# Patient Record
Sex: Female | Born: 1975
Health system: Southern US, Community
[De-identification: ages and names within clinical notes are randomized; demographics above are authoritative.]

## PROBLEM LIST (undated history)

## (undated) DIAGNOSIS — G8929 Other chronic pain: Secondary | ICD-10-CM

## (undated) DIAGNOSIS — I890 Lymphedema, not elsewhere classified: Secondary | ICD-10-CM

## (undated) DIAGNOSIS — I82409 Acute embolism and thrombosis of unspecified deep veins of unspecified lower extremity: Secondary | ICD-10-CM

## (undated) DIAGNOSIS — F329 Major depressive disorder, single episode, unspecified: Secondary | ICD-10-CM

## (undated) DIAGNOSIS — R609 Edema, unspecified: Secondary | ICD-10-CM

## (undated) DIAGNOSIS — J45909 Unspecified asthma, uncomplicated: Secondary | ICD-10-CM

## (undated) DIAGNOSIS — G43909 Migraine, unspecified, not intractable, without status migrainosus: Secondary | ICD-10-CM

## (undated) DIAGNOSIS — D689 Coagulation defect, unspecified: Secondary | ICD-10-CM

## (undated) DIAGNOSIS — D649 Anemia, unspecified: Secondary | ICD-10-CM

## (undated) DIAGNOSIS — G4700 Insomnia, unspecified: Secondary | ICD-10-CM

## (undated) DIAGNOSIS — F32A Depression, unspecified: Secondary | ICD-10-CM

## (undated) DIAGNOSIS — D6851 Activated protein C resistance: Secondary | ICD-10-CM

## (undated) DIAGNOSIS — I959 Hypotension, unspecified: Secondary | ICD-10-CM

## (undated) DIAGNOSIS — F419 Anxiety disorder, unspecified: Secondary | ICD-10-CM

## (undated) DIAGNOSIS — T7840XA Allergy, unspecified, initial encounter: Secondary | ICD-10-CM

## (undated) DIAGNOSIS — M549 Dorsalgia, unspecified: Secondary | ICD-10-CM

## (undated) DIAGNOSIS — E039 Hypothyroidism, unspecified: Secondary | ICD-10-CM

## (undated) DIAGNOSIS — T8859XA Other complications of anesthesia, initial encounter: Secondary | ICD-10-CM

## (undated) DIAGNOSIS — E559 Vitamin D deficiency, unspecified: Secondary | ICD-10-CM

## (undated) DIAGNOSIS — Z889 Allergy status to unspecified drugs, medicaments and biological substances status: Secondary | ICD-10-CM

## (undated) DIAGNOSIS — M1990 Unspecified osteoarthritis, unspecified site: Secondary | ICD-10-CM

## (undated) DIAGNOSIS — K5909 Other constipation: Secondary | ICD-10-CM

## (undated) DIAGNOSIS — G2581 Restless legs syndrome: Secondary | ICD-10-CM

## (undated) DIAGNOSIS — T4145XA Adverse effect of unspecified anesthetic, initial encounter: Secondary | ICD-10-CM

## (undated) HISTORY — DX: Unspecified osteoarthritis, unspecified site: M19.90

## (undated) HISTORY — DX: Anemia, unspecified: D64.9

## (undated) HISTORY — DX: Anxiety disorder, unspecified: F41.9

## (undated) HISTORY — DX: Other constipation: K59.09

## (undated) HISTORY — DX: Insomnia, unspecified: G47.00

## (undated) HISTORY — PX: SPINE SURGERY: SHX786

## (undated) HISTORY — DX: Other chronic pain: G89.29

## (undated) HISTORY — PX: FRACTURE SURGERY: SHX138

## (undated) HISTORY — PX: COLON SURGERY: SHX602

## (undated) HISTORY — DX: Depression, unspecified: F32.A

## (undated) HISTORY — DX: Activated protein C resistance: D68.51

## (undated) HISTORY — DX: Dorsalgia, unspecified: M54.9

## (undated) HISTORY — DX: Unspecified asthma, uncomplicated: J45.909

## (undated) HISTORY — DX: Allergy, unspecified, initial encounter: T78.40XA

## (undated) HISTORY — DX: Coagulation defect, unspecified: D68.9

## (undated) HISTORY — DX: Acute embolism and thrombosis of unspecified deep veins of unspecified lower extremity: I82.409

## (undated) HISTORY — DX: Restless legs syndrome: G25.81

## (undated) HISTORY — DX: Migraine, unspecified, not intractable, without status migrainosus: G43.909

## (undated) HISTORY — DX: Edema, unspecified: R60.9

## (undated) HISTORY — DX: Vitamin D deficiency, unspecified: E55.9

## (undated) HISTORY — PX: COSMETIC SURGERY: SHX468

## (undated) HISTORY — DX: Hypothyroidism, unspecified: E03.9

## (undated) HISTORY — PX: SMALL INTESTINE SURGERY: SHX150

## (undated) HISTORY — DX: Major depressive disorder, single episode, unspecified: F32.9

## (undated) HISTORY — PX: OTHER SURGICAL HISTORY: SHX169

## (undated) MED FILL — Medication: Fill #2 | Status: CN

---
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
## 1998-07-29 DIAGNOSIS — I890 Lymphedema, not elsewhere classified: Secondary | ICD-10-CM | POA: Insufficient documentation

## 2000-07-29 HISTORY — PX: GASTRIC BYPASS: SHX52

## 2002-07-29 HISTORY — PX: ABDOMINOPLASTY: SUR9

## 2002-07-29 HISTORY — PX: OTHER SURGICAL HISTORY: SHX169

## 2005-09-30 ENCOUNTER — Ambulatory Visit: Payer: Self-pay | Admitting: Family Medicine

## 2006-08-27 DIAGNOSIS — G43909 Migraine, unspecified, not intractable, without status migrainosus: Secondary | ICD-10-CM | POA: Insufficient documentation

## 2006-09-23 DIAGNOSIS — G2581 Restless legs syndrome: Secondary | ICD-10-CM | POA: Insufficient documentation

## 2006-12-16 ENCOUNTER — Ambulatory Visit: Payer: Self-pay | Admitting: Internal Medicine

## 2006-12-28 ENCOUNTER — Ambulatory Visit: Payer: Self-pay | Admitting: Internal Medicine

## 2007-01-22 DIAGNOSIS — D509 Iron deficiency anemia, unspecified: Secondary | ICD-10-CM | POA: Insufficient documentation

## 2007-01-27 ENCOUNTER — Ambulatory Visit: Payer: Self-pay | Admitting: Internal Medicine

## 2007-02-27 ENCOUNTER — Ambulatory Visit: Payer: Self-pay | Admitting: Internal Medicine

## 2007-06-15 DIAGNOSIS — J309 Allergic rhinitis, unspecified: Secondary | ICD-10-CM | POA: Insufficient documentation

## 2007-09-03 DIAGNOSIS — F32A Depression, unspecified: Secondary | ICD-10-CM | POA: Insufficient documentation

## 2007-09-03 DIAGNOSIS — F329 Major depressive disorder, single episode, unspecified: Secondary | ICD-10-CM | POA: Insufficient documentation

## 2007-09-03 DIAGNOSIS — G4700 Insomnia, unspecified: Secondary | ICD-10-CM | POA: Insufficient documentation

## 2007-12-25 ENCOUNTER — Ambulatory Visit: Payer: Self-pay | Admitting: Family Medicine

## 2008-07-29 HISTORY — PX: BREAST ENHANCEMENT SURGERY: SHX7

## 2008-09-01 ENCOUNTER — Ambulatory Visit: Payer: Self-pay | Admitting: Family Medicine

## 2009-08-29 ENCOUNTER — Ambulatory Visit: Payer: Self-pay | Admitting: Oncology

## 2009-09-13 ENCOUNTER — Ambulatory Visit: Payer: Self-pay | Admitting: Oncology

## 2009-09-26 ENCOUNTER — Ambulatory Visit: Payer: Self-pay | Admitting: Oncology

## 2009-10-27 ENCOUNTER — Ambulatory Visit: Payer: Self-pay | Admitting: Oncology

## 2009-12-27 ENCOUNTER — Ambulatory Visit: Payer: Self-pay | Admitting: Oncology

## 2010-01-22 ENCOUNTER — Ambulatory Visit: Payer: Self-pay | Admitting: Oncology

## 2010-01-26 ENCOUNTER — Ambulatory Visit: Payer: Self-pay | Admitting: Oncology

## 2010-02-19 ENCOUNTER — Encounter: Payer: Self-pay | Admitting: Family Medicine

## 2010-02-26 ENCOUNTER — Encounter: Payer: Self-pay | Admitting: Family Medicine

## 2010-03-29 ENCOUNTER — Ambulatory Visit: Payer: Self-pay | Admitting: Oncology

## 2010-03-29 ENCOUNTER — Encounter: Payer: Self-pay | Admitting: Family Medicine

## 2010-04-12 ENCOUNTER — Ambulatory Visit: Payer: Self-pay | Admitting: Oncology

## 2010-04-28 ENCOUNTER — Ambulatory Visit: Payer: Self-pay | Admitting: Oncology

## 2010-08-01 ENCOUNTER — Ambulatory Visit: Payer: Self-pay | Admitting: General Practice

## 2010-08-09 ENCOUNTER — Ambulatory Visit: Payer: Self-pay | Admitting: Oncology

## 2010-08-29 ENCOUNTER — Ambulatory Visit: Payer: Self-pay | Admitting: Oncology

## 2011-09-05 HISTORY — PX: BREAST SURGERY: SHX581

## 2012-07-29 HISTORY — PX: ABLATION: SHX5711

## 2012-08-03 ENCOUNTER — Ambulatory Visit: Payer: Self-pay | Admitting: Family Medicine

## 2012-10-15 DIAGNOSIS — Z9884 Bariatric surgery status: Secondary | ICD-10-CM | POA: Insufficient documentation

## 2012-10-15 DIAGNOSIS — D649 Anemia, unspecified: Secondary | ICD-10-CM | POA: Insufficient documentation

## 2012-10-15 DIAGNOSIS — M549 Dorsalgia, unspecified: Secondary | ICD-10-CM | POA: Insufficient documentation

## 2012-10-27 HISTORY — PX: BACK SURGERY: SHX140

## 2012-11-04 ENCOUNTER — Ambulatory Visit: Payer: Self-pay | Admitting: Oncology

## 2012-11-26 ENCOUNTER — Ambulatory Visit: Payer: Self-pay | Admitting: Oncology

## 2012-12-03 ENCOUNTER — Ambulatory Visit: Payer: Self-pay | Admitting: Obstetrics and Gynecology

## 2012-12-03 LAB — BASIC METABOLIC PANEL
Anion Gap: 8 (ref 7–16)
BUN: 16 mg/dL (ref 7–18)
Calcium, Total: 9 mg/dL (ref 8.5–10.1)
Chloride: 105 mmol/L (ref 98–107)
Co2: 26 mmol/L (ref 21–32)
Creatinine: 0.46 mg/dL — ABNORMAL LOW (ref 0.60–1.30)
EGFR (African American): >60
EGFR (Non-African Amer.): >60
Glucose: 77 mg/dL (ref 65–99)
Osmolality: 278 (ref 275–301)
Potassium: 4 mmol/L (ref 3.5–5.1)
Sodium: 139 mmol/L (ref 136–145)

## 2012-12-03 LAB — CBC
HCT: 33.8 % — ABNORMAL LOW (ref 35.0–47.0)
HGB: 11.1 g/dL — ABNORMAL LOW (ref 12.0–16.0)
MCH: 29.4 pg (ref 26.0–34.0)
MCHC: 32.7 g/dL (ref 32.0–36.0)
MCV: 90 fL (ref 80–100)
Platelet: 246 10*3/uL (ref 150–440)
RBC: 3.77 10*6/uL — ABNORMAL LOW (ref 3.80–5.20)
RDW: 19.2 % — ABNORMAL HIGH (ref 11.5–14.5)
WBC: 8.7 10*3/uL (ref 3.6–11.0)

## 2012-12-07 ENCOUNTER — Ambulatory Visit: Payer: Self-pay | Admitting: Obstetrics and Gynecology

## 2012-12-09 LAB — PATHOLOGY REPORT

## 2013-01-06 ENCOUNTER — Encounter: Payer: Self-pay | Admitting: Family Medicine

## 2013-01-20 ENCOUNTER — Ambulatory Visit: Payer: Self-pay | Admitting: Pain Medicine

## 2013-01-22 ENCOUNTER — Other Ambulatory Visit: Payer: Self-pay | Admitting: Pain Medicine

## 2013-01-22 LAB — SEDIMENTATION RATE: Erythrocyte Sed Rate: 14 mm/hr (ref 0–20)

## 2013-01-22 LAB — FERRITIN: Ferritin (ARMC): 7 ng/mL — ABNORMAL LOW (ref 8–388)

## 2013-01-26 ENCOUNTER — Encounter: Payer: Self-pay | Admitting: Family Medicine

## 2013-02-15 ENCOUNTER — Ambulatory Visit: Payer: Self-pay | Admitting: Pain Medicine

## 2013-02-16 ENCOUNTER — Ambulatory Visit: Payer: Self-pay | Admitting: Pain Medicine

## 2013-02-26 ENCOUNTER — Ambulatory Visit: Payer: Self-pay | Admitting: Pain Medicine

## 2013-02-26 ENCOUNTER — Encounter: Payer: Self-pay | Admitting: Family Medicine

## 2013-03-29 ENCOUNTER — Encounter: Payer: Self-pay | Admitting: Family Medicine

## 2013-04-02 ENCOUNTER — Ambulatory Visit: Payer: Self-pay | Admitting: Oncology

## 2013-04-09 ENCOUNTER — Ambulatory Visit: Payer: Self-pay | Admitting: Pain Medicine

## 2013-04-28 ENCOUNTER — Ambulatory Visit: Payer: Self-pay | Admitting: Oncology

## 2013-06-15 ENCOUNTER — Ambulatory Visit: Payer: Self-pay | Admitting: Oncology

## 2013-06-15 LAB — CBC CANCER CENTER
Basophil #: 0.1 x10 3/mm (ref 0.0–0.1)
Basophil %: 0.5 %
Eosinophil #: 0.1 x10 3/mm (ref 0.0–0.7)
Eosinophil %: 1.2 %
HCT: 39.6 % (ref 35.0–47.0)
HGB: 13 g/dL (ref 12.0–16.0)
Lymphocyte #: 3.5 x10 3/mm (ref 1.0–3.6)
Lymphocyte %: 32.5 %
MCH: 33.1 pg (ref 26.0–34.0)
MCHC: 32.8 g/dL (ref 32.0–36.0)
MCV: 101 fL — ABNORMAL HIGH (ref 80–100)
Monocyte #: 0.9 x10 3/mm (ref 0.2–0.9)
Monocyte %: 8.4 %
Neutrophil #: 6.2 x10 3/mm (ref 1.4–6.5)
Neutrophil %: 57.4 %
Platelet: 251 x10 3/mm (ref 150–440)
RBC: 3.92 10*6/uL (ref 3.80–5.20)
RDW: 14.5 % (ref 11.5–14.5)
WBC: 10.8 x10 3/mm (ref 3.6–11.0)

## 2013-06-15 LAB — IRON AND TIBC
Iron Bind.Cap.(Total): 389 ug/dL (ref 250–450)
Iron Saturation: 34 %
Iron: 133 ug/dL (ref 50–170)
Unbound Iron-Bind.Cap.: 256 ug/dL

## 2013-06-15 LAB — PROTIME-INR
INR: 0.9
Prothrombin Time: 12.8 secs (ref 11.5–14.7)

## 2013-06-15 LAB — APTT: Activated PTT: 26.9 secs (ref 23.6–35.9)

## 2013-06-15 LAB — FERRITIN: Ferritin (ARMC): 61 ng/mL (ref 8–388)

## 2013-06-28 ENCOUNTER — Ambulatory Visit: Payer: Self-pay | Admitting: Oncology

## 2013-11-11 DIAGNOSIS — F341 Dysthymic disorder: Secondary | ICD-10-CM | POA: Insufficient documentation

## 2014-11-18 NOTE — Op Note (Signed)
PATIENT NAMLovette Jones:  Wyble, Lovell MR#:  [id number] DATE OF BIRTH:  01-26-1976  DATE OF PROCEDURE:  12/07/2012  PREOPERATIVE DIAGNOSIS:    1.  Menorrhagia.   POSTOPERATIVE DIAGNOSIS: 1.  Menorrhagia.   OPERATIVE PROCEDURE: 1.  Hysteroscopy with dilation and curettage of the endometrium.  2.  NovaSure endometrial ablation.   SURGEON: Prentice DockerMartin A. Jacen Carlini, M.D.   FIRST ASSISTANT: Georgina Quintyler Roper, PA-S.   INDICATIONS: The patient is a 39 year old married white female, para 2-0-0-2, with a long history of menorrhagia, unable to use hormonal contraceptives due to side effects, presents for definitive treatment for this condition.   FINDINGS AT SURGERY:  Reveal a gynecoid pelvis. The uterus was midplane and has a normal size and shape. Uterus is mobile. There were no adnexal masses. The uterus did descend to within 2 cm of the introitus with tenaculum traction. There was a moderate soft tissue dystocia, which made use of a normal weighted speculum difficult.   DESCRIPTION OF PROCEDURE: The patient was brought to the operating room where she was placed in the supine position. The patient was then positioned in the dorsal lithotomy position using the bumblebee stirrups. This was done before the patient was put to sleep because of her chronic back pain issues. Once adequately positioned, general anesthesia was induced using LMA technique without difficulty. A Betadine perineal intravaginal prep and drape was performed in the standard fashion. A red Robinson catheter was used to drain 250 mL of urine from the bladder. A weighted speculum did not adequately stay within the vagina, and therefore a Sims retractor was utilized. The cervix was grasped with a single-tooth tenaculum on the anterior lip of the cervix. The uterus sounded to 10 cm. The cervical length was 4 cm. Hegar dilators up to a #8 JamaicaFrench were used to dilate the endocervical canal. Hysteroscopy was performed with lactated Ringer's being used as irrigant  to identify the intrauterine findings. Once photo documented, the curettage was performed with both smooth and serrated curettes. Stone polyp forceps was used to assist in removing the tissue. Tissue was sent to pathology. Repeat hysteroscopy revealed a good sampling of the endometrium. Next, the NovaSure endometrial ablation instrument was then opened and inserted in using standard technique. The cavity length was 6 cm. The with was noted to be 4.3 cm. Once completing a successful cavity test, the NovaSure instrument was engaged with performance of the ablation procedure. A total of 49 seconds of burn was noted. Upon completion of the procedure, the instrument was  removed. Repeat hysteroscopy demonstrated a good ablation effect. All instrumentation was then removed from the vagina. The patient was then awakened, mobilized and taken to the recovery room in satisfactory condition.   ESTIMATED BLOOD LOSS: Minimal.   COMPLICATIONS:  None.   All instruments, needle and sponge counts were verified as correct.     ____________________________ Prentice DockerMartin A. Herron Fero, MD mad:dmm D: 12/07/2012 13:17:49 ET T: 12/07/2012 21:47:30 ET JOB#: [id number]  cc: Daphine DeutscherMartin A. Ady Heimann, MD, <Dictator> [facility] Prentice DockerMARTIN A Rett Stehlik MD ELECTRONICALLY SIGNED 01/05/2013 15:01

## 2014-11-22 LAB — HM PAP SMEAR: HM Pap smear: NORMAL

## 2015-03-01 DIAGNOSIS — K5909 Other constipation: Secondary | ICD-10-CM | POA: Insufficient documentation

## 2015-03-01 DIAGNOSIS — M4310 Spondylolisthesis, site unspecified: Secondary | ICD-10-CM | POA: Insufficient documentation

## 2015-03-01 DIAGNOSIS — G8929 Other chronic pain: Secondary | ICD-10-CM | POA: Insufficient documentation

## 2015-03-01 DIAGNOSIS — M549 Dorsalgia, unspecified: Secondary | ICD-10-CM

## 2015-03-01 DIAGNOSIS — E559 Vitamin D deficiency, unspecified: Secondary | ICD-10-CM | POA: Insufficient documentation

## 2015-03-01 DIAGNOSIS — E7800 Pure hypercholesterolemia, unspecified: Secondary | ICD-10-CM | POA: Insufficient documentation

## 2015-03-02 ENCOUNTER — Ambulatory Visit: Payer: Self-pay | Admitting: Family Medicine

## 2015-04-04 ENCOUNTER — Other Ambulatory Visit: Payer: Self-pay | Admitting: Family Medicine

## 2015-05-26 ENCOUNTER — Ambulatory Visit (INDEPENDENT_AMBULATORY_CARE_PROVIDER_SITE_OTHER): Payer: 59 | Admitting: Family Medicine

## 2015-05-26 ENCOUNTER — Encounter: Payer: Self-pay | Admitting: Family Medicine

## 2015-05-26 VITALS — BP 108/72 | HR 75 | Temp 99.1°F | Resp 16 | Ht 66.0 in | Wt 211.0 lb

## 2015-05-26 DIAGNOSIS — E559 Vitamin D deficiency, unspecified: Secondary | ICD-10-CM

## 2015-05-26 DIAGNOSIS — R5383 Other fatigue: Secondary | ICD-10-CM | POA: Diagnosis not present

## 2015-05-26 DIAGNOSIS — E039 Hypothyroidism, unspecified: Secondary | ICD-10-CM | POA: Diagnosis not present

## 2015-05-26 DIAGNOSIS — D509 Iron deficiency anemia, unspecified: Secondary | ICD-10-CM

## 2015-05-26 DIAGNOSIS — Z23 Encounter for immunization: Secondary | ICD-10-CM | POA: Diagnosis not present

## 2015-05-26 DIAGNOSIS — Z13220 Encounter for screening for lipoid disorders: Secondary | ICD-10-CM | POA: Diagnosis not present

## 2015-05-26 MED ORDER — PROMETHAZINE HCL 25 MG PO TABS: ORAL_TABLET | ORAL | Status: DC

## 2015-05-26 NOTE — Progress Notes (Signed)
.  [facility] 

## 2015-05-26 NOTE — Progress Notes (Signed)
Patient: Michaela Jones Female    DOB: 07-Mar-1976   39 y.o.   MRN: [id number] Visit Date: 05/26/2015  Today's Provider: Lelon Huh, MD   Chief Complaint  Patient presents with  . Hypothyroidism    follow up  . Abnormal Lab    Vitamin D Deficency and Anemia Follow up   Subjective:    HPI  Hypothyroidism Follow up: Last office visit was 1 year ago and no changes were made. Current treatment includes Levothyroxine 172mg daily. Patient reports good compliance with treatment, and good tolerance.    Vitamin D deficiency Follow up: Last office visit was 1 year ago. Management at that visit includes ordering labs which showed a low Vitamin D level. Patient was advised to start Vitamin D 5,000units daily.   Anemia Follow up: She states she has been much more fatigued lately, but is under some home stresses lately, and allergies have been flaring up. .        Allergies  Allergen Reactions  . Lyrica [Pregabalin] Other (See Comments)    Patient reports neurological problem. Speech disturbances and unable to control motor skills  . Nsaids Other (See Comments)    Avoids due to gastric bypass  . Penicillins Rash   Previous Medications   ALBUTEROL (PROVENTIL) (5 MG/ML) 0.5% NEBULIZER SOLUTION    Inhale into the lungs.   B-COMPLEX TABS    Take by mouth.   BIOTIN 1000 MCG TABLET    Take by mouth.   CELECOXIB (CELEBREX) 200 MG CAPSULE    Take by mouth.   CHOLECALCIFEROL (VITAMIN D3) 5000 UNITS TABS    Take 2 tablets by mouth daily.   CLONAZEPAM (KLONOPIN) 1 MG DISINTEGRATING TABLET    Take by mouth.   CYANOCOBALAMIN 1000 MCG/ML KIT    Inject as directed.   EPINEPHRINE, ANAPHYLAXIS THERAPY AGENTS,    EPIPEN, 0.3MG/0.3ML (Injection Device) - Historical Medication  as directed (0.3 MIE/3.3IR Active   FOLIC ACID (FOLVITE) 8518MCG TABLET    Take by mouth.   LEVOTHYROXINE (SYNTHROID) 112 MCG TABLET    Take by mouth.   MISC NATURAL PRODUCTS (GLUCOSAMINE CHOND COMPLEX/MSM)  TABS    Take by mouth.   MONTELUKAST (SINGULAIR) 10 MG TABLET    Take by mouth.   MULTIPLE VITAMIN PO       OXYMORPHONE (OPANA) 5 MG TABLET    Take by mouth.   PROMETHAZINE (PHENERGAN) 25 MG TABLET    TAKE 1/2 TO 1 TABLET BY MOUTH EVERY 6 HOURS AS NEEED FOR NAUSEA    Review of Systems  Constitutional: Positive for fatigue. Negative for fever, chills and appetite change.  HENT: Positive for ear pain, postnasal drip and sore throat.   Respiratory: Negative for chest tightness and shortness of breath.   Cardiovascular: Negative for chest pain and palpitations.  Gastrointestinal: Negative for nausea, vomiting and abdominal pain.  Allergic/Immunologic: Positive for environmental allergies.  Neurological: Positive for headaches. Negative for dizziness and weakness.    Social History  Substance Use Topics  . Smoking status: Never Smoker   . Smokeless tobacco: Not on file  . Alcohol Use: Not on file   Objective:   BP 108/72 mmHg  Pulse 75  Temp(Src) 99.1 F (37.3 C) (Oral)  Resp 16  Ht _0  (1.676 m)  Wt 211 lb (95.709 kg)  BMI 34.07 kg/m2  SpO2 96%  Physical Exam   General Appearance:    Alert, cooperative, no distress  Eyes:  PERRL, conjunctiva/corneas clear, EOM's intact       Lungs:     Clear to auscultation bilaterally, respirations unlabored  Heart:    Regular rate and rhythm, 3+ bilateral LE edema  Neurologic:   Awake, alert, oriented x 3. No apparent focal neurological           defect.   Neck:   No thyromegaly or neck tenderness       Assessment & Plan:     1. Hypothyroidism, unspecified hypothyroidism type  - T4 AND TSH  2. Vitamin D deficiency  - Vit D  25 hydroxy (rtn osteoporosis monitoring)  3. Iron deficiency anemia  - CBC - Ferritin  4. Screening cholesterol level  - Lipid panel  5. Other fatigue  - Comprehensive metabolic panel  6. Need for influenza vaccination  - Flu Vaccine QUAD 36+ mos IM       Lelon Huh, MD  [facility]

## 2015-05-27 LAB — CBC
Hematocrit: 36.7 % (ref 34.0–46.6)
Hemoglobin: 12.4 g/dL (ref 11.1–15.9)
MCH: 31.9 pg (ref 26.6–33.0)
MCHC: 33.8 g/dL (ref 31.5–35.7)
MCV: 94 fL (ref 79–97)
Platelets: 236 10*3/uL (ref 150–379)
RBC: 3.89 x10E6/uL (ref 3.77–5.28)
RDW: 12.9 % (ref 12.3–15.4)
WBC: 5.8 10*3/uL (ref 3.4–10.8)

## 2015-05-27 LAB — COMPREHENSIVE METABOLIC PANEL
ALT: 19 IU/L (ref 0–32)
AST: 26 IU/L (ref 0–40)
Albumin/Globulin Ratio: 1.7 (ref 1.1–2.5)
Albumin: 4.3 g/dL (ref 3.5–5.5)
Alkaline Phosphatase: 70 IU/L (ref 39–117)
BUN/Creatinine Ratio: 17 (ref 8–20)
BUN: 10 mg/dL (ref 6–20)
Bilirubin Total: 0.3 mg/dL (ref 0.0–1.2)
CO2: 25 mmol/L (ref 18–29)
Calcium: 9 mg/dL (ref 8.7–10.2)
Chloride: 106 mmol/L (ref 97–106)
Creatinine, Ser: 0.59 mg/dL (ref 0.57–1.00)
GFR calc Af Amer: 134 mL/min/{1.73_m2} (ref >59)
GFR calc non Af Amer: 116 mL/min/{1.73_m2} (ref >59)
Globulin, Total: 2.5 g/dL (ref 1.5–4.5)
Glucose: 87 mg/dL (ref 65–99)
Potassium: 4.6 mmol/L (ref 3.5–5.2)
Sodium: 145 mmol/L — ABNORMAL HIGH (ref 136–144)
Total Protein: 6.8 g/dL (ref 6.0–8.5)

## 2015-05-27 LAB — LIPID PANEL
Chol/HDL Ratio: 2.4 ratio units (ref 0.0–4.4)
Cholesterol, Total: 249 mg/dL — ABNORMAL HIGH (ref 100–199)
HDL: 102 mg/dL (ref >39)
LDL Calculated: 117 mg/dL — ABNORMAL HIGH (ref 0–99)
Triglycerides: 148 mg/dL (ref 0–149)
VLDL Cholesterol Cal: 30 mg/dL (ref 5–40)

## 2015-05-27 LAB — T4 AND TSH
T4, Total: 4.6 ug/dL (ref 4.5–12.0)
TSH: 3.55 u[IU]/mL (ref 0.450–4.500)

## 2015-05-27 LAB — VITAMIN D 25 HYDROXY (VIT D DEFICIENCY, FRACTURES): Vit D, 25-Hydroxy: 13.9 ng/mL — ABNORMAL LOW (ref 30.0–100.0)

## 2015-05-27 LAB — FERRITIN: Ferritin: 65 ng/mL (ref 15–150)

## 2015-06-12 ENCOUNTER — Other Ambulatory Visit: Payer: Self-pay | Admitting: Family Medicine

## 2015-06-16 ENCOUNTER — Other Ambulatory Visit: Payer: Self-pay | Admitting: Family Medicine

## 2015-06-20 ENCOUNTER — Ambulatory Visit (INDEPENDENT_AMBULATORY_CARE_PROVIDER_SITE_OTHER): Payer: 59 | Admitting: Family Medicine

## 2015-06-20 ENCOUNTER — Encounter: Payer: Self-pay | Admitting: Family Medicine

## 2015-06-20 VITALS — BP 90/64 | HR 87 | Temp 99.3°F | Resp 16 | Wt 219.0 lb

## 2015-06-20 DIAGNOSIS — J069 Acute upper respiratory infection, unspecified: Secondary | ICD-10-CM | POA: Diagnosis not present

## 2015-06-20 MED ORDER — HYDROCOD POLST-CPM POLST ER 10-8 MG/5ML PO SUER: 5.0000 mL | Freq: Two times a day (BID) | ORAL | Status: DC | PRN

## 2015-06-20 MED ORDER — DOXYCYCLINE HYCLATE 100 MG PO TABS: 100.0000 mg | ORAL_TABLET | Freq: Two times a day (BID) | ORAL | Status: DC

## 2015-06-20 NOTE — Patient Instructions (Signed)
Add Decongestants to Mucinex, consider Delsym for cough. If sinuses not improving over the next two days start the antibiotic.

## 2015-06-20 NOTE — Progress Notes (Signed)
Subjective:     Patient ID: Michaela Jones, female   DOB: 1975/09/13, 39 y.o.   MRN: [id number]  HPI  Chief Complaint  Patient presents with  . Sinus Problem    Patient comes in office today with concerns of sinus pain and pressure for the past week. Patient reports productive cough of yello/bloody phlegm anhd pain in her ribs and upper back when coughing. Patient states that this morning that her right nostril was stopped up and when she tried using Flonase it burned her nose, she alsop reports crusting of eyes in the AM  States her sx started on or about 11/17. Reports fever to 100.7. Reports intermittent use of pain medication.   Review of Systems     Objective:   Physical Exam  Constitutional: She appears well-developed and well-nourished. No distress.  Ears: T.M's intact without inflammation Sinuses: mild frontal and paranasal sinus tenderness Throat: no tonsillar enlargement or exudate Neck: no cervical adenopathy Lungs: clear     Assessment:    1. Upper respiratory infection - doxycycline (VIBRA-TABS) 100 MG tablet; Take 1 tablet (100 mg total) by mouth 2 (two) times daily.  Dispense: 20 tablet; Refill: 0 - chlorpheniramine-HYDROcodone (TUSSIONEX PENNKINETIC ER) 10-8 MG/5ML SUER; Take 5 mLs by mouth every 12 (twelve) hours as needed for cough.  Dispense: 120 mL; Refill: 0    Plan:    Treat symptoms with Mucinex D and cough syrup. If sinuses not clearing in the next two days to start antibiotic.

## 2015-08-11 ENCOUNTER — Other Ambulatory Visit: Payer: Self-pay | Admitting: Family Medicine

## 2015-08-18 DIAGNOSIS — F3340 Major depressive disorder, recurrent, in remission, unspecified: Secondary | ICD-10-CM | POA: Diagnosis not present

## 2015-08-25 DIAGNOSIS — F3340 Major depressive disorder, recurrent, in remission, unspecified: Secondary | ICD-10-CM | POA: Diagnosis not present

## 2015-08-30 DIAGNOSIS — F3340 Major depressive disorder, recurrent, in remission, unspecified: Secondary | ICD-10-CM | POA: Diagnosis not present

## 2015-09-25 DIAGNOSIS — M4310 Spondylolisthesis, site unspecified: Secondary | ICD-10-CM | POA: Diagnosis not present

## 2015-09-25 DIAGNOSIS — M961 Postlaminectomy syndrome, not elsewhere classified: Secondary | ICD-10-CM | POA: Diagnosis not present

## 2015-09-25 DIAGNOSIS — Z79891 Long term (current) use of opiate analgesic: Secondary | ICD-10-CM | POA: Diagnosis not present

## 2015-10-31 ENCOUNTER — Other Ambulatory Visit: Payer: Self-pay | Admitting: Family Medicine

## 2015-11-28 ENCOUNTER — Encounter: Payer: Self-pay | Admitting: Obstetrics and Gynecology

## 2015-12-14 ENCOUNTER — Other Ambulatory Visit: Payer: Self-pay | Admitting: Family Medicine

## 2015-12-14 DIAGNOSIS — F3340 Major depressive disorder, recurrent, in remission, unspecified: Secondary | ICD-10-CM | POA: Diagnosis not present

## 2015-12-27 DIAGNOSIS — Z79891 Long term (current) use of opiate analgesic: Secondary | ICD-10-CM | POA: Diagnosis not present

## 2015-12-27 DIAGNOSIS — M961 Postlaminectomy syndrome, not elsewhere classified: Secondary | ICD-10-CM | POA: Diagnosis not present

## 2015-12-27 DIAGNOSIS — M4310 Spondylolisthesis, site unspecified: Secondary | ICD-10-CM | POA: Diagnosis not present

## 2016-01-25 DIAGNOSIS — F3340 Major depressive disorder, recurrent, in remission, unspecified: Secondary | ICD-10-CM | POA: Diagnosis not present

## 2016-02-22 DIAGNOSIS — Z79891 Long term (current) use of opiate analgesic: Secondary | ICD-10-CM | POA: Diagnosis not present

## 2016-02-22 DIAGNOSIS — M961 Postlaminectomy syndrome, not elsewhere classified: Secondary | ICD-10-CM | POA: Diagnosis not present

## 2016-02-29 ENCOUNTER — Other Ambulatory Visit: Payer: Self-pay | Admitting: Family Medicine

## 2016-03-01 ENCOUNTER — Other Ambulatory Visit: Payer: Self-pay | Admitting: Family Medicine

## 2016-03-15 ENCOUNTER — Other Ambulatory Visit: Payer: Self-pay | Admitting: Anesthesiology

## 2016-03-19 ENCOUNTER — Telehealth: Payer: Self-pay

## 2016-03-20 ENCOUNTER — Ambulatory Visit (INDEPENDENT_AMBULATORY_CARE_PROVIDER_SITE_OTHER): Payer: 59 | Admitting: Family Medicine

## 2016-03-20 ENCOUNTER — Encounter: Payer: Self-pay | Admitting: Family Medicine

## 2016-03-20 VITALS — BP 92/68 | HR 100 | Temp 98.9°F | Resp 16 | Wt 244.4 lb

## 2016-03-20 DIAGNOSIS — M545 Low back pain, unspecified: Secondary | ICD-10-CM

## 2016-03-20 DIAGNOSIS — R1011 Right upper quadrant pain: Secondary | ICD-10-CM | POA: Diagnosis not present

## 2016-03-20 MED ORDER — CYCLOBENZAPRINE HCL 5 MG PO TABS: 5.0000 mg | ORAL_TABLET | Freq: Three times a day (TID) | ORAL | 0 refills | Status: DC | PRN

## 2016-03-20 NOTE — Patient Instructions (Signed)
Continue Celebrex and pain medication. Minimize fatty food intake pending ultrasound.

## 2016-03-20 NOTE — Progress Notes (Signed)
Subjective:     Patient ID: Michaela Jones, female   DOB: Feb 21, 1976, 40 y.o.   MRN: [id number]  HPI  Chief Complaint  Patient presents with  . Back Pain    Patient comes in office today with complaints of lower back pain that began a day ago after tubing for 2 days.   . Abdominal Pain    Patient comes in office today with complaints of RUQ pain intermittent for the past 2 weeks. Patient describes pain as throbbing and states that area feels "bruised. Associated with RUQ pain patient complains of diarrhea and constipation.  Reports hx of L4-L5 fusion several years ago. Remains on narcotic medication for chronic low back pain. Has tried a TENS, topical analgesics, and chiropractory with little relief. Current pain does not radiate. Associates her abdominal pain with meals. Hx of gastric bypass.   Review of Systems     Objective:   Physical Exam  Constitutional: She appears well-nourished. No distress.  Abdominal: Soft. There is tenderness (right upper quadtrant).  Musculoskeletal:  Muscle strength in lower extremities 5/5. SLR's to 90 degrees without radiation of back pain. No specific areas of tenderness appreciated.       Assessment:    1. Left-sided low back pain without sciatica - cyclobenzaprine (FLEXERIL) 5 MG tablet; Take 1 tablet (5 mg total) by mouth 3 (three) times daily as needed for muscle spasms.  Dispense: 21 tablet; Refill: 0  2. Right upper quadrant pain - US Abdomen Limited RUQ; Future    Plan:    Continue nsaid's and pain medication. Further f/u pending ultrasound results.

## 2016-03-21 ENCOUNTER — Ambulatory Visit
Admission: RE | Admit: 2016-03-21 | Discharge: 2016-03-21 | Disposition: A | Discharge status: Home / Self Care | Payer: 59 | Source: Ambulatory Visit | Attending: Family Medicine | Admitting: Family Medicine

## 2016-03-21 ENCOUNTER — Other Ambulatory Visit: Payer: Self-pay | Admitting: Family Medicine

## 2016-03-21 DIAGNOSIS — R1011 Right upper quadrant pain: Secondary | ICD-10-CM | POA: Insufficient documentation

## 2016-03-21 DIAGNOSIS — K828 Other specified diseases of gallbladder: Secondary | ICD-10-CM | POA: Diagnosis not present

## 2016-03-22 ENCOUNTER — Encounter: Payer: Self-pay | Admitting: General Surgery

## 2016-03-25 ENCOUNTER — Other Ambulatory Visit: Payer: Self-pay | Admitting: Anesthesiology

## 2016-03-25 DIAGNOSIS — Z01818 Encounter for other preprocedural examination: Secondary | ICD-10-CM

## 2016-03-25 DIAGNOSIS — M961 Postlaminectomy syndrome, not elsewhere classified: Secondary | ICD-10-CM

## 2016-03-25 DIAGNOSIS — F3340 Major depressive disorder, recurrent, in remission, unspecified: Secondary | ICD-10-CM | POA: Diagnosis not present

## 2016-03-25 NOTE — Telephone Encounter (Signed)
Opened in error

## 2016-03-27 ENCOUNTER — Encounter: Payer: Self-pay | Admitting: Family Medicine

## 2016-03-27 ENCOUNTER — Ambulatory Visit (INDEPENDENT_AMBULATORY_CARE_PROVIDER_SITE_OTHER): Payer: 59 | Admitting: Family Medicine

## 2016-03-27 VITALS — BP 100/64 | HR 104 | Temp 101.9°F | Resp 16 | Wt 247.0 lb

## 2016-03-27 DIAGNOSIS — B349 Viral infection, unspecified: Secondary | ICD-10-CM

## 2016-03-27 DIAGNOSIS — R1011 Right upper quadrant pain: Secondary | ICD-10-CM

## 2016-03-27 MED ORDER — DOXYCYCLINE HYCLATE 100 MG PO TABS: 100.0000 mg | ORAL_TABLET | Freq: Two times a day (BID) | ORAL | 0 refills | Status: DC

## 2016-03-27 MED ORDER — HYDROCODONE-HOMATROPINE 5-1.5 MG/5ML PO SYRP: ORAL_SOLUTION | ORAL | 0 refills | Status: DC

## 2016-03-27 NOTE — Patient Instructions (Signed)
We will call you with the lab results and if we have rescheduled your g.i. Appointment.

## 2016-03-27 NOTE — Progress Notes (Signed)
Subjective:     Patient ID: Michaela Jones, female   DOB: 06/21/1976, 40 y.o.   MRN: [id number]  HPI  Chief Complaint  Patient presents with  . Sinus Problem    Patient comes in office today with complaints of sinus pain and pressure for the past two days and non produtive cough  . Abdominal Pain    Patient has concerns of RUQ pain that has been persistent, paitent describes pain as sharp. Associated symptoms include diarrhea, nausea and fever high of 102.7  Reports headache, scratchy throat, and high fever. Has possibility of tick exposure but has not found one on her. Recurrent RUQ pain is pending surgical evaluation 04/04/16. G.B. Ultrasound with ? gallbladder sludge or possible mass.   Review of Systems     Objective:   Physical Exam  Constitutional: She appears well-developed and well-nourished. No distress.  Abdominal:  Moderate tenderness with guarding over RUQ  Ears: T.M's intact without inflammation Throat: no tonsillar enlargement or exudate Neck: no cervical adenopathy Lungs: clear     Assessment:    1. Right upper quadrant pain: Will try to push up time of surgery evaluation - CBC with Differential/Platelet - Comprehensive metabolic panel  2. Viral syndrome: cover for tick fever - CBC with Differential/Platelet - Comprehensive metabolic panel - doxycycline (VIBRA-TABS) 100 MG tablet; Take 1 tablet (100 mg total) by mouth 2 (two) times daily.  Dispense: 20 tablet; Refill: 0 - HYDROcodone-homatropine (HYCODAN) 5-1.5 MG/5ML syrup; 5 ml 4-6 hours as needed for cough  Dispense: 240 mL; Refill: 0    Plan:    Further f/u pending labs.

## 2016-03-28 LAB — COMPREHENSIVE METABOLIC PANEL
ALT: 18 IU/L (ref 0–32)
AST: 20 IU/L (ref 0–40)
Albumin/Globulin Ratio: 1.7 (ref 1.2–2.2)
Albumin: 4 g/dL (ref 3.5–5.5)
Alkaline Phosphatase: 74 IU/L (ref 39–117)
BUN/Creatinine Ratio: 12 (ref 9–23)
BUN: 8 mg/dL (ref 6–24)
Bilirubin Total: 0.3 mg/dL (ref 0.0–1.2)
CO2: 20 mmol/L (ref 18–29)
Calcium: 8.4 mg/dL — ABNORMAL LOW (ref 8.7–10.2)
Chloride: 101 mmol/L (ref 96–106)
Creatinine, Ser: 0.65 mg/dL (ref 0.57–1.00)
GFR calc Af Amer: 128 mL/min/{1.73_m2} (ref >59)
GFR calc non Af Amer: 111 mL/min/{1.73_m2} (ref >59)
Globulin, Total: 2.3 g/dL (ref 1.5–4.5)
Glucose: 102 mg/dL — ABNORMAL HIGH (ref 65–99)
Potassium: 3.7 mmol/L (ref 3.5–5.2)
Sodium: 137 mmol/L (ref 134–144)
Total Protein: 6.3 g/dL (ref 6.0–8.5)

## 2016-03-28 LAB — CBC WITH DIFFERENTIAL/PLATELET
Basophils Absolute: 0 10*3/uL (ref 0.0–0.2)
Basos: 0 %
EOS (ABSOLUTE): 0.1 10*3/uL (ref 0.0–0.4)
Eos: 1 %
Hematocrit: 33.2 % — ABNORMAL LOW (ref 34.0–46.6)
Hemoglobin: 10.9 g/dL — ABNORMAL LOW (ref 11.1–15.9)
Immature Grans (Abs): 0 10*3/uL (ref 0.0–0.1)
Immature Granulocytes: 0 %
Lymphocytes Absolute: 1.1 10*3/uL (ref 0.7–3.1)
Lymphs: 13 %
MCH: 30.2 pg (ref 26.6–33.0)
MCHC: 32.8 g/dL (ref 31.5–35.7)
MCV: 92 fL (ref 79–97)
Monocytes Absolute: 0.5 10*3/uL (ref 0.1–0.9)
Monocytes: 6 %
Neutrophils Absolute: 6.9 10*3/uL (ref 1.4–7.0)
Neutrophils: 80 %
Platelets: 275 10*3/uL (ref 150–379)
RBC: 3.61 x10E6/uL — ABNORMAL LOW (ref 3.77–5.28)
RDW: 14.3 % (ref 12.3–15.4)
WBC: 8.6 10*3/uL (ref 3.4–10.8)

## 2016-04-04 ENCOUNTER — Encounter: Payer: Self-pay | Admitting: General Surgery

## 2016-04-04 ENCOUNTER — Ambulatory Visit (INDEPENDENT_AMBULATORY_CARE_PROVIDER_SITE_OTHER): Payer: 59 | Admitting: General Surgery

## 2016-04-04 VITALS — BP 128/74 | HR 78 | Temp 97.3°F | Resp 12 | Ht 66.0 in | Wt 241.0 lb

## 2016-04-04 DIAGNOSIS — K810 Acute cholecystitis: Secondary | ICD-10-CM

## 2016-04-04 DIAGNOSIS — K811 Chronic cholecystitis: Secondary | ICD-10-CM | POA: Diagnosis not present

## 2016-04-04 NOTE — Progress Notes (Signed)
Patient ID: Michaela Jones, female   DOB: 09-10-1975, 40 y.o.   MRN: [id number]  Chief Complaint  Patient presents with  . Abdominal Pain    HPI Michaela Jones is a 40 y.o. female here today for a evaluation of right upper abdominal pain. She states the pain has been going on for two months now.She describes the pain as sharp and having some diarrhea and nausea. Greasy foods and salads trigger the pain, happens about 30 minutes after meals. Her fever has been up to 102.7 last week. She saw her PCP during this time, Venetia Night who did not feel this was related to her biliary issues. She had a ultrasound done on 03/21/16.   I personally reviewed the patient's history.  HPI  Past Medical History:  Diagnosis Date  . Anemia   . Chronic back pain   . Constipation, chronic   . Depression   . Edema   . Hypothyroid   . Insomnia   . Migraine   . Restless leg syndrome   . Vitamin D deficiency     Past Surgical History:  Procedure Laterality Date  . BACK SURGERY  10/2012  . BREAST ENHANCEMENT SURGERY  07/2008  . BREAST SURGERY  09/05/2011   removal of implants  . GASTRIC BYPASS  2002  . Lower body  01/2008, 03/2008   lift legs and thighs  . tummy tuck  2004    Family History  Problem Relation Age of Onset  . Pulmonary embolism Mother   . Depression Mother   . Asthma Mother   . Heart attack Father   . Cancer Father     Social History Social History  Substance Use Topics  . Smoking status: Never Smoker  . Smokeless tobacco: Not on file  . Alcohol use Not on file    Allergies  Allergen Reactions  . Lyrica [Pregabalin] Other (See Comments)    Patient reports neurological problem. Speech disturbances and unable to control motor skills  . Nsaids Other (See Comments)    Avoids due to gastric bypass  . Penicillins Rash    Current Outpatient Prescriptions  Medication Sig Dispense Refill  . albuterol (PROVENTIL) (5 MG/ML) 0.5% nebulizer solution Inhale into the  lungs.    . AMITIZA 24 MCG capsule TAKE 1 CAPSULE BY MOUTH TWICE A DAY 60 capsule 5  . B-Complex TABS Take by mouth.    . Biotin 1000 MCG tablet Take by mouth.    . busPIRone (BUSPAR) 15 MG tablet   1  . celecoxib (CELEBREX) 200 MG capsule Take by mouth.    . Cholecalciferol (VITAMIN D3) 5000 UNITS TABS Take 2 tablets by mouth daily.    . clonazePAM (KLONOPIN) 1 MG tablet   0  . Cyanocobalamin 1000 MCG/ML KIT Inject as directed.    . doxycycline (VIBRA-TABS) 100 MG tablet Take 1 tablet (100 mg total) by mouth 2 (two) times daily. 20 tablet 0  . EPINEPHRINE, ANAPHYLAXIS THERAPY AGENTS, EPIPEN, 0.3MG/0.3ML (Injection Device) - Historical Medication  as directed (0.3 MG/0.3ML) Active    . folic acid (FOLVITE) 599 MCG tablet Take by mouth.    Marland Kitchen HYDROcodone-homatropine (HYCODAN) 5-1.5 MG/5ML syrup 5 ml 4-6 hours as needed for cough 240 mL 0  . levothyroxine (SYNTHROID) 112 MCG tablet Take by mouth.    . Misc Natural Products (GLUCOSAMINE CHOND COMPLEX/MSM) TABS Take by mouth.    . MULTIPLE VITAMIN PO     . [START ON 04/24/2016] oxymorphone (OPANA) 10 MG  tablet Take by mouth.    . promethazine (PHENERGAN) 25 MG tablet TAKE 1/2 TO 1 TABLET BY MOUTH EVERY 6 HOURS AS NEEED FOR NAUSEA 10 tablet 6  . sertraline (ZOLOFT) 100 MG tablet   0  . VOLTAREN 1 % GEL APPLY 4 GRAMS TO AFFECTED KNEE UP TO 4 TIMES A DAY AS NEEDED 400 g 1   No current facility-administered medications for this visit.     Review of Systems Review of Systems  Constitutional: Negative.   Respiratory: Negative.   Cardiovascular: Negative.   Gastrointestinal: Positive for abdominal pain, diarrhea and nausea.    Blood pressure 128/74, pulse 78, temperature 97.3 F (36.3 C), temperature source Oral, resp. rate 12, height 5' 6"  (1.676 m), weight 241 lb (109.3 kg).  Physical Exam Physical Exam  Constitutional: She is oriented to person, place, and time. She appears well-developed and well-nourished.  Eyes: Conjunctivae are  normal. No scleral icterus.  Neck: Neck supple.  Cardiovascular: Normal rate, regular rhythm and normal heart sounds.   Pulmonary/Chest: Effort normal and breath sounds normal.  Abdominal: Soft. Normal appearance and bowel sounds are normal. There is no hepatomegaly. There is no tenderness.    Musculoskeletal:       Legs: Lymphadenopathy:    She has no cervical adenopathy.  Neurological: She is alert and oriented to person, place, and time.  Skin: Skin is warm and dry.    Data Reviewed Abdominal ultrasound of 03/21/2016 reviewed. Non mobile echodensity is noted in the gallbladder. This is most likely tumefactive sludge. Gallbladder mass cannot be excluded. No gallstones noted. Gallbladder wall is slightly thickened at 3.3 mm. Cholecystitis cannot be excluded.  Common bile duct:  Diameter: 4.2 mm IMPRESSION: Findings most consistent with tumefactive sludge. Gallbladder mass cannot be completely excluded. Gallbladder wall is slightly thickened. Cholecystitis cannot be excluded. CBC and comprehensive metabolic panel dated 90/24/0973 reviewed.  This study was independently reviewed.  2009 ultrasound was normal.  Normochromic anemia with a hemoglobin of 10.9. Normal platelet and white blood cell count. Normal liver function studies.  PCP note of 03/27/2016 reviewed.   Assessment    Clinical history consistent with cholecystitis. Sludge versus polyp versus cholelithiasis.  Idiopathic lower extremity lymphedema.    Plan       Laparoscopic Cholecystectomy with Intraoperative Cholangiogram. The procedure, including it's potential risks and complications (including but not limited to infection, bleeding, injury to intra-abdominal organs or bile ducts, bile leak, poor cosmetic result, sepsis and death) were discussed with the patient in detail. Non-operative options, including their inherent risks (acute calculous cholecystitis with possible/ choledocholithiasis or gallstone  pancreatitis, with the risk of ascending cholangitis, sepsis, and death) were discussed as well. The patient expressed and understanding of what we discussed and wishes to proceed with laparoscopic cholecystectomy. The patient further understands that if it is technically not possible, or it is unsafe to proceed laparoscopically, that I will convert to an open cholecystectomy.  The patient will bring her compressive garments to the hospital for use during the procedure as it is unlikely standard SCDs will fit.  The patient is scheduled for surgery at [facility] on 04/16/16. She will pre admit by phone. The patient is aware of date and instructions.   This information has been scribed by Gaspar Cola CMA.   Robert Bellow 04/06/2016, 9:28 AM

## 2016-04-04 NOTE — Patient Instructions (Addendum)
Laparoscopic Cholecystectomy Laparoscopic cholecystectomy is surgery to remove the gallbladder. The gallbladder is located in the upper right part of the abdomen, behind the liver. It is a storage sac for bile, which is produced in the liver. Bile aids in the digestion and absorption of fats. Cholecystectomy is often done for inflammation of the gallbladder (cholecystitis). This condition is usually caused by a buildup of gallstones (cholelithiasis) in the gallbladder. Gallstones can block the flow of bile, and that can result in inflammation and pain. In severe cases, emergency surgery may be required. If emergency surgery is not required, you will have time to prepare for the procedure. Laparoscopic surgery is an alternative to open surgery. Laparoscopic surgery has a shorter recovery time. Your common bile duct may also need to be examined during the procedure. If stones are found in the common bile duct, they may be removed. LET YOUR HEALTH CARE PROVIDER KNOW ABOUT:  Any allergies you have.  All medicines you are taking, including vitamins, herbs, eye drops, creams, and over-the-counter medicines.  Previous problems you or members of your family have had with the use of anesthetics.  Any blood disorders you have.  Previous surgeries you have had.  Any medical conditions you have. RISKS AND COMPLICATIONS Generally, this is a safe procedure. However, problems may occur, including:  Infection.  Bleeding.  Allergic reactions to medicines.  Damage to other structures or organs.  A stone remaining in the common bile duct.  A bile leak from the cyst duct that is clipped when your gallbladder is removed.  The need to convert to open surgery, which requires a larger incision in the abdomen. This may be necessary if your surgeon thinks that it is not safe to continue with a laparoscopic procedure. BEFORE THE PROCEDURE  Ask your health care provider about:  Changing or stopping your  regular medicines. This is especially important if you are taking diabetes medicines or blood thinners.  Taking medicines such as aspirin and ibuprofen. These medicines can thin your blood. Do not take these medicines before your procedure if your health care provider instructs you not to.  Follow instructions from your health care provider about eating or drinking restrictions.  Let your health care provider know if you develop a cold or an infection before surgery.  Plan to have someone take you home after the procedure.  Ask your health care provider how your surgical site will be marked or identified.  You may be given antibiotic medicine to help prevent infection. PROCEDURE  To reduce your risk of infection:  Your health care team will wash or sanitize their hands.  Your skin will be washed with soap.  An IV tube may be inserted into one of your veins.  You will be given a medicine to make you fall asleep (general anesthetic).  A breathing tube will be placed in your mouth.  The surgeon will make several small cuts (incisions) in your abdomen.  A thin, lighted tube (laparoscope) that has a tiny camera on the end will be inserted through one of the small incisions. The camera on the laparoscope will send a picture to a TV screen (monitor) in the operating room. This will give the surgeon a good view inside your abdomen.  A gas will be pumped into your abdomen. This will expand your abdomen to give the surgeon more room to perform the surgery.  Other tools that are needed for the procedure will be inserted through the other incisions. The gallbladder will   be removed through one of the incisions.  After your gallbladder has been removed, the incisions will be closed with stitches (sutures), staples, or skin glue.  Your incisions may be covered with a bandage (dressing). The procedure may vary among health care providers and hospitals. AFTER THE PROCEDURE  Your blood  pressure, heart rate, breathing rate, and blood oxygen level will be monitored often until the medicines you were given have worn off.  You will be given medicines as needed to control your pain.   This information is not intended to replace advice given to you by your health care provider. Make sure you discuss any questions you have with your health care provider.   Document Released: 07/15/2005 Document Revised: 04/05/2015 Document Reviewed: 02/24/2013 Elsevier Interactive Patient Education [employer]2016 [employer].  [facility]. She will pre admit by phone. The patient is aware of date and instructions.

## 2016-04-05 ENCOUNTER — Ambulatory Visit: Payer: 59

## 2016-04-06 DIAGNOSIS — K811 Chronic cholecystitis: Secondary | ICD-10-CM | POA: Insufficient documentation

## 2016-04-08 ENCOUNTER — Other Ambulatory Visit: Payer: Self-pay | Admitting: Family Medicine

## 2016-04-10 ENCOUNTER — Other Ambulatory Visit: Payer: Self-pay | Admitting: Family Medicine

## 2016-04-10 ENCOUNTER — Inpatient Hospital Stay
Admission: RE | Admit: 2016-04-10 | Discharge: 2016-04-10 | Disposition: A | Discharge status: Home / Self Care | Payer: 59 | Source: Ambulatory Visit

## 2016-04-10 DIAGNOSIS — M545 Low back pain, unspecified: Secondary | ICD-10-CM

## 2016-04-10 HISTORY — DX: Lymphedema, not elsewhere classified: I89.0

## 2016-04-10 NOTE — Pre-Procedure Instructions (Signed)
Patient to bring her own compression garments for lower extremities

## 2016-04-11 ENCOUNTER — Encounter: Payer: Self-pay | Admitting: *Deleted

## 2016-04-11 NOTE — Telephone Encounter (Signed)
Muscle relaxants are for skeletal muscle and will not help gallbladder spasm. Minimize fat intake with meals and that will help lessen gallbladder spasms. I would be receptive to any pharmaceutical advice your husband might have for you.

## 2016-04-12 ENCOUNTER — Encounter: Payer: Self-pay | Admitting: *Deleted

## 2016-04-12 NOTE — Patient Instructions (Signed)
  Your procedure is scheduled on: 04-16-16 Report to Same Day Surgery 2nd floor medical mall To find out your arrival time please call [phone] between 1PM - 3PM on 04-15-16  Remember: Instructions that are not followed completely may result in serious medical risk, up to and including death, or upon the discretion of your surgeon and anesthesiologist your surgery may need to be rescheduled.    _x___ 1. Do not eat food or drink liquids after midnight. No gum chewing or hard candies.     __x__ 2. No Alcohol for 24 hours before or after surgery.   __x__3. No Smoking for 24 prior to surgery.   ____  4. Bring all medications with you on the day of surgery if instructed.    __x__ 5. Notify your doctor if there is any change in your medical condition     (cold, fever, infections).     Do not wear jewelry, make-up, hairpins, clips or nail polish.  Do not wear lotions, powders, or perfumes. You may wear deodorant.  Do not shave 48 hours prior to surgery. Men may shave face and neck.  Do not bring valuables to the hospital.    [facility] not [facility] for any belongings or valuables.               Contacts, dentures or bridgework may not be worn into surgery.  Leave your suitcase in the car. After surgery it may be brought to your room.  For patients admitted to the hospital, discharge time is determined by your treatment team.   Patients discharged the day of surgery will not be allowed to drive home.    Please read over the following fact sheets that you were given:   [facility] Preparing for Surgery and or MRSA Information   _x___ Take these medicines the morning of surgery with A SIP OF WATER:    1. levothyroxine  2. Oxymorphone  3.  4.  5.  6.  ____ Fleet Enema (as directed)   ____ Use CHG Soap or sage wipes as directed on instruction sheet   ____ Use inhalers on the day of surgery and bring to hospital day of surgery  ____ Stop metformin 2 days prior to  surgery    ____ Take 1/2 of usual insulin dose the night before surgery and none on the morning of surgery.   ____ Stop aspirin or coumadin, or plavix  _x__ Stop Anti-inflammatories such as Advil, Aleve, Ibuprofen, Motrin, Naproxen,          Naprosyn, Goodies powders or aspirin products-CELEBREX OK TO CONTINUE IF NEEDED- Ok to take Tylenol OR OXYMORPHONE   ____ Stop supplements until after surgery.    ____ Bring C-Pap to the hospital.

## 2016-04-12 NOTE — Telephone Encounter (Signed)
Attempted to contact patient unable to reach her by voicemail, voicemail box is full. Will try contacting patient again at a later time. KW

## 2016-04-15 ENCOUNTER — Other Ambulatory Visit: Payer: Self-pay | Admitting: General Surgery

## 2016-04-15 DIAGNOSIS — I890 Lymphedema, not elsewhere classified: Secondary | ICD-10-CM

## 2016-04-15 NOTE — Telephone Encounter (Signed)
Spoke with husband on the phone he states that surgeon suggested last week that they request med from PCP based of where patient was having spasms. Husband states that patient got by through the weekend pretty well and is scheduled to have surgery 04/16/16 so Rx is no longer needed. KW

## 2016-04-15 NOTE — Pre-Procedure Instructions (Signed)
When doing pts phone interview for surgery pt stated that she does not have her compressive garments for her LE lymphedema anymore and she had told Dr Lemar LivingsByrnett she did. He wanted her to bring them in and have during her surgery.  Pt states she threw them away and had  forgotten she had done so.I called Caryl-Lyn at Dr Arvella NighByrnetts office and informed her of this so she could relay the info to Dr Lemar LivingsByrnett since that was going to be her VTE prophylaxis during her surgery. She will relay this to Dr Lemar LivingsByrnett

## 2016-04-16 ENCOUNTER — Ambulatory Visit: Payer: 59

## 2016-04-16 ENCOUNTER — Ambulatory Visit: Payer: 59 | Admitting: Certified Registered"

## 2016-04-16 ENCOUNTER — Encounter: Payer: Self-pay | Admitting: *Deleted

## 2016-04-16 ENCOUNTER — Ambulatory Visit
Admission: RE | Admit: 2016-04-16 | Discharge: 2016-04-16 | Disposition: A | Discharge status: Home / Self Care | Payer: 59 | Source: Ambulatory Visit | Attending: General Surgery | Admitting: General Surgery

## 2016-04-16 ENCOUNTER — Encounter
Admission: RE | Disposition: A | Discharge status: Home / Self Care | Payer: Self-pay | Source: Ambulatory Visit | Attending: General Surgery

## 2016-04-16 DIAGNOSIS — Z809 Family history of malignant neoplasm, unspecified: Secondary | ICD-10-CM | POA: Insufficient documentation

## 2016-04-16 DIAGNOSIS — Z9884 Bariatric surgery status: Secondary | ICD-10-CM | POA: Insufficient documentation

## 2016-04-16 DIAGNOSIS — K819 Cholecystitis, unspecified: Secondary | ICD-10-CM | POA: Diagnosis not present

## 2016-04-16 DIAGNOSIS — G4700 Insomnia, unspecified: Secondary | ICD-10-CM | POA: Insufficient documentation

## 2016-04-16 DIAGNOSIS — E039 Hypothyroidism, unspecified: Secondary | ICD-10-CM | POA: Diagnosis not present

## 2016-04-16 DIAGNOSIS — Z419 Encounter for procedure for purposes other than remedying health state, unspecified: Secondary | ICD-10-CM

## 2016-04-16 DIAGNOSIS — K5909 Other constipation: Secondary | ICD-10-CM | POA: Diagnosis not present

## 2016-04-16 DIAGNOSIS — G43909 Migraine, unspecified, not intractable, without status migrainosus: Secondary | ICD-10-CM | POA: Insufficient documentation

## 2016-04-16 DIAGNOSIS — G8929 Other chronic pain: Secondary | ICD-10-CM | POA: Insufficient documentation

## 2016-04-16 DIAGNOSIS — D649 Anemia, unspecified: Secondary | ICD-10-CM | POA: Diagnosis not present

## 2016-04-16 DIAGNOSIS — Z8249 Family history of ischemic heart disease and other diseases of the circulatory system: Secondary | ICD-10-CM | POA: Diagnosis not present

## 2016-04-16 DIAGNOSIS — G2581 Restless legs syndrome: Secondary | ICD-10-CM | POA: Insufficient documentation

## 2016-04-16 DIAGNOSIS — M549 Dorsalgia, unspecified: Secondary | ICD-10-CM | POA: Insufficient documentation

## 2016-04-16 DIAGNOSIS — F329 Major depressive disorder, single episode, unspecified: Secondary | ICD-10-CM | POA: Insufficient documentation

## 2016-04-16 DIAGNOSIS — K8010 Calculus of gallbladder with chronic cholecystitis without obstruction: Secondary | ICD-10-CM | POA: Diagnosis present

## 2016-04-16 DIAGNOSIS — Z880 Allergy status to penicillin: Secondary | ICD-10-CM | POA: Diagnosis not present

## 2016-04-16 DIAGNOSIS — Z825 Family history of asthma and other chronic lower respiratory diseases: Secondary | ICD-10-CM | POA: Diagnosis not present

## 2016-04-16 DIAGNOSIS — E559 Vitamin D deficiency, unspecified: Secondary | ICD-10-CM | POA: Insufficient documentation

## 2016-04-16 DIAGNOSIS — Z888 Allergy status to other drugs, medicaments and biological substances status: Secondary | ICD-10-CM | POA: Diagnosis not present

## 2016-04-16 DIAGNOSIS — Z818 Family history of other mental and behavioral disorders: Secondary | ICD-10-CM | POA: Insufficient documentation

## 2016-04-16 DIAGNOSIS — K811 Chronic cholecystitis: Secondary | ICD-10-CM | POA: Diagnosis not present

## 2016-04-16 DIAGNOSIS — Z79899 Other long term (current) drug therapy: Secondary | ICD-10-CM | POA: Insufficient documentation

## 2016-04-16 DIAGNOSIS — K8020 Calculus of gallbladder without cholecystitis without obstruction: Secondary | ICD-10-CM | POA: Diagnosis not present

## 2016-04-16 DIAGNOSIS — K828 Other specified diseases of gallbladder: Secondary | ICD-10-CM | POA: Diagnosis not present

## 2016-04-16 HISTORY — DX: Hypotension, unspecified: I95.9

## 2016-04-16 HISTORY — DX: Other complications of anesthesia, initial encounter: T88.59XA

## 2016-04-16 HISTORY — DX: Adverse effect of unspecified anesthetic, initial encounter: T41.45XA

## 2016-04-16 HISTORY — PX: CHOLECYSTECTOMY: SHX55

## 2016-04-16 HISTORY — DX: Allergy status to unspecified drugs, medicaments and biological substances: Z88.9

## 2016-04-16 LAB — POCT PREGNANCY, URINE: Preg Test, Ur: NEGATIVE

## 2016-04-16 SURGERY — LAPAROSCOPIC CHOLECYSTECTOMY WITH INTRAOPERATIVE CHOLANGIOGRAM
Anesthesia: General | Wound class: Clean Contaminated

## 2016-04-16 MED ORDER — SUGAMMADEX SODIUM 500 MG/5ML IV SOLN
INTRAVENOUS | Status: DC | PRN
  Administered 2016-04-16: 218.6 mg via INTRAVENOUS

## 2016-04-16 MED ORDER — FENTANYL CITRATE (PF) 100 MCG/2ML IJ SOLN
25.0000 ug | INTRAMUSCULAR | Status: DC | PRN
  Administered 2016-04-16 (×3): 50 ug via INTRAVENOUS

## 2016-04-16 MED ORDER — LIDOCAINE HCL (CARDIAC) 20 MG/ML IV SOLN
INTRAVENOUS | Status: DC | PRN
  Administered 2016-04-16: 100 mg via INTRAVENOUS

## 2016-04-16 MED ORDER — DEXAMETHASONE SODIUM PHOSPHATE 10 MG/ML IJ SOLN
INTRAMUSCULAR | Status: DC | PRN
  Administered 2016-04-16: 5 mg via INTRAVENOUS

## 2016-04-16 MED ORDER — HYDROCODONE-ACETAMINOPHEN 5-325 MG PO TABS
ORAL_TABLET | ORAL | Status: AC
  Filled 2016-04-16: qty 1

## 2016-04-16 MED ORDER — PROPOFOL 10 MG/ML IV BOLUS
INTRAVENOUS | Status: DC | PRN
  Administered 2016-04-16: 200 mg via INTRAVENOUS

## 2016-04-16 MED ORDER — ONDANSETRON HCL 4 MG/2ML IJ SOLN
INTRAMUSCULAR | Status: DC | PRN
  Administered 2016-04-16: 4 mg via INTRAVENOUS

## 2016-04-16 MED ORDER — FENTANYL CITRATE (PF) 100 MCG/2ML IJ SOLN
INTRAMUSCULAR | Status: DC | PRN
  Administered 2016-04-16: 100 ug via INTRAVENOUS
  Administered 2016-04-16 (×2): 50 ug via INTRAVENOUS

## 2016-04-16 MED ORDER — ROCURONIUM BROMIDE 100 MG/10ML IV SOLN
INTRAVENOUS | Status: DC | PRN
  Administered 2016-04-16: 5 mg via INTRAVENOUS
  Administered 2016-04-16: 40 mg via INTRAVENOUS
  Administered 2016-04-16 (×2): 10 mg via INTRAVENOUS

## 2016-04-16 MED ORDER — ACETAMINOPHEN 10 MG/ML IV SOLN: INTRAVENOUS | Status: DC | PRN

## 2016-04-16 MED ORDER — PROMETHAZINE HCL 25 MG/ML IJ SOLN: 6.2500 mg | INTRAMUSCULAR | Status: DC | PRN

## 2016-04-16 MED ORDER — SODIUM CHLORIDE 0.9 % IJ SOLN
INTRAMUSCULAR | Status: AC
  Filled 2016-04-16: qty 50

## 2016-04-16 MED ORDER — HYDROMORPHONE HCL 1 MG/ML IJ SOLN
INTRAMUSCULAR | Status: AC
  Administered 2016-04-16: 0.5 mg via INTRAVENOUS
  Filled 2016-04-16: qty 1

## 2016-04-16 MED ORDER — HYDROCODONE-ACETAMINOPHEN 5-325 MG PO TABS: 1.0000 | ORAL_TABLET | ORAL | 0 refills | Status: DC | PRN

## 2016-04-16 MED ORDER — FENTANYL CITRATE (PF) 100 MCG/2ML IJ SOLN
INTRAMUSCULAR | Status: AC
  Administered 2016-04-16: 50 ug via INTRAVENOUS
  Filled 2016-04-16: qty 2

## 2016-04-16 MED ORDER — DIPHENHYDRAMINE HCL 50 MG/ML IJ SOLN
INTRAMUSCULAR | Status: DC | PRN
  Administered 2016-04-16: 6.25 mg via INTRAVENOUS

## 2016-04-16 MED ORDER — FENTANYL CITRATE (PF) 100 MCG/2ML IJ SOLN
INTRAMUSCULAR | Status: AC
  Filled 2016-04-16: qty 2

## 2016-04-16 MED ORDER — HYDROCODONE-ACETAMINOPHEN 5-325 MG PO TABS
1.0000 | ORAL_TABLET | ORAL | Status: DC | PRN
  Administered 2016-04-16: 1 via ORAL

## 2016-04-16 MED ORDER — HYDROMORPHONE HCL 1 MG/ML IJ SOLN
0.2500 mg | INTRAMUSCULAR | Status: DC | PRN
  Administered 2016-04-16 (×4): 0.5 mg via INTRAVENOUS

## 2016-04-16 MED ORDER — LACTATED RINGERS IV SOLN
INTRAVENOUS | Status: DC
  Administered 2016-04-16 (×2): via INTRAVENOUS

## 2016-04-16 MED ORDER — ESMOLOL HCL 100 MG/10ML IV SOLN
INTRAVENOUS | Status: DC | PRN
  Administered 2016-04-16: 20 mg via INTRAVENOUS

## 2016-04-16 MED ORDER — ACETAMINOPHEN 10 MG/ML IV SOLN
INTRAVENOUS | Status: DC | PRN
  Administered 2016-04-16: 1000 mg via INTRAVENOUS

## 2016-04-16 MED ORDER — ACETAMINOPHEN 10 MG/ML IV SOLN
INTRAVENOUS | Status: AC
  Filled 2016-04-16: qty 100

## 2016-04-16 MED ORDER — MIDAZOLAM HCL 2 MG/2ML IJ SOLN
INTRAMUSCULAR | Status: DC | PRN
  Administered 2016-04-16: 2 mg via INTRAVENOUS

## 2016-04-16 SURGICAL SUPPLY — 39 items
APPLIER CLIP ROT 10 11.4 M/L (STAPLE) ×2
APR CLP MED LRG 11.4X10 (STAPLE) ×1
BLADE SURG 11 STRL SS SAFETY (MISCELLANEOUS) ×2 IMPLANT
CANISTER SUCT 1200ML W/VALVE (MISCELLANEOUS) ×2 IMPLANT
CANNULA DILATOR 10 W/SLV (CANNULA) ×2 IMPLANT
CANNULA DILATOR 5 W/SLV (CANNULA) ×4 IMPLANT
CATH CHOLANG 76X19 KUMAR (CATHETERS) ×2 IMPLANT
CHLORAPREP W/TINT 26ML (MISCELLANEOUS) ×2 IMPLANT
CLIP APPLIE ROT 10 11.4 M/L (STAPLE) ×1 IMPLANT
CONRAY 60ML FOR OR (MISCELLANEOUS) ×2 IMPLANT
DISSECTOR KITTNER STICK (MISCELLANEOUS) ×1 IMPLANT
DISSECTORS/KITTNER STICK (MISCELLANEOUS)
DRAPE SHEET LG 3/4 BI-LAMINATE (DRAPES) ×2 IMPLANT
DRESSING TELFA 4X3 1S ST N-ADH (GAUZE/BANDAGES/DRESSINGS) ×2 IMPLANT
DRSG TEGADERM 2-3/8X2-3/4 SM (GAUZE/BANDAGES/DRESSINGS) ×8 IMPLANT
ELECT REM PT RETURN 9FT ADLT (ELECTROSURGICAL) ×2
ELECTRODE REM PT RTRN 9FT ADLT (ELECTROSURGICAL) ×1 IMPLANT
ENDOPOUCH RETRIEVER 10 (MISCELLANEOUS) ×1 IMPLANT
GLOVE BIO SURGEON STRL SZ7.5 (GLOVE) ×2 IMPLANT
GLOVE INDICATOR 8.0 STRL GRN (GLOVE) ×2 IMPLANT
GOWN STRL REUS W/ TWL LRG LVL3 (GOWN DISPOSABLE) ×3 IMPLANT
GOWN STRL REUS W/TWL LRG LVL3 (GOWN DISPOSABLE) ×6
IRRIGATION STRYKERFLOW (MISCELLANEOUS) ×1 IMPLANT
IRRIGATOR STRYKERFLOW (MISCELLANEOUS) ×2
IV LACTATED RINGERS 1000ML (IV SOLUTION) ×2 IMPLANT
KIT RM TURNOVER STRD PROC AR (KITS) ×2 IMPLANT
LABEL OR SOLS (LABEL) ×2 IMPLANT
NDL INSUFF ACCESS 14 VERSASTEP (NEEDLE) ×2 IMPLANT
NS IRRIG 500ML POUR BTL (IV SOLUTION) ×2 IMPLANT
PACK LAP CHOLECYSTECTOMY (MISCELLANEOUS) ×2 IMPLANT
SCISSORS METZENBAUM CVD 33 (INSTRUMENTS) ×2 IMPLANT
SEAL FOR SCOPE WARMER C3101 (MISCELLANEOUS) ×1 IMPLANT
STRIP CLOSURE SKIN 1/2X4 (GAUZE/BANDAGES/DRESSINGS) ×2 IMPLANT
SUT VIC AB 0 CT2 27 (SUTURE) ×2 IMPLANT
SUT VIC AB 4-0 FS2 27 (SUTURE) ×2 IMPLANT
SWABSTK COMLB BENZOIN TINCTURE (MISCELLANEOUS) ×2 IMPLANT
TROCAR XCEL NON-BLD 11X100MML (ENDOMECHANICALS) ×2 IMPLANT
TUBING INSUFFLATOR HI FLOW (MISCELLANEOUS) ×2 IMPLANT
WATER STERILE IRR 1000ML POUR (IV SOLUTION) ×2 IMPLANT

## 2016-04-16 NOTE — Op Note (Signed)
Preoperative diagnosis: Chronic cholecystitis, possible polyp.  Postoperative diagnosis: Same.     operative procedure: Laparoscopic cholecystectomy with intraoperative cholangiogram.  Operating surgeon: Lane HackerJeffery Byrnett, M.D.  Anesthesia: Gen. endotracheal.  Estimated blood loss: 10 mL.  Clinical note: This 40 year old woman has had episodic upper abdominal pain. Ultrasound showed a suggestion of possible polyp in thickened sludge. No clear gallbladder stones. Clinical history is consistent with cholecystitis. She was felt to be candidate for cholecystectomy.  Operative note: With the patient under adequate general endotracheal anesthesia the abdomen was prepped with ChloraPrep and draped. The patient in Trendelenburg position, a Asite was chosen above her neo-umbilicus for abdominal entry. A vertical incision was made. The varies needle was passed without difficulty. After using the hanging drop test to confirm intra-abdominal location and the abdomen was insufflated with CO2 a 10 mmHg pressure. This was later increased to 12 mm. A 10 mm step port was expanded. Inspection showed no evidence of injury from initial port placement. No adhesions the anterior bowel wall except for one thin band from the omentum. An 11 mm XL port was placed in the epigastrium after the patient was placed in the reverse Trendelenburg position and rolled to the left. The adhesion noted above was taken down with cautery. 2-5 mm Depth ports were then placed laterally. The gallbladder was noted to be distended. No evidence of ischemia. Chronic inflammatory changes noted in the neck of the gallbladder. The gallbladder was decompressed. It was placed on cephalad traction. The adhesions around the near intrahepatic gallbladder were taken down. The cystic duct was isolated. A Kumar clamp was placed. Fluoroscopic cholangiograms were completed using 10 mL of one half strength Conray 60. This showed prompt filling of the right and  left hepatic ducts and free flow to the duodenum. The cystic duct was doubly clipped and divided. The cystic artery was clipped and a second branch identified and clipped as well. The gallbladder was removed from the liver bed making use of hook cautery dissection. It was placed into an Endo Catch bag. This was then delivered through the umbilical port site. After reestablishing pneumoperitoneum inspection showed good hemostasis. The right upper quadrant was irrigated with lactated Ringer's solution. The fascia at the umbilicus and epigastric port sites were closed with 0 Vicryl figure-of-eight sutures. Skin incisions were closed with 4-0 Vicryl subcuticular sutures. Benzoin, Steri-Strips, Telfa and Tegaderm dressings were applied.  The patient tolerated the procedure well was taken to the recovery room in stable condition.

## 2016-04-16 NOTE — H&P (Signed)
No change in clinical history or exam. Patient no longer has fitted stocking for DVT prevention. Will use orthopaedic foot pumps to minimize risk of DVT.

## 2016-04-16 NOTE — Transfer of Care (Signed)
Immediate Anesthesia Transfer of Care Note  Patient: Michaela Jones  Procedure(s) Performed: Procedure(s): LAPAROSCOPIC CHOLECYSTECTOMY WITH INTRAOPERATIVE CHOLANGIOGRAM (N/A)  Patient Location: PACU  Anesthesia Type:General  Level of Consciousness: awake  Airway & Oxygen Therapy: Patient Spontanous Breathing and Patient connected to face mask oxygen  Post-op Assessment: Report given to RN and Post -op Vital signs reviewed and stable  Post vital signs: Reviewed and stable  Last Vitals:  Vitals:   04/16/16 1328  BP: 128/86  Pulse: 85  Resp: 18  Temp: 36.4 C    Last Pain:  Vitals:   04/16/16 1328  TempSrc: Tympanic  PainSc: 2          Complications: No apparent anesthesia complications

## 2016-04-16 NOTE — Discharge Instructions (Signed)
AMBULATORY SURGERY  °DISCHARGE INSTRUCTIONS ° ° °1) The drugs that you were given will stay in your system until tomorrow so for the next 24 hours you should not: ° °A) Drive an automobile °B) Make any legal decisions °C) Drink any alcoholic beverage ° ° °2) You may resume regular meals tomorrow.  Today it is better to start with liquids and gradually work up to solid foods. ° °You may eat anything you prefer, but it is better to start with liquids, then soup and crackers, and gradually work up to solid foods. ° ° °3) Please notify your doctor immediately if you have any unusual bleeding, trouble breathing, redness and pain at the surgery site, drainage, fever, or pain not relieved by medication. ° ° ° °4) Additional Instructions: ° ° ° ° ° ° ° °Please contact your physician with any problems or Same Day Surgery at [phone], Monday through Friday 6 am to 4 pm, or [place] at [place] Main number at [phone]. °

## 2016-04-16 NOTE — Anesthesia Preprocedure Evaluation (Signed)
Anesthesia Evaluation  Patient identified by MRN, date of birth, ID band Patient awake    Reviewed: Allergy & Precautions, H&P , NPO status , Patient's Chart, lab work & pertinent test results, reviewed documented beta blocker date and time   History of Anesthesia Complications (+) history of anesthetic complications  Airway Mallampati: II  TM Distance: >3 FB Neck ROM: full    Dental no notable dental hx.    Pulmonary neg pulmonary ROS,    Pulmonary exam normal breath sounds clear to auscultation       Cardiovascular Exercise Tolerance: Good negative cardio ROS Normal cardiovascular exam Rhythm:regular Rate:Normal     Neuro/Psych PSYCHIATRIC DISORDERS (Depression) negative neurological ROS     GI/Hepatic negative GI ROS, Neg liver ROS,   Endo/Other  Hypothyroidism   Renal/GU negative Renal ROS  negative genitourinary   Musculoskeletal   Abdominal   Peds  Hematology  (+) Blood dyscrasia, anemia ,   Anesthesia Other Findings Past Medical History: No date: Anemia No date: Chronic back pain No date: Complication of anesthesia     Comment: HARD TO WAKE UP/OXYGEN LEVEL DROPPED AFTER               BACK SURGERY X1 No date: Constipation, chronic No date: Depression No date: Edema No date: History of seasonal allergies     Comment: pt has inhalers for this if needed No date: Hypotension No date: Hypothyroid No date: Insomnia No date: Lymphedema of both lower extremities No date: Migraine No date: Restless leg syndrome No date: Vitamin D deficiency   Reproductive/Obstetrics negative OB ROS                             Anesthesia Physical Anesthesia Plan  ASA: II  Anesthesia Plan: General   Post-op Pain Management:    Induction:   Airway Management Planned:   Additional Equipment:   Intra-op Plan:   Post-operative Plan:   Informed Consent: I have reviewed the patients  History and Physical, chart, labs and discussed the procedure including the risks, benefits and alternatives for the proposed anesthesia with the patient or authorized representative who has indicated his/her understanding and acceptance.   Dental Advisory Given  Plan Discussed with: Anesthesiologist, CRNA and Surgeon  Anesthesia Plan Comments:         Anesthesia Quick Evaluation

## 2016-04-16 NOTE — Anesthesia Procedure Notes (Signed)
Procedure Name: MAC Date/Time: 04/16/2016 2:45 PM Performed by: Deri FuellingPRIVETTE, Evola Hollis Pre-anesthesia Checklist: Patient identified, Emergency Drugs available, Suction available, Patient being monitored and Timeout performed Patient Re-evaluated:Patient Re-evaluated prior to inductionOxygen Delivery Method: Circle system utilized Preoxygenation: Pre-oxygenation with 100% oxygen Intubation Type: IV induction Ventilation: Mask ventilation without difficulty Grade View: Grade III Tube type: Oral Tube size: 7.0 mm Number of attempts: 2 Secured at: 21 cm Tube secured with: Tape Dental Injury: Teeth and Oropharynx as per pre-operative assessment

## 2016-04-17 ENCOUNTER — Encounter: Payer: Self-pay | Admitting: General Surgery

## 2016-04-17 NOTE — Anesthesia Postprocedure Evaluation (Signed)
Anesthesia Post Note  Patient: Michaela Jones  Procedure(s) Performed: Procedure(s) (LRB): LAPAROSCOPIC CHOLECYSTECTOMY WITH INTRAOPERATIVE CHOLANGIOGRAM (N/A)  Patient location during evaluation: PACU Anesthesia Type: General Level of consciousness: awake and alert and oriented Pain management: pain level controlled Vital Signs Assessment: post-procedure vital signs reviewed and stable Respiratory status: spontaneous breathing Cardiovascular status: blood pressure returned to baseline Anesthetic complications: no    Last Vitals:  Vitals:   04/16/16 1706 04/16/16 1731  BP: 128/75 140/77  Pulse: 73 84  Resp: 16 16  Temp: 36.8 C     Last Pain:  Vitals:   04/16/16 1706  TempSrc: Oral  PainSc: 4                  Deseree Zemaitis

## 2016-04-18 LAB — SURGICAL PATHOLOGY

## 2016-04-22 ENCOUNTER — Telehealth: Payer: Self-pay | Admitting: *Deleted

## 2016-04-22 NOTE — Telephone Encounter (Signed)
Patient called and stated that she is still in pain from surgery and wants to know if she can get a refill on Hydrocodone.

## 2016-04-23 ENCOUNTER — Ambulatory Visit (INDEPENDENT_AMBULATORY_CARE_PROVIDER_SITE_OTHER): Payer: 59 | Admitting: General Surgery

## 2016-04-23 ENCOUNTER — Encounter: Payer: Self-pay | Admitting: General Surgery

## 2016-04-23 VITALS — BP 110/70 | HR 82 | Resp 14 | Ht 66.0 in | Wt 235.0 lb

## 2016-04-23 DIAGNOSIS — K810 Acute cholecystitis: Secondary | ICD-10-CM

## 2016-04-23 DIAGNOSIS — K811 Chronic cholecystitis: Secondary | ICD-10-CM

## 2016-04-23 NOTE — Progress Notes (Addendum)
Patient ID: Michaela Jones, female   DOB: [birth year], 40 y.o.   MRN: [id number]  Chief Complaint  Patient presents with  . Routine Post Op    HPI Michaela Jones is a 40 y.o. female.  Here today for postoperative visit. She states she is doing well. She does have mild nausea but she has been eating a bland diet. She does state she had some discomfort last night near umbilicus area. She did have a reaction to the steri strips. She has been using some cream to the area.   Marland KitchenHPI  Past Medical History:  Diagnosis Date  . Anemia   . Chronic back pain   . Complication of anesthesia    HARD TO WAKE UP/OXYGEN LEVEL DROPPED AFTER BACK SURGERY X1  . Constipation, chronic   . Depression   . Edema   . History of seasonal allergies    pt has inhalers for this if needed  . Hypotension   . Hypothyroid   . Insomnia   . Lymphedema of both lower extremities   . Migraine   . Restless leg syndrome   . Vitamin D deficiency     Past Surgical History:  Procedure Laterality Date  . ABLATION  2014  . BACK SURGERY  10/2012  . BREAST ENHANCEMENT SURGERY  07/2008  . BREAST SURGERY  09/05/2011   removal of implants  . CHOLECYSTECTOMY N/A 04/16/2016   Procedure: LAPAROSCOPIC CHOLECYSTECTOMY WITH INTRAOPERATIVE CHOLANGIOGRAM;  Surgeon: Robert Bellow, MD;  Location: [facility] ORS;  Service: General;  Laterality: N/A;  . GASTRIC BYPASS  2002  . Lower body  01/2008, 03/2008   lift legs and thighs  . tummy tuck  2004    Family History  Problem Relation Age of Onset  . Pulmonary embolism Mother   . Depression Mother   . Asthma Mother   . Heart attack Father   . Cancer Father     Social History Social History  Substance Use Topics  . Smoking status: Never Smoker  . Smokeless tobacco: Never Used  . Alcohol use 0.0 oz/week     Comment: SOCIALLY    Allergies  Allergen Reactions  . Lyrica [Pregabalin] Other (See Comments)    Patient reports neurological problem. Speech disturbances and unable to  control motor skills  . Nsaids Other (See Comments)    Avoids due to gastric bypass  . Penicillins Rash    Current Outpatient Prescriptions  Medication Sig Dispense Refill  . albuterol (PROVENTIL HFA;VENTOLIN HFA) 108 (90 Base) MCG/ACT inhaler Inhale 2 puffs into the lungs every 6 (six) hours as needed for wheezing or shortness of breath.    Marland Kitchen albuterol (PROVENTIL) (5 MG/ML) 0.5% nebulizer solution Inhale into the lungs.    . celecoxib (CELEBREX) 200 MG capsule Take 200 mg by mouth as needed.     . clonazePAM (KLONOPIN) 1 MG tablet Take 1 mg by mouth at bedtime.   0  . Cyanocobalamin 1000 MCG/ML KIT Inject 1 Dose as directed every 30 (thirty) days.     Marland Kitchen levothyroxine (SYNTHROID) 112 MCG tablet Take 112 mcg by mouth daily before breakfast.     . oxymorphone (OPANA) 10 MG tablet Take 5-10 mg by mouth 4 (four) times daily.     . promethazine (PHENERGAN) 25 MG tablet TAKE 1/2 TO 1 TABLET BY MOUTH EVERY 6 HOURS AS NEEED FOR NAUSEA 10 tablet 6  . sertraline (ZOLOFT) 100 MG tablet Take 100 mg by mouth at bedtime.   0  .  VOLTAREN 1 % GEL APPLY 4 GRAMS TO AFFECTED KNEE UP TO 4 TIMES A DAY AS NEEDED 400 g 1  . modafinil (PROVIGIL) 100 MG tablet Take 100 mg by mouth daily.     No current facility-administered medications for this visit.     Review of Systems Review of Systems  Constitutional: Negative.   Respiratory: Negative.   Cardiovascular: Negative.   Gastrointestinal: Positive for nausea.  Neurological: Positive for headaches.    Blood pressure 110/70, pulse 82, resp. rate 14, height 5' 6"  (1.676 m), weight 235 lb (106.6 kg).  Physical Exam Physical Exam  Constitutional: She is oriented to person, place, and time. She appears well-developed and well-nourished.  HENT:  Mouth/Throat: Oropharynx is clear and moist.  Eyes: Conjunctivae are normal. No scleral icterus.  Neck: Neck supple.  Cardiovascular: Normal rate, regular rhythm and normal heart sounds.   Pulmonary/Chest: Effort  normal and breath sounds normal.  Abdominal: Soft. Normal appearance. There is no tenderness.  Port sites well healed with irritation from steri strips.  Lymphadenopathy:    She has no cervical adenopathy.  Neurological: She is alert and oriented to person, place, and time.  Skin: Skin is warm and dry.  Psychiatric: Her behavior is normal.    Data Reviewed Pathology from her 04/16/2016: Cholecystectomy:  GALLBLADDER; CHOLECYSTECTOMY:  - CHRONIC CHOLECYSTITIS.  - NEGATIVE FOR DYSPLASIA AND MALIGNANCY.    Abdominal ultrasound dated 03/21/2016 suggested tumor Factive sludge with a mass not excluded. Slight gallbladder wall thickening.  Assessment    Resolution of intense postprandial symptoms, still with some soreness secondary to reaction to benzoin and Steri-Strips at the surgical sites.    Plan    The patient will increase her activity as tolerated. She'll report if she does not have proper resolution of the topical dermatitis at the site of the previously placed Steri-Strips.    Recommend hydrocortisone or caladryl to the irritated skin as needed. Proper lifting techniques reviewed. Resume activities as tolerated. .  This information has been scribed by Karie Fetch RN, BSN,BC.  Robert Bellow 07/17/2016, 6:48 AM

## 2016-04-23 NOTE — Patient Instructions (Addendum)
The patient is aware to call back for any questions or concerns. Recommend hydrocortisone or caladryl to the irritated skin as needed. Proper lifting techniques reviewed. Resume activities as tolerated. .Marland Kitchen

## 2016-05-15 DIAGNOSIS — M5417 Radiculopathy, lumbosacral region: Secondary | ICD-10-CM | POA: Diagnosis not present

## 2016-05-15 DIAGNOSIS — M961 Postlaminectomy syndrome, not elsewhere classified: Secondary | ICD-10-CM | POA: Diagnosis not present

## 2016-05-15 DIAGNOSIS — Z79891 Long term (current) use of opiate analgesic: Secondary | ICD-10-CM | POA: Diagnosis not present

## 2016-05-31 DIAGNOSIS — F3340 Major depressive disorder, recurrent, in remission, unspecified: Secondary | ICD-10-CM | POA: Diagnosis not present

## 2016-06-11 ENCOUNTER — Encounter: Payer: Self-pay | Admitting: Family Medicine

## 2016-06-11 ENCOUNTER — Ambulatory Visit (INDEPENDENT_AMBULATORY_CARE_PROVIDER_SITE_OTHER): Payer: 59 | Admitting: Family Medicine

## 2016-06-11 VITALS — BP 100/76 | HR 68 | Temp 98.0°F | Resp 16 | Wt 243.0 lb

## 2016-06-11 DIAGNOSIS — B349 Viral infection, unspecified: Secondary | ICD-10-CM

## 2016-06-11 NOTE — Progress Notes (Signed)
Subjective:     Patient ID: Michaela Jones, female   DOB: 1975-11-08, 40 y.o.   MRN: [id number]  HPI  Chief Complaint  Patient presents with  . URI    x 3 days. Pt c/o sore throat, dry cough, fever up to 100.7 (afebrile in office), rhinorrhea, fatigue. Pt's children have had strep throat in the last two weeks. Pt has tried Geologist, engineeringZytec, Flonase, an old rx of Keflex, without relief.  Uses narcotic pain medication prn for chronic back pain.   Review of Systems     Objective:   Physical Exam  Constitutional: She appears well-developed and well-nourished. No distress.  Ears: T.M's intact without inflammation Throat: no tonsillar enlargement or exudate Neck: bilateral anterior and right posterior cervical nodes Lungs: clear     Assessment:    1. Viral syndrome    Plan:    Discussed natural history and use of otc medication.

## 2016-06-11 NOTE — Patient Instructions (Signed)
Discussed use of Mucinex D for congestion and Delsym for cough.Your pain medication may help suppress cough if keeping you up at night.

## 2016-06-27 ENCOUNTER — Ambulatory Visit: Payer: 59 | Admitting: Physician Assistant

## 2016-06-28 DIAGNOSIS — F3340 Major depressive disorder, recurrent, in remission, unspecified: Secondary | ICD-10-CM | POA: Diagnosis not present

## 2016-07-16 DIAGNOSIS — H5213 Myopia, bilateral: Secondary | ICD-10-CM | POA: Diagnosis not present

## 2016-07-18 ENCOUNTER — Other Ambulatory Visit: Payer: Self-pay | Admitting: Family Medicine

## 2016-07-18 NOTE — Telephone Encounter (Signed)
Last ov 06/11/16. Last filled 04/08/16. Please review. Thank you. sd

## 2016-08-08 ENCOUNTER — Ambulatory Visit: Payer: 59 | Admitting: Family Medicine

## 2016-08-09 ENCOUNTER — Ambulatory Visit (INDEPENDENT_AMBULATORY_CARE_PROVIDER_SITE_OTHER): Payer: 59 | Admitting: Family Medicine

## 2016-08-09 ENCOUNTER — Encounter: Payer: Self-pay | Admitting: Family Medicine

## 2016-08-09 VITALS — BP 90/60 | HR 95 | Temp 98.5°F | Resp 16

## 2016-08-09 DIAGNOSIS — J111 Influenza due to unidentified influenza virus with other respiratory manifestations: Secondary | ICD-10-CM | POA: Diagnosis not present

## 2016-08-09 MED ORDER — HYDROCOD POLST-CPM POLST ER 10-8 MG/5ML PO SUER: 5.0000 mL | Freq: Two times a day (BID) | ORAL | 0 refills | Status: DC | PRN

## 2016-08-09 NOTE — Patient Instructions (Signed)
Discussed use of Mucinex D. May also try Delsym when not using Tussionex for cough.

## 2016-08-09 NOTE — Progress Notes (Addendum)
Subjective:     Patient ID: Michaela Jones, female   DOB: Feb 02, 1976, 41 y.o.   MRN: [id number]  HPI  Chief Complaint  Patient presents with  . Sore Throat    Patient comes in office today with compaints of sore throat and body ache for the past 2-3 days. Patient describes pain in throat as severe and states that she has bilateral ear pain. Patient also has conerns of diarrhea, fever high of 102, cough, sinus pressure and headache and nausea. Patient states that she has been taking otc Aleve  Did not have the flu shot this season. Attributes her diarrhea to nerves as her [place] is in [place] for a class. States she will hold her pain mediation so she can use a narcotic cough syrup.   Review of Systems     Objective:   Physical Exam  Constitutional: She appears well-developed and well-nourished. She has a sickly appearance. No distress.  Ears: T.M's intact without inflammation Throat: no tonsillar enlargement or exudate Neck: bilateral tender anterior cervical nodes Lungs: clear    Assessment:    1. Flu - chlorpheniramine-HYDROcodone (TUSSIONEX PENNKINETIC ER) 10-8 MG/5ML SUER; Take 5 mLs by mouth every 12 (twelve) hours as needed for cough.  Dispense: 120 mL; Refill: 0    Plan:    Discussed use of Mucinex D.

## 2016-08-19 DIAGNOSIS — Z79891 Long term (current) use of opiate analgesic: Secondary | ICD-10-CM | POA: Diagnosis not present

## 2016-08-19 DIAGNOSIS — M5417 Radiculopathy, lumbosacral region: Secondary | ICD-10-CM | POA: Diagnosis not present

## 2016-08-19 DIAGNOSIS — Z5181 Encounter for therapeutic drug level monitoring: Secondary | ICD-10-CM | POA: Diagnosis not present

## 2016-08-19 DIAGNOSIS — M961 Postlaminectomy syndrome, not elsewhere classified: Secondary | ICD-10-CM | POA: Diagnosis not present

## 2016-08-30 ENCOUNTER — Ambulatory Visit (INDEPENDENT_AMBULATORY_CARE_PROVIDER_SITE_OTHER): Payer: 59 | Admitting: Physician Assistant

## 2016-08-30 ENCOUNTER — Encounter: Payer: Self-pay | Admitting: Physician Assistant

## 2016-08-30 VITALS — BP 122/84 | HR 80 | Temp 98.8°F | Resp 16 | Wt 260.0 lb

## 2016-08-30 DIAGNOSIS — Z9884 Bariatric surgery status: Secondary | ICD-10-CM

## 2016-08-30 DIAGNOSIS — M545 Low back pain, unspecified: Secondary | ICD-10-CM

## 2016-08-30 DIAGNOSIS — R05 Cough: Secondary | ICD-10-CM | POA: Diagnosis not present

## 2016-08-30 DIAGNOSIS — J0110 Acute frontal sinusitis, unspecified: Secondary | ICD-10-CM | POA: Diagnosis not present

## 2016-08-30 DIAGNOSIS — R059 Cough, unspecified: Secondary | ICD-10-CM

## 2016-08-30 MED ORDER — CYCLOBENZAPRINE HCL 10 MG PO TABS: 10.0000 mg | ORAL_TABLET | Freq: Three times a day (TID) | ORAL | 0 refills | Status: DC | PRN

## 2016-08-30 MED ORDER — HYDROCODONE-HOMATROPINE 5-1.5 MG/5ML PO SYRP: 5.0000 mL | ORAL_SOLUTION | Freq: Three times a day (TID) | ORAL | 0 refills | Status: DC | PRN

## 2016-08-30 MED ORDER — DOXYCYCLINE HYCLATE 100 MG PO TABS: 100.0000 mg | ORAL_TABLET | Freq: Two times a day (BID) | ORAL | 0 refills | Status: DC

## 2016-08-30 NOTE — Patient Instructions (Signed)

## 2016-08-30 NOTE — Progress Notes (Signed)
Patient: Michaela Jones Female    DOB: 11/01/1975   41 y.o.   MRN: [id number] Visit Date: 08/30/2016  Today's Provider: Trinna Post, PA-C   Chief Complaint  Patient presents with  . URI  . Back Pain    Recent flare secondary to a fall.   Subjective:    URI   This is a new problem. The current episode started 1 to 4 weeks ago. Associated symptoms include congestion, coughing, diarrhea, headaches, rhinorrhea, sinus pain, a sore throat and wheezing. Pertinent negatives include no abdominal pain, ear pain (Ears feel "Stopped up"), nausea, neck pain, sneezing or vomiting.  Back Pain  This is a chronic problem. The problem has been gradually worsening (Pt reports she fell a few days ago. ) since onset. The pain is present in the lumbar spine (Right worse than left). The pain does not radiate. Associated symptoms include a fever (The highest was 100-101 Wednesday) and headaches. Pertinent negatives include no abdominal pain, bladder incontinence, bowel incontinence, leg pain, numbness, paresis or paresthesias.   Patient is 41 y/o woman with history of Roux-en-Y bypass in 2004, history of lumbar laminectomy followed by pain clinic at [facility], and  who was seen on 08/09/2016 for flu. She felt like she was improving but then got worse. Reports lots of sinus congestion, PND, cough, sometimes fever. No more body aches or chills.  [place] the other day. Pain worsening in lumbar back. No weakness, problems walking. Would like some flexeril for muscle spasms.  Also wants to know about referral for bariatrics. Had bariatric surgery in 2004 in [place]. She is also s/p lap chole in 2017. Last follow up was in 2005 before she moved. She reports having some problems with her pouch. Hasn't been followed up since then.    Allergies  Allergen Reactions  . Lyrica [Pregabalin] Other (See Comments)    Patient reports neurological problem. Speech disturbances and unable to control motor skills  . Nsaids Other  (See Comments)    Avoids due to gastric bypass  . Penicillins Rash     Current Outpatient Prescriptions:  .  albuterol (PROVENTIL HFA;VENTOLIN HFA) 108 (90 Base) MCG/ACT inhaler, Inhale 2 puffs into the lungs every 6 (six) hours as needed for wheezing or shortness of breath., Disp: , Rfl:  .  albuterol (PROVENTIL) (5 MG/ML) 0.5% nebulizer solution, Inhale into the lungs., Disp: , Rfl:  .  celecoxib (CELEBREX) 200 MG capsule, Take 200 mg by mouth as needed. , Disp: , Rfl:  .  clonazePAM (KLONOPIN) 1 MG tablet, Take 1 mg by mouth at bedtime. , Disp: , Rfl: 0 .  Cyanocobalamin 1000 MCG/ML KIT, Inject 1 Dose as directed every 30 (thirty) days. , Disp: , Rfl:  .  levothyroxine (SYNTHROID) 112 MCG tablet, Take 112 mcg by mouth daily before breakfast. , Disp: , Rfl:  .  modafinil (PROVIGIL) 100 MG tablet, Take 100 mg by mouth daily., Disp: , Rfl:  .  oxymorphone (OPANA) 10 MG tablet, Take 5-10 mg by mouth 4 (four) times daily. , Disp: , Rfl:  .  promethazine (PHENERGAN) 25 MG tablet, TAKE 1/2 TO 1 TABLET BY MOUTH EVERY 6 HOURS AS NEEED FOR NAUSEA, Disp: 10 tablet, Rfl: 6 .  sertraline (ZOLOFT) 100 MG tablet, Take 100 mg by mouth at bedtime. , Disp: , Rfl: 0 .  VOLTAREN 1 % GEL, APPLY 4 GRAMS TO AFFECTED KNEE UP TO 4 TIMES A DAY AS NEEDED, Disp: 400 g,  Rfl: 1  Review of Systems  Constitutional: Positive for chills, fatigue and fever (The highest was 100-101 Wednesday). Negative for activity change, appetite change, diaphoresis and unexpected weight change.  HENT: Positive for congestion, nosebleeds, postnasal drip, rhinorrhea, sinus pain, sinus pressure, sore throat and voice change. Negative for ear discharge, ear pain (Ears feel "Stopped up"), sneezing, tinnitus and trouble swallowing.   Eyes: Negative.   Respiratory: Positive for cough, chest tightness, shortness of breath and wheezing. Negative for apnea, choking and stridor.   Gastrointestinal: Positive for diarrhea. Negative for abdominal  distention, abdominal pain, anal bleeding, blood in stool, bowel incontinence, constipation, nausea, rectal pain and vomiting.  Genitourinary: Negative for bladder incontinence.  Musculoskeletal: Positive for back pain. Negative for arthralgias, gait problem, joint swelling, myalgias, neck pain and neck stiffness.  Neurological: Positive for headaches. Negative for dizziness, light-headedness, numbness and paresthesias.    Social History  Substance Use Topics  . Smoking status: Never Smoker  . Smokeless tobacco: Never Used  . Alcohol use 0.0 oz/week     Comment: SOCIALLY   Objective:   BP 122/84 (BP Location: Left Wrist, Patient Position: Sitting, Cuff Size: Normal)   Pulse 80   Temp 98.8 F (37.1 C) (Oral)   Resp 16   Wt 260 lb (117.9 kg)   BMI 41.97 kg/m   Physical Exam  Constitutional: She is oriented to person, place, and time. She appears well-developed and well-nourished. No distress.  HENT:  Right Ear: External ear normal.  Left Ear: External ear normal.  Nose: Right sinus exhibits maxillary sinus tenderness and frontal sinus tenderness. Left sinus exhibits maxillary sinus tenderness and frontal sinus tenderness.  Mouth/Throat: Oropharynx is clear and moist. No oropharyngeal exudate, posterior oropharyngeal edema or posterior oropharyngeal erythema.  Tms opaque bilaterally   Eyes: Conjunctivae are normal. Right eye exhibits no discharge. Left eye exhibits no discharge.  Neck: Neck supple.  Cardiovascular: Normal rate and regular rhythm.   Pulmonary/Chest: Effort normal and breath sounds normal.  Abdominal: Soft. Bowel sounds are normal. She exhibits no distension and no mass. There is no tenderness. There is no rebound and no guarding.  Musculoskeletal: She exhibits no edema, tenderness or deformity.  Lumbar paraspinal muscle spasms.  Lymphadenopathy:    She has cervical adenopathy.  Neurological: She is alert and oriented to person, place, and time.  Skin: Skin is  warm and dry. She is not diaphoretic.  Psychiatric: She has a normal mood and affect. Her behavior is normal.        Assessment & Plan:     1. Acute non-recurrent frontal sinusitis  - doxycycline (VIBRA-TABS) 100 MG tablet; Take 1 tablet (100 mg total) by mouth 2 (two) times daily.  Dispense: 20 tablet; Refill: 0  2. Bilateral low back pain without sciatica, unspecified chronicity  - cyclobenzaprine (FLEXERIL) 10 MG tablet; Take 1 tablet (10 mg total) by mouth 3 (three) times daily as needed for muscle spasms.  Dispense: 30 tablet; Refill: 0  3. History of Roux-en-Y gastric bypass  - Amb Referral to Bariatric Surgery  4. Cough  - HYDROcodone-homatropine (HYCODAN) 5-1.5 MG/5ML syrup; Take 5 mLs by mouth every 8 (eight) hours as needed for cough.  Dispense: 120 mL; Refill: 0  I have spent 25 minutes with this patient, >50% of which was spent on counseling and coordination of care.  Return if symptoms worsen or fail to improve.   Patient Instructions  Sinusitis, Adult Sinusitis is soreness and inflammation of your sinuses. Sinuses are hollow  spaces in the bones around your face. They are located:  Around your eyes.  In the middle of your forehead.  Behind your nose.  In your cheekbones. Your sinuses and nasal passages are lined with a stringy fluid (mucus). Mucus normally drains out of your sinuses. When your nasal tissues get inflamed or swollen, the mucus can get trapped or blocked so air cannot flow through your sinuses. This lets bacteria, viruses, and funguses grow, and that leads to infection. Follow these instructions at home: Medicines  Take, use, or apply over-the-counter and prescription medicines only as told by your doctor. These may include nasal sprays.  If you were prescribed an antibiotic medicine, take it as told by your doctor. Do not stop taking the antibiotic even if you start to feel better. Hydrate and Humidify  Drink enough water to keep your pee  (urine) clear or pale yellow.  Use a cool mist humidifier to keep the humidity level in your home above 50%.  Breathe in steam for 10-15 minutes, 3-4 times a day or as told by your doctor. You can do this in the bathroom while a hot shower is running.  Try not to spend time in cool or dry air. Rest  Rest as much as possible.  Sleep with your head raised (elevated).  Make sure to get enough sleep each night. General instructions  Put a warm, moist washcloth on your face 3-4 times a day or as told by your doctor. This will help with discomfort.  Wash your hands often with soap and water. If there is no soap and water, use hand sanitizer.  Do not smoke. Avoid being around people who are smoking (secondhand smoke).  Keep all follow-up visits as told by your doctor. This is important. Contact a doctor if:  You have a fever.  Your symptoms get worse.  Your symptoms do not get better within 10 days. Get help right away if:  You have a very bad headache.  You cannot stop throwing up (vomiting).  You have pain or swelling around your face or eyes.  You have trouble seeing.  You feel confused.  Your neck is stiff.  You have trouble breathing. This information is not intended to replace advice given to you by your health care provider. Make sure you discuss any questions you have with your health care provider. Document Released: 01/01/2008 Document Revised: 03/10/2016 Document Reviewed: 05/10/2015 Elsevier Interactive Patient Education  2017 [employer].   The entirety of the information documented in the History of Present Illness, Review of Systems and Physical Exam were personally obtained by me. Portions of this information were initially documented by Ashley Royalty, CMA and reviewed by me for thoroughness and accuracy.        Trinna Post, PA-C  [facility]

## 2016-10-04 DIAGNOSIS — F3340 Major depressive disorder, recurrent, in remission, unspecified: Secondary | ICD-10-CM | POA: Diagnosis not present

## 2016-10-24 ENCOUNTER — Other Ambulatory Visit: Payer: Self-pay | Admitting: Family Medicine

## 2016-11-01 ENCOUNTER — Other Ambulatory Visit: Payer: Self-pay | Admitting: Family Medicine

## 2016-11-11 DIAGNOSIS — M5417 Radiculopathy, lumbosacral region: Secondary | ICD-10-CM | POA: Diagnosis not present

## 2016-11-11 DIAGNOSIS — Z79891 Long term (current) use of opiate analgesic: Secondary | ICD-10-CM | POA: Diagnosis not present

## 2016-11-11 DIAGNOSIS — Z5181 Encounter for therapeutic drug level monitoring: Secondary | ICD-10-CM | POA: Diagnosis not present

## 2016-11-11 DIAGNOSIS — M961 Postlaminectomy syndrome, not elsewhere classified: Secondary | ICD-10-CM | POA: Diagnosis not present

## 2017-01-02 DIAGNOSIS — F3340 Major depressive disorder, recurrent, in remission, unspecified: Secondary | ICD-10-CM | POA: Diagnosis not present

## 2017-01-06 ENCOUNTER — Other Ambulatory Visit: Payer: Self-pay | Admitting: Family Medicine

## 2017-01-07 ENCOUNTER — Telehealth: Payer: Self-pay | Admitting: Family Medicine

## 2017-01-07 DIAGNOSIS — K5903 Drug induced constipation: Secondary | ICD-10-CM

## 2017-01-07 DIAGNOSIS — T402X5A Adverse effect of other opioids, initial encounter: Principal | ICD-10-CM

## 2017-01-07 MED ORDER — NALOXEGOL OXALATE 25 MG PO TABS: 25.0000 mg | ORAL_TABLET | Freq: Every day | ORAL | 3 refills | Status: DC

## 2017-01-07 NOTE — Telephone Encounter (Signed)
Please advise 

## 2017-01-07 NOTE — Telephone Encounter (Signed)
Pt husband called stating the Rx AMITIZA 24 MCG capsule is requairing a step down therapy.  Husband is asking if pt can get a new Rx for Movantik 25mg .  Insurance should cover this.  [facility].  WU#[phone]/NFCB#[phone]/MW

## 2017-01-13 DIAGNOSIS — F3340 Major depressive disorder, recurrent, in remission, unspecified: Secondary | ICD-10-CM | POA: Diagnosis not present

## 2017-01-21 DIAGNOSIS — F3340 Major depressive disorder, recurrent, in remission, unspecified: Secondary | ICD-10-CM | POA: Diagnosis not present

## 2017-02-06 DIAGNOSIS — Z79891 Long term (current) use of opiate analgesic: Secondary | ICD-10-CM | POA: Diagnosis not present

## 2017-02-06 DIAGNOSIS — Z5181 Encounter for therapeutic drug level monitoring: Secondary | ICD-10-CM | POA: Diagnosis not present

## 2017-02-06 DIAGNOSIS — M5417 Radiculopathy, lumbosacral region: Secondary | ICD-10-CM | POA: Diagnosis not present

## 2017-02-06 DIAGNOSIS — M961 Postlaminectomy syndrome, not elsewhere classified: Secondary | ICD-10-CM | POA: Diagnosis not present

## 2017-02-12 ENCOUNTER — Other Ambulatory Visit: Payer: Self-pay | Admitting: Family Medicine

## 2017-02-14 ENCOUNTER — Other Ambulatory Visit: Payer: Self-pay | Admitting: Family Medicine

## 2017-02-14 MED ORDER — RIZATRIPTAN BENZOATE 5 MG PO TABS: 5.0000 mg | ORAL_TABLET | ORAL | 5 refills | Status: DC | PRN

## 2017-02-14 NOTE — Progress Notes (Signed)
[facility]ax [facility] from [facility] to change from zolmitriptan to rizatriptan due to step therapy requirments.

## 2017-02-18 DIAGNOSIS — F3340 Major depressive disorder, recurrent, in remission, unspecified: Secondary | ICD-10-CM | POA: Diagnosis not present

## 2017-02-26 ENCOUNTER — Ambulatory Visit: Payer: 59 | Admitting: Physician Assistant

## 2017-02-27 ENCOUNTER — Encounter: Payer: Self-pay | Admitting: Physician Assistant

## 2017-02-27 ENCOUNTER — Ambulatory Visit (INDEPENDENT_AMBULATORY_CARE_PROVIDER_SITE_OTHER): Payer: 59 | Admitting: Physician Assistant

## 2017-02-27 VITALS — BP 128/86 | HR 94 | Temp 98.7°F | Resp 16 | Wt 243.0 lb

## 2017-02-27 DIAGNOSIS — J069 Acute upper respiratory infection, unspecified: Secondary | ICD-10-CM | POA: Diagnosis not present

## 2017-02-27 DIAGNOSIS — W57XXXA Bitten or stung by nonvenomous insect and other nonvenomous arthropods, initial encounter: Secondary | ICD-10-CM | POA: Diagnosis not present

## 2017-02-27 DIAGNOSIS — S40862A Insect bite (nonvenomous) of left upper arm, initial encounter: Secondary | ICD-10-CM | POA: Diagnosis not present

## 2017-02-27 MED ORDER — DOXYCYCLINE HYCLATE 100 MG PO TABS: 100.0000 mg | ORAL_TABLET | Freq: Two times a day (BID) | ORAL | 0 refills | Status: AC

## 2017-02-27 MED ORDER — DOXYCYCLINE HYCLATE 100 MG PO TABS: 100.0000 mg | ORAL_TABLET | Freq: Two times a day (BID) | ORAL | 0 refills | Status: DC

## 2017-02-27 NOTE — Patient Instructions (Signed)
Lyme Disease Lyme disease is an infection that affects many parts of the body, including the skin, joints, and nervous system. It is a bacterial infection that starts from the bite of an infected tick. The infection can spread, and some of the symptoms are similar to the flu. If Lyme disease is not treated, it may cause joint pain, swelling, numbness, problems thinking, fatigue, muscle weakness, and other problems. What are the causes? This condition is caused by bacteria called Borrelia burgdorferi. You can get Lyme disease by being bitten by an infected tick. The tick must be attached to your skin to pass along the infection. Deer often carry infected ticks. What increases the risk? The following factors may make you more likely to develop this condition:  Living in or visiting these areas in the [place]  [place].  The [place].  The [place].  Spending time in wooded or grassy areas.  Being outdoors with exposed skin.  Camping, gardening, hiking, fishing, or hunting outdoors.  Failing to remove a tick from your skin within 3-4 days. What are the signs or symptoms? Symptoms of this condition include:  A round, red rash that surrounds the center of the tick bite. This is the first sign of infection. The center of the rash may be blood colored or have tiny blisters.  Fatigue.  Headache.  Chills and fever.  General achiness.  Joint pain, often in the knees.  Muscle pain.  Swollen lymph glands.  Stiff neck. How is this diagnosed? This condition is diagnosed based on:  Your symptoms and medical history.  A physical exam.  A blood test. How is this treated? The main treatment for this condition is antibiotic medicine, which is usually taken by mouth (orally). The length of treatment depends on how soon after a tick bite you begin taking the medicine. In some cases, treatment is necessary for several weeks. If the infection is severe, antibiotics may  need to be given through an IV tube that is inserted into one of your veins. Follow these instructions at home:  Take your antibiotic medicine as told by your health care provider. Do not stop taking the antibiotic even if you start to feel better.  Ask your health care provider about takinga probiotic in between doses of your antibiotic to help avoid stomach upset or diarrhea.  Check with your health care provider before supplementing your treatment. Many alternative therapies have not been proven and may be harmful to you.  Keep all follow-up visits as told by your health care provider. This is important. How is this prevented? You can become reinfected if you get another tick bite from an infected tick. Take these steps to help prevent an infection:  Cover your skin with light-colored clothing when you are outdoors in the spring and summer months.  Spray clothing and skin with bug spray. The spray should be 20-30% DEET.  Avoid wooded, grassy, and shaded areas.  Remove yard litter, brush, trash, and plants that attract deer and rodents.  Check yourself for ticks when you come indoors.  Wash clothing worn each day.  Check your pets for ticks before they come inside.  If you find a tick:  Remove it with tweezers.  Clean your hands and the bite area with rubbing alcohol or soap and water. Pregnant women should take special care to avoid tick bites because the infection can be passed along to the fetus. Contact a health care provider if:  You have symptoms after   treatment.  You have removed a tick and want to bring it to your health care provider for testing. Get help right away if:  You have an irregular heartbeat.  You have nerve pain.  Your face feels numb. This information is not intended to replace advice given to you by your health care provider. Make sure you discuss any questions you have with your health care provider. Document Released: 10/21/2000 Document  Revised: 03/05/2016 Document Reviewed: 03/05/2016 Elsevier Interactive Patient Education  2017 Elsevier Inc.  

## 2017-02-27 NOTE — Progress Notes (Signed)
Patient: Michaela Jones Female    DOB: [birth year]   41 y.o.   MRN: [id number] Visit Date: 02/27/2017  Today's Provider: Trinna Post, PA-C   Chief Complaint  Patient presents with  . Insect Bite    Tick bite about two weeks ago.   Subjective:    Pt reports pulling a tick off the back of her left shoulder. Unsure how long it was attached, unsure what kind of tick it was. She reports having a migraine two days after removing the tick, there is still a small rash from the bite. No fevers, chills, myalgias.   URI   This is a new problem. The current episode started 1 to 4 weeks ago. The problem has been gradually worsening. Associated symptoms include congestion, coughing, headaches, sinus pain and a sore throat. Pertinent negatives include no ear pain, rhinorrhea or wheezing.       Allergies  Allergen Reactions  . Lyrica [Pregabalin] Other (See Comments)    Patient reports neurological problem. Speech disturbances and unable to control motor skills  . Nsaids Other (See Comments)    Avoids due to gastric bypass  . Penicillins Rash     Current Outpatient Prescriptions:  .  albuterol (PROVENTIL HFA;VENTOLIN HFA) 108 (90 Base) MCG/ACT inhaler, Inhale 2 puffs into the lungs every 6 (six) hours as needed for wheezing or shortness of breath., Disp: , Rfl:  .  albuterol (PROVENTIL) (5 MG/ML) 0.5% nebulizer solution, Inhale into the lungs., Disp: , Rfl:  .  celecoxib (CELEBREX) 200 MG capsule, Take 200 mg by mouth as needed. , Disp: , Rfl:  .  clonazePAM (KLONOPIN) 1 MG tablet, Take 1 mg by mouth at bedtime. , Disp: , Rfl: 0 .  cyanocobalamin (,VITAMIN B-12,) 1000 MCG/ML injection, INJECT 1 ML ONCE MONTHLY, Disp: 6 mL, Rfl: 2 .  Cyanocobalamin 1000 MCG/ML KIT, Inject 1 Dose as directed every 30 (thirty) days. , Disp: , Rfl:  .  cyclobenzaprine (FLEXERIL) 10 MG tablet, Take 1 tablet (10 mg total) by mouth 3 (three) times daily as needed for muscle spasms., Disp: 30 tablet, Rfl:  0 .  doxycycline (VIBRA-TABS) 100 MG tablet, Take 1 tablet (100 mg total) by mouth 2 (two) times daily., Disp: 20 tablet, Rfl: 0 .  HYDROcodone-homatropine (HYCODAN) 5-1.5 MG/5ML syrup, Take 5 mLs by mouth every 8 (eight) hours as needed for cough., Disp: 120 mL, Rfl: 0 .  levothyroxine (SYNTHROID) 112 MCG tablet, Take 112 mcg by mouth daily before breakfast. , Disp: , Rfl:  .  modafinil (PROVIGIL) 100 MG tablet, Take 100 mg by mouth daily., Disp: , Rfl:  .  naloxegol oxalate (MOVANTIK) 25 MG TABS tablet, Take 1 tablet (25 mg total) by mouth daily., Disp: 30 tablet, Rfl: 3 .  oxymorphone (OPANA) 10 MG tablet, Take 5-10 mg by mouth 4 (four) times daily. , Disp: , Rfl:  .  promethazine (PHENERGAN) 25 MG tablet, TAKE 1/2 TO 1 TABLET BY MOUTH EVERY 6 HOURS AS NEEED FOR NAUSEA, Disp: 10 tablet, Rfl: 6 .  rizatriptan (MAXALT) 5 MG tablet, Take 1 tablet (5 mg total) by mouth as needed for migraine. May repeat in 2 hours if needed, Disp: 6 tablet, Rfl: 5 .  sertraline (ZOLOFT) 100 MG tablet, Take 100 mg by mouth at bedtime. , Disp: , Rfl: 0 .  VOLTAREN 1 % GEL, APPLY 4 GRAMS TO AFFECTED KNEE UP TO 4 TIMES A DAY AS NEEDED, Disp: 400 g, Rfl: 1  Review of Systems  Constitutional: Positive for fatigue. Negative for activity change, appetite change, chills, diaphoresis, fever and unexpected weight change.  HENT: Positive for congestion, nosebleeds, sinus pain, sinus pressure, sore throat and voice change. Negative for ear discharge, ear pain, postnasal drip, rhinorrhea, tinnitus and trouble swallowing.   Eyes: Positive for photophobia. Negative for pain, discharge, redness, itching and visual disturbance.  Respiratory: Positive for cough, chest tightness and shortness of breath. Negative for apnea, choking, wheezing and stridor.   Gastrointestinal: Negative.   Skin: Negative.   Neurological: Positive for headaches. Negative for dizziness and light-headedness.    Social History  Substance Use Topics  .  Smoking status: Never Smoker  . Smokeless tobacco: Never Used  . Alcohol use 0.0 oz/week     Comment: SOCIALLY   Objective:   BP 128/86 (BP Location: Left Arm, Patient Position: Sitting, Cuff Size: Large)   Pulse 94   Temp 98.7 F (37.1 C) (Oral)   Resp 16   Wt 243 lb (110.2 kg)   SpO2 95%   BMI 39.22 kg/m  Vitals:   02/27/17 1349  BP: 128/86  Pulse: 94  Resp: 16  Temp: 98.7 F (37.1 C)  TempSrc: Oral  SpO2: 95%  Weight: 243 lb (110.2 kg)     Physical Exam  Constitutional: She appears well-developed and well-nourished.  HENT:  Right Ear: External ear normal.  Left Ear: External ear normal.  Mouth/Throat: Oropharynx is clear and moist. No oropharyngeal exudate.  Eyes: Right eye exhibits discharge. Left eye exhibits discharge.  Neck: Neck supple.  Cardiovascular: Normal rate and regular rhythm.   Pulmonary/Chest: Effort normal and breath sounds normal. No respiratory distress. She has no rales.  Lymphadenopathy:    She has no cervical adenopathy.  Skin: Skin is warm and dry.  Small erythematous induration in left axillary region. No pus or drainage, no rash.  Psychiatric: She has a normal mood and affect. Her behavior is normal.        Assessment & Plan:     1. Upper respiratory tract infection, unspecified type  Think this is viral and unrelated.  2. Tick bite of left axillary region, initial encounter  Will cover for tickborne illness.  - doxycycline (VIBRA-TABS) 100 MG tablet; Take 1 tablet (100 mg total) by mouth 2 (two) times daily.  Dispense: 20 tablet; Refill: 0  Return if symptoms worsen or fail to improve.  The entirety of the information documented in the History of Present Illness, Review of Systems and Physical Exam were personally obtained by me. Portions of this information were initially documented by Ashley Royalty, CMA and reviewed by me for thoroughness and accuracy.        Trinna Post, PA-C  [facility]

## 2017-03-06 DIAGNOSIS — Z5181 Encounter for therapeutic drug level monitoring: Secondary | ICD-10-CM | POA: Diagnosis not present

## 2017-03-06 DIAGNOSIS — Z79891 Long term (current) use of opiate analgesic: Secondary | ICD-10-CM | POA: Diagnosis not present

## 2017-03-06 DIAGNOSIS — M961 Postlaminectomy syndrome, not elsewhere classified: Secondary | ICD-10-CM | POA: Diagnosis not present

## 2017-03-06 DIAGNOSIS — M5417 Radiculopathy, lumbosacral region: Secondary | ICD-10-CM | POA: Diagnosis not present

## 2017-03-18 DIAGNOSIS — F3340 Major depressive disorder, recurrent, in remission, unspecified: Secondary | ICD-10-CM | POA: Diagnosis not present

## 2017-03-24 ENCOUNTER — Other Ambulatory Visit: Payer: Self-pay | Admitting: Family Medicine

## 2017-04-03 DIAGNOSIS — M5417 Radiculopathy, lumbosacral region: Secondary | ICD-10-CM | POA: Diagnosis not present

## 2017-04-03 DIAGNOSIS — M961 Postlaminectomy syndrome, not elsewhere classified: Secondary | ICD-10-CM | POA: Diagnosis not present

## 2017-04-03 DIAGNOSIS — Z79891 Long term (current) use of opiate analgesic: Secondary | ICD-10-CM | POA: Diagnosis not present

## 2017-04-03 DIAGNOSIS — Z5181 Encounter for therapeutic drug level monitoring: Secondary | ICD-10-CM | POA: Diagnosis not present

## 2017-04-24 ENCOUNTER — Encounter: Payer: Self-pay | Admitting: *Deleted

## 2017-05-05 DIAGNOSIS — Z79891 Long term (current) use of opiate analgesic: Secondary | ICD-10-CM | POA: Diagnosis not present

## 2017-05-05 DIAGNOSIS — M961 Postlaminectomy syndrome, not elsewhere classified: Secondary | ICD-10-CM | POA: Diagnosis not present

## 2017-05-05 DIAGNOSIS — M5417 Radiculopathy, lumbosacral region: Secondary | ICD-10-CM | POA: Diagnosis not present

## 2017-05-05 DIAGNOSIS — Z5181 Encounter for therapeutic drug level monitoring: Secondary | ICD-10-CM | POA: Diagnosis not present

## 2017-05-06 ENCOUNTER — Other Ambulatory Visit
Admission: RE | Admit: 2017-05-06 | Discharge: 2017-05-06 | Disposition: A | Discharge status: Home / Self Care | Payer: 59 | Source: Ambulatory Visit | Attending: Pain Medicine | Admitting: Pain Medicine

## 2017-05-06 DIAGNOSIS — Z5181 Encounter for therapeutic drug level monitoring: Secondary | ICD-10-CM | POA: Diagnosis not present

## 2017-05-06 DIAGNOSIS — Z79891 Long term (current) use of opiate analgesic: Secondary | ICD-10-CM | POA: Diagnosis not present

## 2017-05-09 LAB — DRUG PROFILE, UR, 9 DRUGS (LABCORP)
Amphetamines, Urine: NEGATIVE ng/mL
Barbiturate, Ur: NEGATIVE ng/mL
Benzodiazepine Quant, Ur: NEGATIVE
Cannabinoid Quant, Ur: NEGATIVE ng/mL
Cocaine (Metab.): NEGATIVE ng/mL
Methadone Screen, Urine: NEGATIVE ng/mL
Opiate Quant, Ur: NEGATIVE ng/mL
Phencyclidine, Ur: NEGATIVE ng/mL
Propoxyphene, Urine: NEGATIVE ng/mL

## 2017-06-18 ENCOUNTER — Ambulatory Visit: Payer: 59 | Admitting: Physician Assistant

## 2017-06-23 ENCOUNTER — Ambulatory Visit (INDEPENDENT_AMBULATORY_CARE_PROVIDER_SITE_OTHER): Payer: 59 | Admitting: Physician Assistant

## 2017-06-23 ENCOUNTER — Encounter: Payer: Self-pay | Admitting: Physician Assistant

## 2017-06-23 VITALS — BP 114/68 | HR 68 | Temp 98.8°F | Resp 16 | Wt 244.0 lb

## 2017-06-23 DIAGNOSIS — J069 Acute upper respiratory infection, unspecified: Secondary | ICD-10-CM | POA: Diagnosis not present

## 2017-06-23 DIAGNOSIS — J309 Allergic rhinitis, unspecified: Secondary | ICD-10-CM

## 2017-06-23 MED ORDER — PROMETHAZINE-DM 6.25-15 MG/5ML PO SYRP: 5.0000 mL | ORAL_SOLUTION | Freq: Every evening | ORAL | 0 refills | Status: DC | PRN

## 2017-06-23 MED ORDER — FLUTICASONE PROPIONATE 50 MCG/ACT NA SUSP: 2.0000 | Freq: Every day | NASAL | 6 refills | Status: DC

## 2017-06-23 NOTE — Progress Notes (Signed)
[place]  Chief Complaint  Patient presents with  . Sinusitis  . URI    Subjective:    Patient ID: Michaela Jones, female    DOB: [birth year], 41 y.o.   MRN: [id number]  Upper Respiratory Infection: Michaela Jones is a 41 y.o. female complaining of symptoms of a URI, possible sinusitis. Symptoms include congestion, fever, plugged sensation in both ears and sore throat. Onset of symptoms was 4 days ago, gradually improving since that time. She also c/o bilateral ear pressure/pain, cough described as productive, low grade fever and nasal congestion for the past 4 days . She had some Tamiflu at home and completed a course of this. She is drinking plenty of fluids. Evaluation to date: none. Treatment to date: Tamiflu. The treatment has provided minimal relief.   Review of Systems  Constitutional: Positive for fatigue and fever (Temp of 102 on Friday Night.). Negative for activity change, appetite change, chills, diaphoresis and unexpected weight change.  HENT: Positive for congestion, ear pain, postnasal drip, sinus pressure, sinus pain and sore throat. Negative for ear discharge and rhinorrhea.   Respiratory: Positive for cough, chest tightness (This has improved some) and shortness of breath (Has improved). Negative for apnea, choking, wheezing and stridor.   Cardiovascular: Negative for chest pain, palpitations and leg swelling.  Gastrointestinal: Positive for diarrhea (Started around Monday.). Negative for abdominal distention, abdominal pain, anal bleeding, blood in stool, constipation, nausea, rectal pain and vomiting.  Allergic/Immunologic: Positive for environmental allergies.  Neurological: Positive for headaches. Negative for dizziness and light-headedness.       Objective:   BP 114/68 (BP Location: Right Arm, Patient Position: Sitting, Cuff Size: Large)   Pulse 68   Temp 98.8 F (37.1 C) (Oral)   Resp 16   Wt 244 lb (110.7 kg)   SpO2 97%    BMI 39.38 kg/m   Patient Active Problem List   Diagnosis Date Noted  . Vitamin D deficiency 03/01/2015  . Acquired spondylolisthesis 03/01/2015  . Elevated LDL cholesterol level 03/01/2015  . Chronic constipation 03/01/2015  . Back pain, chronic 03/01/2015  . Dysthymia 11/11/2013  . Absolute anemia 10/15/2012  . Bariatric surgery status 10/15/2012  . Insomnia 09/03/2007  . Allergic rhinitis 06/15/2007  . Restless leg 09/23/2006  . Headache, migraine 08/27/2006  . Acquired lymphedema 07/29/1998  . Adult hypothyroidism 07/29/1992    Outpatient Encounter Medications as of 06/23/2017  Medication Sig Note  . albuterol (PROVENTIL HFA;VENTOLIN HFA) 108 (90 Base) MCG/ACT inhaler Inhale 2 puffs into the lungs every 6 (six) hours as needed for wheezing or shortness of breath.   Marland Kitchen albuterol (PROVENTIL) (5 MG/ML) 0.5% nebulizer solution Inhale into the lungs. 03/01/2015: Medication taken as needed.  Received from: [facility]  . ALPRAZolam (XANAX) 1 MG tablet    . celecoxib (CELEBREX) 200 MG capsule Take 200 mg by mouth as needed.  05/26/2015: Received from: [facility]  . cyanocobalamin (,VITAMIN B-12,) 1000 MCG/ML injection INJECT 1 ML ONCE MONTHLY   . Cyanocobalamin 1000 MCG/ML KIT Inject 1 Dose as directed every 30 (thirty) days.    . cyclobenzaprine (FLEXERIL) 10 MG tablet Take 1 tablet (10 mg total) by mouth 3 (three) times daily as needed for muscle spasms.   Marland Kitchen levothyroxine (SYNTHROID) 112 MCG tablet Take 112 mcg by mouth daily before breakfast.  03/20/2016: Received from: [facility]: Take 112 mcg by mouth once daily.   . modafinil (PROVIGIL) 100  MG tablet Take 100 mg by mouth daily.   . naloxegol oxalate (MOVANTIK) 25 MG TABS tablet Take 1 tablet (25 mg total) by mouth daily.   Marland Kitchen oxymorphone (OPANA) 10 MG tablet Take 5-10 mg by mouth 3 (three) times daily.  03/20/2016: Received from: [facility] [facility]: Take 1 tablet (10 mg total) by mouth every 6 (six) hours as needed for Pain. Earliest Fill Date: 04/24/16  . promethazine (PHENERGAN) 25 MG tablet TAKE 1/2 TO 1 TABLET BY MOUTH EVERY 6 HOURS AS NEEED FOR NAUSEA   . rizatriptan (MAXALT) 5 MG tablet Take 1 tablet (5 mg total) by mouth as needed for migraine. May repeat in 2 hours if needed   . sertraline (ZOLOFT) 100 MG tablet Take 100 mg by mouth at bedtime.  06/20/2015: Received from: External Pharmacy  . VOLTAREN 1 % GEL APPLY 4 GRAMS TO AFFECTED KNEE UP TO 4 TIMES A DAY AS NEEDED   . clonazePAM (KLONOPIN) 1 MG tablet Take 1 mg by mouth at bedtime.  06/20/2015: Received from: External Pharmacy  . HYDROcodone-homatropine (HYCODAN) 5-1.5 MG/5ML syrup Take 5 mLs by mouth every 8 (eight) hours as needed for cough.    No facility-administered encounter medications on file as of 06/23/2017.     Allergies  Allergen Reactions  . Lyrica [Pregabalin] Other (See Comments)    Patient reports neurological problem. Speech disturbances and unable to control motor skills  . Nsaids Other (See Comments)    Avoids due to gastric bypass  . Penicillins Rash       Physical Exam  Constitutional: She is oriented to person, place, and time. She appears well-developed and well-nourished.  HENT:  Right Ear: Tympanic membrane and external ear normal.  Left Ear: Tympanic membrane and external ear normal.  Mouth/Throat: Oropharynx is clear and moist. No oropharyngeal exudate.  Eyes: Conjunctivae are normal.  Cardiovascular: Normal rate and regular rhythm.  Pulmonary/Chest: Effort normal and breath sounds normal. No respiratory distress. She has no wheezes. She has no rales.  Lymphadenopathy:    She has cervical adenopathy.  Neurological: She is alert and oriented to person, place, and time.  Skin: Skin is warm and dry.  Psychiatric: She has a normal mood and affect. Her behavior is normal.       Assessment & Plan:  1. URI with cough and  congestion  Viral syndrome, counseled on return precautions. May call back x 1 week if progress to sinus infection.  - promethazine-dextromethorphan (PROMETHAZINE-DM) 6.25-15 MG/5ML syrup; Take 5 mLs by mouth at bedtime as needed for cough.  Dispense: 118 mL; Refill: 0  2. Allergic rhinitis, unspecified seasonality, unspecified trigger  - fluticasone (FLONASE) 50 MCG/ACT nasal spray; Place 2 sprays into both nostrils daily.  Dispense: 16 g; Refill: 6  Return if symptoms worsen or fail to improve.   The entirety of the information documented in the History of Present Illness, Review of Systems and Physical Exam were personally obtained by me. Portions of this information were initially documented by Ashley Royalty, CMA and reviewed by me for thoroughness and accuracy.

## 2017-06-23 NOTE — Patient Instructions (Signed)
Upper Respiratory Infection, Adult Most upper respiratory infections (URIs) are caused by a virus. A URI affects the nose, throat, and upper air passages. The most common type of URI is often called "the common cold." Follow these instructions at home:  Take medicines only as told by your doctor.  Gargle warm saltwater or take cough drops to comfort your throat as told by your doctor.  Use a warm mist humidifier or inhale steam from a shower to increase air moisture. This may make it easier to breathe.  Drink enough fluid to keep your pee (urine) clear or pale yellow.  Eat soups and other clear broths.  Have a healthy diet.  Rest as needed.  Go back to work when your fever is gone or your doctor says it is okay. ? You may need to stay home longer to avoid giving your URI to others. ? You can also wear a face mask and wash your hands often to prevent spread of the virus.  Use your inhaler more if you have asthma.  Do not use any tobacco products, including cigarettes, chewing tobacco, or electronic cigarettes. If you need help quitting, ask your doctor. Contact a doctor if:  You are getting worse, not better.  Your symptoms are not helped by medicine.  You have chills.  You are getting more short of breath.  You have brown or red mucus.  You have yellow or brown discharge from your nose.  You have pain in your face, especially when you bend forward.  You have a fever.  You have puffy (swollen) neck glands.  You have pain while swallowing.  You have white areas in the back of your throat. Get help right away if:  You have very bad or constant: ? Headache. ? Ear pain. ? Pain in your forehead, behind your eyes, and over your cheekbones (sinus pain). ? Chest pain.  You have long-lasting (chronic) lung disease and any of the following: ? Wheezing. ? Long-lasting cough. ? Coughing up blood. ? A change in your usual mucus.  You have a stiff neck.  You have  changes in your: ? Vision. ? Hearing. ? Thinking. ? Mood. This information is not intended to replace advice given to you by your health care provider. Make sure you discuss any questions you have with your health care provider. Document Released: 01/01/2008 Document Revised: 03/17/2016 Document Reviewed: 10/20/2013 Elsevier Interactive Patient Education  2018 [employer]  

## 2017-06-24 DIAGNOSIS — F3340 Major depressive disorder, recurrent, in remission, unspecified: Secondary | ICD-10-CM | POA: Diagnosis not present

## 2017-06-25 ENCOUNTER — Telehealth: Payer: Self-pay | Admitting: Physician Assistant

## 2017-06-25 NOTE — Telephone Encounter (Signed)
Pt was seen on 06/23/17 and was advised to call back if not feeling better and an Rx for antibiotic would be sent to [facility]. Pt stated she isn't ant better and she took her son to the doctor today and he was Dx with a sinus infection. Pt would like to get the Rx today if possible. Please advise. Thanks TNP

## 2017-06-25 NOTE — Telephone Encounter (Signed)
Saw her in office on 06/23/2017. She is still in viral window of her URI, which lasts 7-10 days and an antibiotic is not helpful at this point, regardless of whether her son has a sinus infection. Instructed her to call back in a week, on 06/30/2017  if not improving because at this point the infection is likely bacterial. An antibiotic would be appropriate then and I will happily call it in.

## 2017-06-25 NOTE — Telephone Encounter (Signed)
Patient has been advised. KW 

## 2017-07-31 DIAGNOSIS — Z79891 Long term (current) use of opiate analgesic: Secondary | ICD-10-CM | POA: Diagnosis not present

## 2017-07-31 DIAGNOSIS — Z5181 Encounter for therapeutic drug level monitoring: Secondary | ICD-10-CM | POA: Diagnosis not present

## 2017-07-31 DIAGNOSIS — M961 Postlaminectomy syndrome, not elsewhere classified: Secondary | ICD-10-CM | POA: Diagnosis not present

## 2017-07-31 DIAGNOSIS — M5417 Radiculopathy, lumbosacral region: Secondary | ICD-10-CM | POA: Diagnosis not present

## 2017-09-08 ENCOUNTER — Other Ambulatory Visit: Payer: Self-pay | Admitting: Family Medicine

## 2017-09-16 DIAGNOSIS — F3340 Major depressive disorder, recurrent, in remission, unspecified: Secondary | ICD-10-CM | POA: Diagnosis not present

## 2017-10-09 ENCOUNTER — Other Ambulatory Visit
Admission: RE | Admit: 2017-10-09 | Discharge: 2017-10-09 | Disposition: A | Discharge status: Home / Self Care | Payer: 59 | Source: Ambulatory Visit | Attending: Pain Medicine | Admitting: Pain Medicine

## 2017-10-09 DIAGNOSIS — Z5181 Encounter for therapeutic drug level monitoring: Secondary | ICD-10-CM | POA: Diagnosis not present

## 2017-10-14 LAB — DRUG PROFILE, UR, 9 DRUGS (LABCORP)
Amphetamines, Urine: NEGATIVE ng/mL
Barbiturate, Ur: NEGATIVE ng/mL
Benzodiazepine Quant, Ur: NEGATIVE
Cannabinoid Quant, Ur: NEGATIVE ng/mL
Cocaine (Metab.): NEGATIVE ng/mL
Methadone Screen, Urine: NEGATIVE ng/mL
Opiate Quant, Ur: POSITIVE — AB
Phencyclidine, Ur: NEGATIVE ng/mL
Propoxyphene, Urine: NEGATIVE ng/mL

## 2017-10-23 DIAGNOSIS — M961 Postlaminectomy syndrome, not elsewhere classified: Secondary | ICD-10-CM | POA: Diagnosis not present

## 2017-10-23 DIAGNOSIS — Z79891 Long term (current) use of opiate analgesic: Secondary | ICD-10-CM | POA: Diagnosis not present

## 2017-10-23 DIAGNOSIS — Z5181 Encounter for therapeutic drug level monitoring: Secondary | ICD-10-CM | POA: Diagnosis not present

## 2017-10-23 DIAGNOSIS — M5417 Radiculopathy, lumbosacral region: Secondary | ICD-10-CM | POA: Diagnosis not present

## 2017-10-24 ENCOUNTER — Other Ambulatory Visit: Payer: Self-pay | Admitting: Family Medicine

## 2017-10-29 ENCOUNTER — Ambulatory Visit: Payer: 59 | Admitting: Physician Assistant

## 2017-10-29 ENCOUNTER — Encounter: Payer: Self-pay | Admitting: Physician Assistant

## 2017-10-29 VITALS — BP 112/78 | HR 95 | Temp 98.4°F | Wt 247.4 lb

## 2017-10-29 DIAGNOSIS — R112 Nausea with vomiting, unspecified: Secondary | ICD-10-CM

## 2017-10-29 DIAGNOSIS — A084 Viral intestinal infection, unspecified: Secondary | ICD-10-CM | POA: Diagnosis not present

## 2017-10-29 DIAGNOSIS — R51 Headache: Secondary | ICD-10-CM | POA: Diagnosis not present

## 2017-10-29 DIAGNOSIS — R519 Headache, unspecified: Secondary | ICD-10-CM

## 2017-10-29 MED ORDER — ZOLMITRIPTAN 2.5 MG PO TBDP: ORAL_TABLET | ORAL | 0 refills | Status: DC

## 2017-10-29 MED ORDER — PROMETHAZINE HCL 6.25 MG/5ML PO SYRP: 12.5000 mg | ORAL_SOLUTION | Freq: Four times a day (QID) | ORAL | 0 refills | Status: DC | PRN

## 2017-10-29 NOTE — Progress Notes (Signed)
Patient: Michaela Jones Female    DOB: March 19, 1976   42 y.o.   MRN: [id number] Visit Date: 10/29/2017  Today's Provider: Trinna Post, PA-C   Chief Complaint  Patient presents with  . URI   Subjective:    Michaela Jones is a 42 y/o woman with history of Roux en Y gastric bypass presenting today for nausea, vomiting, and headache. She reports she experienced vomiting, diarrhea and fever on Saturday and Sunday. She says friend, whom she was out to dinner with, had child with similar symptoms. Denies blood in her stool, no recent travel, hospitalizations or antibiotics. Vomiting is improving, but still remains nauseated.  Also has headache. She has had migraines for years but has not needed abortive medication in a while. Has previously had zomig disintegrating tab.  URI   This is a new problem. Episode onset: Sunday. The problem has been gradually worsening. The maximum temperature recorded prior to her arrival was 102 - 102.9 F. The fever has been present for 1 to 2 days. Associated symptoms include congestion, diarrhea, nausea, a sore throat and vomiting. Associated symptoms comments: Fever . She has tried acetaminophen and NSAIDs for the symptoms. The treatment provided mild relief.       Allergies  Allergen Reactions  . Lyrica [Pregabalin] Other (See Comments)    Patient reports neurological problem. Speech disturbances and unable to control motor skills  . Gabapentin Swelling  . Nsaids Other (See Comments)    Avoids due to gastric bypass  . Penicillins Rash     Current Outpatient Medications:  .  cyanocobalamin (,VITAMIN B-12,) 1000 MCG/ML injection, INJECT 1 ML ONCE MONTHLY, Disp: 6 mL, Rfl: 2 .  Cyanocobalamin 1000 MCG/ML KIT, Inject 1 Dose as directed every 30 (thirty) days. , Disp: , Rfl:  .  fluticasone (FLONASE) 50 MCG/ACT nasal spray, Place 2 sprays into both nostrils daily., Disp: 16 g, Rfl: 6 .  levothyroxine (SYNTHROID, LEVOTHROID) 112 MCG tablet, TAKE 1  TABLET BY MOUTH ONCE DAILY, Disp: 90 tablet, Rfl: 0 .  naloxone (NARCAN) nasal spray 4 mg/0.1 mL, Place into the nose., Disp: , Rfl:  .  oxymorphone (OPANA) 10 MG tablet, Take 5-10 mg by mouth 3 (three) times daily. , Disp: , Rfl:  .  albuterol (PROVENTIL HFA;VENTOLIN HFA) 108 (90 Base) MCG/ACT inhaler, Inhale 2 puffs into the lungs every 6 (six) hours as needed for wheezing or shortness of breath., Disp: , Rfl:  .  albuterol (PROVENTIL) (5 MG/ML) 0.5% nebulizer solution, Inhale into the lungs., Disp: , Rfl:  .  ALPRAZolam (XANAX) 1 MG tablet, , Disp: , Rfl: 0 .  celecoxib (CELEBREX) 200 MG capsule, Take 200 mg by mouth as needed. , Disp: , Rfl:  .  clonazePAM (KLONOPIN) 1 MG tablet, Take 1 mg by mouth at bedtime. , Disp: , Rfl: 0 .  cyclobenzaprine (FLEXERIL) 10 MG tablet, Take 1 tablet (10 mg total) by mouth 3 (three) times daily as needed for muscle spasms. (Patient not taking: Reported on 10/29/2017), Disp: 30 tablet, Rfl: 0 .  modafinil (PROVIGIL) 100 MG tablet, Take 100 mg by mouth daily., Disp: , Rfl:  .  naloxegol oxalate (MOVANTIK) 25 MG TABS tablet, Take 1 tablet (25 mg total) by mouth daily., Disp: 30 tablet, Rfl: 3 .  promethazine (PHENERGAN) 25 MG tablet, TAKE 1/2 TO 1 TABLET BY MOUTH EVERY 6 HOURS AS NEEED FOR NAUSEA, Disp: 10 tablet, Rfl: 6 .  rizatriptan (MAXALT) 5 MG  tablet, Take 1 tablet (5 mg total) by mouth as needed for migraine. May repeat in 2 hours if needed, Disp: 6 tablet, Rfl: 5 .  sertraline (ZOLOFT) 100 MG tablet, Take 100 mg by mouth at bedtime. , Disp: , Rfl: 0  Review of Systems  HENT: Positive for congestion and sore throat.   Gastrointestinal: Positive for diarrhea, nausea and vomiting.    Social History   Tobacco Use  . Smoking status: Never Smoker  . Smokeless tobacco: Never Used  Substance Use Topics  . Alcohol use: Yes    Alcohol/week: 0.0 oz    Comment: SOCIALLY   Objective:   BP 112/78 (BP Location: Right Arm, Patient Position: Sitting, Cuff Size:  Normal)   Pulse 95   Temp 98.4 F (36.9 C) (Oral)   Wt 247 lb 6.4 oz (112.2 kg)   SpO2 98%   BMI 39.93 kg/m  Vitals:   10/29/17 1111  BP: 112/78  Pulse: 95  Temp: 98.4 F (36.9 C)  TempSrc: Oral  SpO2: 98%  Weight: 247 lb 6.4 oz (112.2 kg)     Physical Exam  Constitutional: She appears well-developed and well-nourished.  HENT:  Right Ear: External ear normal.  Left Ear: External ear normal.  Mouth/Throat: Oropharynx is clear and moist. No oropharyngeal exudate.  Eyes: Conjunctivae are normal.  Neck: Neck supple.  Cardiovascular: Normal rate and regular rhythm.  Pulmonary/Chest: Effort normal and breath sounds normal.  Abdominal: Soft. She exhibits no distension. Bowel sounds are increased. There is no tenderness. There is no rebound and no guarding.  Lymphadenopathy:    She has no cervical adenopathy.        Assessment & Plan:     1. Nausea and vomiting, intractability of vomiting not specified, unspecified vomiting type  - promethazine (PHENERGAN) 6.25 MG/5ML syrup; Take 10 mLs (12.5 mg total) by mouth every 6 (six) hours as needed for nausea or vomiting.  Dispense: 120 mL; Refill: 0  2. Viral gastroenteritis   3. Nonintractable headache, unspecified chronicity pattern, unspecified headache type  - zolmitriptan (ZOMIG-ZMT) 2.5 MG disintegrating tablet; Take first dose at onset of headache. May take second dose two hours later.  Dispense: 10 tablet; Refill: 0  I have spent 25 minutes with this patient, >50% of which was spent on counseling and coordination of care.       Trinna Post, PA-C  [facility]

## 2017-10-29 NOTE — Patient Instructions (Signed)

## 2017-12-03 ENCOUNTER — Encounter: Payer: 59 | Admitting: Family Medicine

## 2017-12-04 ENCOUNTER — Encounter: Payer: 59 | Admitting: Physician Assistant

## 2017-12-10 ENCOUNTER — Ambulatory Visit (INDEPENDENT_AMBULATORY_CARE_PROVIDER_SITE_OTHER): Payer: 59 | Admitting: Physician Assistant

## 2017-12-10 ENCOUNTER — Encounter: Payer: Self-pay | Admitting: Physician Assistant

## 2017-12-10 VITALS — BP 114/76 | HR 84 | Temp 98.8°F | Resp 16 | Ht 66.0 in | Wt 243.0 lb

## 2017-12-10 DIAGNOSIS — Z114 Encounter for screening for human immunodeficiency virus [HIV]: Secondary | ICD-10-CM

## 2017-12-10 DIAGNOSIS — Z1329 Encounter for screening for other suspected endocrine disorder: Secondary | ICD-10-CM

## 2017-12-10 DIAGNOSIS — R61 Generalized hyperhidrosis: Secondary | ICD-10-CM | POA: Diagnosis not present

## 2017-12-10 DIAGNOSIS — Z23 Encounter for immunization: Secondary | ICD-10-CM | POA: Diagnosis not present

## 2017-12-10 DIAGNOSIS — Z0000 Encounter for general adult medical examination without abnormal findings: Secondary | ICD-10-CM

## 2017-12-10 DIAGNOSIS — Z9884 Bariatric surgery status: Secondary | ICD-10-CM

## 2017-12-10 DIAGNOSIS — N912 Amenorrhea, unspecified: Secondary | ICD-10-CM | POA: Diagnosis not present

## 2017-12-10 NOTE — Progress Notes (Signed)
Patient: Michaela Jones, Female    DOB: 04/15/1976, 42 y.o.   MRN: [id number] Visit Date: 12/12/2017  Today's Provider: Trinna Post, PA-C   Chief Complaint  Patient presents with  . Annual Exam   Subjective:    Annual physical exam Michaela Jones is a 42 y.o. female who presents today for health maintenance and complete physical. She feels well. She reports exercising never. She reports she is sleeping well.  History of hypothyroidism, currently taking levothyroxine 112 mcg daily.   She has a history of anxiety, sees [facility]. Gets zoloft, anxiety and provigil from them.  She has chronic back pain and is seen at pain management, by Jude Robina Ade, PA-C.  She has a history of uterine ablation and hasn't had a menstrual cycle in 5 years. She wants to know if she can get tested for menopause because she has experienced sweating lately. She said her mom experienced menopause at age 52.    -----------------------------------------------------------------   Review of Systems  Constitutional: Positive for diaphoresis and fatigue. Negative for activity change, appetite change, chills, fever and unexpected weight change.  HENT: Negative.   Eyes: Negative.   Respiratory: Negative.   Cardiovascular: Positive for leg swelling. Negative for chest pain and palpitations.  Gastrointestinal: Negative.   Endocrine: Negative.   Genitourinary: Negative.   Musculoskeletal: Negative.   Skin: Negative.   Allergic/Immunologic: Positive for environmental allergies.  Neurological: Positive for headaches.  Hematological: Negative.   Psychiatric/Behavioral: Positive for sleep disturbance. Negative for agitation, behavioral problems, confusion, decreased concentration, dysphoric mood, hallucinations, self-injury and suicidal ideas. The patient is not nervous/anxious and is not hyperactive.     Social History      She  reports that she has never smoked. She has never used smokeless  tobacco. She reports that she drinks alcohol. She reports that she does not use drugs.       Social History   Socioeconomic History  . Marital status: Married    Spouse name: Not on file  . Number of children: Not on file  . Years of education: Not on file  . Highest education level: Not on file  Occupational History  . Not on file  Social Needs  . Financial resource strain: Not on file  . Food insecurity:    Worry: Not on file    Inability: Not on file  . Transportation needs:    Medical: Not on file    Non-medical: Not on file  Tobacco Use  . Smoking status: Never Smoker  . Smokeless tobacco: Never Used  Substance and Sexual Activity  . Alcohol use: Yes    Alcohol/week: 0.0 oz    Comment: SOCIALLY  . Drug use: No  . Sexual activity: Not on file  Lifestyle  . Physical activity:    Days per week: Not on file    Minutes per session: Not on file  . Stress: Not on file  Relationships  . Social connections:    Talks on phone: Not on file    Gets together: Not on file    Attends religious service: Not on file    Active member of club or organization: Not on file    Attends meetings of clubs or organizations: Not on file    Relationship status: Not on file  Other Topics Concern  . Not on file  Social History Narrative  . Not on file    Past Medical History:  Diagnosis Date  .  Anemia   . Chronic back pain   . Complication of anesthesia    HARD TO WAKE UP/OXYGEN LEVEL DROPPED AFTER BACK SURGERY X1  . Constipation, chronic   . Depression   . Edema   . History of seasonal allergies    pt has inhalers for this if needed  . Hypotension   . Hypothyroid   . Insomnia   . Lymphedema of both lower extremities   . Migraine   . Restless leg syndrome   . Vitamin D deficiency      Patient Active Problem List   Diagnosis Date Noted  . Lumbar postlaminectomy syndrome 10/23/2017  . Lumbosacral radiculopathy 10/23/2017  . Vitamin D deficiency 03/01/2015  . Acquired  spondylolisthesis 03/01/2015  . Elevated LDL cholesterol level 03/01/2015  . Chronic constipation 03/01/2015  . Back pain, chronic 03/01/2015  . Dysthymia 11/11/2013  . Absolute anemia 10/15/2012  . Bariatric surgery status 10/15/2012  . Gastric bypass status for obesity 10/15/2012  . Insomnia 09/03/2007  . Allergic rhinitis 06/15/2007  . Restless leg 09/23/2006  . Headache, migraine 08/27/2006  . Acquired lymphedema 07/29/1998  . Adult hypothyroidism 07/29/1992    Past Surgical History:  Procedure Laterality Date  . ABLATION  2014  . BACK SURGERY  10/2012  . BREAST ENHANCEMENT SURGERY  07/2008  . BREAST SURGERY  09/05/2011   removal of implants  . CHOLECYSTECTOMY N/A 04/16/2016   Procedure: LAPAROSCOPIC CHOLECYSTECTOMY WITH INTRAOPERATIVE CHOLANGIOGRAM;  Surgeon: Robert Bellow, MD;  Location: [facility] ORS;  Service: General;  Laterality: N/A;  . GASTRIC BYPASS  2002  . Lower body  01/2008, 03/2008   lift legs and thighs  . tummy tuck  2004    Family History        Family Status  Relation Name Status  . Mother  Deceased at age 77       cause of death PE and asthma exasperation  . Father  Deceased at age 71       cause of death MI        Her family history includes Asthma in her mother; Cancer in her father; Depression in her mother; Heart attack in her father; Pulmonary embolism in her mother.      Allergies  Allergen Reactions  . Lyrica [Pregabalin] Other (See Comments)    Patient reports neurological problem. Speech disturbances and unable to control motor skills  . Gabapentin Swelling  . Nsaids Other (See Comments)    Avoids due to gastric bypass  . Penicillins Rash     Current Outpatient Medications:  .  albuterol (PROVENTIL HFA;VENTOLIN HFA) 108 (90 Base) MCG/ACT inhaler, Inhale 2 puffs into the lungs every 6 (six) hours as needed for wheezing or shortness of breath., Disp: , Rfl:  .  albuterol (PROVENTIL) (5 MG/ML) 0.5% nebulizer solution, Inhale into the  lungs., Disp: , Rfl:  .  ALPRAZolam (XANAX) 1 MG tablet, , Disp: , Rfl: 0 .  celecoxib (CELEBREX) 200 MG capsule, Take 200 mg by mouth as needed. , Disp: , Rfl:  .  cyanocobalamin (,VITAMIN B-12,) 1000 MCG/ML injection, INJECT 1 ML ONCE MONTHLY, Disp: 6 mL, Rfl: 2 .  Cyanocobalamin 1000 MCG/ML KIT, Inject 1 Dose as directed every 30 (thirty) days. , Disp: , Rfl:  .  cyclobenzaprine (FLEXERIL) 10 MG tablet, Take 1 tablet (10 mg total) by mouth 3 (three) times daily as needed for muscle spasms., Disp: 30 tablet, Rfl: 0 .  fluticasone (FLONASE) 50 MCG/ACT nasal spray, Place 2  sprays into both nostrils daily., Disp: 16 g, Rfl: 6 .  levothyroxine (SYNTHROID, LEVOTHROID) 112 MCG tablet, TAKE 1 TABLET BY MOUTH ONCE DAILY, Disp: 90 tablet, Rfl: 0 .  modafinil (PROVIGIL) 100 MG tablet, Take 100 mg by mouth daily., Disp: , Rfl:  .  naloxegol oxalate (MOVANTIK) 25 MG TABS tablet, Take 1 tablet (25 mg total) by mouth daily., Disp: 30 tablet, Rfl: 3 .  naloxone (NARCAN) nasal spray 4 mg/0.1 mL, Place into the nose., Disp: , Rfl:  .  oxymorphone (OPANA) 10 MG tablet, Take 5-10 mg by mouth 3 (three) times daily. , Disp: , Rfl:  .  promethazine (PHENERGAN) 6.25 MG/5ML syrup, Take 10 mLs (12.5 mg total) by mouth every 6 (six) hours as needed for nausea or vomiting., Disp: 120 mL, Rfl: 0 .  sertraline (ZOLOFT) 100 MG tablet, Take 100 mg by mouth at bedtime. , Disp: , Rfl: 0 .  zolmitriptan (ZOMIG-ZMT) 2.5 MG disintegrating tablet, Take first dose at onset of headache. May take second dose two hours later., Disp: 10 tablet, Rfl: 0   Patient Care Team: Paulene Floor as PCP - General (Physician Assistant) Carmon Ginsberg, PA as Referring Physician (Family Medicine) Bary Castilla Forest Gleason, MD (General Surgery)      Objective:   Vitals: BP 114/76 (BP Location: Right Arm, Patient Position: Sitting, Cuff Size: Large)   Pulse 84   Temp 98.8 F (37.1 C) (Oral)   Resp 16   Ht 5' 6"  (1.676 m)   Wt 243 lb  (110.2 kg)   BMI 39.22 kg/m    Vitals:   12/10/17 0926  BP: 114/76  Pulse: 84  Resp: 16  Temp: 98.8 F (37.1 C)  TempSrc: Oral  Weight: 243 lb (110.2 kg)  Height: 5' 6"  (1.676 m)     Physical Exam   Depression Screen No flowsheet data found.    Assessment & Plan:     Routine Health Maintenance and Physical Exam  Exercise Activities and Dietary recommendations Goals    None      Immunization History  Administered Date(s) Administered  . Influenza Split 06/13/2007, 05/07/2008, 06/26/2009  . Influenza,inj,Quad PF,6+ Mos 04/19/2013, 04/12/2014, 05/26/2015  . Td 12/10/2017  . Tdap 08/27/2006    Health Maintenance  Topic Date Due  . HIV Screening  11/14/1990  . INFLUENZA VACCINE  02/26/2018  . PAP SMEAR  11/22/2019  . TETANUS/TDAP  12/11/2027     Discussed health benefits of physical activity, and encouraged her to engage in regular exercise appropriate for her age and condition.    1. Annual physical exam   2. Need for tetanus booster  - Td : Tetanus/diphtheria >7yo Preservative  free  3. Gastric bypass status for obesity  - Comprehensive Metabolic Panel (CMET) - CBC with Differential - Lipid Profile - Vitamin B1 - B12 - Vitamin D (25 hydroxy) - Zinc - Fe+TIBC+Fer  4. Thyroid disorder screening  - TSH  5. Encounter for screening for HIV  - HIV antibody (with reflex)  6. Amenorrhea  - FSH/LH - Estradiol  7. Diaphoresis  Return in about 1 year (around 12/11/2018) for CPE.  The entirety of the information documented in the History of Present Illness, Review of Systems and Physical Exam were personally obtained by me. Portions of this information were initially documented by Ashley Royalty, CMA and reviewed by me for thoroughness and accuracy.    --------------------------------------------------------------------    Trinna Post, PA-C  [facility]

## 2017-12-17 ENCOUNTER — Encounter: Payer: Self-pay | Admitting: Physician Assistant

## 2017-12-17 ENCOUNTER — Ambulatory Visit: Payer: 59 | Admitting: Physician Assistant

## 2017-12-17 VITALS — BP 118/80 | HR 84 | Temp 98.9°F | Resp 16 | Wt 242.0 lb

## 2017-12-17 DIAGNOSIS — R0981 Nasal congestion: Secondary | ICD-10-CM

## 2017-12-17 DIAGNOSIS — R062 Wheezing: Secondary | ICD-10-CM

## 2017-12-17 DIAGNOSIS — J069 Acute upper respiratory infection, unspecified: Secondary | ICD-10-CM | POA: Diagnosis not present

## 2017-12-17 MED ORDER — PROMETHAZINE-DM 6.25-15 MG/5ML PO SYRP: 5.0000 mL | ORAL_SOLUTION | Freq: Every evening | ORAL | 0 refills | Status: DC | PRN

## 2017-12-17 MED ORDER — FLUTICASONE PROPIONATE 50 MCG/ACT NA SUSP: 2.0000 | Freq: Every day | NASAL | 6 refills | Status: DC

## 2017-12-17 NOTE — Progress Notes (Signed)
[place]  Chief Complaint  Patient presents with  . URI    Started about seven days ago.  Worsening since Sunday    Subjective:    Patient ID: Michaela Jones, female    DOB: 1976-03-26, 42 y.o.   MRN: [id number]  Upper Respiratory Infection: Michaela Jones is a 42 y.o. female with a past medical history complaining of symptoms of a URI, possible sinusitis. Symptoms include congestion, cough and plugged sensation in both ears. Onset of symptoms was 7 days ago, gradually worsening since that time. She also c/o congestion, cough described as productive, post nasal drip and shortness of breath for the past 3 days .  She is drinking plenty of fluids. Evaluation to date: none. Treatment to date: cough suppressants and decongestants. The treatment has provided minimal.   Review of Systems  Constitutional: Positive for fatigue. Negative for activity change, appetite change, chills, diaphoresis, fever and unexpected weight change.  HENT: Positive for sinus pressure and sinus pain.   Respiratory: Positive for cough, chest tightness, shortness of breath and wheezing. Negative for apnea, choking and stridor.   Gastrointestinal: Negative.   Neurological: Positive for headaches. Negative for dizziness and light-headedness.       Objective:   BP 118/80 (BP Location: Right Arm, Patient Position: Sitting, Cuff Size: Large)   Pulse 84   Temp 98.9 F (37.2 C) (Oral)   Resp 16   Wt 242 lb (109.8 kg)   SpO2 97%   BMI 39.06 kg/m   Patient Active Problem List   Diagnosis Date Noted  . Lumbar postlaminectomy syndrome 10/23/2017  . Lumbosacral radiculopathy 10/23/2017  . Vitamin D deficiency 03/01/2015  . Acquired spondylolisthesis 03/01/2015  . Elevated LDL cholesterol level 03/01/2015  . Chronic constipation 03/01/2015  . Back pain, chronic 03/01/2015  . Dysthymia 11/11/2013  . Absolute anemia 10/15/2012  . Bariatric surgery status 10/15/2012  . Gastric  bypass status for obesity 10/15/2012  . Insomnia 09/03/2007  . Allergic rhinitis 06/15/2007  . Restless leg 09/23/2006  . Headache, migraine 08/27/2006  . Acquired lymphedema 07/29/1998  . Adult hypothyroidism 07/29/1992    Outpatient Encounter Medications as of 12/17/2017  Medication Sig  . albuterol (PROVENTIL HFA;VENTOLIN HFA) 108 (90 Base) MCG/ACT inhaler Inhale 2 puffs into the lungs every 6 (six) hours as needed for wheezing or shortness of breath.  Marland Kitchen albuterol (PROVENTIL) (5 MG/ML) 0.5% nebulizer solution Inhale into the lungs.  . ALPRAZolam (XANAX) 1 MG tablet   . celecoxib (CELEBREX) 200 MG capsule Take 200 mg by mouth as needed.   . cyanocobalamin (,VITAMIN B-12,) 1000 MCG/ML injection INJECT 1 ML ONCE MONTHLY  . Cyanocobalamin 1000 MCG/ML KIT Inject 1 Dose as directed every 30 (thirty) days.   . cyclobenzaprine (FLEXERIL) 10 MG tablet Take 1 tablet (10 mg total) by mouth 3 (three) times daily as needed for muscle spasms.  . fluticasone (FLONASE) 50 MCG/ACT nasal spray Place 2 sprays into both nostrils daily.  Marland Kitchen levothyroxine (SYNTHROID, LEVOTHROID) 112 MCG tablet TAKE 1 TABLET BY MOUTH ONCE DAILY  . modafinil (PROVIGIL) 100 MG tablet Take 100 mg by mouth daily.  . naloxegol oxalate (MOVANTIK) 25 MG TABS tablet Take 1 tablet (25 mg total) by mouth daily.  . naloxone (NARCAN) nasal spray 4 mg/0.1 mL Place into the nose.  Marland Kitchen oxymorphone (OPANA) 10 MG tablet Take 5-10 mg by mouth 3 (three) times daily.   . promethazine (PHENERGAN) 6.25 MG/5ML syrup Take 10 mLs (12.5 mg total)  by mouth every 6 (six) hours as needed for nausea or vomiting.  . promethazine-dextromethorphan (PROMETHAZINE-DM) 6.25-15 MG/5ML syrup Take 5 mLs by mouth at bedtime as needed for cough.  . sertraline (ZOLOFT) 100 MG tablet Take 100 mg by mouth at bedtime.   . zolmitriptan (ZOMIG-ZMT) 2.5 MG disintegrating tablet Take first dose at onset of headache. May take second dose two hours later.  . [DISCONTINUED]  fluticasone (FLONASE) 50 MCG/ACT nasal spray Place 2 sprays into both nostrils daily.   No facility-administered encounter medications on file as of 12/17/2017.     Allergies  Allergen Reactions  . Lyrica [Pregabalin] Other (See Comments)    Patient reports neurological problem. Speech disturbances and unable to control motor skills  . Gabapentin Swelling  . Nsaids Other (See Comments)    Avoids due to gastric bypass  . Penicillins Rash       Physical Exam  Constitutional: She is oriented to person, place, and time. She appears well-developed and well-nourished.  HENT:  Right Ear: External ear normal.  Left Ear: External ear normal.  Mouth/Throat: Oropharynx is clear and moist. No oropharyngeal exudate.  Eyes: Right eye exhibits discharge. Left eye exhibits discharge.  Neck: Neck supple.  Cardiovascular: Normal rate and regular rhythm.  Pulmonary/Chest: Effort normal and breath sounds normal. No respiratory distress. She has no wheezes. She has no rales.  Lymphadenopathy:    She has no cervical adenopathy.  Neurological: She is alert and oriented to person, place, and time.  Skin: Skin is warm and dry.  Psychiatric: She has a normal mood and affect. Her behavior is normal.       Assessment & Plan:  1. Upper respiratory tract infection, unspecified type  Viral for now. Call back in one week for abx if not improving.   - promethazine-dextromethorphan (PROMETHAZINE-DM) 6.25-15 MG/5ML syrup; Take 5 mLs by mouth at bedtime as needed for cough.  Dispense: 118 mL; Refill: 0  2. Wheeze   3. Nasal congestion  - fluticasone (FLONASE) 50 MCG/ACT nasal spray; Place 2 sprays into both nostrils daily.  Dispense: 16 g; Refill: 6  Return if symptoms worsen or fail to improve.   The entirety of the information documented in the History of Present Illness, Review of Systems and Physical Exam were personally obtained by me. Portions of this information were initially documented by  Laura Walsh, CMA and reviewed by me for thoroughness and accuracy.    

## 2017-12-17 NOTE — Patient Instructions (Signed)
Upper Respiratory Infection, Adult Most upper respiratory infections (URIs) are caused by a virus. A URI affects the nose, throat, and upper air passages. The most common type of URI is often called "the common cold." Follow these instructions at home:  Take medicines only as told by your doctor.  Gargle warm saltwater or take cough drops to comfort your throat as told by your doctor.  Use a warm mist humidifier or inhale steam from a shower to increase air moisture. This may make it easier to breathe.  Drink enough fluid to keep your pee (urine) clear or pale yellow.  Eat soups and other clear broths.  Have a healthy diet.  Rest as needed.  Go back to work when your fever is gone or your doctor says it is okay. ? You may need to stay home longer to avoid giving your URI to others. ? You can also wear a face mask and wash your hands often to prevent spread of the virus.  Use your inhaler more if you have asthma.  Do not use any tobacco products, including cigarettes, chewing tobacco, or electronic cigarettes. If you need help quitting, ask your doctor. Contact a doctor if:  You are getting worse, not better.  Your symptoms are not helped by medicine.  You have chills.  You are getting more short of breath.  You have brown or red mucus.  You have yellow or brown discharge from your nose.  You have pain in your face, especially when you bend forward.  You have a fever.  You have puffy (swollen) neck glands.  You have pain while swallowing.  You have white areas in the back of your throat. Get help right away if:  You have very bad or constant: ? Headache. ? Ear pain. ? Pain in your forehead, behind your eyes, and over your cheekbones (sinus pain). ? Chest pain.  You have long-lasting (chronic) lung disease and any of the following: ? Wheezing. ? Long-lasting cough. ? Coughing up blood. ? A change in your usual mucus.  You have a stiff neck.  You have  changes in your: ? Vision. ? Hearing. ? Thinking. ? Mood. This information is not intended to replace advice given to you by your health care provider. Make sure you discuss any questions you have with your health care provider. Document Released: 01/01/2008 Document Revised: 03/17/2016 Document Reviewed: 10/20/2013 Elsevier Interactive Patient Education  2018 [employer]  

## 2017-12-26 ENCOUNTER — Telehealth: Payer: Self-pay

## 2017-12-26 ENCOUNTER — Telehealth: Payer: Self-pay | Admitting: Physician Assistant

## 2017-12-26 DIAGNOSIS — J0110 Acute frontal sinusitis, unspecified: Secondary | ICD-10-CM

## 2017-12-26 MED ORDER — DOXYCYCLINE HYCLATE 100 MG PO TABS: 100.0000 mg | ORAL_TABLET | Freq: Two times a day (BID) | ORAL | 0 refills | Status: AC

## 2017-12-26 NOTE — Telephone Encounter (Signed)
I've sent a work note, should be available through [employer]. I sent in doxycycline. Don't fax notes to non secure fax numbers. Other option is to leave work note up front for pick up.

## 2017-12-26 NOTE — Telephone Encounter (Signed)
Patient called office to give update, she states that her symptoms have not improved and is still using cough syrup, inhaler, nasal spray and zyrtec. Patient states symptoms are not worse but since it has been a week and she has been feeling lethargic she is requesting a antibiotic be sent to her pharmacy. Patient is also requesting a work/school excuse letter for 12/25/17-12/26/17. Patient request that note be faxed to her husband Harrold Donath [phone]. Patient CB# [phone], she request call back from CMA.

## 2017-12-26 NOTE — Telephone Encounter (Signed)
Tried calling patient at ALL the numbers listed and no answer.

## 2017-12-26 NOTE — Telephone Encounter (Signed)
Patient is calling office back again  requesting that we fax note to her husband Harrold Donathathan excusing her from work/school from the dates of 5/30-5/31. Fax # [phone]. She is requesting a call back from CMA when faxed. KW [phone]

## 2017-12-26 NOTE — Telephone Encounter (Signed)
Pt was in last Wednesday and was diagnosed with a viral cold and told to call back if it got worse.  It has gotten worse and she thinks she needs an antibiotic.  She also needs a note for work for the last two days  Please fax note to the [facility] pharmacy where her husband Harrold Donath works...  She uses [facility] pharmacy  Pt's call back is [phone]  Barth Kirks

## 2017-12-26 NOTE — Telephone Encounter (Signed)
Sent doxycycline 100 mg BID x 7 days to [facility] employee pharmacy.

## 2017-12-29 ENCOUNTER — Other Ambulatory Visit: Payer: Self-pay | Admitting: Physician Assistant

## 2017-12-29 NOTE — Progress Notes (Signed)
New work note

## 2018-01-16 DIAGNOSIS — F3340 Major depressive disorder, recurrent, in remission, unspecified: Secondary | ICD-10-CM | POA: Diagnosis not present

## 2018-01-21 DIAGNOSIS — Z5181 Encounter for therapeutic drug level monitoring: Secondary | ICD-10-CM | POA: Diagnosis not present

## 2018-01-21 DIAGNOSIS — Z79891 Long term (current) use of opiate analgesic: Secondary | ICD-10-CM | POA: Diagnosis not present

## 2018-01-21 DIAGNOSIS — M961 Postlaminectomy syndrome, not elsewhere classified: Secondary | ICD-10-CM | POA: Diagnosis not present

## 2018-01-21 DIAGNOSIS — M5417 Radiculopathy, lumbosacral region: Secondary | ICD-10-CM | POA: Diagnosis not present

## 2018-01-27 ENCOUNTER — Ambulatory Visit: Payer: Self-pay | Admitting: Physician Assistant

## 2018-02-03 ENCOUNTER — Ambulatory Visit: Payer: Self-pay | Admitting: Physician Assistant

## 2018-02-05 ENCOUNTER — Ambulatory Visit: Payer: Self-pay | Admitting: Physician Assistant

## 2018-02-23 ENCOUNTER — Emergency Department
Admission: EM | Admit: 2018-02-23 | Discharge: 2018-02-23 | Disposition: A | Discharge status: Home / Self Care | Payer: 59 | Attending: Emergency Medicine | Admitting: Emergency Medicine

## 2018-02-23 ENCOUNTER — Emergency Department: Payer: 59

## 2018-02-23 DIAGNOSIS — Z79899 Other long term (current) drug therapy: Secondary | ICD-10-CM | POA: Insufficient documentation

## 2018-02-23 DIAGNOSIS — E039 Hypothyroidism, unspecified: Secondary | ICD-10-CM | POA: Insufficient documentation

## 2018-02-23 DIAGNOSIS — R51 Headache: Secondary | ICD-10-CM | POA: Diagnosis not present

## 2018-02-23 DIAGNOSIS — R569 Unspecified convulsions: Secondary | ICD-10-CM | POA: Diagnosis not present

## 2018-02-23 LAB — CBC
HCT: 36.2 % (ref 35.0–47.0)
Hemoglobin: 12 g/dL (ref 12.0–16.0)
MCH: 29.9 pg (ref 26.0–34.0)
MCHC: 33.1 g/dL (ref 32.0–36.0)
MCV: 90.3 fL (ref 80.0–100.0)
Platelets: 351 10*3/uL (ref 150–440)
RBC: 4.01 MIL/uL (ref 3.80–5.20)
RDW: 17.7 % — ABNORMAL HIGH (ref 11.5–14.5)
WBC: 9.6 10*3/uL (ref 3.6–11.0)

## 2018-02-23 LAB — BASIC METABOLIC PANEL
Anion gap: 10 (ref 5–15)
BUN: 13 mg/dL (ref 6–20)
CO2: 24 mmol/L (ref 22–32)
Calcium: 8.5 mg/dL — ABNORMAL LOW (ref 8.9–10.3)
Chloride: 102 mmol/L (ref 98–111)
Creatinine, Ser: 0.72 mg/dL (ref 0.44–1.00)
GFR calc Af Amer: >60 mL/min (ref >60)
GFR calc non Af Amer: >60 mL/min (ref >60)
Glucose, Bld: 121 mg/dL — ABNORMAL HIGH (ref 70–99)
Potassium: 3.7 mmol/L (ref 3.5–5.1)
Sodium: 136 mmol/L (ref 135–145)

## 2018-02-23 LAB — HCG, QUANTITATIVE, PREGNANCY: hCG, Beta Chain, Quant, S: <1 m[IU]/mL (ref <5)

## 2018-02-23 NOTE — ED Provider Notes (Signed)
[facility] Emergency Department Provider Note ____________________________________________   First MD Initiated Contact with Patient 02/23/18 1604     (approximate)  I have reviewed the triage vital signs and the nursing notes.   HISTORY  Chief Complaint Seizures    HPI Michaela Jones is a 42 y.o. female PMH as noted below who presents with concern for possible seizure-like episode, acute onset this afternoon while she was at the bank, and now resolved.  Per her son who was with her, the patient was on the phone and began "convulsing" and dropped the phone, and then lost consciousness.   he states that the shaking lasted about 1 minute and then afterwards she was unresponsive and snoring for about another minute.  Patient had no bowel or bladder incontinence, and did not bite her tongue.  She reports one prior episode similar to this which was associate with anxiety after she did not sleep for several days.  The patient states that she takes a Xanax at night to help with sleep, and that she took it last night.  She also takes oxycodone once daily for back pain and states that she did not take it today.  She denies any drug or alcohol use.  Past Medical History:  Diagnosis Date  . Anemia   . Chronic back pain   . Complication of anesthesia    HARD TO WAKE UP/OXYGEN LEVEL DROPPED AFTER BACK SURGERY X1  . Constipation, chronic   . Depression   . Edema   . History of seasonal allergies    pt has inhalers for this if needed  . Hypotension   . Hypothyroid   . Insomnia   . Lymphedema of both lower extremities   . Migraine   . Restless leg syndrome   . Vitamin D deficiency     Patient Active Problem List   Diagnosis Date Noted  . Lumbar postlaminectomy syndrome 10/23/2017  . Lumbosacral radiculopathy 10/23/2017  . Vitamin D deficiency 03/01/2015  . Acquired spondylolisthesis 03/01/2015  . Elevated LDL cholesterol level 03/01/2015  . Chronic  constipation 03/01/2015  . Back pain, chronic 03/01/2015  . Dysthymia 11/11/2013  . Absolute anemia 10/15/2012  . Bariatric surgery status 10/15/2012  . Gastric bypass status for obesity 10/15/2012  . Insomnia 09/03/2007  . Allergic rhinitis 06/15/2007  . Restless leg 09/23/2006  . Headache, migraine 08/27/2006  . Acquired lymphedema 07/29/1998  . Adult hypothyroidism 07/29/1992    Past Surgical History:  Procedure Laterality Date  . ABLATION  2014  . BACK SURGERY  10/2012  . BREAST ENHANCEMENT SURGERY  07/2008  . BREAST SURGERY  09/05/2011   removal of implants  . CHOLECYSTECTOMY N/A 04/16/2016   Procedure: LAPAROSCOPIC CHOLECYSTECTOMY WITH INTRAOPERATIVE CHOLANGIOGRAM;  Surgeon: Robert Bellow, MD;  Location: [facility] ORS;  Service: General;  Laterality: N/A;  . GASTRIC BYPASS  2002  . Lower body  01/2008, 03/2008   lift legs and thighs  . tummy tuck  2004    Prior to Admission medications   Medication Sig Start Date End Date Taking? Authorizing Provider  albuterol (PROVENTIL HFA;VENTOLIN HFA) 108 (90 Base) MCG/ACT inhaler Inhale 2 puffs into the lungs every 6 (six) hours as needed for wheezing or shortness of breath.    [provider]  albuterol (PROVENTIL) (5 MG/ML) 0.5% nebulizer solution Inhale into the lungs.    [provider]  ALPRAZolam Duanne Moron) 1 MG tablet  05/23/17   [provider]  celecoxib (CELEBREX) 200 MG  capsule Take 200 mg by mouth as needed.  04/10/15   [provider]  cyanocobalamin (,VITAMIN B-12,) 1000 MCG/ML injection INJECT 1 ML ONCE MONTHLY 11/03/16   Birdie Sons, MD  Cyanocobalamin 1000 MCG/ML KIT Inject 1 Dose as directed every 30 (thirty) days.     [provider]  cyclobenzaprine (FLEXERIL) 10 MG tablet Take 1 tablet (10 mg total) by mouth 3 (three) times daily as needed for muscle spasms. 08/30/16   Trinna Post, PA-C  fluticasone (FLONASE) 50 MCG/ACT nasal spray Place 2 sprays into both nostrils  daily. 12/17/17   Trinna Post, PA-C  levothyroxine (SYNTHROID, LEVOTHROID) 112 MCG tablet TAKE 1 TABLET BY MOUTH ONCE DAILY 10/24/17   Birdie Sons, MD  modafinil (PROVIGIL) 100 MG tablet Take 100 mg by mouth daily.    [provider]  naloxegol oxalate (MOVANTIK) 25 MG TABS tablet Take 1 tablet (25 mg total) by mouth daily. 01/07/17   Birdie Sons, MD  naloxone [facility]) nasal spray 4 mg/0.1 mL Place into the nose. 10/23/17 10/23/19  [provider]  oxymorphone (OPANA) 10 MG tablet Take 5-10 mg by mouth 3 (three) times daily.  04/24/16   [provider]  promethazine (PHENERGAN) 6.25 MG/5ML syrup Take 10 mLs (12.5 mg total) by mouth every 6 (six) hours as needed for nausea or vomiting. 10/29/17   Trinna Post, PA-C  promethazine-dextromethorphan (PROMETHAZINE-DM) 6.25-15 MG/5ML syrup Take 5 mLs by mouth at bedtime as needed for cough. 12/17/17   Trinna Post, PA-C  sertraline (ZOLOFT) 100 MG tablet Take 100 mg by mouth at bedtime.  06/02/15   [provider]  zolmitriptan (ZOMIG-ZMT) 2.5 MG disintegrating tablet Take first dose at onset of headache. May take second dose two hours later. 10/29/17   Trinna Post, PA-C    Allergies Lyrica [pregabalin]; Gabapentin; Nsaids; and Penicillins  Family History  Problem Relation Age of Onset  . Pulmonary embolism Mother   . Depression Mother   . Asthma Mother   . Heart attack Father   . Cancer Father     Social History Social History   Tobacco Use  . Smoking status: Never Smoker  . Smokeless tobacco: Never Used  Substance Use Topics  . Alcohol use: Yes    Alcohol/week: 0.0 oz    Comment: SOCIALLY  . Drug use: No    Review of Systems  Constitutional: No fever. Eyes: No visual changes. ENT: No neck pain. Cardiovascular: Denies chest pain. Respiratory: Denies shortness of breath. Gastrointestinal: No vomiting. Genitourinary: Negative for dysuria.  Musculoskeletal: Negative for back  pain. Skin: Negative for rash. Neurological: Negative for headache.   ____________________________________________   PHYSICAL EXAM:  VITAL SIGNS: ED Triage Vitals [02/23/18 1558]  Enc Vitals Group     BP (!) 140/101     Pulse Rate (!) 105     Resp 20     Temp 98.3 F (36.8 C)     Temp Source Oral     SpO2 98 %     Weight 240 lb (108.9 kg)     Height 5' 6"  (1.676 m)     Head Circumference      Peak Flow      Pain Score 0     Pain Loc      Pain Edu?      Excl. in [place]?     Constitutional: Alert and oriented.  Relatively well appearing and in no acute distress. Eyes: Conjunctivae are normal.  EOMI.  PERRLA. Head: Atraumatic. Nose: No congestion/rhinnorhea. Mouth/Throat: Mucous membranes are moist.   Neck: Normal range of motion.  Cardiovascular: Normal rate, regular rhythm. Grossly normal heart sounds.  Good peripheral circulation. Respiratory: Normal respiratory effort.  No retractions. Lungs CTAB. Gastrointestinal: Soft and nontender. No distention.  Genitourinary: No flank tenderness. Musculoskeletal: Extremities warm and well perfused.  Neurologic:  Normal speech and language.  Motor and sensory intact in all extremities.  Normal coordination.  No gross focal neurologic deficits are appreciated.  Skin:  Skin is warm and dry. No rash noted. Psychiatric: Anxious appearing, and intermittently tearful.  Speech and behavior are normal.  ____________________________________________   LABS (all labs ordered are listed, but only abnormal results are displayed)  Labs Reviewed  CBC - Abnormal; Notable for the following components:      Result Value   RDW 17.7 (*)    All other components within normal limits  BASIC METABOLIC PANEL - Abnormal; Notable for the following components:   Glucose, Bld 121 (*)    Calcium 8.5 (*)    All other components within normal limits  HCG, QUANTITATIVE, PREGNANCY   ____________________________________________  EKG  ED ECG REPORT I,  Arta Silence, the attending physician, personally viewed and interpreted this ECG.  Date: 02/23/2018 EKG Time: 1555 Rate: 114 Rhythm: Sinus tachycardia QRS Axis: normal Intervals: normal ST/T Wave abnormalities: normal Narrative Interpretation: no evidence of acute ischemia  ____________________________________________  RADIOLOGY  CT head: No acute findings  ____________________________________________   PROCEDURES  Procedure(s) performed: No  Procedures  Critical Care performed: No ____________________________________________   INITIAL IMPRESSION / ASSESSMENT AND PLAN / ED COURSE  Pertinent labs & imaging results that were available during my care of the patient were reviewed by me and considered in my medical decision making (see chart for details).  42 year old female with PMH as noted above presents with an apparent seizure-like episode while at the bank today.  She endorses a great deal of stress this week because the family dog just passed away, and was dealing with some financial issues at the bank.  Per her son, who witnessed the event, she began to have convulsions and lost consciousness.  She did have an apparent postictal state but no bowel or bladder incontinence or tongue biting.  She is at her baseline currently.  She reports one similar prior episode that occurred after she did not sleep for 2 nights due to stress.  ----------------------------------------- 6:13 PM on 02/23/2018 -----------------------------------------  The patient's lab work-up and CT are negative.  She has remained asymptomatic in the ED.  Her vital signs are stable.  She is comfortable to go home at this time.  Overall I suspect most likely nonepileptic episode such as pseudoseizure or vasovagal syncope.  Given that the event was precipitated by acute stress/anxiety, I do not feel that there is any indication to restrict the patient's driving.  The patient agrees to follow-up with  her primary care doctor.,  And she expresses understanding.  I counseled the patient on return precautions   ____________________________________________   FINAL CLINICAL IMPRESSION(S) / ED DIAGNOSES  Final diagnoses:  Seizure-like activity ([place])      NEW MEDICATIONS STARTED DURING THIS VISIT:  New Prescriptions   No medications on file     Note:  This document was prepared using Dragon voice recognition software and may include unintentional dictation errors.    Arta Silence, MD 02/23/18 [phone]

## 2018-02-23 NOTE — ED Triage Notes (Signed)
While at the bank, pt son reports the pt was unresponsive and shaking while at the bank. Pt crying in triage and states "nothing is wrong with me, I promise I was at the store and now I am here." Pt denies recreational drug use, reports taking 1 pain pill today. Pt son at side. Pt A & Ox4 and denies any pain or complaints.

## 2018-02-23 NOTE — ED Notes (Signed)
Pt husband at bedside at this time. Pt has calmed down and denies remembering what happened. Pt denies any HA or N/V/D at this time.

## 2018-02-23 NOTE — Discharge Instructions (Addendum)
Return to the ER for new, worsening, recurrent seizure-like episodes, change in mental status, convulsions, severe headache, strokelike symptoms, or any other new or worsening symptoms that concern you.  Follow-up with your primary care doctor within 1 to 2 weeks.

## 2018-03-25 ENCOUNTER — Other Ambulatory Visit: Payer: Self-pay | Admitting: Family Medicine

## 2018-04-10 DIAGNOSIS — F3340 Major depressive disorder, recurrent, in remission, unspecified: Secondary | ICD-10-CM | POA: Diagnosis not present

## 2018-04-23 DIAGNOSIS — Z79891 Long term (current) use of opiate analgesic: Secondary | ICD-10-CM | POA: Diagnosis not present

## 2018-04-23 DIAGNOSIS — M961 Postlaminectomy syndrome, not elsewhere classified: Secondary | ICD-10-CM | POA: Diagnosis not present

## 2018-04-23 DIAGNOSIS — M5417 Radiculopathy, lumbosacral region: Secondary | ICD-10-CM | POA: Diagnosis not present

## 2018-04-23 DIAGNOSIS — G894 Chronic pain syndrome: Secondary | ICD-10-CM | POA: Diagnosis not present

## 2018-04-23 DIAGNOSIS — Z5181 Encounter for therapeutic drug level monitoring: Secondary | ICD-10-CM | POA: Diagnosis not present

## 2018-06-17 ENCOUNTER — Encounter: Payer: Self-pay | Admitting: Emergency Medicine

## 2018-06-17 DIAGNOSIS — F419 Anxiety disorder, unspecified: Secondary | ICD-10-CM | POA: Insufficient documentation

## 2018-07-03 ENCOUNTER — Ambulatory Visit: Payer: 59 | Admitting: Psychiatry

## 2018-07-03 ENCOUNTER — Encounter: Payer: Self-pay | Admitting: Psychiatry

## 2018-07-03 VITALS — BP 122/80 | HR 90

## 2018-07-03 DIAGNOSIS — F411 Generalized anxiety disorder: Secondary | ICD-10-CM | POA: Diagnosis not present

## 2018-07-03 DIAGNOSIS — F4323 Adjustment disorder with mixed anxiety and depressed mood: Secondary | ICD-10-CM

## 2018-07-03 DIAGNOSIS — F5101 Primary insomnia: Secondary | ICD-10-CM

## 2018-07-03 MED ORDER — DULOXETINE HCL 30 MG PO CPEP: ORAL_CAPSULE | ORAL | 0 refills | Status: DC

## 2018-07-03 MED ORDER — DULOXETINE HCL 60 MG PO CPEP: 60.0000 mg | ORAL_CAPSULE | Freq: Every day | ORAL | 0 refills | Status: DC

## 2018-07-03 NOTE — Progress Notes (Signed)
Michaela Jones [id number] 08-Mar-1976 42 y.o.  Subjective:   Patient ID:  Michaela Jones is a 42 y.o. (DOB 08-Mar-1976) female.  Chief Complaint:  Chief Complaint  Patient presents with  . Anxiety  . Depression  . Insomnia    HPI Michaela Jones presents to the office today for follow-up of anxiety, insomnia, and h/o decreased concentration. [place] and reports that this did not go well. "My master's degree is on indefinite hold" since neuropathy and pain in her left arm interferes with typing and writing. Reports that her mood has been more depressed and attributes this to frequent pain. Reports some possible anxiety and reports at time of exam she feels like there is a weight on her chest and her palms are sweaty. Reports some worry and rumination. Noticed some increased anxiety when neighbor/friend has asked for her help with his business and being afraid that she would let people down. Reports that Belsomra was not effective for insomnia. Reports that she is sleeping about 3-4 hours a night and then awakens and is unable to return to sleep. "When I was sleeping I felt a lot better." Energy and motivation have been decreased. Notices she does not feel like doing her usual tasks and has to force herself to do things. Reports losing interest in things. "Nothing makes me happy." Appetite has been decreased. Reports that Nuvigil is helpful for her concentration and focus. Denies SI.   Has been seeing a therapist through [facility].   Review of Systems:  Review of Systems  Musculoskeletal: Positive for arthralgias. Negative for gait problem.  Neurological: Positive for numbness. Negative for tremors.  Psychiatric/Behavioral:       Please refer to HPI    Medications: I have reviewed the patient's current medications.  Current Outpatient Medications  Medication Sig Dispense Refill  . albuterol (PROVENTIL HFA;VENTOLIN HFA) 108 (90 Base) MCG/ACT inhaler Inhale 2 puffs into  the lungs every 6 (six) hours as needed for wheezing or shortness of breath.    . ALPRAZolam (XANAX) 1 MG tablet 2 mg at bedtime.   0  . Armodafinil 150 MG tablet Take 150 mg by mouth every morning.    . celecoxib (CELEBREX) 200 MG capsule Take 200 mg by mouth as needed.     . cetirizine (ZYRTEC) 10 MG tablet Take 10 mg by mouth daily as needed for allergies.    . Cholecalciferol (VITAMIN D) 50 MCG (2000 UT) CAPS Take by mouth.    . cyanocobalamin (,VITAMIN B-12,) 1000 MCG/ML injection INJECT 1 MILLILITER (ML) ONCE MONTHLY 6 mL 4  . fluticasone (FLONASE) 50 MCG/ACT nasal spray Place 2 sprays into both nostrils daily. 16 g 6  . levothyroxine (SYNTHROID, LEVOTHROID) 112 MCG tablet TAKE 1 TABLET BY MOUTH ONCE DAILY 90 tablet 0  . Multiple Vitamin (MULTIVITAMIN) tablet Take 1 tablet by mouth daily.    Marland Kitchen. oxymorphone (OPANA) 10 MG tablet Take 5-10 mg by mouth 4 (four) times daily as needed.     Marland Kitchen. albuterol (PROVENTIL) (5 MG/ML) 0.5% nebulizer solution Inhale into the lungs.    . DULoxetine (CYMBALTA) 30 MG capsule Take 1 capsule q am x 1 week, then increase to 2 capsules po q am 30 capsule 0  . DULoxetine (CYMBALTA) 60 MG capsule Take 1 capsule (60 mg total) by mouth daily. 30 capsule 0  . naloxone (NARCAN) nasal spray 4 mg/0.1 mL Place into the nose.    . promethazine (PHENERGAN) 6.25 MG/5ML syrup  Take 10 mLs (12.5 mg total) by mouth every 6 (six) hours as needed for nausea or vomiting. (Patient not taking: Reported on 07/03/2018) 120 mL 0  . promethazine-dextromethorphan (PROMETHAZINE-DM) 6.25-15 MG/5ML syrup Take 5 mLs by mouth at bedtime as needed for cough. (Patient not taking: Reported on 07/03/2018) 118 mL 0   No current facility-administered medications for this visit.     Medication Side Effects: None  Allergies:  Allergies  Allergen Reactions  . Lyrica [Pregabalin] Other (See Comments)    Patient reports neurological problem. Speech disturbances and unable to control motor skills  .  Gabapentin Swelling  . Nsaids Other (See Comments)    Avoids due to gastric bypass  . Penicillins Rash    Past Medical History:  Diagnosis Date  . Anemia   . Chronic back pain   . Complication of anesthesia    HARD TO WAKE UP/OXYGEN LEVEL DROPPED AFTER BACK SURGERY X1  . Constipation, chronic   . Depression   . Edema   . History of seasonal allergies    pt has inhalers for this if needed  . Hypotension   . Hypothyroid   . Insomnia   . Lymphedema of both lower extremities   . Migraine   . Restless leg syndrome   . Vitamin D deficiency     Family History  Problem Relation Age of Onset  . Pulmonary embolism Mother   . Depression Mother   . Asthma Mother   . Heart attack Father   . Cancer Father     Social History   Socioeconomic History  . Marital status: Married    Spouse name: Not on file  . Number of children: Not on file  . Years of education: Not on file  . Highest education level: Not on file  Occupational History  . Not on file  Social Needs  . Financial resource strain: Not on file  . Food insecurity:    Worry: Not on file    Inability: Not on file  . Transportation needs:    Medical: Not on file    Non-medical: Not on file  Tobacco Use  . Smoking status: Never Smoker  . Smokeless tobacco: Never Used  Substance and Sexual Activity  . Alcohol use: Yes    Alcohol/week: 0.0 standard drinks    Comment: Rare  . Drug use: No  . Sexual activity: Not on file  Lifestyle  . Physical activity:    Days per week: Not on file    Minutes per session: Not on file  . Stress: Not on file  Relationships  . Social connections:    Talks on phone: Not on file    Gets together: Not on file    Attends religious service: Not on file    Active member of club or organization: Not on file    Attends meetings of clubs or organizations: Not on file    Relationship status: Not on file  . Intimate partner violence:    Fear of current or ex partner: Not on file     Emotionally abused: Not on file    Physically abused: Not on file    Forced sexual activity: Not on file  Other Topics Concern  . Not on file  Social History Narrative  . Not on file    Past Medical History, Surgical history, Social history, and Family history were reviewed and updated as appropriate.   Please see review of systems for further details on the patient's  review from today.   Objective:   Physical Exam:  BP 122/80   Pulse 90   Physical Exam  Constitutional: She is oriented to person, place, and time. She appears well-developed. No distress.  Musculoskeletal: She exhibits no deformity.  Neurological: She is alert and oriented to person, place, and time. Coordination normal.  Psychiatric: Her speech is normal and behavior is normal. Judgment and thought content normal. Her mood appears anxious. Her affect is not angry, not blunt, not labile and not inappropriate. Cognition and memory are normal. She exhibits a depressed mood. She expresses no homicidal and no suicidal ideation. She expresses no suicidal plans and no homicidal plans.  Insight intact. No auditory or visual hallucinations. No delusions.     Lab Review:     Component Value Date/Time   NA 136 02/23/2018 1645   NA 137 03/27/2016 1217   NA 139 12/03/2012 1136   K 3.7 02/23/2018 1645   K 4.0 12/03/2012 1136   CL 102 02/23/2018 1645   CL 105 12/03/2012 1136   CO2 24 02/23/2018 1645   CO2 26 12/03/2012 1136   GLUCOSE 121 (H) 02/23/2018 1645   GLUCOSE 77 12/03/2012 1136   BUN 13 02/23/2018 1645   BUN 8 03/27/2016 1217   BUN 16 12/03/2012 1136   CREATININE 0.72 02/23/2018 1645   CREATININE 0.46 (L) 12/03/2012 1136   CALCIUM 8.5 (L) 02/23/2018 1645   CALCIUM 9.0 12/03/2012 1136   PROT 6.3 03/27/2016 1217   ALBUMIN 4.0 03/27/2016 1217   AST 20 03/27/2016 1217   ALT 18 03/27/2016 1217   ALKPHOS 74 03/27/2016 1217   BILITOT 0.3 03/27/2016 1217   GFRNONAA >60 02/23/2018 1645   GFRNONAA >60 12/03/2012  1136   GFRAA >60 02/23/2018 1645   GFRAA >60 12/03/2012 1136       Component Value Date/Time   WBC 9.6 02/23/2018 1612   RBC 4.01 02/23/2018 1612   HGB 12.0 02/23/2018 1612   HGB 10.9 (L) 03/27/2016 1217   HCT 36.2 02/23/2018 1612   HCT 33.2 (L) 03/27/2016 1217   PLT 351 02/23/2018 1612   PLT 275 03/27/2016 1217   MCV 90.3 02/23/2018 1612   MCV 92 03/27/2016 1217   MCV 101 (H) 06/15/2013 1042   MCH 29.9 02/23/2018 1612   MCHC 33.1 02/23/2018 1612   RDW 17.7 (H) 02/23/2018 1612   RDW 14.3 03/27/2016 1217   RDW 14.5 06/15/2013 1042   LYMPHSABS 1.1 03/27/2016 1217   LYMPHSABS 3.5 06/15/2013 1042   MONOABS 0.9 06/15/2013 1042   EOSABS 0.1 03/27/2016 1217   EOSABS 0.1 06/15/2013 1042   BASOSABS 0.0 03/27/2016 1217   BASOSABS 0.1 06/15/2013 1042    No results found for: POCLITH, LITHIUM   No results found for: PHENYTOIN, PHENOBARB, VALPROATE, CBMZ   .res Assessment: Plan:   Patient seen for 45 minutes and greater than 50% of visit spent patient counseling patient and coordination of care to include reviewing past psychiatric treating providers notes and reviewing response to meds with patient during visit.  Discussed potential benefits, risks, and side effects of Cymbalta since this may be helpful for neuropathy which is currently having a negative impact on her sleep, mood, and anxiety.  Discussed that Cymbalta is also indicated for depression and anxiety and may be helpful for these signs and symptoms.  Patient reports brief past trial of Cymbalta and reports that she took Cymbalta after back surgery and during a time when there were multiple stressors, and therefore  past trial may not have been indicative of how she would respond to Cymbalta at this time. Will start Cymbalta 30 mg p.o. every morning x1 week, then increase to Cymbalta 60 mg in the morning for mood, and anxiety. Continue alprazolam for insomnia and anxiety. Continue Nuvigil 150 mg in the morning for  concentration. Generalized anxiety disorder - Plan: DULoxetine (CYMBALTA) 30 MG capsule, DULoxetine (CYMBALTA) 60 MG capsule  Primary insomnia  Please see After Visit Summary for patient specific instructions.  Future Appointments  Date Time Provider Department Center  08/14/2018  4:00 PM Corie Chiquito, PMHNP [facility] None    No orders of the defined types were placed in this encounter.     -------------------------------

## 2018-07-16 DIAGNOSIS — M5417 Radiculopathy, lumbosacral region: Secondary | ICD-10-CM | POA: Diagnosis not present

## 2018-07-16 DIAGNOSIS — Z79891 Long term (current) use of opiate analgesic: Secondary | ICD-10-CM | POA: Diagnosis not present

## 2018-07-16 DIAGNOSIS — M961 Postlaminectomy syndrome, not elsewhere classified: Secondary | ICD-10-CM | POA: Diagnosis not present

## 2018-07-16 DIAGNOSIS — Z5181 Encounter for therapeutic drug level monitoring: Secondary | ICD-10-CM | POA: Diagnosis not present

## 2018-07-16 DIAGNOSIS — R202 Paresthesia of skin: Secondary | ICD-10-CM | POA: Diagnosis not present

## 2018-07-16 DIAGNOSIS — R200 Anesthesia of skin: Secondary | ICD-10-CM | POA: Diagnosis not present

## 2018-07-28 IMAGING — XA DG CHOLANGIOGRAM OPERATIVE
3 series · 11 of 11 positions shown · non-contrast
Comparison: Ultrasound 03/21/2016

CLINICAL DATA: Surgery, elected.  Cholecystectomy.

EXAM:
INTRAOPERATIVE CHOLANGIOGRAM
TECHNIQUE: Cholangiographic images from the C-arm fluoroscopic device were
submitted for interpretation post-operatively. Please see the
procedural report for the amount of contrast and the fluoroscopy
time utilized.

[Series 1: cont. · 4 of 7 frames shown (1 of 3)]
[frame 2/7]
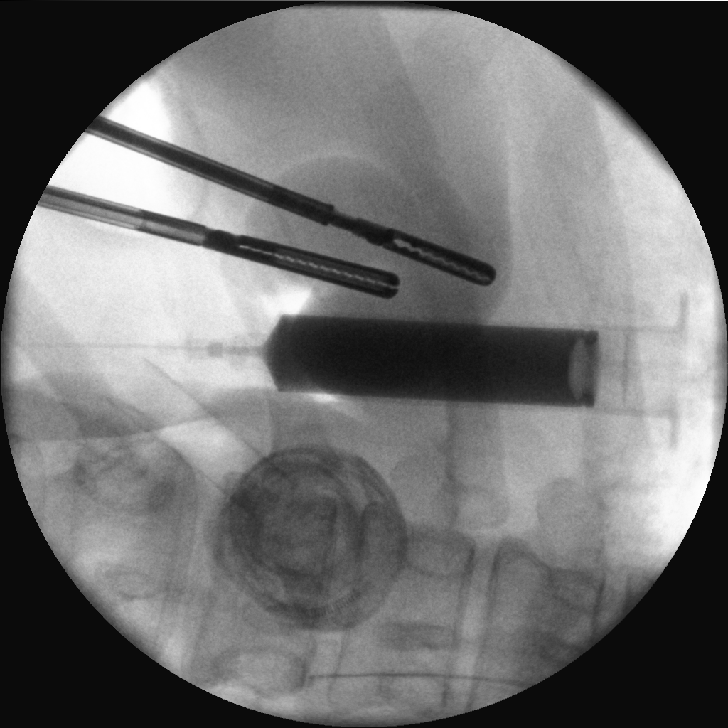
[frame 4/7]
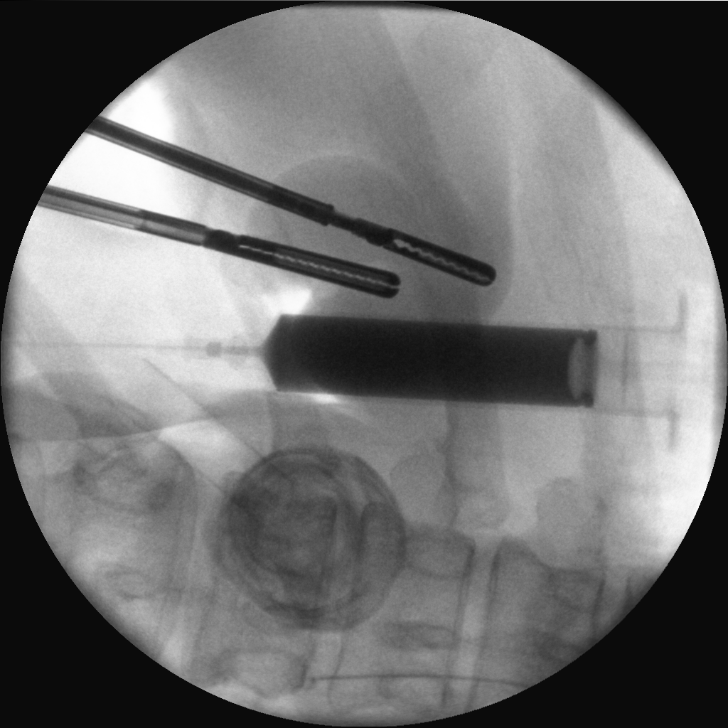
[frame 6/7]
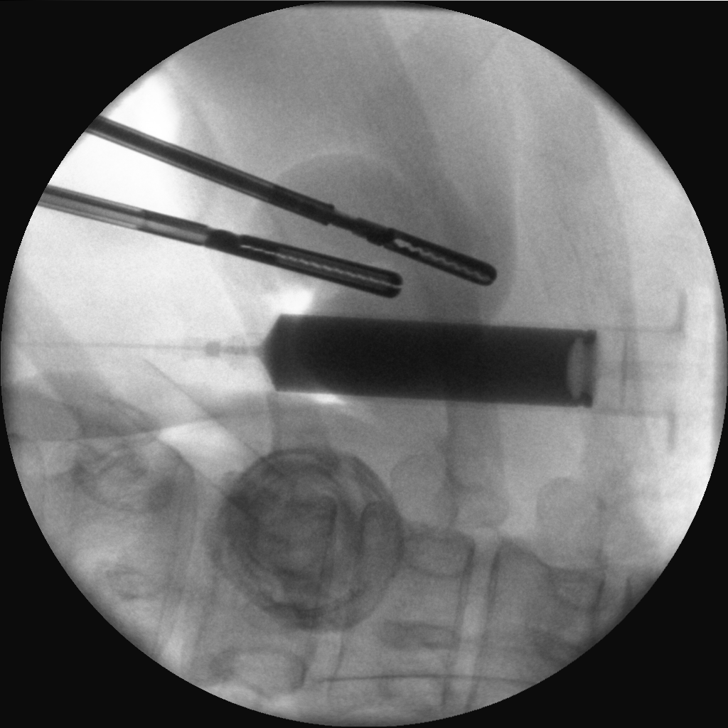
[frame 7/7]
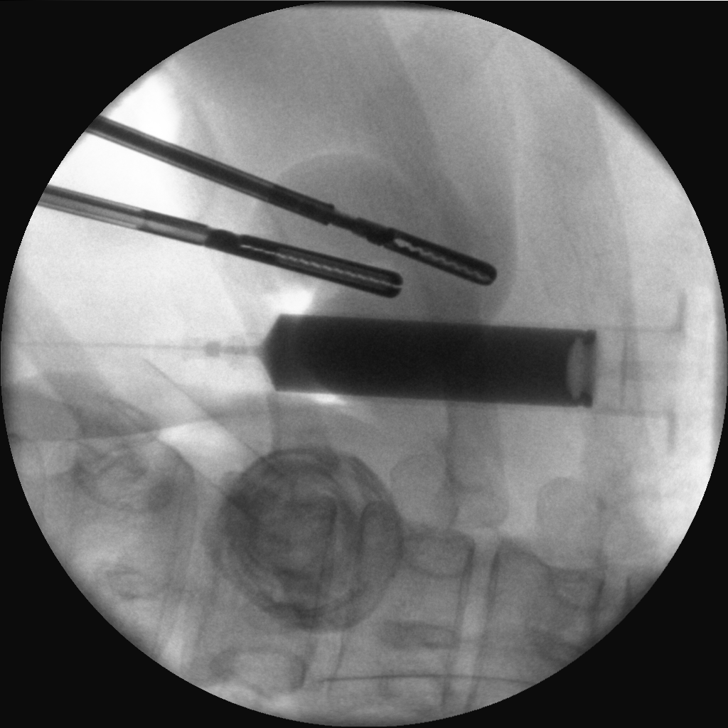

[Series 2: cont. · 3 of 3 frames shown (2 of 3)]
[frame 1/3]
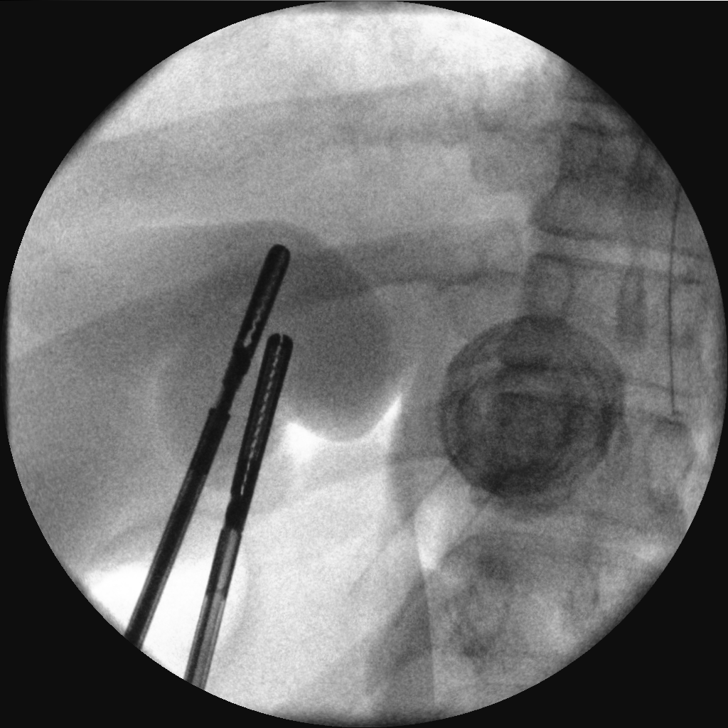
[frame 2/3]
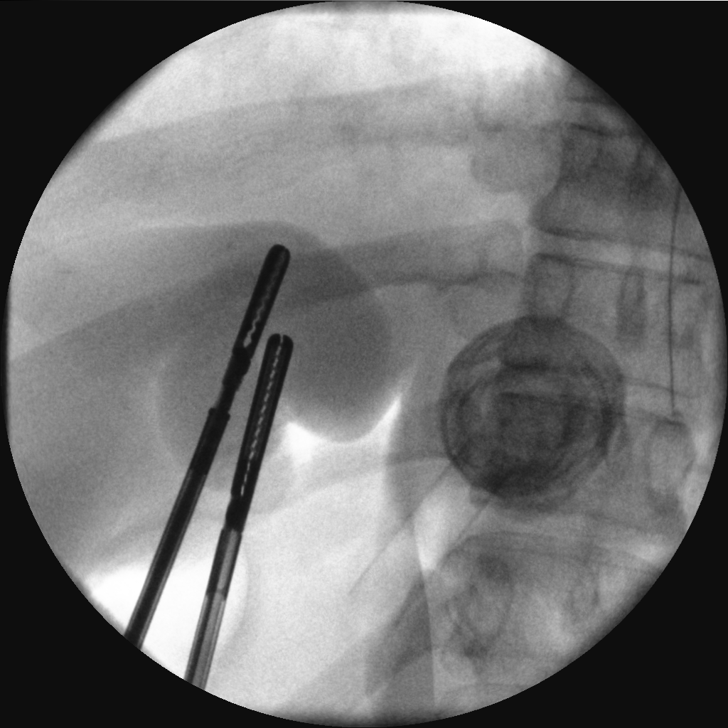
[frame 3/3]
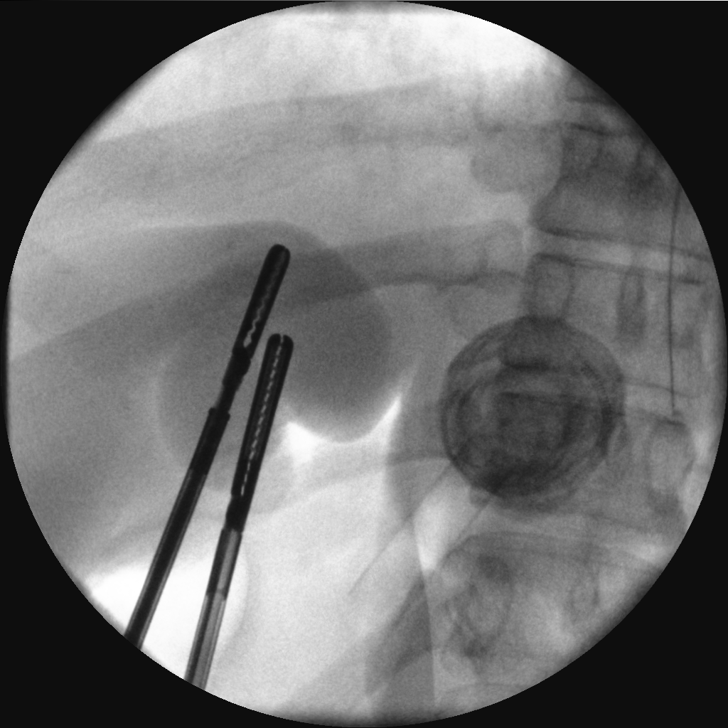

[Series 3: cont. · 4 of 34 frames shown (3 of 3)]
[frame 4/34]
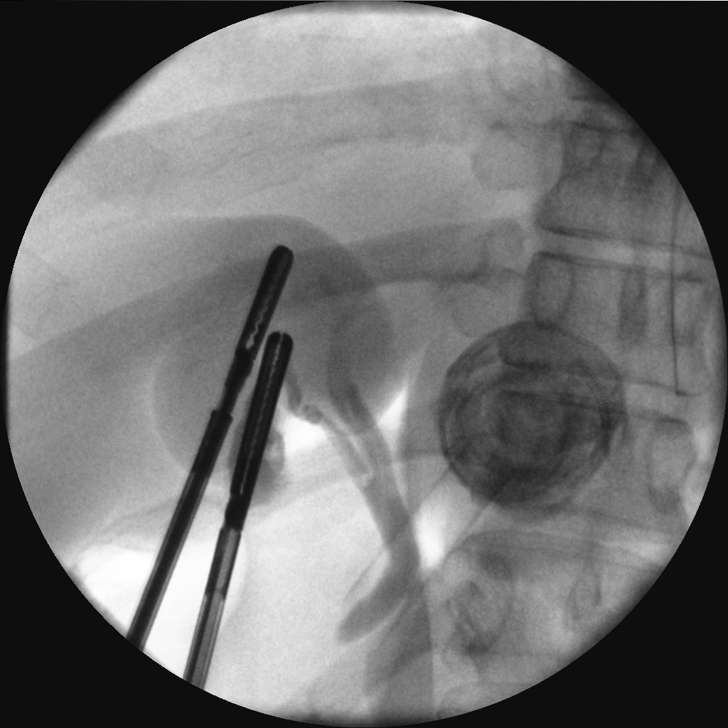
[frame 6/34]
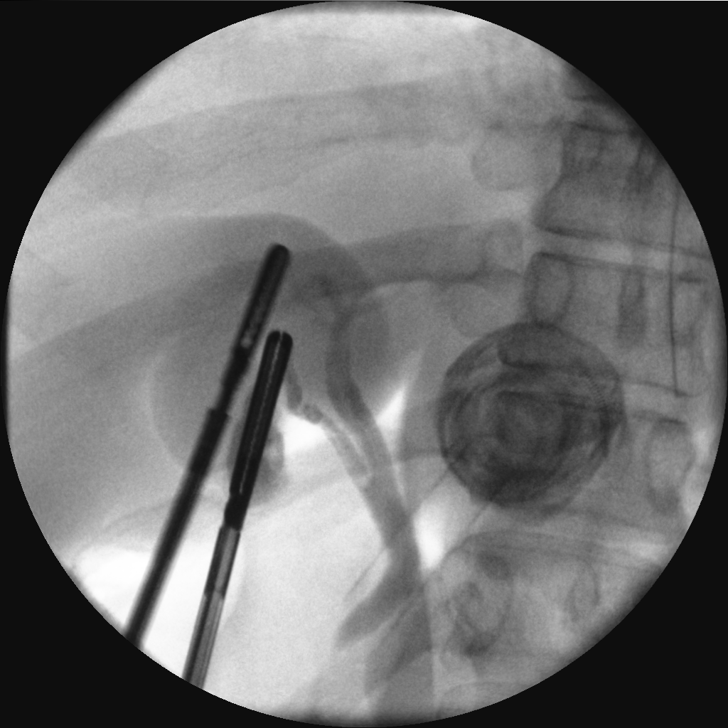
[frame 18/34]
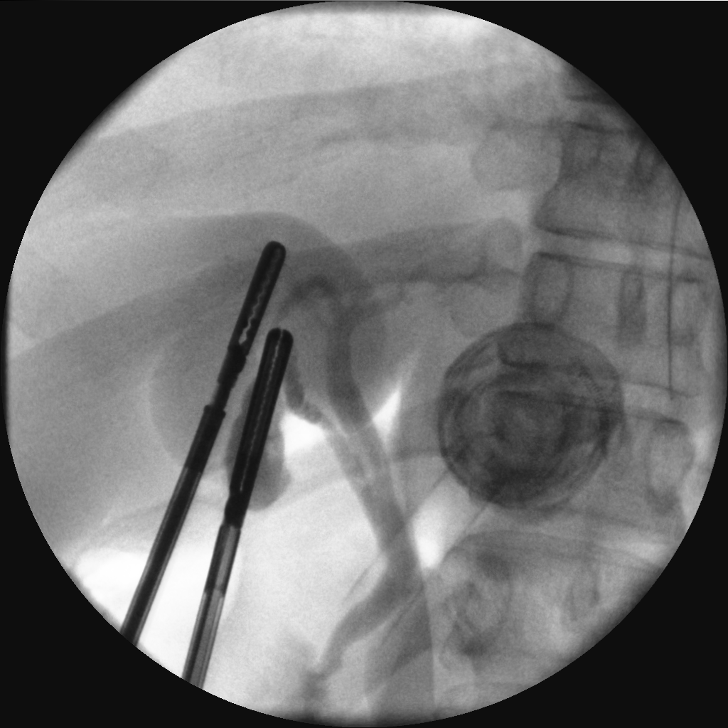
[frame 29/34]
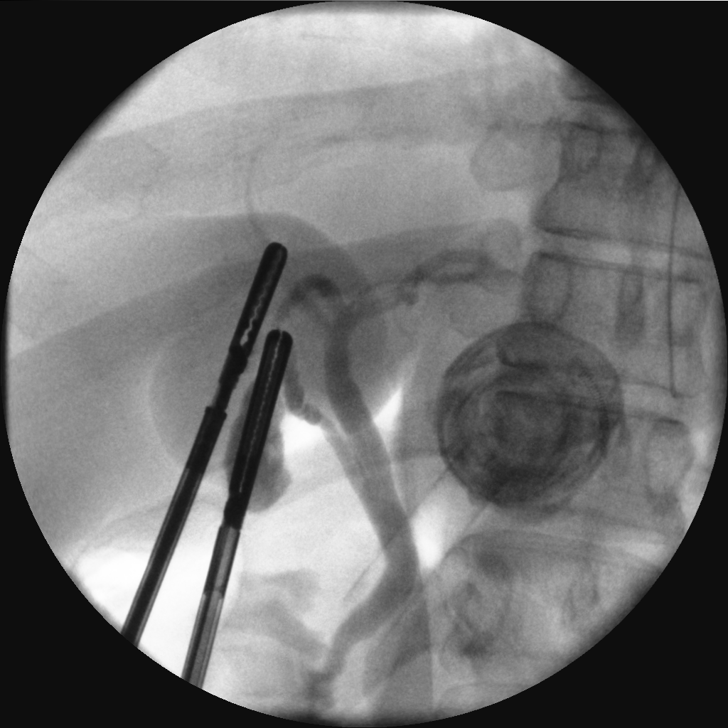

[11 of 11 positions shown; findings below may reference images not displayed]

FINDINGS: Contrast opacification of the gallbladder, cystic duct and biliary
system. Contrast drains into the gallbladder. No large obstructing
filling defects.
IMPRESSION: Patent biliary system.

## 2018-08-14 ENCOUNTER — Ambulatory Visit: Payer: 59 | Admitting: Psychiatry

## 2018-09-14 ENCOUNTER — Other Ambulatory Visit: Payer: Self-pay

## 2018-09-14 MED ORDER — ALPRAZOLAM 1 MG PO TABS: 2.0000 mg | ORAL_TABLET | Freq: Every day | ORAL | 0 refills | Status: DC

## 2018-09-14 NOTE — Progress Notes (Signed)
Refill request from [facility] for Alprazolam 1mg . Will submit a 30 day but pt needs to reschedule office visit due in January for follow up.

## 2018-09-16 ENCOUNTER — Other Ambulatory Visit: Payer: Self-pay | Admitting: Psychiatry

## 2018-09-16 DIAGNOSIS — F411 Generalized anxiety disorder: Secondary | ICD-10-CM

## 2018-09-21 ENCOUNTER — Other Ambulatory Visit: Payer: Self-pay | Admitting: *Deleted

## 2018-09-21 DIAGNOSIS — R200 Anesthesia of skin: Secondary | ICD-10-CM

## 2018-09-22 ENCOUNTER — Ambulatory Visit (INDEPENDENT_AMBULATORY_CARE_PROVIDER_SITE_OTHER): Payer: 59 | Admitting: Neurology

## 2018-09-22 DIAGNOSIS — R200 Anesthesia of skin: Secondary | ICD-10-CM | POA: Diagnosis not present

## 2018-09-22 NOTE — Procedures (Signed)
[facility]  [address] [place], [address]  [place], [place] [address] Tel: [phone] Fax:  [phone] Test Date:  09/22/2018  Patient: Michaela Jones DOB: 1976-06-09 Physician: Nita Sickle, DO  Sex: Female Height: 5\' 6"  Ref Phys: Concha Se, [place]  ID#: [id number] Temp: 36.0C Technician:    Patient Complaints: This is a 43 year old female referred for evaluation of left arm numbness and tingling.  NCV & EMG Findings: Extensive electrodiagnostic testing of the left upper extremity shows:  1. Left median, ulnar, and mixed palmar sensory responses are within normal limits.   2. Left median and ulnar motor responses are within normal limits.   3. There is no evidence of active or chronic motor axonal loss changes affecting any of the tested muscles.  Motor unit configuration and recruitment pattern is within normal limits.  Impression: This is a normal study of the left upper extremity.  In particular, there is no evidence of a cervical radiculopathy or carpal tunnel syndrome.   ___________________________ Nita Sickle, DO    Nerve Conduction Studies Anti Sensory Summary Table   Site NR Peak (ms) Norm Peak (ms) P-T Amp (V) Norm P-T Amp  Left Median Anti Sensory (2nd Digit)  36C  Wrist    3.0 <3.4 68.9 >20  Left Ulnar Anti Sensory (5th Digit)  36C  Wrist    2.6 <3.1 53.2 >12   Motor Summary Table   Site NR Onset (ms) Norm Onset (ms) O-P Amp (mV) Norm O-P Amp Site1 Site2 Delta-0 (ms) Dist (cm) Vel (m/s) Norm Vel (m/s)  Left Median Motor (Abd Poll Brev)  36C  Wrist    2.7 <3.9 9.4 >6 Elbow Wrist 4.3 27.0 63 >50  Elbow    7.0  9.3         Left Ulnar Motor (Abd Dig Minimi)  36C  Wrist    1.7 <3.1 9.5 >7 B Elbow Wrist 3.4 23.0 68 >50  B Elbow    5.1  9.2  A Elbow B Elbow 1.5 10.0 67 >50  A Elbow    6.6  9.1          Comparison Summary Table   Site NR Peak (ms) Norm Peak (ms) P-T Amp (V) Site1 Site2 Delta-P (ms) Norm Delta (ms)  Left Median/Ulnar Palm Comparison  (Wrist - 8cm)  36C  Median Palm    1.8 <2.2 66.2 Median Palm Ulnar Palm 0.1   Ulnar Palm    1.7 <2.2 14.7       EMG   Side Muscle Ins Act Fibs Psw Fasc Number Recrt Dur Dur. Amp Amp. Poly Poly. Comment  Left 1stDorInt Nml Nml Nml Nml Nml Nml Nml Nml Nml Nml Nml Nml N/A  Left PronatorTeres Nml Nml Nml Nml Nml Nml Nml Nml Nml Nml Nml Nml N/A  Left Biceps Nml Nml Nml Nml Nml Nml Nml Nml Nml Nml Nml Nml N/A  Left Triceps Nml Nml Nml Nml Nml Nml Nml Nml Nml Nml Nml Nml N/A  Left Deltoid Nml Nml Nml Nml Nml Nml Nml Nml Nml Nml Nml Nml N/A  Left Cervical Parasp Low Nml Nml Nml Nml NE - - - - - - - N/A      Waveforms:

## 2018-09-23 ENCOUNTER — Other Ambulatory Visit: Payer: Self-pay | Admitting: Family Medicine

## 2018-09-23 DIAGNOSIS — H5213 Myopia, bilateral: Secondary | ICD-10-CM | POA: Diagnosis not present

## 2018-10-06 DIAGNOSIS — Z79891 Long term (current) use of opiate analgesic: Secondary | ICD-10-CM | POA: Diagnosis not present

## 2018-10-06 DIAGNOSIS — M961 Postlaminectomy syndrome, not elsewhere classified: Secondary | ICD-10-CM | POA: Diagnosis not present

## 2018-10-06 DIAGNOSIS — Z5181 Encounter for therapeutic drug level monitoring: Secondary | ICD-10-CM | POA: Diagnosis not present

## 2018-10-06 DIAGNOSIS — M5417 Radiculopathy, lumbosacral region: Secondary | ICD-10-CM | POA: Diagnosis not present

## 2018-10-26 ENCOUNTER — Telehealth: Payer: Self-pay | Admitting: Psychiatry

## 2018-10-26 ENCOUNTER — Other Ambulatory Visit: Payer: Self-pay

## 2018-10-26 DIAGNOSIS — F411 Generalized anxiety disorder: Secondary | ICD-10-CM

## 2018-10-26 MED ORDER — DULOXETINE HCL 60 MG PO CPEP: 60.0000 mg | ORAL_CAPSULE | Freq: Every day | ORAL | 0 refills | Status: DC

## 2018-10-26 MED ORDER — ALPRAZOLAM 1 MG PO TABS: 2.0000 mg | ORAL_TABLET | Freq: Every day | ORAL | 0 refills | Status: DC

## 2018-10-26 NOTE — Telephone Encounter (Signed)
Refills submitted to pharmacy for 1 month

## 2018-10-26 NOTE — Telephone Encounter (Signed)
Pt needs refill on cymbalta ,xanax. [facility] [phone].

## 2018-10-26 NOTE — Telephone Encounter (Signed)
Pt over due for appt, was due in Jan 2020 with Shanda Bumps.  Needs to schedule office visit.

## 2018-10-27 ENCOUNTER — Ambulatory Visit: Payer: Self-pay | Admitting: Physician Assistant

## 2018-10-27 NOTE — Progress Notes (Deleted)
       Patient: Michaela Jones Female    DOB: 09-01-1975   43 y.o.   MRN: [id number] Visit Date: 10/27/2018  Today's Provider: Trey Sailors, PA-C   No chief complaint on file.  Subjective:     Arm Pain        Allergies  Allergen Reactions  . Lyrica [Pregabalin] Other (See Comments)    Patient reports neurological problem. Speech disturbances and unable to control motor skills  . Gabapentin Swelling  . Nsaids Other (See Comments)    Avoids due to gastric bypass  . Penicillins Rash     Current Outpatient Medications:  .  albuterol (PROVENTIL HFA;VENTOLIN HFA) 108 (90 Base) MCG/ACT inhaler, Inhale 2 puffs into the lungs every 6 (six) hours as needed for wheezing or shortness of breath., Disp: , Rfl:  .  albuterol (PROVENTIL) (5 MG/ML) 0.5% nebulizer solution, Inhale into the lungs., Disp: , Rfl:  .  ALPRAZolam (XANAX) 1 MG tablet, Take 2 tablets (2 mg total) by mouth at bedtime., Disp: 60 tablet, Rfl: 0 .  Armodafinil 150 MG tablet, Take 150 mg by mouth every morning., Disp: , Rfl:  .  celecoxib (CELEBREX) 200 MG capsule, Take 200 mg by mouth as needed. , Disp: , Rfl:  .  cetirizine (ZYRTEC) 10 MG tablet, Take 10 mg by mouth daily as needed for allergies., Disp: , Rfl:  .  Cholecalciferol (VITAMIN D) 50 MCG (2000 UT) CAPS, Take by mouth., Disp: , Rfl:  .  cyanocobalamin (,VITAMIN B-12,) 1000 MCG/ML injection, INJECT 1 MILLILITER (ML) ONCE MONTHLY, Disp: 6 mL, Rfl: 4 .  DULoxetine (CYMBALTA) 30 MG capsule, Take 1 capsule q am x 1 week, then increase to 2 capsules po q am, Disp: 30 capsule, Rfl: 0 .  DULoxetine (CYMBALTA) 60 MG capsule, Take 1 capsule (60 mg total) by mouth daily for 30 days., Disp: 30 capsule, Rfl: 0 .  fluticasone (FLONASE) 50 MCG/ACT nasal spray, Place 2 sprays into both nostrils daily., Disp: 16 g, Rfl: 6 .  levothyroxine (SYNTHROID, LEVOTHROID) 112 MCG tablet, TAKE 1 TABLET BY MOUTH ONCE DAILY, Disp: 90 tablet, Rfl: 4 .  Multiple Vitamin (MULTIVITAMIN)  tablet, Take 1 tablet by mouth daily., Disp: , Rfl:  .  naloxone (NARCAN) nasal spray 4 mg/0.1 mL, Place into the nose., Disp: , Rfl:  .  oxymorphone (OPANA) 10 MG tablet, Take 5-10 mg by mouth 4 (four) times daily as needed. , Disp: , Rfl:  .  promethazine (PHENERGAN) 6.25 MG/5ML syrup, Take 10 mLs (12.5 mg total) by mouth every 6 (six) hours as needed for nausea or vomiting. (Patient not taking: Reported on 07/03/2018), Disp: 120 mL, Rfl: 0 .  promethazine-dextromethorphan (PROMETHAZINE-DM) 6.25-15 MG/5ML syrup, Take 5 mLs by mouth at bedtime as needed for cough. (Patient not taking: Reported on 07/03/2018), Disp: 118 mL, Rfl: 0  Review of Systems  Social History   Tobacco Use  . Smoking status: Never Smoker  . Smokeless tobacco: Never Used  Substance Use Topics  . Alcohol use: Yes    Alcohol/week: 0.0 standard drinks    Comment: Rare      Objective:   There were no vitals taken for this visit. There were no vitals filed for this visit.   Physical Exam      Assessment & Plan        Trey Sailors, PA-C  [facility]

## 2018-10-28 ENCOUNTER — Ambulatory Visit (INDEPENDENT_AMBULATORY_CARE_PROVIDER_SITE_OTHER): Payer: 59 | Admitting: Physician Assistant

## 2018-10-28 ENCOUNTER — Telehealth: Payer: Self-pay

## 2018-10-28 DIAGNOSIS — H109 Unspecified conjunctivitis: Secondary | ICD-10-CM | POA: Diagnosis not present

## 2018-10-28 DIAGNOSIS — M79602 Pain in left arm: Secondary | ICD-10-CM | POA: Diagnosis not present

## 2018-10-28 MED ORDER — OFLOXACIN 0.3 % OP SOLN: 1.0000 [drp] | Freq: Four times a day (QID) | OPHTHALMIC | 0 refills | Status: AC

## 2018-10-28 NOTE — Telephone Encounter (Signed)
Patient called stating that she missed her appointment yesterday. She forgot about it due to trying to get her kids set up with online classes. Patient would like to set up an e visit today. Patient states she also woke up this morning with her eyes matted shut. She would like to be seen for this through e visit along with the arm pain. Please advise if appropriate. E Mail in demographics is correct. Please advise.

## 2018-10-28 NOTE — Progress Notes (Signed)
Subjective:    Patient ID: Michaela Jones, female    DOB: 31-Jul-1975, 43 y.o.   MRN: [id number]   Michaela Jones is a 43 y.o. female presenting on 10/28/2018 for Eye Drainage and Arm Pain  Virtual Visit via Video Note  I connected with Jasneet Mckiernan Lucien on 10/29/18 at  2:40 PM EDT by a video enabled telemedicine application and verified that I am speaking with the correct person using two identifiers.   I discussed the limitations of evaluation and management by telemedicine and the availability of in person appointments. The patient expressed understanding and agreed to proceed.  HPI   Reports she woke up today with right eye red, swollen and draining purulent discharge. Had to pry her eye open this morning. Her left eye has similar symptoms to a lesser extent. She reports wearing contacts but not since onset, has been wearing glasses. Mild sensitivity to light.   Also has questions about her left arm pain. Patient has a history of chronic pain that is managed by [facility] and she is currently on Opana 5-10 mg four times daily. She is followed by psychiatry and is currently on Xanax 2 mg nightly, armodafinil 150 mg daily.   Reports left arm pain for one year, numbness for one year. Had been seeing chiropracter 3 times per week since then without relief. Got EMG of left arm via pain clinic and it was negative in 09/2018. Keppra was added for neuropathic pain by her pain management doctor on 10/16/2018 and cymbalta was discontinued. She is asking for medication for break through pain.   Social History   Tobacco Use  . Smoking status: Never Smoker  . Smokeless tobacco: Never Used  Substance Use Topics  . Alcohol use: Yes    Alcohol/week: 0.0 standard drinks    Comment: Rare  . Drug use: No    Review of Systems Per HPI unless specifically indicated above     Objective:    There were no vitals taken for this visit.  Wt Readings from Last 3 Encounters:  02/23/18 240 lb (108.9 kg)   12/17/17 242 lb (109.8 kg)  12/10/17 243 lb (110.2 kg)    Physical Exam Constitutional:      Appearance: Normal appearance.  Eyes:     General:        Right eye: Discharge present.        Left eye: No discharge.     Conjunctiva/sclera:     Right eye: Right conjunctiva is injected. Exudate present.     Left eye: Left conjunctiva is not injected. No exudate. Pulmonary:     Effort: Pulmonary effort is normal. No respiratory distress.  Neurological:     Mental Status: She is alert.  Psychiatric:        Mood and Affect: Mood normal.        Behavior: Behavior normal.    Results for orders placed or performed during the hospital encounter of 02/23/18  CBC - if new onset seizures  Result Value Ref Range   WBC 9.6 3.6 - 11.0 K/uL   RBC 4.01 3.80 - 5.20 MIL/uL   Hemoglobin 12.0 12.0 - 16.0 g/dL   HCT 12.2 44.9 - 75.3 %   MCV 90.3 80.0 - 100.0 fL   MCH 29.9 26.0 - 34.0 pg   MCHC 33.1 32.0 - 36.0 g/dL   RDW 00.5 (H) 11.0 - 21.1 %   Platelets 351 150 - 440 K/uL  Basic metabolic  panel  Result Value Ref Range   Sodium 136 135 - 145 mmol/L   Potassium 3.7 3.5 - 5.1 mmol/L   Chloride 102 98 - 111 mmol/L   CO2 24 22 - 32 mmol/L   Glucose, Bld 121 (H) 70 - 99 mg/dL   BUN 13 6 - 20 mg/dL   Creatinine, Ser 9.76 0.44 - 1.00 mg/dL   Calcium 8.5 (L) 8.9 - 10.3 mg/dL   GFR calc non Af Amer >60 >60 mL/min   GFR calc Af Amer >60 >60 mL/min   Anion gap 10 5 - 15  hCG, quantitative, pregnancy  Result Value Ref Range   hCG, Beta Chain, Quant, S <1 <5 mIU/mL      Assessment & Plan:  1. Bacterial conjunctivitis  - ofloxacin (OCUFLOX) 0.3 % ophthalmic solution; Place 1 drop into the right eye 4 (four) times daily for 7 days.  Dispense: 1.4 mL; Refill: 0  2. Left arm pain  Patient managed by pain management and currently on 60-120 mg MEDD as well as Xanax 2 mg nightly and armodafinil. I do not feel comfortable adding medication for breakthrough pain with her current regimen. I have reviewed  pain management notes from 10/16/2018 and it seems her provider has three other medication alternatives including flector patch, amitriptyline and Trileptal. Patient not aware of this. Encouraged her to follow up with pain management and she is agreeable.    Follow up plan: Return if symptoms worsen or fail to improve.  Osvaldo Angst, PA-C [facility] 10/30/2018, 8:56 AM

## 2018-10-28 NOTE — Telephone Encounter (Signed)
It's fine. May be difficult to assess her arm pain through e-visit and she may need eventual office visit but eye issue should be OK. If it can be scheduled before 3 then it's fine, it not she can be on schedule tomorrow as there are no more appointments after 3.

## 2018-11-09 ENCOUNTER — Telehealth: Payer: Self-pay | Admitting: Psychiatry

## 2018-11-09 NOTE — Telephone Encounter (Signed)
Patient need refill on Cymbalta and Xanax to be sent to [facility] has appt. In May

## 2018-11-09 NOTE — Telephone Encounter (Signed)
Need to contact pt because [facility] is not currently open, will call tomorrow to verify send to [facility] instead.

## 2018-11-10 NOTE — Telephone Encounter (Signed)
Left voicemail to let pt know [facility] is closed and everything is being submitted to [facility], instructed to call back with confirmation to submit

## 2018-11-11 DIAGNOSIS — M5417 Radiculopathy, lumbosacral region: Secondary | ICD-10-CM | POA: Diagnosis not present

## 2018-11-11 DIAGNOSIS — M961 Postlaminectomy syndrome, not elsewhere classified: Secondary | ICD-10-CM | POA: Diagnosis not present

## 2018-11-11 DIAGNOSIS — G894 Chronic pain syndrome: Secondary | ICD-10-CM | POA: Diagnosis not present

## 2018-11-11 DIAGNOSIS — Z5181 Encounter for therapeutic drug level monitoring: Secondary | ICD-10-CM | POA: Diagnosis not present

## 2018-11-12 NOTE — Telephone Encounter (Signed)
I have left pt. A VM on Tues, Wed. And today. Still has not returned call yet.

## 2018-11-13 ENCOUNTER — Ambulatory Visit: Payer: 59 | Admitting: Psychiatry

## 2018-11-16 NOTE — Telephone Encounter (Signed)
Left another VM today.

## 2018-11-20 ENCOUNTER — Other Ambulatory Visit: Payer: Self-pay

## 2018-11-20 DIAGNOSIS — F411 Generalized anxiety disorder: Secondary | ICD-10-CM

## 2018-11-20 MED ORDER — DULOXETINE HCL 60 MG PO CPEP: 60.0000 mg | ORAL_CAPSULE | Freq: Every day | ORAL | 0 refills | Status: DC

## 2018-11-20 NOTE — Telephone Encounter (Signed)
cymbalta submitted, xanax not due

## 2018-12-02 ENCOUNTER — Ambulatory Visit: Payer: 59 | Admitting: Psychiatry

## 2018-12-02 ENCOUNTER — Other Ambulatory Visit: Payer: Self-pay

## 2018-12-02 NOTE — Progress Notes (Deleted)
Michaela Jones [id number] Dec 25, 1975 43 y.o.  Virtual Visit via Telephone Note  I connected with@ on 12/02/18 at  1:30 PM EDT by telephone and verified that I am speaking with the correct person using two identifiers.   I discussed the limitations, risks, security and privacy concerns of performing an evaluation and management service by telephone and the availability of in person appointments. I also discussed with the patient that there may be a patient responsible charge related to this service. The patient expressed understanding and agreed to proceed.   I discussed the assessment and treatment plan with the patient. The patient was provided an opportunity to ask questions and all were answered. The patient agreed with the plan and demonstrated an understanding of the instructions.   The patient was advised to call back or seek an in-person evaluation if the symptoms worsen or if the condition fails to improve as anticipated.  I provided *** minutes of non-face-to-face time during this encounter.  The patient was located at home.  The provider was located at home.   Corie Chiquito, PMHNP   Subjective:   Patient ID:  Michaela Jones is a 43 y.o. (DOB 12-May-1976) female.  Chief Complaint: No chief complaint on file.   HPI MARAI MAROS presents for follow-up of ***   Review of Systems:  Review of Systems  Medications: {medication reviewed/display:3041432}  Current Outpatient Medications  Medication Sig Dispense Refill  . albuterol (PROVENTIL HFA;VENTOLIN HFA) 108 (90 Base) MCG/ACT inhaler Inhale 2 puffs into the lungs every 6 (six) hours as needed for wheezing or shortness of breath.    Marland Kitchen albuterol (PROVENTIL) (5 MG/ML) 0.5% nebulizer solution Inhale into the lungs.    . ALPRAZolam (XANAX) 1 MG tablet Take 2 tablets (2 mg total) by mouth at bedtime. 60 tablet 0  . Armodafinil 150 MG tablet Take 150 mg by mouth every morning.    . celecoxib (CELEBREX) 200 MG capsule Take 200 mg by  mouth as needed.     . cetirizine (ZYRTEC) 10 MG tablet Take 10 mg by mouth daily as needed for allergies.    . Cholecalciferol (VITAMIN D) 50 MCG (2000 UT) CAPS Take by mouth.    . cyanocobalamin (,VITAMIN B-12,) 1000 MCG/ML injection INJECT 1 MILLILITER (ML) ONCE MONTHLY 6 mL 4  . DULoxetine (CYMBALTA) 30 MG capsule Take 1 capsule q am x 1 week, then increase to 2 capsules po q am 30 capsule 0  . DULoxetine (CYMBALTA) 60 MG capsule Take 1 capsule (60 mg total) by mouth daily for 30 days. 30 capsule 0  . fluticasone (FLONASE) 50 MCG/ACT nasal spray Place 2 sprays into both nostrils daily. 16 g 6  . levothyroxine (SYNTHROID, LEVOTHROID) 112 MCG tablet TAKE 1 TABLET BY MOUTH ONCE DAILY 90 tablet 4  . Multiple Vitamin (MULTIVITAMIN) tablet Take 1 tablet by mouth daily.    . naloxone (NARCAN) nasal spray 4 mg/0.1 mL Place into the nose.    Marland Kitchen oxymorphone (OPANA) 10 MG tablet Take 5-10 mg by mouth 4 (four) times daily as needed.     . promethazine (PHENERGAN) 6.25 MG/5ML syrup Take 10 mLs (12.5 mg total) by mouth every 6 (six) hours as needed for nausea or vomiting. (Patient not taking: Reported on 07/03/2018) 120 mL 0  . promethazine-dextromethorphan (PROMETHAZINE-DM) 6.25-15 MG/5ML syrup Take 5 mLs by mouth at bedtime as needed for cough. (Patient not taking: Reported on 07/03/2018) 118 mL 0   No current facility-administered medications for this visit.  Medication Side Effects: {Medication Side Effects (Optional):21014029}  Allergies:  Allergies  Allergen Reactions  . Lyrica [Pregabalin] Other (See Comments)    Patient reports neurological problem. Speech disturbances and unable to control motor skills  . Gabapentin Swelling  . Nsaids Other (See Comments)    Avoids due to gastric bypass  . Penicillins Rash    Past Medical History:  Diagnosis Date  . Anemia   . Chronic back pain   . Complication of anesthesia    HARD TO WAKE UP/OXYGEN LEVEL DROPPED AFTER BACK SURGERY X1  .  Constipation, chronic   . Depression   . Edema   . History of seasonal allergies    pt has inhalers for this if needed  . Hypotension   . Hypothyroid   . Insomnia   . Lymphedema of both lower extremities   . Migraine   . Restless leg syndrome   . Vitamin D deficiency     Family History  Problem Relation Age of Onset  . Pulmonary embolism Mother   . Depression Mother   . Asthma Mother   . Heart attack Father   . Cancer Father     Social History   Socioeconomic History  . Marital status: Married    Spouse name: Not on file  . Number of children: Not on file  . Years of education: Not on file  . Highest education level: Not on file  Occupational History  . Not on file  Social Needs  . Financial resource strain: Not on file  . Food insecurity:    Worry: Not on file    Inability: Not on file  . Transportation needs:    Medical: Not on file    Non-medical: Not on file  Tobacco Use  . Smoking status: Never Smoker  . Smokeless tobacco: Never Used  Substance and Sexual Activity  . Alcohol use: Yes    Alcohol/week: 0.0 standard drinks    Comment: Rare  . Drug use: No  . Sexual activity: Not on file  Lifestyle  . Physical activity:    Days per week: Not on file    Minutes per session: Not on file  . Stress: Not on file  Relationships  . Social connections:    Talks on phone: Not on file    Gets together: Not on file    Attends religious service: Not on file    Active member of club or organization: Not on file    Attends meetings of clubs or organizations: Not on file    Relationship status: Not on file  . Intimate partner violence:    Fear of current or ex partner: Not on file    Emotionally abused: Not on file    Physically abused: Not on file    Forced sexual activity: Not on file  Other Topics Concern  . Not on file  Social History Narrative  . Not on file    Past Medical History, Surgical history, Social history, and Family history were reviewed and  updated as appropriate.   Please see review of systems for further details on the patient's review from today.   Objective:   Physical Exam:  There were no vitals taken for this visit.  Physical Exam  Lab Review:     Component Value Date/Time   NA 136 02/23/2018 1645   NA 137 03/27/2016 1217   NA 139 12/03/2012 1136   K 3.7 02/23/2018 1645   K 4.0 12/03/2012 1136  CL 102 02/23/2018 1645   CL 105 12/03/2012 1136   CO2 24 02/23/2018 1645   CO2 26 12/03/2012 1136   GLUCOSE 121 (H) 02/23/2018 1645   GLUCOSE 77 12/03/2012 1136   BUN 13 02/23/2018 1645   BUN 8 03/27/2016 1217   BUN 16 12/03/2012 1136   CREATININE 0.72 02/23/2018 1645   CREATININE 0.46 (L) 12/03/2012 1136   CALCIUM 8.5 (L) 02/23/2018 1645   CALCIUM 9.0 12/03/2012 1136   PROT 6.3 03/27/2016 1217   ALBUMIN 4.0 03/27/2016 1217   AST 20 03/27/2016 1217   ALT 18 03/27/2016 1217   ALKPHOS 74 03/27/2016 1217   BILITOT 0.3 03/27/2016 1217   GFRNONAA >60 02/23/2018 1645   GFRNONAA >60 12/03/2012 1136   GFRAA >60 02/23/2018 1645   GFRAA >60 12/03/2012 1136       Component Value Date/Time   WBC 9.6 02/23/2018 1612   RBC 4.01 02/23/2018 1612   HGB 12.0 02/23/2018 1612   HGB 10.9 (L) 03/27/2016 1217   HCT 36.2 02/23/2018 1612   HCT 33.2 (L) 03/27/2016 1217   PLT 351 02/23/2018 1612   PLT 275 03/27/2016 1217   MCV 90.3 02/23/2018 1612   MCV 92 03/27/2016 1217   MCV 101 (H) 06/15/2013 1042   MCH 29.9 02/23/2018 1612   MCHC 33.1 02/23/2018 1612   RDW 17.7 (H) 02/23/2018 1612   RDW 14.3 03/27/2016 1217   RDW 14.5 06/15/2013 1042   LYMPHSABS 1.1 03/27/2016 1217   LYMPHSABS 3.5 06/15/2013 1042   MONOABS 0.9 06/15/2013 1042   EOSABS 0.1 03/27/2016 1217   EOSABS 0.1 06/15/2013 1042   BASOSABS 0.0 03/27/2016 1217   BASOSABS 0.1 06/15/2013 1042    No results found for: POCLITH, LITHIUM   No results found for: PHENYTOIN, PHENOBARB, VALPROATE, CBMZ   .res Assessment: Plan:    No diagnosis  found.  Please see After Visit Summary for patient specific instructions.  No future appointments.  No orders of the defined types were placed in this encounter.     -------------------------------

## 2018-12-08 DIAGNOSIS — Z5181 Encounter for therapeutic drug level monitoring: Secondary | ICD-10-CM | POA: Diagnosis not present

## 2018-12-12 NOTE — Progress Notes (Signed)
Attempted to reach pt for scheduled televisit x 3. No answer. Left message recommending that she contact office to re-schedule.

## 2018-12-16 ENCOUNTER — Other Ambulatory Visit: Payer: Self-pay

## 2018-12-16 MED ORDER — ALPRAZOLAM 1 MG PO TABS: 2.0000 mg | ORAL_TABLET | Freq: Every day | ORAL | 0 refills | Status: DC

## 2018-12-22 ENCOUNTER — Telehealth: Payer: Self-pay | Admitting: Psychiatry

## 2018-12-22 ENCOUNTER — Ambulatory Visit: Payer: 59 | Admitting: Psychiatry

## 2018-12-22 ENCOUNTER — Other Ambulatory Visit: Payer: Self-pay

## 2018-12-22 DIAGNOSIS — F5101 Primary insomnia: Secondary | ICD-10-CM

## 2018-12-22 DIAGNOSIS — F411 Generalized anxiety disorder: Secondary | ICD-10-CM

## 2018-12-22 MED ORDER — ARMODAFINIL 150 MG PO TABS: 150.0000 mg | ORAL_TABLET | ORAL | 2 refills | Status: DC

## 2018-12-22 MED ORDER — DULOXETINE HCL 60 MG PO CPEP: 60.0000 mg | ORAL_CAPSULE | Freq: Every day | ORAL | 0 refills | Status: DC

## 2018-12-22 MED ORDER — ALPRAZOLAM 1 MG PO TABS: 2.0000 mg | ORAL_TABLET | Freq: Every day | ORAL | 0 refills | Status: DC

## 2018-12-22 NOTE — Addendum Note (Signed)
Addended by: Derenda Mis on: 12/22/2018 02:21 PM   Modules accepted: Orders

## 2018-12-22 NOTE — Telephone Encounter (Signed)
Patient will need refills on all medications Cymbalta, Xanax and Neuvigil to be sent to [facility] she rs for 06/02

## 2018-12-22 NOTE — Progress Notes (Signed)
Attempted to reach pt x 3. Unable to reach pt. Left voicemail recommending that she contact office to re-schedule.

## 2018-12-29 ENCOUNTER — Other Ambulatory Visit: Payer: Self-pay

## 2018-12-29 ENCOUNTER — Ambulatory Visit (INDEPENDENT_AMBULATORY_CARE_PROVIDER_SITE_OTHER): Payer: 59 | Admitting: Psychiatry

## 2018-12-29 ENCOUNTER — Encounter: Payer: Self-pay | Admitting: Psychiatry

## 2018-12-29 DIAGNOSIS — F5101 Primary insomnia: Secondary | ICD-10-CM | POA: Diagnosis not present

## 2018-12-29 DIAGNOSIS — F411 Generalized anxiety disorder: Secondary | ICD-10-CM | POA: Diagnosis not present

## 2018-12-29 MED ORDER — ALPRAZOLAM 1 MG PO TABS: ORAL_TABLET | ORAL | 0 refills | Status: DC

## 2018-12-29 MED ORDER — DULOXETINE HCL 60 MG PO CPEP: 60.0000 mg | ORAL_CAPSULE | Freq: Every day | ORAL | 0 refills | Status: DC

## 2018-12-29 NOTE — Progress Notes (Signed)
Michaela Jones [id number] September 19, 1975 43 y.o.  Subjective:   Patient ID:  Michaela Jones is a 43 y.o. (DOB January 16, 1976) female.  Chief Complaint:  Chief Complaint  Patient presents with  . Anxiety  . Follow-up    h/o Depression    HPI Michaela Jones presents to the office today for follow-up of anxiety, depression, and insomnia. She reports anxiety in response to multiple acute psychosocial stressors. Nephew was born May 7th and is having open heart surgery on 01/05/19. Reports that she has been providing care for nephew and grown attached to him.  Reports that this has caused her anxiety. Reports that her court date was recently moved up and this has triggered her anxiety as well and is coming up. Reports that she has not been leaving her home and has some claustrophobia. She reports that she has recently had chest tightness with anxiety and has noticed that her hands are shaking at times with identifiable stressors. Has been wringing her hands. Reports frequent worry and rumination.   Reports that she feels that Cymbalta has been effective for pain and "I don't feel as depressed" despite pandemic. Reports that she had been sleeping well and is now awakening q 4 hours and is worried. Reports that appetite has been decreased with acute stress and has forgotten to eat. She reports that her motivation is increased for some things and lower for other tasks, such as household chores and attributes this to feeling overwhelmed. Concentration is adequate. Reports that she has been reading and this is helpful with anxiety. Denies SI.   Reports that her pain has improved with combination of Keppra and Cymbalta.  Review of Systems:  Review of Systems  Musculoskeletal: Positive for back pain. Negative for gait problem.  Neurological: Negative for tremors.       Reports that she has had some neuropathy down her left arm  Psychiatric/Behavioral:       Please refer to HPI    Medications: I have reviewed the  patient's current medications.  Current Outpatient Medications  Medication Sig Dispense Refill  . albuterol (PROVENTIL HFA;VENTOLIN HFA) 108 (90 Base) MCG/ACT inhaler Inhale 2 puffs into the lungs every 6 (six) hours as needed for wheezing or shortness of breath.    Marland Kitchen albuterol (PROVENTIL) (5 MG/ML) 0.5% nebulizer solution Inhale into the lungs.    . Armodafinil 150 MG tablet Take 1 tablet (150 mg total) by mouth every morning for 30 days. 30 tablet 2  . celecoxib (CELEBREX) 200 MG capsule Take 200 mg by mouth as needed.     . cetirizine (ZYRTEC) 10 MG tablet Take 10 mg by mouth daily as needed for allergies.    . Cholecalciferol (VITAMIN D) 50 MCG (2000 UT) CAPS Take by mouth.    . cyanocobalamin (,VITAMIN B-12,) 1000 MCG/ML injection INJECT 1 MILLILITER (ML) ONCE MONTHLY 6 mL 4  . DULoxetine (CYMBALTA) 60 MG capsule Take 1 capsule (60 mg total) by mouth daily. 90 capsule 0  . levETIRAcetam (KEPPRA) 1000 MG tablet Take by mouth 2 (two) times daily.     Marland Kitchen levothyroxine (SYNTHROID, LEVOTHROID) 112 MCG tablet TAKE 1 TABLET BY MOUTH ONCE DAILY 90 tablet 4  . Multiple Vitamin (MULTIVITAMIN) tablet Take 1 tablet by mouth daily.    Marland Kitchen oxymorphone (OPANA) 10 MG tablet Take 5-10 mg by mouth 4 (four) times daily as needed.     Melene Muller ON 01/06/2019] ALPRAZolam (XANAX) 1 MG tablet Take 1/2 -1 tab po TID prn  anxiety 90 tablet 0  . fluticasone (FLONASE) 50 MCG/ACT nasal spray Place 2 sprays into both nostrils daily. (Patient not taking: Reported on 12/29/2018) 16 g 6  . naloxone (NARCAN) nasal spray 4 mg/0.1 mL Place into the nose.    . promethazine (PHENERGAN) 6.25 MG/5ML syrup Take 10 mLs (12.5 mg total) by mouth every 6 (six) hours as needed for nausea or vomiting. (Patient not taking: Reported on 07/03/2018) 120 mL 0  . promethazine-dextromethorphan (PROMETHAZINE-DM) 6.25-15 MG/5ML syrup Take 5 mLs by mouth at bedtime as needed for cough. (Patient not taking: Reported on 07/03/2018) 118 mL 0   No current  facility-administered medications for this visit.     Medication Side Effects: None  Allergies:  Allergies  Allergen Reactions  . Lyrica [Pregabalin] Other (See Comments)    Patient reports neurological problem. Speech disturbances and unable to control motor skills  . Gabapentin Swelling  . Nsaids Other (See Comments)    Avoids due to gastric bypass  . Penicillins Rash    Past Medical History:  Diagnosis Date  . Anemia   . Chronic back pain   . Complication of anesthesia    HARD TO WAKE UP/OXYGEN LEVEL DROPPED AFTER BACK SURGERY X1  . Constipation, chronic   . Depression   . Edema   . History of seasonal allergies    pt has inhalers for this if needed  . Hypotension   . Hypothyroid   . Insomnia   . Lymphedema of both lower extremities   . Migraine   . Restless leg syndrome   . Vitamin D deficiency     Family History  Problem Relation Age of Onset  . Pulmonary embolism Mother   . Depression Mother   . Asthma Mother   . Heart attack Father   . Cancer Father     Social History   Socioeconomic History  . Marital status: Married    Spouse name: Not on file  . Number of children: Not on file  . Years of education: Not on file  . Highest education level: Not on file  Occupational History  . Not on file  Social Needs  . Financial resource strain: Not on file  . Food insecurity:    Worry: Not on file    Inability: Not on file  . Transportation needs:    Medical: Not on file    Non-medical: Not on file  Tobacco Use  . Smoking status: Never Smoker  . Smokeless tobacco: Never Used  Substance and Sexual Activity  . Alcohol use: Yes    Alcohol/week: 0.0 standard drinks    Comment: Rare  . Drug use: No  . Sexual activity: Not on file  Lifestyle  . Physical activity:    Days per week: Not on file    Minutes per session: Not on file  . Stress: Not on file  Relationships  . Social connections:    Talks on phone: Not on file    Gets together: Not on file     Attends religious service: Not on file    Active member of club or organization: Not on file    Attends meetings of clubs or organizations: Not on file    Relationship status: Not on file  . Intimate partner violence:    Fear of current or ex partner: Not on file    Emotionally abused: Not on file    Physically abused: Not on file    Forced sexual activity: Not on file  Other Topics Concern  . Not on file  Social History Narrative  . Not on file    Past Medical History, Surgical history, Social history, and Family history were reviewed and updated as appropriate.   Please see review of systems for further details on the patient's review from today.   Objective:   Physical Exam:  There were no vitals taken for this visit.  Physical Exam Constitutional:      General: She is not in acute distress.    Appearance: She is well-developed.  Musculoskeletal:        General: No deformity.  Neurological:     Mental Status: She is alert and oriented to person, place, and time.     Coordination: Coordination normal.  Psychiatric:        Mood and Affect: Mood is anxious. Mood is not depressed. Affect is not labile, blunt, angry or inappropriate.        Speech: Speech normal.        Behavior: Behavior is cooperative.        Thought Content: Thought content normal. Thought content does not include homicidal or suicidal ideation. Thought content does not include homicidal or suicidal plan.        Judgment: Judgment normal.     Comments: Insight intact. No auditory or visual hallucinations. No delusions. Presents as tense.  Sitting on edge of chair for most of the exam.     Lab Review:     Component Value Date/Time   NA 136 02/23/2018 1645   NA 137 03/27/2016 1217   NA 139 12/03/2012 1136   K 3.7 02/23/2018 1645   K 4.0 12/03/2012 1136   CL 102 02/23/2018 1645   CL 105 12/03/2012 1136   CO2 24 02/23/2018 1645   CO2 26 12/03/2012 1136   GLUCOSE 121 (H) 02/23/2018 1645    GLUCOSE 77 12/03/2012 1136   BUN 13 02/23/2018 1645   BUN 8 03/27/2016 1217   BUN 16 12/03/2012 1136   CREATININE 0.72 02/23/2018 1645   CREATININE 0.46 (L) 12/03/2012 1136   CALCIUM 8.5 (L) 02/23/2018 1645   CALCIUM 9.0 12/03/2012 1136   PROT 6.3 03/27/2016 1217   ALBUMIN 4.0 03/27/2016 1217   AST 20 03/27/2016 1217   ALT 18 03/27/2016 1217   ALKPHOS 74 03/27/2016 1217   BILITOT 0.3 03/27/2016 1217   GFRNONAA >60 02/23/2018 1645   GFRNONAA >60 12/03/2012 1136   GFRAA >60 02/23/2018 1645   GFRAA >60 12/03/2012 1136       Component Value Date/Time   WBC 9.6 02/23/2018 1612   RBC 4.01 02/23/2018 1612   HGB 12.0 02/23/2018 1612   HGB 10.9 (L) 03/27/2016 1217   HCT 36.2 02/23/2018 1612   HCT 33.2 (L) 03/27/2016 1217   PLT 351 02/23/2018 1612   PLT 275 03/27/2016 1217   MCV 90.3 02/23/2018 1612   MCV 92 03/27/2016 1217   MCV 101 (H) 06/15/2013 1042   MCH 29.9 02/23/2018 1612   MCHC 33.1 02/23/2018 1612   RDW 17.7 (H) 02/23/2018 1612   RDW 14.3 03/27/2016 1217   RDW 14.5 06/15/2013 1042   LYMPHSABS 1.1 03/27/2016 1217   LYMPHSABS 3.5 06/15/2013 1042   MONOABS 0.9 06/15/2013 1042   EOSABS 0.1 03/27/2016 1217   EOSABS 0.1 06/15/2013 1042   BASOSABS 0.0 03/27/2016 1217   BASOSABS 0.1 06/15/2013 1042    No results found for: POCLITH, LITHIUM   No results found for: PHENYTOIN, PHENOBARB, VALPROATE, CBMZ   .  res Assessment: Plan:   Discussed increasing alprazolam to 1/2-1 tab 3 times daily as needed for anxiety for 1 month only due to acute stress.  Patient reports she discussed increased anxiety with her pain medication provider and they agreed that she may need a temporary adjustment in medication for the next month due to acute stressors.  Advised patient to notify pain management of increase in alprazolam for the next month and she agrees to do so. Continue Cymbalta 60 mg daily for depression and anxiety. Continue Nuvigil 150 mg daily as needed for  concentration. Patient to follow-up in 4 weeks or sooner if clinically indicated. Patient advised to contact office with any questions, adverse effects, or acute worsening in signs and symptoms.  Generalized anxiety disorder - Plan: ALPRAZolam (XANAX) 1 MG tablet, DULoxetine (CYMBALTA) 60 MG capsule  Primary insomnia - Plan: ALPRAZolam (XANAX) 1 MG tablet  Please see After Visit Summary for patient specific instructions.  Future Appointments  Date Time Provider Department Center  01/26/2019  1:30 PM Corie Chiquito, PMHNP CP-[facility] None    No orders of the defined types were placed in this encounter.     -------------------------------

## 2019-01-12 ENCOUNTER — Telehealth: Payer: Self-pay | Admitting: Psychiatry

## 2019-01-12 DIAGNOSIS — Z5181 Encounter for therapeutic drug level monitoring: Secondary | ICD-10-CM | POA: Diagnosis not present

## 2019-01-12 NOTE — Telephone Encounter (Signed)
Her husband reports that pt had a negative court outcome today and has had anxiety and depressed mood after this. Husband reports that pt texted some close friends/family this afternoon that others would be better off without her. Husband reports that pt has been spending time with family and seems to be more calm. He denies any safety concerns at time of call. He reports that pt will continue to be with family around the clock.   Discussed safety plan and recommended that he take pt to [facility] ER if there are any acute safety issues.  Offered to see pt emergently tomorrow afternoon at 2 pm. Husband reports that he will discuss this with his wife and make decision in the morning based on work schedule.  Discussed that it would likely be helpful for pt to begin regular therapy sessions during current legal process and husband agrees that this may be a consideration. Provider will assist with possible referral to therapy.   Husband encouraged to call office with any changes or concerns.

## 2019-01-12 NOTE — Telephone Encounter (Signed)
Patient's husband called and said that the court case did not go well today even though they will appeal. Michaela Jones or Michaela Jones came home and had a meltdown and has called and texted everyone and said she would be better off not here so husband is worried and will have to do a sucide watch. He wanted you to be aware and do you have any suggestions of what he can do. Please call him at [phone]

## 2019-01-13 ENCOUNTER — Ambulatory Visit: Payer: 59 | Admitting: Psychiatry

## 2019-01-13 ENCOUNTER — Encounter: Payer: Self-pay | Admitting: Psychiatry

## 2019-01-13 ENCOUNTER — Other Ambulatory Visit: Payer: Self-pay

## 2019-01-13 DIAGNOSIS — F5101 Primary insomnia: Secondary | ICD-10-CM

## 2019-01-13 DIAGNOSIS — F4323 Adjustment disorder with mixed anxiety and depressed mood: Secondary | ICD-10-CM

## 2019-01-13 DIAGNOSIS — F411 Generalized anxiety disorder: Secondary | ICD-10-CM | POA: Diagnosis not present

## 2019-01-13 MED ORDER — QUETIAPINE FUMARATE 25 MG PO TABS: ORAL_TABLET | ORAL | 2 refills | Status: DC

## 2019-01-13 MED ORDER — DULOXETINE HCL 30 MG PO CPEP: 30.0000 mg | ORAL_CAPSULE | Freq: Every day | ORAL | 0 refills | Status: DC

## 2019-01-13 MED ORDER — ALPRAZOLAM 1 MG PO TABS: ORAL_TABLET | ORAL | 0 refills | Status: DC

## 2019-01-13 NOTE — Progress Notes (Signed)
Michaela Jones [id number] 02-28-1976 43 y.o.  Subjective:   Patient ID:  Michaela Jones is a 43 y.o. (DOB 02-28-1976) female.  Chief Complaint:  Chief Complaint  Patient presents with  . Anxiety  . Depression  . Insomnia    HPI Michaela Jones presents to the office today emergently after worsening mood and anxiety after court date yesterday. She is accompanied by her husband.  Reports that she is having anxiety in response to court outcome and how this is going to affect her and her family. "My whole world has just fallen apart." Husband reports that pt will seem ok and then "suddenly drop... something triggered the thoughts." Husband reports that pt is having periods of hopelessness. Reports that she could not sleep until 5-6 am and slept about 6 hours. She reports that her appetite has been poor and she is feeling nauseous. Reports that increase in Alprazolam at last visit was helpful and pain management specialist is aware.   Reports that she has feelings that others would be better off without her. Denies suicidal ideation, intent, or plan.   Has been staying with infant nephew in the hospital following his heart surgery.   Past Psychiatric Medication Trials: Cymbalta Sertraline Remeron Buspar Nuvigil Provigil- increased activation Ritalin Concerta Adderall- Not as effective Strattera Klonopin Xanax Vistaril- Ineffective Ambien- Effective. Caused parasomnias.  Lunesta Temazepam Trazodone-Ineffective Belsomra Doxepin Silenor Seroquel- wt gain Gabapentin- Stuttering Lyrica Trileptal- tingling in extremities   Review of Systems:  Review of Systems  Medications: I have reviewed the patient's current medications.  Current Outpatient Medications  Medication Sig Dispense Refill  . albuterol (PROVENTIL HFA;VENTOLIN HFA) 108 (90 Base) MCG/ACT inhaler Inhale 2 puffs into the lungs every 6 (six) hours as needed for wheezing or shortness of breath.    Marland Kitchen. albuterol (PROVENTIL)  (5 MG/ML) 0.5% nebulizer solution Inhale into the lungs.    Melene Muller. [START ON 02/03/2019] ALPRAZolam (XANAX) 1 MG tablet Take 1/2 -1 tab po TID prn anxiety 90 tablet 0  . Armodafinil 150 MG tablet Take 1 tablet (150 mg total) by mouth every morning for 30 days. 30 tablet 2  . celecoxib (CELEBREX) 200 MG capsule Take 200 mg by mouth as needed.     . cetirizine (ZYRTEC) 10 MG tablet Take 10 mg by mouth daily as needed for allergies.    . Cholecalciferol (VITAMIN D) 50 MCG (2000 UT) CAPS Take by mouth.    . cyanocobalamin (,VITAMIN B-12,) 1000 MCG/ML injection INJECT 1 MILLILITER (ML) ONCE MONTHLY 6 mL 4  . DULoxetine (CYMBALTA) 30 MG capsule Take 1 capsule (30 mg total) by mouth daily. Take with a 60 mg capsule to equal total dose of 90 mg. 90 capsule 0  . DULoxetine (CYMBALTA) 60 MG capsule Take 1 capsule (60 mg total) by mouth daily. 90 capsule 0  . fluticasone (FLONASE) 50 MCG/ACT nasal spray Place 2 sprays into both nostrils daily. (Patient not taking: Reported on 12/29/2018) 16 g 6  . levETIRAcetam (KEPPRA) 1000 MG tablet Take by mouth 2 (two) times daily.     Marland Kitchen. levothyroxine (SYNTHROID, LEVOTHROID) 112 MCG tablet TAKE 1 TABLET BY MOUTH ONCE DAILY 90 tablet 4  . Multiple Vitamin (MULTIVITAMIN) tablet Take 1 tablet by mouth daily.    . naloxone (NARCAN) nasal spray 4 mg/0.1 mL Place into the nose.    Marland Kitchen. oxymorphone (OPANA) 10 MG tablet Take 5-10 mg by mouth 4 (four) times daily as needed.     . promethazine (PHENERGAN) 6.25  MG/5ML syrup Take 10 mLs (12.5 mg total) by mouth every 6 (six) hours as needed for nausea or vomiting. (Patient not taking: Reported on 07/03/2018) 120 mL 0  . promethazine-dextromethorphan (PROMETHAZINE-DM) 6.25-15 MG/5ML syrup Take 5 mLs by mouth at bedtime as needed for cough. (Patient not taking: Reported on 07/03/2018) 118 mL 0  . QUEtiapine (SEROQUEL) 25 MG tablet Take 1-2 tabs po QHS 60 tablet 2   No current facility-administered medications for this visit.     Medication Side  Effects: None  Allergies:  Allergies  Allergen Reactions  . Lyrica [Pregabalin] Other (See Comments)    Patient reports neurological problem. Speech disturbances and unable to control motor skills  . Gabapentin Swelling  . Nsaids Other (See Comments)    Avoids due to gastric bypass  . Penicillins Rash    Past Medical History:  Diagnosis Date  . Anemia   . Chronic back pain   . Complication of anesthesia    HARD TO WAKE UP/OXYGEN LEVEL DROPPED AFTER BACK SURGERY X1  . Constipation, chronic   . Depression   . Edema   . History of seasonal allergies    pt has inhalers for this if needed  . Hypotension   . Hypothyroid   . Insomnia   . Lymphedema of both lower extremities   . Migraine   . Restless leg syndrome   . Vitamin D deficiency     Family History  Problem Relation Age of Onset  . Pulmonary embolism Mother   . Depression Mother   . Asthma Mother   . Heart attack Father   . Cancer Father     Social History   Socioeconomic History  . Marital status: Married    Spouse name: Not on file  . Number of children: Not on file  . Years of education: Not on file  . Highest education level: Not on file  Occupational History  . Not on file  Social Needs  . Financial resource strain: Not on file  . Food insecurity    Worry: Not on file    Inability: Not on file  . Transportation needs    Medical: Not on file    Non-medical: Not on file  Tobacco Use  . Smoking status: Never Smoker  . Smokeless tobacco: Never Used  Substance and Sexual Activity  . Alcohol use: Yes    Alcohol/week: 0.0 standard drinks    Comment: Rare  . Drug use: No  . Sexual activity: Not on file  Lifestyle  . Physical activity    Days per week: Not on file    Minutes per session: Not on file  . Stress: Not on file  Relationships  . Social Musicianconnections    Talks on phone: Not on file    Gets together: Not on file    Attends religious service: Not on file    Active member of club or  organization: Not on file    Attends meetings of clubs or organizations: Not on file    Relationship status: Not on file  . Intimate partner violence    Fear of current or ex partner: Not on file    Emotionally abused: Not on file    Physically abused: Not on file    Forced sexual activity: Not on file  Other Topics Concern  . Not on file  Social History Narrative  . Not on file    Past Medical History, Surgical history, Social history, and Family history were reviewed  and updated as appropriate.   Please see review of systems for further details on the patient's review from today.   Objective:   Physical Exam:  There were no vitals taken for this visit.  Physical Exam Constitutional:      General: She is not in acute distress.    Appearance: She is well-developed.  Musculoskeletal:        General: No deformity.  Neurological:     Mental Status: She is alert and oriented to person, place, and time.     Coordination: Coordination normal.  Psychiatric:        Attention and Perception: Attention and perception normal. She does not perceive auditory or visual hallucinations.        Mood and Affect: Mood is anxious and depressed. Affect is tearful. Affect is not labile, blunt, angry or inappropriate.        Speech: Speech normal.        Behavior: Behavior normal.        Thought Content: Thought content normal. Thought content does not include homicidal or suicidal ideation. Thought content does not include homicidal or suicidal plan.        Cognition and Memory: Cognition and memory normal.        Judgment: Judgment normal.     Comments: Insight intact. No delusions.      Lab Review:     Component Value Date/Time   NA 136 02/23/2018 1645   NA 137 03/27/2016 1217   NA 139 12/03/2012 1136   K 3.7 02/23/2018 1645   K 4.0 12/03/2012 1136   CL 102 02/23/2018 1645   CL 105 12/03/2012 1136   CO2 24 02/23/2018 1645   CO2 26 12/03/2012 1136   GLUCOSE 121 (H) 02/23/2018 1645    GLUCOSE 77 12/03/2012 1136   BUN 13 02/23/2018 1645   BUN 8 03/27/2016 1217   BUN 16 12/03/2012 1136   CREATININE 0.72 02/23/2018 1645   CREATININE 0.46 (L) 12/03/2012 1136   CALCIUM 8.5 (L) 02/23/2018 1645   CALCIUM 9.0 12/03/2012 1136   PROT 6.3 03/27/2016 1217   ALBUMIN 4.0 03/27/2016 1217   AST 20 03/27/2016 1217   ALT 18 03/27/2016 1217   ALKPHOS 74 03/27/2016 1217   BILITOT 0.3 03/27/2016 1217   GFRNONAA >60 02/23/2018 1645   GFRNONAA >60 12/03/2012 1136   GFRAA >60 02/23/2018 1645   GFRAA >60 12/03/2012 1136       Component Value Date/Time   WBC 9.6 02/23/2018 1612   RBC 4.01 02/23/2018 1612   HGB 12.0 02/23/2018 1612   HGB 10.9 (L) 03/27/2016 1217   HCT 36.2 02/23/2018 1612   HCT 33.2 (L) 03/27/2016 1217   PLT 351 02/23/2018 1612   PLT 275 03/27/2016 1217   MCV 90.3 02/23/2018 1612   MCV 92 03/27/2016 1217   MCV 101 (H) 06/15/2013 1042   MCH 29.9 02/23/2018 1612   MCHC 33.1 02/23/2018 1612   RDW 17.7 (H) 02/23/2018 1612   RDW 14.3 03/27/2016 1217   RDW 14.5 06/15/2013 1042   LYMPHSABS 1.1 03/27/2016 1217   LYMPHSABS 3.5 06/15/2013 1042   MONOABS 0.9 06/15/2013 1042   EOSABS 0.1 03/27/2016 1217   EOSABS 0.1 06/15/2013 1042   BASOSABS 0.0 03/27/2016 1217   BASOSABS 0.1 06/15/2013 1042    No results found for: POCLITH, LITHIUM   No results found for: PHENYTOIN, PHENOBARB, VALPROATE, CBMZ   .res Assessment: Plan:   Case staffed with Dr. Clovis Pu. Discussed possible treatment  options and potential benefits, risks, and side effects with patient and her husband.  Discussed increasing Cymbalta to 90 mg daily to improve anxiety and depression since patient has had some improvement in the signs and symptoms with lower dose.  Discussed that higher dose may also be beneficial for her pain.  Patient agrees to increase in Cymbalta. Discussed using low-dose Seroquel to improve insomnia and acute anxiety. Discussed potential metabolic side effects associated with  atypical antipsychotics, as well as potential risk for movement side effects. Advised pt to contact office if movement side effects occur.  Will start Seroquel 25 mg 1-2 tabs p.o. nightly for insomnia and anxiety. Will continue alprazolam 1 mg 1/2-1 tab p.o. 3 times daily as needed anxiety. Discussed potential benefits of therapy and patient is amenable to starting therapy.  Patient assisted with scheduling therapy appointments with Elio Forgethris Andrews, LPC. Discussed safety plan and [facility] if [facility] are any acute safety issues.  Patient denies SI and fully contracts for safety. Patient advised to contact office with any questions, adverse effects, or acute worsening in signs and symptoms.  Michaela Jones was seen today for anxiety, depression and insomnia.  Diagnoses and all orders for this visit:  Adjustment disorder with mixed anxiety and depressed mood -     DULoxetine (CYMBALTA) 30 MG capsule; Take 1 capsule (30 mg total) by mouth daily. Take with a 60 mg capsule to equal total dose of 90 mg.  Generalized anxiety disorder -     ALPRAZolam (XANAX) 1 MG tablet; Take 1/2 -1 tab po TID prn anxiety -     QUEtiapine (SEROQUEL) 25 MG tablet; Take 1-2 tabs po QHS  Primary insomnia -     ALPRAZolam (XANAX) 1 MG tablet; Take 1/2 -1 tab po TID prn anxiety -     QUEtiapine (SEROQUEL) 25 MG tablet; Take 1-2 tabs po QHS       Please see After Visit Summary for patient specific instructions.  Future Appointments  Date Time Provider Department Center  02/03/2019  4:00 PM Waldron Sessionndrews, Christopher, [facility] None  02/10/2019  1:00 PM Waldron SessionAndrews, Christopher, [facility] None  02/11/2019  1:00 PM Corie Chiquitoarter, Vicky Schleich, PMHNP CP-CP None  02/17/2019  1:00 PM Waldron SessionAndrews, Christopher, [facility] None  02/24/2019  1:00 PM Waldron SessionAndrews, Christopher, [facility] None    No orders of the defined types were placed in this encounter.   -------------------------------

## 2019-01-26 ENCOUNTER — Ambulatory Visit: Payer: 59 | Admitting: Psychiatry

## 2019-02-03 ENCOUNTER — Ambulatory Visit: Payer: 59 | Admitting: Mental Health

## 2019-02-10 ENCOUNTER — Ambulatory Visit: Payer: 59 | Admitting: Mental Health

## 2019-02-11 ENCOUNTER — Ambulatory Visit: Payer: 59 | Admitting: Psychiatry

## 2019-02-16 ENCOUNTER — Other Ambulatory Visit: Payer: Self-pay | Admitting: Psychiatry

## 2019-02-16 DIAGNOSIS — F4323 Adjustment disorder with mixed anxiety and depressed mood: Secondary | ICD-10-CM

## 2019-02-17 ENCOUNTER — Ambulatory Visit: Payer: 59 | Admitting: Mental Health

## 2019-02-23 DIAGNOSIS — Z5181 Encounter for therapeutic drug level monitoring: Secondary | ICD-10-CM | POA: Diagnosis not present

## 2019-02-24 ENCOUNTER — Ambulatory Visit: Payer: 59 | Admitting: Mental Health

## 2019-03-23 ENCOUNTER — Other Ambulatory Visit: Payer: Self-pay | Admitting: Family Medicine

## 2019-03-24 DIAGNOSIS — Z5181 Encounter for therapeutic drug level monitoring: Secondary | ICD-10-CM | POA: Diagnosis not present

## 2019-03-30 ENCOUNTER — Other Ambulatory Visit: Payer: Self-pay | Admitting: Psychiatry

## 2019-03-30 DIAGNOSIS — F411 Generalized anxiety disorder: Secondary | ICD-10-CM

## 2019-03-30 DIAGNOSIS — F5101 Primary insomnia: Secondary | ICD-10-CM

## 2019-03-30 NOTE — Telephone Encounter (Signed)
Last visit 01/13/2019, hasn't had 4 week follow up

## 2019-04-01 ENCOUNTER — Other Ambulatory Visit: Payer: Self-pay | Admitting: Physician Assistant

## 2019-04-01 DIAGNOSIS — M545 Low back pain, unspecified: Secondary | ICD-10-CM

## 2019-04-01 NOTE — Telephone Encounter (Signed)
Refilled. She should be careful taking this medication with her other chronic medications.

## 2019-04-01 NOTE — Telephone Encounter (Signed)
Please review

## 2019-04-16 ENCOUNTER — Other Ambulatory Visit: Payer: Self-pay | Admitting: Psychiatry

## 2019-04-16 DIAGNOSIS — F5101 Primary insomnia: Secondary | ICD-10-CM

## 2019-04-16 DIAGNOSIS — F411 Generalized anxiety disorder: Secondary | ICD-10-CM

## 2019-04-29 ENCOUNTER — Other Ambulatory Visit: Payer: Self-pay | Admitting: Psychiatry

## 2019-04-29 DIAGNOSIS — F411 Generalized anxiety disorder: Secondary | ICD-10-CM

## 2019-04-29 DIAGNOSIS — F5101 Primary insomnia: Secondary | ICD-10-CM

## 2019-04-29 NOTE — Telephone Encounter (Signed)
Last appt 06/17 Nothing scheduled

## 2019-05-03 DIAGNOSIS — Z5181 Encounter for therapeutic drug level monitoring: Secondary | ICD-10-CM | POA: Diagnosis not present

## 2019-05-18 ENCOUNTER — Other Ambulatory Visit
Admission: RE | Admit: 2019-05-18 | Discharge: 2019-05-18 | Disposition: A | Discharge status: Home / Self Care | Payer: 59 | Source: Ambulatory Visit | Attending: Pain Medicine | Admitting: Pain Medicine

## 2019-05-18 DIAGNOSIS — Z5181 Encounter for therapeutic drug level monitoring: Secondary | ICD-10-CM | POA: Insufficient documentation

## 2019-05-18 LAB — URINE DRUG SCREEN, QUALITATIVE (ARMC ONLY)
Amphetamines, Ur Screen: NOT DETECTED
Barbiturates, Ur Screen: NOT DETECTED
Benzodiazepine, Ur Scrn: POSITIVE — AB
Cannabinoid 50 Ng, Ur ~~LOC~~: NOT DETECTED
Cocaine Metabolite,Ur ~~LOC~~: NOT DETECTED
MDMA (Ecstasy)Ur Screen: NOT DETECTED
Methadone Scn, Ur: NOT DETECTED
Opiate, Ur Screen: NOT DETECTED
Phencyclidine (PCP) Ur S: NOT DETECTED
Tricyclic, Ur Screen: NOT DETECTED

## 2019-05-19 ENCOUNTER — Other Ambulatory Visit: Payer: Self-pay | Admitting: Psychiatry

## 2019-05-19 DIAGNOSIS — F411 Generalized anxiety disorder: Secondary | ICD-10-CM

## 2019-05-19 DIAGNOSIS — F5101 Primary insomnia: Secondary | ICD-10-CM

## 2019-05-31 ENCOUNTER — Ambulatory Visit (INDEPENDENT_AMBULATORY_CARE_PROVIDER_SITE_OTHER): Payer: 59 | Admitting: Psychiatry

## 2019-05-31 ENCOUNTER — Encounter: Payer: Self-pay | Admitting: Psychiatry

## 2019-05-31 ENCOUNTER — Other Ambulatory Visit: Payer: Self-pay

## 2019-05-31 DIAGNOSIS — Z5181 Encounter for therapeutic drug level monitoring: Secondary | ICD-10-CM | POA: Diagnosis not present

## 2019-05-31 DIAGNOSIS — F4323 Adjustment disorder with mixed anxiety and depressed mood: Secondary | ICD-10-CM | POA: Diagnosis not present

## 2019-05-31 DIAGNOSIS — F411 Generalized anxiety disorder: Secondary | ICD-10-CM

## 2019-05-31 DIAGNOSIS — F5101 Primary insomnia: Secondary | ICD-10-CM | POA: Diagnosis not present

## 2019-05-31 MED ORDER — BREXPIPRAZOLE 1 MG PO TABS: 1.0000 mg | ORAL_TABLET | Freq: Every day | ORAL | 1 refills | Status: DC

## 2019-05-31 MED ORDER — QUETIAPINE FUMARATE 25 MG PO TABS: 25.0000 mg | ORAL_TABLET | Freq: Every day | ORAL | 1 refills | Status: DC

## 2019-05-31 MED ORDER — REXULTI 0.5 MG PO TABS: 0.5000 mg | ORAL_TABLET | Freq: Every day | ORAL | 0 refills | Status: DC

## 2019-05-31 MED ORDER — DULOXETINE HCL 60 MG PO CPEP: 60.0000 mg | ORAL_CAPSULE | Freq: Every day | ORAL | 0 refills | Status: DC

## 2019-05-31 MED ORDER — DULOXETINE HCL 30 MG PO CPEP: 90.0000 mg | ORAL_CAPSULE | Freq: Every day | ORAL | 0 refills | Status: DC

## 2019-05-31 MED ORDER — ARMODAFINIL 150 MG PO TABS: 150.0000 mg | ORAL_TABLET | ORAL | 2 refills | Status: DC

## 2019-05-31 MED ORDER — ALPRAZOLAM 1 MG PO TABS: ORAL_TABLET | ORAL | 1 refills | Status: DC

## 2019-05-31 NOTE — Progress Notes (Signed)
Michaela Jones [id number] [birth year] 43 y.o.  Subjective:   Patient ID:  Michaela Jones is a 43 y.o. (DOB [birth year]) female.  Chief Complaint:  Chief Complaint  Patient presents with  . Anxiety  . Depression    HPI Michaela Jones presents to the office today for follow-up of anxiety and depression. She reports increased anxiety and depression in response to situational stressors. "I think things are bothering me a lot more than they should." She reports lower frustration tolerance than usual. She reports low motivation for taking care of herself and instead is focusing on meeting the needs of family. Energy is low. Appetite fluctuates and has gone extended periods of time without eating and then overeating at times. Reports that she thinks Nuvigil is helpful for concentration but has not been taking it consistently. Reports that without taking it she will skip through things. Some diminished interest in things. Denies SI.   She is accompanied by her husband. Has been caring for infant nephew with health issues (pulmonary hypertension) and he was staying with them for an extended period of time, almost exclusively since June. She was staying up with infant at night from 1-6 am and then trying to nap during the daytime. Their children are in school remotely. Husband has had significant job stress. Her court date has been delayed again. 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   Has not noticed a significant change in depression or anxiety since increase in Cymbalta. Reports that pain and numbness in arm improved significantly around the time Cymbalta was increased.    Past Psychiatric Medication Trials: Cymbalta Sertraline Remeron Wellbutrin Buspar Nuvigil Provigil- increased activation Ritalin Concerta Adderall- Not as effective Strattera Klonopin Xanax Vistaril- Ineffective Ambien- Effective. Caused  parasomnias.  Lunesta Temazepam Trazodone-Ineffective Belsomra Doxepin Silenor Seroquel- wt gain Gabapentin- Stuttering Lyrica Trileptal- tingling in extremities  Review of Systems:  Review of Systems  Musculoskeletal: Positive for back pain. Negative for gait problem.  Neurological: Positive for headaches. Negative for tremors.       Reports improved neuropathy in arm with combination of Cymbalta and Keppra  Psychiatric/Behavioral:       Please refer to HPI    Medications: I have reviewed the patient's current medications.  Current Outpatient Medications  Medication Sig Dispense Refill  . albuterol (PROVENTIL HFA;VENTOLIN HFA) 108 (90 Base) MCG/ACT inhaler Inhale 2 puffs into the lungs every 6 (six) hours as needed for wheezing or shortness of breath.    Marland Kitchen. albuterol (PROVENTIL) (5 MG/ML) 0.5% nebulizer solution Inhale into the lungs.    . ALPRAZolam (XANAX) 1 MG tablet TAKE 1/2 TO 1 TABLET BY MOUTH THREE TIMES DAILY AS NEEDED ANXIETY 90 tablet 1  . celecoxib (CELEBREX) 200 MG capsule Take 200 mg by mouth as needed.     . cetirizine (ZYRTEC) 10 MG tablet Take 10 mg by mouth daily as needed for allergies.    . Cholecalciferol (VITAMIN D) 50 MCG (2000 UT) CAPS Take by mouth.    . cyanocobalamin (,VITAMIN B-12,) 1000 MCG/ML injection INJECT 1 MILLILITER (ML) ONCE MONTHLY 6 mL 4  . DULoxetine (CYMBALTA) 30 MG capsule Take 3 capsules (90 mg total) by mouth daily. 270 capsule 0  . levETIRAcetam (KEPPRA) 1000 MG tablet Take by mouth 2 (two) times daily.     Marland Kitchen. levothyroxine (SYNTHROID, LEVOTHROID) 112 MCG tablet TAKE 1 TABLET BY MOUTH ONCE DAILY 90 tablet 4  .  Multiple Vitamin (MULTIVITAMIN) tablet Take 1 tablet by mouth daily.    Marland Kitchen oxymorphone (OPANA) 10 MG tablet Take 5-10 mg by mouth 4 (four) times daily as needed.     . promethazine (PHENERGAN) 25 MG tablet TAKE 1/2 TO 1 TABLET BY MOUTH EVERY 6 HOURS AS NEEED FOR NAUSEA 10 tablet 6  . QUEtiapine (SEROQUEL) 25 MG tablet Take 1-2  tablets (25-50 mg total) by mouth at bedtime. 60 tablet 1  . Armodafinil 150 MG tablet Take 1 tablet (150 mg total) by mouth every morning. 30 tablet 2  . Brexpiprazole (REXULTI) 0.5 MG TABS Take 1 tablet (0.5 mg total) by mouth daily for 7 days. 7 tablet 0  . brexpiprazole (REXULTI) 1 MG TABS tablet Take 1 tablet (1 mg total) by mouth daily. 30 tablet 1  . cyclobenzaprine (FLEXERIL) 10 MG tablet Take 1 tablet (10 mg total) by mouth at bedtime. (Patient not taking: Reported on 05/31/2019) 30 tablet 0  . DULoxetine (CYMBALTA) 60 MG capsule Take 1 capsule (60 mg total) by mouth daily. 90 capsule 0  . fluticasone (FLONASE) 50 MCG/ACT nasal spray Place 2 sprays into both nostrils daily. (Patient not taking: Reported on 12/29/2018) 16 g 6  . naloxone (NARCAN) nasal spray 4 mg/0.1 mL Place into the nose.    . promethazine (PHENERGAN) 6.25 MG/5ML syrup Take 10 mLs (12.5 mg total) by mouth every 6 (six) hours as needed for nausea or vomiting. (Patient not taking: Reported on 07/03/2018) 120 mL 0  . promethazine-dextromethorphan (PROMETHAZINE-DM) 6.25-15 MG/5ML syrup Take 5 mLs by mouth at bedtime as needed for cough. (Patient not taking: Reported on 07/03/2018) 118 mL 0   No current facility-administered medications for this visit.     Medication Side Effects: None  Allergies:  Allergies  Allergen Reactions  . Lyrica [Pregabalin] Other (See Comments)    Patient reports neurological problem. Speech disturbances and unable to control motor skills  . Gabapentin Swelling  . Nsaids Other (See Comments)    Avoids due to gastric bypass  . Penicillins Rash    Past Medical History:  Diagnosis Date  . Anemia   . Chronic back pain   . Complication of anesthesia    HARD TO WAKE UP/OXYGEN LEVEL DROPPED AFTER BACK SURGERY X1  . Constipation, chronic   . Depression   . Edema   . History of seasonal allergies    pt has inhalers for this if needed  . Hypotension   . Hypothyroid   . Insomnia   . Lymphedema  of both lower extremities   . Migraine   . Restless leg syndrome   . Vitamin D deficiency     Family History  Problem Relation Age of Onset  . Pulmonary embolism Mother   . Depression Mother   . Asthma Mother   . Heart attack Father   . Cancer Father     Social History   Socioeconomic History  . Marital status: Married    Spouse name: Not on file  . Number of children: Not on file  . Years of education: Not on file  . Highest education level: Not on file  Occupational History  . Not on file  Social Needs  . Financial resource strain: Not on file  . Food insecurity    Worry: Not on file    Inability: Not on file  . Transportation needs    Medical: Not on file    Non-medical: Not on file  Tobacco Use  . Smoking  status: Never Smoker  . Smokeless tobacco: Never Used  Substance and Sexual Activity  . Alcohol use: Yes    Alcohol/week: 0.0 standard drinks    Comment: Rare  . Drug use: No  . Sexual activity: Not on file  Lifestyle  . Physical activity    Days per week: Not on file    Minutes per session: Not on file  . Stress: Not on file  Relationships  . Social Musician on phone: Not on file    Gets together: Not on file    Attends religious service: Not on file    Active member of club or organization: Not on file    Attends meetings of clubs or organizations: Not on file    Relationship status: Not on file  . Intimate partner violence    Fear of current or ex partner: Not on file    Emotionally abused: Not on file    Physically abused: Not on file    Forced sexual activity: Not on file  Other Topics Concern  . Not on file  Social History Narrative  . Not on file    Past Medical History, Surgical history, Social history, and Family history were reviewed and updated as appropriate.   Please see review of systems for further details on the patient's review from today.   Objective:   Physical Exam:  BP 137/90   Pulse 91   Physical  Exam Constitutional:      General: She is not in acute distress.    Appearance: She is well-developed.  Musculoskeletal:        General: No deformity.  Neurological:     Mental Status: She is alert and oriented to person, place, and time.     Coordination: Coordination normal.  Psychiatric:        Attention and Perception: Attention and perception normal. She does not perceive auditory or visual hallucinations.        Mood and Affect: Mood is anxious and depressed. Affect is not labile, blunt, angry or inappropriate.        Speech: Speech normal.        Behavior: Behavior normal.        Thought Content: Thought content normal. Thought content is not paranoid or delusional. Thought content does not include homicidal or suicidal ideation. Thought content does not include homicidal or suicidal plan.        Cognition and Memory: Cognition and memory normal.        Judgment: Judgment normal.     Comments: Insight intact. No delusions.      Lab Review:     Component Value Date/Time   NA 136 02/23/2018 1645   NA 137 03/27/2016 1217   NA 139 12/03/2012 1136   K 3.7 02/23/2018 1645   K 4.0 12/03/2012 1136   CL 102 02/23/2018 1645   CL 105 12/03/2012 1136   CO2 24 02/23/2018 1645   CO2 26 12/03/2012 1136   GLUCOSE 121 (H) 02/23/2018 1645   GLUCOSE 77 12/03/2012 1136   BUN 13 02/23/2018 1645   BUN 8 03/27/2016 1217   BUN 16 12/03/2012 1136   CREATININE 0.72 02/23/2018 1645   CREATININE 0.46 (L) 12/03/2012 1136   CALCIUM 8.5 (L) 02/23/2018 1645   CALCIUM 9.0 12/03/2012 1136   PROT 6.3 03/27/2016 1217   ALBUMIN 4.0 03/27/2016 1217   AST 20 03/27/2016 1217   ALT 18 03/27/2016 1217   ALKPHOS 74 03/27/2016 1217  BILITOT 0.3 03/27/2016 1217   GFRNONAA >60 02/23/2018 1645   GFRNONAA >60 12/03/2012 1136   GFRAA >60 02/23/2018 1645   GFRAA >60 12/03/2012 1136       Component Value Date/Time   WBC 9.6 02/23/2018 1612   RBC 4.01 02/23/2018 1612   HGB 12.0 02/23/2018 1612   HGB  10.9 (L) 03/27/2016 1217   HCT 36.2 02/23/2018 1612   HCT 33.2 (L) 03/27/2016 1217   PLT 351 02/23/2018 1612   PLT 275 03/27/2016 1217   MCV 90.3 02/23/2018 1612   MCV 92 03/27/2016 1217   MCV 101 (H) 06/15/2013 1042   MCH 29.9 02/23/2018 1612   MCHC 33.1 02/23/2018 1612   RDW 17.7 (H) 02/23/2018 1612   RDW 14.3 03/27/2016 1217   RDW 14.5 06/15/2013 1042   LYMPHSABS 1.1 03/27/2016 1217   LYMPHSABS 3.5 06/15/2013 1042   MONOABS 0.9 06/15/2013 1042   EOSABS 0.1 03/27/2016 1217   EOSABS 0.1 06/15/2013 1042   BASOSABS 0.0 03/27/2016 1217   BASOSABS 0.1 06/15/2013 1042    No results found for: POCLITH, LITHIUM   No results found for: PHENYTOIN, PHENOBARB, VALPROATE, CBMZ   .res Assessment: Plan:   Discussed potential benefits, risks, and side effects of Rexulti for short-term, immediate improvement of acute depressive signs and symptoms. Discussed potential metabolic side effects associated with atypical antipsychotics, as well as potential risk for movement side effects. Advised pt to contact office if movement side effects occur.  Will start Rexulti 0.5 mg p.o. daily for 1 week and then increase to 1 mg daily for augmentation of depression.  Discussed that [place] may also be helpful for anxiety. Will continue Cymbalta 90 mg daily for anxiety and depression.  Will continue at the 90 mg dose since patient reports that this dose seems to have been more effective for her pain signs and symptoms and she is not experiencing any tolerability issues at this dose. Continue Xanax as needed for anxiety. Will continue Seroquel 25 mg 1-2 tabs p.o. nightly as needed for severe insomnia/anxiety. Patient to follow-up in 6 weeks or sooner if clinically indicated.  Genea was seen today for anxiety and depression.  Diagnoses and all orders for this visit:  Generalized anxiety disorder -     ALPRAZolam (XANAX) 1 MG tablet; TAKE 1/2 TO 1 TABLET BY MOUTH THREE TIMES DAILY AS NEEDED ANXIETY -      DULoxetine (CYMBALTA) 60 MG capsule; Take 1 capsule (60 mg total) by mouth daily. -     QUEtiapine (SEROQUEL) 25 MG tablet; Take 1-2 tablets (25-50 mg total) by mouth at bedtime.  Primary insomnia -     ALPRAZolam (XANAX) 1 MG tablet; TAKE 1/2 TO 1 TABLET BY MOUTH THREE TIMES DAILY AS NEEDED ANXIETY -     QUEtiapine (SEROQUEL) 25 MG tablet; Take 1-2 tablets (25-50 mg total) by mouth at bedtime.  Adjustment disorder with mixed anxiety and depressed mood -     Brexpiprazole (REXULTI) 0.5 MG TABS; Take 1 tablet (0.5 mg total) by mouth daily for 7 days. -     brexpiprazole (REXULTI) 1 MG TABS tablet; Take 1 tablet (1 mg total) by mouth daily. -     DULoxetine (CYMBALTA) 30 MG capsule; Take 3 capsules (90 mg total) by mouth daily. -     Armodafinil 150 MG tablet; Take 1 tablet (150 mg total) by mouth every morning.     Please see After Visit Summary for patient specific instructions.  Future Appointments  Date  Time Provider Department Center  07/15/2019  9:00 AM Corie Chiquito, PMHNP [facility] None    No orders of the defined types were placed in this encounter.   -------------------------------

## 2019-06-28 DIAGNOSIS — Z5181 Encounter for therapeutic drug level monitoring: Secondary | ICD-10-CM | POA: Diagnosis not present

## 2019-07-15 ENCOUNTER — Ambulatory Visit: Payer: 59 | Admitting: Psychiatry

## 2019-07-19 ENCOUNTER — Other Ambulatory Visit
Admission: RE | Admit: 2019-07-19 | Discharge: 2019-07-19 | Disposition: A | Discharge status: Home / Self Care | Payer: 59 | Source: Ambulatory Visit | Attending: Pain Medicine | Admitting: Pain Medicine

## 2019-07-19 DIAGNOSIS — M5417 Radiculopathy, lumbosacral region: Secondary | ICD-10-CM | POA: Diagnosis not present

## 2019-07-19 DIAGNOSIS — M961 Postlaminectomy syndrome, not elsewhere classified: Secondary | ICD-10-CM | POA: Diagnosis not present

## 2019-07-19 DIAGNOSIS — Z5181 Encounter for therapeutic drug level monitoring: Secondary | ICD-10-CM | POA: Insufficient documentation

## 2019-07-19 DIAGNOSIS — Z79891 Long term (current) use of opiate analgesic: Secondary | ICD-10-CM | POA: Diagnosis not present

## 2019-07-19 LAB — URINE DRUG SCREEN, QUALITATIVE (ARMC ONLY)
Amphetamines, Ur Screen: NOT DETECTED
Barbiturates, Ur Screen: NOT DETECTED
Benzodiazepine, Ur Scrn: POSITIVE — AB
Cannabinoid 50 Ng, Ur ~~LOC~~: NOT DETECTED
Cocaine Metabolite,Ur ~~LOC~~: NOT DETECTED
MDMA (Ecstasy)Ur Screen: NOT DETECTED
Methadone Scn, Ur: NOT DETECTED
Opiate, Ur Screen: POSITIVE — AB
Phencyclidine (PCP) Ur S: NOT DETECTED
Tricyclic, Ur Screen: NOT DETECTED

## 2019-07-22 ENCOUNTER — Other Ambulatory Visit: Payer: Self-pay | Admitting: Psychiatry

## 2019-07-22 DIAGNOSIS — F5101 Primary insomnia: Secondary | ICD-10-CM

## 2019-07-22 DIAGNOSIS — F411 Generalized anxiety disorder: Secondary | ICD-10-CM

## 2019-07-26 NOTE — Telephone Encounter (Signed)
Okay to call in. 

## 2019-09-08 ENCOUNTER — Other Ambulatory Visit: Payer: Self-pay | Admitting: Psychiatry

## 2019-09-08 DIAGNOSIS — F411 Generalized anxiety disorder: Secondary | ICD-10-CM

## 2019-09-08 DIAGNOSIS — F5101 Primary insomnia: Secondary | ICD-10-CM

## 2019-09-08 DIAGNOSIS — F4323 Adjustment disorder with mixed anxiety and depressed mood: Secondary | ICD-10-CM

## 2019-09-13 ENCOUNTER — Other Ambulatory Visit: Payer: Self-pay | Admitting: Psychiatry

## 2019-09-13 DIAGNOSIS — F411 Generalized anxiety disorder: Secondary | ICD-10-CM

## 2019-09-13 DIAGNOSIS — F5101 Primary insomnia: Secondary | ICD-10-CM

## 2019-09-13 NOTE — Telephone Encounter (Signed)
Next apt 10/06/2019 

## 2019-09-30 ENCOUNTER — Other Ambulatory Visit: Payer: Self-pay

## 2019-09-30 ENCOUNTER — Ambulatory Visit: Payer: 59 | Attending: Internal Medicine

## 2019-09-30 DIAGNOSIS — Z23 Encounter for immunization: Secondary | ICD-10-CM | POA: Insufficient documentation

## 2019-09-30 NOTE — Progress Notes (Signed)
   [facility] Vaccination [facility]  Name:  Michaela Jones    MRN: [id number] DOB: 1976/06/23  09/30/2019  Michaela Jones was observed post Covid-19 immunization for 15 minutes without incident. She was provided with Vaccine Information Sheet and instruction to access the V-Safe system.   Michaela Jones was instructed to call 911 with any severe reactions post vaccine: Marland Kitchen Difficulty breathing  . Swelling of face and throat  . A fast heartbeat  . A bad rash all over body  . Dizziness and weakness   Immunizations Administered    Name Date Dose VIS Date Route   Pfizer COVID-19 Vaccine 09/30/2019 11:41 AM 0.3 mL 07/09/2019 Intramuscular   Manufacturer: [employer], [employer]   Lot: [id number]   NDC: [address]-9

## 2019-10-06 ENCOUNTER — Other Ambulatory Visit: Payer: Self-pay

## 2019-10-06 ENCOUNTER — Ambulatory Visit (INDEPENDENT_AMBULATORY_CARE_PROVIDER_SITE_OTHER): Payer: 59 | Admitting: Psychiatry

## 2019-10-06 ENCOUNTER — Encounter: Payer: Self-pay | Admitting: Psychiatry

## 2019-10-06 DIAGNOSIS — F411 Generalized anxiety disorder: Secondary | ICD-10-CM | POA: Diagnosis not present

## 2019-10-06 DIAGNOSIS — F5101 Primary insomnia: Secondary | ICD-10-CM

## 2019-10-06 DIAGNOSIS — F4323 Adjustment disorder with mixed anxiety and depressed mood: Secondary | ICD-10-CM | POA: Diagnosis not present

## 2019-10-06 MED ORDER — ALPRAZOLAM 1 MG PO TABS: ORAL_TABLET | ORAL | 2 refills | Status: DC

## 2019-10-06 MED ORDER — DULOXETINE HCL 30 MG PO CPEP: 90.0000 mg | ORAL_CAPSULE | Freq: Every day | ORAL | 1 refills | Status: DC

## 2019-10-06 MED ORDER — ARMODAFINIL 150 MG PO TABS: 150.0000 mg | ORAL_TABLET | ORAL | 2 refills | Status: DC

## 2019-10-06 MED ORDER — QUETIAPINE FUMARATE ER 50 MG PO TB24: ORAL_TABLET | ORAL | 0 refills | Status: DC

## 2019-10-06 MED ORDER — DAYVIGO 5 MG PO TABS: 5.0000 mg | ORAL_TABLET | Freq: Every day | ORAL | 0 refills | Status: DC

## 2019-10-06 MED ORDER — BREXPIPRAZOLE 1 MG PO TABS: 1.0000 mg | ORAL_TABLET | Freq: Every day | ORAL | 2 refills | Status: DC

## 2019-10-06 NOTE — Progress Notes (Signed)
ENVY MENO [id number] Oct 03, 1975 44 y.o.  Subjective:   Patient ID:  Michaela Jones is a 44 y.o. (DOB 1976/01/19) female.  Chief Complaint:  Chief Complaint  Patient presents with  . Follow-up    Anxiety, ADD, Depression    HPI Michaela Jones presents to the office today for follow-up of anxiety and depression. She reporst that anxiety has been manageable. Mood has been "about the same." She reports that she has started doing more things and getting out more. Xanax is helpful for sleep initiation. Typically awakens a few hours later. Sleeps about 4 hours total. Denies nightmares and vivid dreams. Denies nightmares or vivid dreams. She reports that she is more focused and motivated when she takes Michaela Jones. Appetite has been ok. Denies  SI.   Cousin moved in for the next 13 weeks while working here temporarily. Long-term friend moved out of their house and moved to [place].   Considering starting classes in May.    Past Psychiatric Medication Trials: Cymbalta Sertraline Remeron Wellbutrin Buspar Nuvigil Provigil- increased activation Ritalin Concerta Adderall- Not as effective Strattera Klonopin Xanax Vistaril- Ineffective Ambien- Effective. Caused parasomnias.  Lunesta Temazepam Trazodone-Ineffective Belsomra Doxepin Silenor Seroquel- wt gain Gabapentin- Stuttering Lyrica Trileptal- tingling in extremities  Review of Systems:  Review of Systems  Musculoskeletal: Positive for back pain. Negative for gait problem.  Neurological: Positive for headaches. Negative for tremors.       Reports improved neuropathy in arm with combination of Cymbalta and Keppra  Psychiatric/Behavioral:       Please refer to HPI    Medications: I have reviewed the patient's current medications.  Current Outpatient Medications  Medication Sig Dispense Refill  . albuterol (PROVENTIL HFA;VENTOLIN HFA) 108 (90 Base) MCG/ACT inhaler Inhale 2 puffs into the lungs every 6 (six) hours as needed for  wheezing or shortness of breath.    Marland Kitchen albuterol (PROVENTIL) (5 MG/ML) 0.5% nebulizer solution Inhale into the lungs.    Melene Muller ON 11/09/2019] ALPRAZolam (XANAX) 1 MG tablet TAKE 1/2 TO 1 TABLET BY MOUTH THREE TIMES DAILY AS NEEDED ANXIETY 90 tablet 2  . brexpiprazole (REXULTI) 1 MG TABS tablet Take 1 tablet (1 mg total) by mouth daily. 30 tablet 2  . celecoxib (CELEBREX) 200 MG capsule Take 200 mg by mouth as needed.     . cetirizine (ZYRTEC) 10 MG tablet Take 10 mg by mouth daily as needed for allergies.    . cyanocobalamin (,VITAMIN B-12,) 1000 MCG/ML injection INJECT 1 MILLILITER (ML) ONCE MONTHLY 6 mL 4  . DULoxetine (CYMBALTA) 30 MG capsule Take 3 capsules (90 mg total) by mouth daily. 270 capsule 1  . levETIRAcetam (KEPPRA) 1000 MG tablet Take by mouth 2 (two) times daily.     Marland Kitchen levothyroxine (SYNTHROID, LEVOTHROID) 112 MCG tablet TAKE 1 TABLET BY MOUTH ONCE DAILY 90 tablet 4  . meloxicam (MOBIC) 15 MG tablet Take by mouth.    . Multiple Vitamin (MULTIVITAMIN) tablet Take 1 tablet by mouth daily.    Marland Kitchen oxymorphone (OPANA) 10 MG tablet Take 5-10 mg by mouth 4 (four) times daily as needed.     . Armodafinil 150 MG tablet Take 1 tablet (150 mg total) by mouth every morning. 30 tablet 2  . Cholecalciferol (VITAMIN D) 50 MCG (2000 UT) CAPS Take by mouth.    . cyclobenzaprine (FLEXERIL) 10 MG tablet Take 1 tablet (10 mg total) by mouth at bedtime. (Patient not taking: Reported on 05/31/2019) 30 tablet 0  . fluticasone (FLONASE)  50 MCG/ACT nasal spray Place 2 sprays into both nostrils daily. (Patient not taking: Reported on 12/29/2018) 16 g 6  . Lemborexant (DAYVIGO) 5 MG TABS Take 5 mg by mouth at bedtime. May increase to 10 mg po QHS 10 tablet 0  . naloxone (NARCAN) nasal spray 4 mg/0.1 mL Place into the nose.    . promethazine (PHENERGAN) 25 MG tablet TAKE 1/2 TO 1 TABLET BY MOUTH EVERY 6 HOURS AS NEEED FOR NAUSEA (Patient not taking: Reported on 10/06/2019) 10 tablet 6  . promethazine (PHENERGAN)  6.25 MG/5ML syrup Take 10 mLs (12.5 mg total) by mouth every 6 (six) hours as needed for nausea or vomiting. (Patient not taking: Reported on 07/03/2018) 120 mL 0  . promethazine-dextromethorphan (PROMETHAZINE-DM) 6.25-15 MG/5ML syrup Take 5 mLs by mouth at bedtime as needed for cough. (Patient not taking: Reported on 07/03/2018) 118 mL 0  . QUEtiapine (SEROQUEL XR) 50 MG TB24 24 hr tablet Take 1 tab every evening on an empty stomach 30 tablet 0   No current facility-administered medications for this visit.    Medication Side Effects: None  Allergies:  Allergies  Allergen Reactions  . Lyrica [Pregabalin] Other (See Comments)    Patient reports neurological problem. Speech disturbances and unable to control motor skills  . Gabapentin Swelling  . Nsaids Other (See Comments)    Avoids due to gastric bypass  . Penicillins Rash    Past Medical History:  Diagnosis Date  . Anemia   . Chronic back pain   . Complication of anesthesia    HARD TO WAKE UP/OXYGEN LEVEL DROPPED AFTER BACK SURGERY X1  . Constipation, chronic   . Depression   . Edema   . History of seasonal allergies    pt has inhalers for this if needed  . Hypotension   . Hypothyroid   . Insomnia   . Lymphedema of both lower extremities   . Migraine   . Restless leg syndrome   . Vitamin D deficiency     Family History  Problem Relation Age of Onset  . Pulmonary embolism Mother   . Depression Mother   . Asthma Mother   . Heart attack Father   . Cancer Father     Social History   Socioeconomic History  . Marital status: Married    Spouse name: Not on file  . Number of children: Not on file  . Years of education: Not on file  . Highest education level: Not on file  Occupational History  . Not on file  Tobacco Use  . Smoking status: Never Smoker  . Smokeless tobacco: Never Used  Substance and Sexual Activity  . Alcohol use: Yes    Alcohol/week: 0.0 standard drinks    Comment: Rare  . Drug use: No  .  Sexual activity: Not on file  Other Topics Concern  . Not on file  Social History Narrative  . Not on file   Social Determinants of Health   Financial Resource Strain:   . Difficulty of Paying Living Expenses: Not on file  Food Insecurity:   . Worried About Programme researcher, broadcasting/film/video in the Last Year: Not on file  . Ran Out of Food in the Last Year: Not on file  Transportation Needs:   . Lack of Transportation (Medical): Not on file  . Lack of Transportation (Non-Medical): Not on file  Physical Activity:   . Days of Exercise per Week: Not on file  . Minutes of Exercise per Session:  Not on file  Stress:   . Feeling of Stress : Not on file  Social Connections:   . Frequency of Communication with Friends and Family: Not on file  . Frequency of Social Gatherings with Friends and Family: Not on file  . Attends Religious Services: Not on file  . Active Member of Clubs or Organizations: Not on file  . Attends Archivist Meetings: Not on file  . Marital Status: Not on file  Intimate Partner Violence:   . Fear of Current or Ex-Partner: Not on file  . Emotionally Abused: Not on file  . Physically Abused: Not on file  . Sexually Abused: Not on file    Past Medical History, Surgical history, Social history, and Family history were reviewed and updated as appropriate.   Please see review of systems for further details on the patient's review from today.   Objective:   Physical Exam:  BP 129/84   Pulse 77   Physical Exam Constitutional:      General: She is not in acute distress. Musculoskeletal:        General: No deformity.  Neurological:     Mental Status: She is alert and oriented to person, place, and time.     Coordination: Coordination normal.  Psychiatric:        Attention and Perception: Attention and perception normal. She does not perceive auditory or visual hallucinations.        Mood and Affect: Mood normal. Mood is not anxious or depressed. Affect is not  labile, blunt, angry or inappropriate.        Speech: Speech normal.        Behavior: Behavior normal.        Thought Content: Thought content normal. Thought content is not paranoid or delusional. Thought content does not include homicidal or suicidal ideation. Thought content does not include homicidal or suicidal plan.        Cognition and Memory: Cognition and memory normal.        Judgment: Judgment normal.     Comments: Insight intact      Lab Review:     Component Value Date/Time   NA 136 02/23/2018 1645   NA 137 03/27/2016 1217   NA 139 12/03/2012 1136   K 3.7 02/23/2018 1645   K 4.0 12/03/2012 1136   CL 102 02/23/2018 1645   CL 105 12/03/2012 1136   CO2 24 02/23/2018 1645   CO2 26 12/03/2012 1136   GLUCOSE 121 (H) 02/23/2018 1645   GLUCOSE 77 12/03/2012 1136   BUN 13 02/23/2018 1645   BUN 8 03/27/2016 1217   BUN 16 12/03/2012 1136   CREATININE 0.72 02/23/2018 1645   CREATININE 0.46 (L) 12/03/2012 1136   CALCIUM 8.5 (L) 02/23/2018 1645   CALCIUM 9.0 12/03/2012 1136   PROT 6.3 03/27/2016 1217   ALBUMIN 4.0 03/27/2016 1217   AST 20 03/27/2016 1217   ALT 18 03/27/2016 1217   ALKPHOS 74 03/27/2016 1217   BILITOT 0.3 03/27/2016 1217   GFRNONAA >60 02/23/2018 1645   GFRNONAA >60 12/03/2012 1136   GFRAA >60 02/23/2018 1645   GFRAA >60 12/03/2012 1136       Component Value Date/Time   WBC 9.6 02/23/2018 1612   RBC 4.01 02/23/2018 1612   HGB 12.0 02/23/2018 1612   HGB 10.9 (L) 03/27/2016 1217   HCT 36.2 02/23/2018 1612   HCT 33.2 (L) 03/27/2016 1217   PLT 351 02/23/2018 1612   PLT 275 03/27/2016  1217   MCV 90.3 02/23/2018 1612   MCV 92 03/27/2016 1217   MCV 101 (H) 06/15/2013 1042   MCH 29.9 02/23/2018 1612   MCHC 33.1 02/23/2018 1612   RDW 17.7 (H) 02/23/2018 1612   RDW 14.3 03/27/2016 1217   RDW 14.5 06/15/2013 1042   LYMPHSABS 1.1 03/27/2016 1217   LYMPHSABS 3.5 06/15/2013 1042   MONOABS 0.9 06/15/2013 1042   EOSABS 0.1 03/27/2016 1217   EOSABS 0.1  06/15/2013 1042   BASOSABS 0.0 03/27/2016 1217   BASOSABS 0.1 06/15/2013 1042    No results found for: POCLITH, LITHIUM   No results found for: PHENYTOIN, PHENOBARB, VALPROATE, CBMZ   .res Assessment: Plan:   Discussed treatment options for insomnia to include potential benefits, risks, and side effects of Dayvigo.  Will start Dayvigo 5 mg po nightly for 3-5 nights, then may increase to 10 g.  At bedtime and insomnia.  We will also send prescription for Seroquel asked for 50 mg for patient to take 4 hours before bedtime to possibly improve sleep duration since Seroquel immediate release has been helpful for sleep initiation but not for sleep maintenance. Continue Rexulti 1 mg daily for augmentation of depression. Continue Cymbalta 90 mg daily for anxiety depression. Continue Nuvigil 150 mg daily for concentration and focus. Patient follow-up in 3 months or sooner if clinically indicated. Patient advised to contact office with any questions, adverse effects, or acute worsening in signs and symptoms.  Michaela Jones was seen today for follow-up.  Diagnoses and all orders for this visit:  Adjustment disorder with mixed anxiety and depressed mood -     Armodafinil 150 MG tablet; Take 1 tablet (150 mg total) by mouth every morning. -     DULoxetine (CYMBALTA) 30 MG capsule; Take 3 capsules (90 mg total) by mouth daily. -     brexpiprazole (REXULTI) 1 MG TABS tablet; Take 1 tablet (1 mg total) by mouth daily.  Generalized anxiety disorder -     ALPRAZolam (XANAX) 1 MG tablet; TAKE 1/2 TO 1 TABLET BY MOUTH THREE TIMES DAILY AS NEEDED ANXIETY  Primary insomnia -     Lemborexant (DAYVIGO) 5 MG TABS; Take 5 mg by mouth at bedtime. May increase to 10 mg po QHS -     QUEtiapine (SEROQUEL XR) 50 MG TB24 24 hr tablet; Take 1 tab every evening on an empty stomach -     ALPRAZolam (XANAX) 1 MG tablet; TAKE 1/2 TO 1 TABLET BY MOUTH THREE TIMES DAILY AS NEEDED ANXIETY     Please see After Visit Summary for  patient specific instructions.  Future Appointments  Date Time Provider Department Center  10/21/2019 11:30 AM [facility]  01/13/2020  9:00 AM Corie Chiquito, PMHNP CP-CP None    No orders of the defined types were placed in this encounter.   -------------------------------

## 2019-10-11 DIAGNOSIS — Z79891 Long term (current) use of opiate analgesic: Secondary | ICD-10-CM | POA: Diagnosis not present

## 2019-10-11 DIAGNOSIS — M5417 Radiculopathy, lumbosacral region: Secondary | ICD-10-CM | POA: Diagnosis not present

## 2019-10-11 DIAGNOSIS — M961 Postlaminectomy syndrome, not elsewhere classified: Secondary | ICD-10-CM | POA: Diagnosis not present

## 2019-10-11 DIAGNOSIS — Z5181 Encounter for therapeutic drug level monitoring: Secondary | ICD-10-CM | POA: Diagnosis not present

## 2019-10-13 ENCOUNTER — Telehealth: Payer: Self-pay | Admitting: Psychiatry

## 2019-10-13 NOTE — Telephone Encounter (Signed)
Pt would like a refill on Dayvigo 10mg  sent in at the outpt pharmacy [facility].

## 2019-10-14 ENCOUNTER — Other Ambulatory Visit: Payer: Self-pay

## 2019-10-14 DIAGNOSIS — F5101 Primary insomnia: Secondary | ICD-10-CM

## 2019-10-14 MED ORDER — DAYVIGO 10 MG PO TABS: 10.0000 mg | ORAL_TABLET | Freq: Every day | ORAL | 2 refills | Status: DC

## 2019-10-14 NOTE — Telephone Encounter (Signed)
Rx for Dayvigo 10 mg 1 q hs pended for Shanda Bumps to submit to [facility]

## 2019-10-20 ENCOUNTER — Other Ambulatory Visit: Payer: Self-pay | Admitting: Family Medicine

## 2019-10-20 NOTE — Telephone Encounter (Signed)
Requested medication (s) are due for refill today -yes  Requested medication (s) are on the active medication list -yes  Future visit scheduled -no  Last refill: 10/26/18 4 RF  Notes to clinic: Patient requesting refill of medication not on protocol.  Requested Prescriptions  Pending Prescriptions Disp Refills   cyanocobalamin (,VITAMIN B-12,) 1000 MCG/ML injection [Pharmacy Med Name: CYANOCOBALAMIN 1000 MCG/ML 1000 Solution] 4 mL 4    Sig: INJECT 1 MILLILITER (ML) ONCE MONTHLY      Off-Protocol Failed - 10/20/2019  1:59 PM      Failed - Medication not assigned to a protocol, review manually.      Passed - Valid encounter within last 12 months    Recent Outpatient Visits           11 months ago Bacterial conjunctivitis   [facility] Osvaldo Angst M, [place]   1 year ago Upper respiratory tract infection, unspecified type   [facility] [place], Lavella Hammock, [place]   1 year ago Annual physical exam   [facility] Osvaldo Angst M, PA-C   1 year ago Nausea and vomiting, intractability of vomiting not specified, unspecified vomiting type   [facility] [place], Ricki Rodriguez M, [place]   2 years ago URI with cough and congestion   [facility] Osvaldo Angst M, [place]             Off-Protocol Failed - 10/20/2019  1:59 PM      Failed - Medication not assigned to a protocol, review manually.      Passed - Valid encounter within last 12 months    Recent Outpatient Visits           11 months ago Bacterial conjunctivitis   [facility] Osvaldo Angst M, [place]   1 year ago Upper respiratory tract infection, unspecified type   [facility] [place], Lavella Hammock, [place]   1 year ago Annual physical exam   [facility] Osvaldo Angst M, PA-C   1 year ago Nausea and vomiting, intractability of vomiting not specified, unspecified vomiting type   [facility] [place], Ricki Rodriguez  M, [place]   2 years ago URI with cough and congestion   [facility] [place], [place] M, [place]                  Requested Prescriptions  Pending Prescriptions Disp Refills   cyanocobalamin (,VITAMIN B-12,) 1000 MCG/ML injection [Pharmacy Med Name: CYANOCOBALAMIN 1000 MCG/ML 1000 Solution] 4 mL 4    Sig: INJECT 1 MILLILITER (ML) ONCE MONTHLY      Off-Protocol Failed - 10/20/2019  1:59 PM      Failed - Medication not assigned to a protocol, review manually.      Passed - Valid encounter within last 12 months    Recent Outpatient Visits           11 months ago Bacterial conjunctivitis   [facility] Osvaldo Angst M, [place]   1 year ago Upper respiratory tract infection, unspecified type   [facility] [place], Lavella Hammock, [place]   1 year ago Annual physical exam   [facility] Osvaldo Angst M, [place]   1 year ago Nausea and vomiting, intractability of vomiting not specified, unspecified vomiting type   [facility] [place], Ricki Rodriguez M, [place]   2 years ago URI with cough and congestion   [facility], Ricki Rodriguez [place], [place]  Off-Protocol Failed - 10/20/2019  1:59 PM      Failed - Medication not assigned to a protocol, review manually.      Passed - Valid encounter within last 12 months    Recent Outpatient Visits           11 months ago Bacterial conjunctivitis   [facility] [place], [place], [place]   1 year ago Upper respiratory tract infection, unspecified type   [facility] [place], Wendee Beavers, [place]   1 year ago Annual physical exam   [facility] Carles Collet M, [place]   1 year ago Nausea and vomiting, intractability of vomiting not specified, unspecified vomiting type   [facility] [place], Fabio Bering M, [place]   2 years ago URI with cough and congestion   [facility] [place], Fabio Bering [place], [place]

## 2019-10-21 ENCOUNTER — Ambulatory Visit: Payer: 59

## 2019-10-23 ENCOUNTER — Ambulatory Visit: Payer: 59 | Attending: Internal Medicine

## 2019-10-23 DIAGNOSIS — Z23 Encounter for immunization: Secondary | ICD-10-CM

## 2019-10-23 NOTE — Progress Notes (Signed)
   Covid-19 Vaccination Clinic  Name:  Michaela Jones    MRN: [id number] DOB: 30-Dec-1975  10/23/2019  [name] was observed post Covid-19 immunization for 15 minutes without incident. She was provided with Vaccine Information Sheet and instruction to access the V-Safe system.   [name] was instructed to call 911 with any severe reactions post vaccine: Marland Kitchen Difficulty breathing  . Swelling of face and throat  . A fast heartbeat  . A bad rash all over body  . Dizziness and weakness   Immunizations Administered    Name Date Dose VIS Date Route   Pfizer COVID-19 Vaccine 10/23/2019  6:42 PM 0.3 mL 07/09/2019 Intramuscular   Manufacturer: [employer], [employer]   Lot: [id number]   NDC: [address]-4

## 2019-10-26 ENCOUNTER — Ambulatory Visit: Payer: 59

## 2020-01-04 DIAGNOSIS — M961 Postlaminectomy syndrome, not elsewhere classified: Secondary | ICD-10-CM | POA: Diagnosis not present

## 2020-01-04 DIAGNOSIS — Z5181 Encounter for therapeutic drug level monitoring: Secondary | ICD-10-CM | POA: Diagnosis not present

## 2020-01-04 DIAGNOSIS — M5417 Radiculopathy, lumbosacral region: Secondary | ICD-10-CM | POA: Diagnosis not present

## 2020-01-04 DIAGNOSIS — G894 Chronic pain syndrome: Secondary | ICD-10-CM | POA: Diagnosis not present

## 2020-01-13 ENCOUNTER — Ambulatory Visit: Payer: 59 | Admitting: Psychiatry

## 2020-02-03 ENCOUNTER — Other Ambulatory Visit: Payer: Self-pay | Admitting: Psychiatry

## 2020-02-17 ENCOUNTER — Ambulatory Visit (INDEPENDENT_AMBULATORY_CARE_PROVIDER_SITE_OTHER): Payer: 59 | Admitting: Psychiatry

## 2020-02-17 ENCOUNTER — Encounter: Payer: Self-pay | Admitting: Psychiatry

## 2020-02-17 ENCOUNTER — Other Ambulatory Visit: Payer: Self-pay

## 2020-02-17 DIAGNOSIS — F411 Generalized anxiety disorder: Secondary | ICD-10-CM

## 2020-02-17 DIAGNOSIS — F4323 Adjustment disorder with mixed anxiety and depressed mood: Secondary | ICD-10-CM

## 2020-02-17 DIAGNOSIS — F5101 Primary insomnia: Secondary | ICD-10-CM | POA: Diagnosis not present

## 2020-02-17 MED ORDER — BREXPIPRAZOLE 1 MG PO TABS: 1.0000 mg | ORAL_TABLET | Freq: Every day | ORAL | 1 refills | Status: DC

## 2020-02-17 MED ORDER — DAYVIGO 10 MG PO TABS: 1.0000 | ORAL_TABLET | Freq: Every day | ORAL | 2 refills | Status: DC

## 2020-02-17 MED ORDER — DULOXETINE HCL 30 MG PO CPEP: 90.0000 mg | ORAL_CAPSULE | Freq: Every day | ORAL | 1 refills | Status: DC

## 2020-02-17 MED ORDER — ALPRAZOLAM 1 MG PO TABS: ORAL_TABLET | ORAL | 2 refills | Status: DC

## 2020-02-17 NOTE — Progress Notes (Signed)
Michaela Jones [id number] Oct 18, 1975 44 y.o.  Subjective:   Patient ID:  Michaela Jones is a 44 y.o. (DOB 11-12-1975) female.  Chief Complaint:  Chief Complaint  Patient presents with  . Follow-up    h/o Anxiety, ADD, and Depression    HPI Michaela Jones presents to the office today for follow-up of depression and anxiety. She is accompanied by her husband. She reports that her case was dismissed 2 weeks ago. 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 She reports that her anxiety is decreased with resolution of acute stressors. She reports moments of anxiety. She reports improved mood. Sleep is occasionally disrupted with getting up to care for nephew. Energy and motivation have been lower with taking care of baby. She reports that she has been trying to improve motivation and has set goals. Reports concentration has been decreased recently. Reports that she has not taken Armodafinil recently and plans to take it more often. She reports that her appetite has been good and they have been eating healthier. Denies SI.     Past Psychiatric Medication Trials: Cymbalta Sertraline Remeron Wellbutrin Buspar Nuvigil Provigil- increased activation Ritalin Concerta Adderall- Not as effective Strattera Klonopin Xanax Vistaril- Ineffective Ambien- Effective. Caused parasomnias.  Lunesta Temazepam Trazodone-Ineffective Belsomra Doxepin Silenor Seroquel- wt gain Gabapentin- Stuttering Lyrica Trileptal- tingling in extremities  Review of Systems:  Review of Systems  Cardiovascular: Negative for palpitations.  Musculoskeletal: Positive for back pain. Negative for gait problem.  Neurological: Negative for tremors.  Psychiatric/Behavioral:       Please refer to HPI    Medications: I have reviewed the patient's current medications.  Current Outpatient Medications  Medication Sig Dispense Refill  . albuterol  (PROVENTIL HFA;VENTOLIN HFA) 108 (90 Base) MCG/ACT inhaler Inhale 2 puffs into the lungs every 6 (six) hours as needed for wheezing or shortness of breath.    Marland Kitchen albuterol (PROVENTIL) (5 MG/ML) 0.5% nebulizer solution Inhale into the lungs.    Melene Muller ON 03/16/2020] ALPRAZolam (XANAX) 1 MG tablet TAKE 1/2 TO 1 TABLET BY MOUTH THREE TIMES DAILY AS NEEDED ANXIETY 90 tablet 2  . Armodafinil 150 MG tablet Take 1 tablet (150 mg total) by mouth every morning. 30 tablet 2  . brexpiprazole (REXULTI) 1 MG TABS tablet Take 1 tablet (1 mg total) by mouth daily. 90 tablet 1  . celecoxib (CELEBREX) 200 MG capsule Take 200 mg by mouth as needed.     . cetirizine (ZYRTEC) 10 MG tablet Take 10 mg by mouth daily as needed for allergies.    . Cholecalciferol (VITAMIN D) 50 MCG (2000 UT) CAPS Take by mouth.    . cyanocobalamin (,VITAMIN B-12,) 1000 MCG/ML injection INJECT 1 MILLILITER (ML) ONCE MONTHLY 6 mL 4  . cyclobenzaprine (FLEXERIL) 10 MG tablet Take 1 tablet (10 mg total) by mouth at bedtime. (Patient not taking: Reported on 05/31/2019) 30 tablet 0  . DULoxetine (CYMBALTA) 30 MG capsule Take 3 capsules (90 mg total) by mouth daily. 270 capsule 1  . fluticasone (FLONASE) 50 MCG/ACT nasal spray Place 2 sprays into both nostrils daily. (Patient not taking: Reported on 12/29/2018) 16 g 6  . [START ON 03/06/2020] Lemborexant (DAYVIGO) 10 MG TABS Take 1 tablet by mouth at bedtime. 30 tablet 2  . Lemborexant (DAYVIGO) 5 MG TABS Take 5 mg by mouth at bedtime. May increase to 10 mg po QHS 10 tablet 0  . levETIRAcetam (KEPPRA) 1000 MG  tablet Take by mouth 2 (two) times daily.     Marland Kitchen levothyroxine (SYNTHROID, LEVOTHROID) 112 MCG tablet TAKE 1 TABLET BY MOUTH ONCE DAILY 90 tablet 4  . meloxicam (MOBIC) 15 MG tablet Take by mouth.    . Multiple Vitamin (MULTIVITAMIN) tablet Take 1 tablet by mouth daily.    Marland Kitchen oxymorphone (OPANA) 10 MG tablet Take 5-10 mg by mouth 4 (four) times daily as needed.     . promethazine (PHENERGAN) 25  MG tablet TAKE 1/2 TO 1 TABLET BY MOUTH EVERY 6 HOURS AS NEEED FOR NAUSEA (Patient not taking: Reported on 10/06/2019) 10 tablet 6  . promethazine (PHENERGAN) 6.25 MG/5ML syrup Take 10 mLs (12.5 mg total) by mouth every 6 (six) hours as needed for nausea or vomiting. (Patient not taking: Reported on 07/03/2018) 120 mL 0  . promethazine-dextromethorphan (PROMETHAZINE-DM) 6.25-15 MG/5ML syrup Take 5 mLs by mouth at bedtime as needed for cough. (Patient not taking: Reported on 07/03/2018) 118 mL 0   No current facility-administered medications for this visit.    Medication Side Effects: None  Allergies:  Allergies  Allergen Reactions  . Lyrica [Pregabalin] Other (See Comments)    Patient reports neurological problem. Speech disturbances and unable to control motor skills  . Gabapentin Swelling  . Nsaids Other (See Comments)    Avoids due to gastric bypass  . Penicillins Rash    Past Medical History:  Diagnosis Date  . Anemia   . Chronic back pain   . Complication of anesthesia    HARD TO WAKE UP/OXYGEN LEVEL DROPPED AFTER BACK SURGERY X1  . Constipation, chronic   . Depression   . Edema   . History of seasonal allergies    pt has inhalers for this if needed  . Hypotension   . Hypothyroid   . Insomnia   . Lymphedema of both lower extremities   . Migraine   . Restless leg syndrome   . Vitamin D deficiency     Family History  Problem Relation Age of Onset  . Pulmonary embolism Mother   . Depression Mother   . Asthma Mother   . Heart attack Father   . Cancer Father     Social History   Socioeconomic History  . Marital status: Married    Spouse name: Not on file  . Number of children: Not on file  . Years of education: Not on file  . Highest education level: Not on file  Occupational History  . Not on file  Tobacco Use  . Smoking status: Never Smoker  . Smokeless tobacco: Never Used  Substance and Sexual Activity  . Alcohol use: Yes    Alcohol/week: 0.0 standard  drinks    Comment: Rare  . Drug use: No  . Sexual activity: Not on file  Other Topics Concern  . Not on file  Social History Narrative  . Not on file   Social Determinants of Health   Financial Resource Strain:   . Difficulty of Paying Living Expenses:   Food Insecurity:   . Worried About Programme researcher, broadcasting/film/video in the Last Year:   . Barista in the Last Year:   Transportation Needs:   . Freight forwarder (Medical):   Marland Kitchen Lack of Transportation (Non-Medical):   Physical Activity:   . Days of Exercise per Week:   . Minutes of Exercise per Session:   Stress:   . Feeling of Stress :   Social Connections:   . Frequency  of Communication with Friends and Family:   . Frequency of Social Gatherings with Friends and Family:   . Attends Religious Services:   . Active Member of Clubs or Organizations:   . Attends Banker Meetings:   Marland Kitchen Marital Status:   Intimate Partner Violence:   . Fear of Current or Ex-Partner:   . Emotionally Abused:   Marland Kitchen Physically Abused:   . Sexually Abused:     Past Medical History, Surgical history, Social history, and Family history were reviewed and updated as appropriate.   Please see review of systems for further details on the patient's review from today.   Objective:   Physical Exam:  BP 101/66   Pulse 90   Physical Exam Constitutional:      General: She is not in acute distress. Musculoskeletal:        General: No deformity.  Neurological:     Mental Status: She is alert and oriented to person, place, and time.     Coordination: Coordination normal.  Psychiatric:        Attention and Perception: Attention and perception normal. She does not perceive auditory or visual hallucinations.        Mood and Affect: Mood normal. Mood is not anxious or depressed. Affect is not labile, blunt, angry or inappropriate.        Speech: Speech normal.        Behavior: Behavior normal.        Thought Content: Thought content normal.  Thought content is not paranoid or delusional. Thought content does not include homicidal or suicidal ideation. Thought content does not include homicidal or suicidal plan.        Cognition and Memory: Cognition and memory normal.        Judgment: Judgment normal.     Comments: Insight intact     Lab Review:     Component Value Date/Time   NA 136 02/23/2018 1645   NA 137 03/27/2016 1217   NA 139 12/03/2012 1136   K 3.7 02/23/2018 1645   K 4.0 12/03/2012 1136   CL 102 02/23/2018 1645   CL 105 12/03/2012 1136   CO2 24 02/23/2018 1645   CO2 26 12/03/2012 1136   GLUCOSE 121 (H) 02/23/2018 1645   GLUCOSE 77 12/03/2012 1136   BUN 13 02/23/2018 1645   BUN 8 03/27/2016 1217   BUN 16 12/03/2012 1136   CREATININE 0.72 02/23/2018 1645   CREATININE 0.46 (L) 12/03/2012 1136   CALCIUM 8.5 (L) 02/23/2018 1645   CALCIUM 9.0 12/03/2012 1136   PROT 6.3 03/27/2016 1217   ALBUMIN 4.0 03/27/2016 1217   AST 20 03/27/2016 1217   ALT 18 03/27/2016 1217   ALKPHOS 74 03/27/2016 1217   BILITOT 0.3 03/27/2016 1217   GFRNONAA >60 02/23/2018 1645   GFRNONAA >60 12/03/2012 1136   GFRAA >60 02/23/2018 1645   GFRAA >60 12/03/2012 1136       Component Value Date/Time   WBC 9.6 02/23/2018 1612   RBC 4.01 02/23/2018 1612   HGB 12.0 02/23/2018 1612   HGB 10.9 (L) 03/27/2016 1217   HCT 36.2 02/23/2018 1612   HCT 33.2 (L) 03/27/2016 1217   PLT 351 02/23/2018 1612   PLT 275 03/27/2016 1217   MCV 90.3 02/23/2018 1612   MCV 92 03/27/2016 1217   MCV 101 (H) 06/15/2013 1042   MCH 29.9 02/23/2018 1612   MCHC 33.1 02/23/2018 1612   RDW 17.7 (H) 02/23/2018 1612   RDW 14.3  03/27/2016 1217   RDW 14.5 06/15/2013 1042   LYMPHSABS 1.1 03/27/2016 1217   LYMPHSABS 3.5 06/15/2013 1042   MONOABS 0.9 06/15/2013 1042   EOSABS 0.1 03/27/2016 1217   EOSABS 0.1 06/15/2013 1042   BASOSABS 0.0 03/27/2016 1217   BASOSABS 0.1 06/15/2013 1042    No results found for: POCLITH, LITHIUM   No results found for:  PHENYTOIN, PHENOBARB, VALPROATE, CBMZ   .res Assessment: Plan:   Discussed restarting Nuvigil to improve concentration, energy, and motivation.  Discussed that patient continues to have prescription on file at her pharmacy. Continue Dayvigo 10 mg at bedtime for insomnia. Continue Cymbalta 90 mg daily for mood and anxiety. Continue Xanax 1 mg 1/2 to 1 tablet 3 times daily as needed for anxiety. Patient to follow-up in 3 months or sooner if clinically indicated. Patient advised to contact office with any questions, adverse effects, or acute worsening in signs and symptoms.  Delice Bisonara was seen today for follow-up.  Diagnoses and all orders for this visit:  Generalized anxiety disorder -     ALPRAZolam (XANAX) 1 MG tablet; TAKE 1/2 TO 1 TABLET BY MOUTH THREE TIMES DAILY AS NEEDED ANXIETY  Primary insomnia -     ALPRAZolam (XANAX) 1 MG tablet; TAKE 1/2 TO 1 TABLET BY MOUTH THREE TIMES DAILY AS NEEDED ANXIETY  Adjustment disorder with mixed anxiety and depressed mood -     brexpiprazole (REXULTI) 1 MG TABS tablet; Take 1 tablet (1 mg total) by mouth daily. -     DULoxetine (CYMBALTA) 30 MG capsule; Take 3 capsules (90 mg total) by mouth daily.  Other orders -     Lemborexant (DAYVIGO) 10 MG TABS; Take 1 tablet by mouth at bedtime.     Please see After Visit Summary for patient specific instructions.  Future Appointments  Date Time Provider Department Center  05/18/2020  9:00 AM Corie Chiquitoarter, Archana Eckman, PMHNP [facility] None    No orders of the defined types were placed in this encounter.   -------------------------------

## 2020-02-21 ENCOUNTER — Other Ambulatory Visit
Admission: RE | Admit: 2020-02-21 | Discharge: 2020-02-21 | Disposition: A | Discharge status: Home / Self Care | Payer: 59 | Attending: Pain Medicine | Admitting: Pain Medicine

## 2020-02-21 DIAGNOSIS — Z5181 Encounter for therapeutic drug level monitoring: Secondary | ICD-10-CM | POA: Diagnosis not present

## 2020-02-21 LAB — URINE DRUG SCREEN, QUALITATIVE (ARMC ONLY)
Amphetamines, Ur Screen: NOT DETECTED
Barbiturates, Ur Screen: NOT DETECTED
Benzodiazepine, Ur Scrn: POSITIVE — AB
Cannabinoid 50 Ng, Ur ~~LOC~~: NOT DETECTED
Cocaine Metabolite,Ur ~~LOC~~: NOT DETECTED
MDMA (Ecstasy)Ur Screen: NOT DETECTED
Methadone Scn, Ur: NOT DETECTED
Opiate, Ur Screen: POSITIVE — AB
Phencyclidine (PCP) Ur S: NOT DETECTED
Tricyclic, Ur Screen: POSITIVE — AB

## 2020-02-24 ENCOUNTER — Ambulatory Visit: Payer: 59 | Admitting: Physician Assistant

## 2020-02-25 NOTE — Progress Notes (Signed)
Marland Kitchen    MyChart Video Visit    Virtual Visit via Video Note   This visit type was conducted due to national recommendations for restrictions regarding the COVID-19 Pandemic (e.g. social distancing) in an effort to limit this patient's exposure and mitigate transmission in our community. This patient is at least at moderate risk for complications without adequate follow up. This format is felt to be most appropriate for this patient at this time. Physical exam was limited by quality of the video and audio technology used for the visit.   Patient location: home Provider location: office    I discussed the limitations of evaluation and management by telemedicine and the availability of in person appointments. The patient expressed understanding and agreed to proceed.  Patient: Michaela Jones   DOB: 1976/04/13   44 y.o. Female  MRN: [id number] Visit Date: 02/28/2020  Today's healthcare provider: Trey Sailors, PA-C   Chief Complaint  Patient presents with  . Back Pain   Subjective    HPI   Patient with chronic back pain, lumbar postlaminectomy and lumbosacral radiculopathy presents today for worsening of low back pain. She is followed by [facility] for pain management of these issues and is on chronic opioid therapy. She reports she is unable to see them until later this month. Reports worsening low back pain which she believes is brought on by watching her young nephew, picking him up, etc. She denies weakness in her legs, falling, new numbness or tingling. She has tried flexeril previously and felt this provided incomplete relief.     Medications: Outpatient Medications Prior to Visit  Medication Sig  . albuterol (PROVENTIL HFA;VENTOLIN HFA) 108 (90 Base) MCG/ACT inhaler Inhale 2 puffs into the lungs every 6 (six) hours as needed for wheezing or shortness of breath.  Marland Kitchen albuterol (PROVENTIL) (5 MG/ML) 0.5% nebulizer solution Inhale into the lungs.  Melene Muller ON 03/16/2020] ALPRAZolam  (XANAX) 1 MG tablet TAKE 1/2 TO 1 TABLET BY MOUTH THREE TIMES DAILY AS NEEDED ANXIETY  . Armodafinil 150 MG tablet Take 1 tablet (150 mg total) by mouth every morning.  . brexpiprazole (REXULTI) 1 MG TABS tablet Take 1 tablet (1 mg total) by mouth daily.  . celecoxib (CELEBREX) 200 MG capsule Take 200 mg by mouth as needed.   . cetirizine (ZYRTEC) 10 MG tablet Take 10 mg by mouth daily as needed for allergies.  . Cholecalciferol (VITAMIN D) 50 MCG (2000 UT) CAPS Take by mouth.  . cyanocobalamin (,VITAMIN B-12,) 1000 MCG/ML injection INJECT 1 MILLILITER (ML) ONCE MONTHLY  . cyclobenzaprine (FLEXERIL) 10 MG tablet Take 1 tablet (10 mg total) by mouth at bedtime. (Patient not taking: Reported on 05/31/2019)  . DULoxetine (CYMBALTA) 30 MG capsule Take 3 capsules (90 mg total) by mouth daily.  . fluticasone (FLONASE) 50 MCG/ACT nasal spray Place 2 sprays into both nostrils daily. (Patient not taking: Reported on 12/29/2018)  . [START ON 03/06/2020] Lemborexant (DAYVIGO) 10 MG TABS Take 1 tablet by mouth at bedtime.  . Lemborexant (DAYVIGO) 5 MG TABS Take 5 mg by mouth at bedtime. May increase to 10 mg po QHS  . levETIRAcetam (KEPPRA) 1000 MG tablet Take by mouth 2 (two) times daily.   Marland Kitchen levothyroxine (SYNTHROID, LEVOTHROID) 112 MCG tablet TAKE 1 TABLET BY MOUTH ONCE DAILY  . meloxicam (MOBIC) 15 MG tablet Take by mouth.  . Multiple Vitamin (MULTIVITAMIN) tablet Take 1 tablet by mouth daily.  Marland Kitchen oxymorphone (OPANA) 10 MG tablet Take 5-10 mg by  mouth 4 (four) times daily as needed.   . promethazine (PHENERGAN) 25 MG tablet TAKE 1/2 TO 1 TABLET BY MOUTH EVERY 6 HOURS AS NEEED FOR NAUSEA (Patient not taking: Reported on 10/06/2019)  . promethazine (PHENERGAN) 6.25 MG/5ML syrup Take 10 mLs (12.5 mg total) by mouth every 6 (six) hours as needed for nausea or vomiting. (Patient not taking: Reported on 07/03/2018)  . promethazine-dextromethorphan (PROMETHAZINE-DM) 6.25-15 MG/5ML syrup Take 5 mLs by mouth at bedtime as  needed for cough. (Patient not taking: Reported on 07/03/2018)   No facility-administered medications prior to visit.    Review of Systems    Objective    There were no vitals taken for this visit.   Physical Exam Constitutional:      Appearance: Normal appearance.  Neurological:     Mental Status: She is alert.  Psychiatric:        Mood and Affect: Mood normal.        Behavior: Behavior normal.        Assessment & Plan    1. Lumbosacral radiculopathy  Acute on chronic worsening of known low back pain. She continues pain management with [facility]. Will prescribe robaxin as below. Counseled that muscle relaxer is unlikely to provide complete relief and she will probably have some degree of remaining pain considering her other chronic MSK issues. Counseled this can cause drowsiness and to the best of her ability she should separate this from her Xanax and Opana.   We will schedule patient for a CPE w/ PAP and labwork as she is due.   - methocarbamol (ROBAXIN) 500 MG tablet; Take 1 tablet (500 mg total) by mouth 3 (three) times daily as needed for muscle spasms.  Dispense: 90 tablet; Refill: 0  2. Lumbar postlaminectomy syndrome  - methocarbamol (ROBAXIN) 500 MG tablet; Take 1 tablet (500 mg total) by mouth 3 (three) times daily as needed for muscle spasms.  Dispense: 90 tablet; Refill: 0    No follow-ups on file.     I discussed the assessment and treatment plan with the patient. The patient was provided an opportunity to ask questions and all were answered. The patient agreed with the plan and demonstrated an understanding of the instructions.   The patient was advised to call back or seek an in-person evaluation if the symptoms worsen or if the condition fails to improve as anticipated.   ITrey Sailors, PA-C, have reviewed all documentation for this visit. The documentation on 02/29/20 for the exam, diagnosis, procedures, and orders are all accurate and  complete.   Maryella Shivers [facility] [phone] (phone) [phone] (fax)  [facility]

## 2020-02-28 ENCOUNTER — Other Ambulatory Visit: Payer: Self-pay

## 2020-02-28 ENCOUNTER — Telehealth (INDEPENDENT_AMBULATORY_CARE_PROVIDER_SITE_OTHER): Payer: 59 | Admitting: Physician Assistant

## 2020-02-28 DIAGNOSIS — M961 Postlaminectomy syndrome, not elsewhere classified: Secondary | ICD-10-CM

## 2020-02-28 DIAGNOSIS — M5417 Radiculopathy, lumbosacral region: Secondary | ICD-10-CM | POA: Diagnosis not present

## 2020-02-28 MED ORDER — METHOCARBAMOL 500 MG PO TABS: 500.0000 mg | ORAL_TABLET | Freq: Three times a day (TID) | ORAL | 0 refills | Status: DC | PRN

## 2020-03-22 DIAGNOSIS — Z5181 Encounter for therapeutic drug level monitoring: Secondary | ICD-10-CM | POA: Diagnosis not present

## 2020-03-22 DIAGNOSIS — R200 Anesthesia of skin: Secondary | ICD-10-CM | POA: Diagnosis not present

## 2020-03-22 DIAGNOSIS — G894 Chronic pain syndrome: Secondary | ICD-10-CM | POA: Diagnosis not present

## 2020-03-22 DIAGNOSIS — M961 Postlaminectomy syndrome, not elsewhere classified: Secondary | ICD-10-CM | POA: Diagnosis not present

## 2020-03-22 DIAGNOSIS — M5417 Radiculopathy, lumbosacral region: Secondary | ICD-10-CM | POA: Diagnosis not present

## 2020-03-22 DIAGNOSIS — R202 Paresthesia of skin: Secondary | ICD-10-CM | POA: Diagnosis not present

## 2020-03-22 DIAGNOSIS — Z79891 Long term (current) use of opiate analgesic: Secondary | ICD-10-CM | POA: Diagnosis not present

## 2020-03-29 ENCOUNTER — Encounter: Payer: Self-pay | Admitting: Physician Assistant

## 2020-04-04 ENCOUNTER — Other Ambulatory Visit: Payer: Self-pay

## 2020-04-04 ENCOUNTER — Ambulatory Visit: Payer: 59 | Admitting: Physician Assistant

## 2020-04-04 DIAGNOSIS — R3989 Other symptoms and signs involving the genitourinary system: Secondary | ICD-10-CM | POA: Diagnosis not present

## 2020-04-04 DIAGNOSIS — N3090 Cystitis, unspecified without hematuria: Secondary | ICD-10-CM | POA: Diagnosis not present

## 2020-04-04 DIAGNOSIS — Z23 Encounter for immunization: Secondary | ICD-10-CM | POA: Diagnosis not present

## 2020-04-04 NOTE — Progress Notes (Signed)
Established patient visit   Patient: Michaela Jones   DOB: 07/18/1976   44 y.o. Female  MRN: [id number] Visit Date: 04/04/2020  Today's healthcare provider: Trey Sailors, PA-C   Chief Complaint  Patient presents with  . Urinary Frequency   Subjective    HPI   Urinary Tract Infection: Patient complains of frequency She has had symptoms for 3 days. Patient also complains of back pain. Patient denies fever and vaginal discharge. Patient does not have a history of recurrent UTI.  Patient does not have a history of pyelonephritis.  Denies chills, vomiting, vaginal bleeding.     Medications: Outpatient Medications Prior to Visit  Medication Sig  . albuterol (PROVENTIL HFA;VENTOLIN HFA) 108 (90 Base) MCG/ACT inhaler Inhale 2 puffs into the lungs every 6 (six) hours as needed for wheezing or shortness of breath.  Marland Kitchen albuterol (PROVENTIL) (5 MG/ML) 0.5% nebulizer solution Inhale into the lungs.  . ALPRAZolam (XANAX) 1 MG tablet TAKE 1/2 TO 1 TABLET BY MOUTH THREE TIMES DAILY AS NEEDED ANXIETY  . Armodafinil 150 MG tablet Take 1 tablet (150 mg total) by mouth every morning.  . brexpiprazole (REXULTI) 1 MG TABS tablet Take 1 tablet (1 mg total) by mouth daily.  . cetirizine (ZYRTEC) 10 MG tablet Take 10 mg by mouth daily as needed for allergies.  . Cholecalciferol (VITAMIN D) 50 MCG (2000 UT) CAPS Take by mouth.  . fluticasone (FLONASE) 50 MCG/ACT nasal spray Place 2 sprays into both nostrils daily.  . Lemborexant (DAYVIGO) 10 MG TABS Take 1 tablet by mouth at bedtime.  . levETIRAcetam (KEPPRA) 1000 MG tablet Take by mouth 2 (two) times daily.   Marland Kitchen levothyroxine (SYNTHROID, LEVOTHROID) 112 MCG tablet TAKE 1 TABLET BY MOUTH ONCE DAILY  . meloxicam (MOBIC) 15 MG tablet Take by mouth.  . Multiple Vitamin (MULTIVITAMIN) tablet Take 1 tablet by mouth daily.  Marland Kitchen oxymorphone (OPANA) 10 MG tablet Take 5-10 mg by mouth 4 (four) times daily as needed.   . [DISCONTINUED] celecoxib (CELEBREX) 200  MG capsule Take 200 mg by mouth as needed.  (Patient not taking: Reported on 04/07/2020)  . [DISCONTINUED] cyanocobalamin (,VITAMIN B-12,) 1000 MCG/ML injection INJECT 1 MILLILITER (ML) ONCE MONTHLY  . [DISCONTINUED] cyclobenzaprine (FLEXERIL) 10 MG tablet Take 1 tablet (10 mg total) by mouth at bedtime. (Patient not taking: Reported on 05/31/2019)  . [DISCONTINUED] DULoxetine (CYMBALTA) 30 MG capsule Take 3 capsules (90 mg total) by mouth daily. (Patient not taking: Reported on 04/07/2020)  . [DISCONTINUED] Lemborexant (DAYVIGO) 5 MG TABS Take 5 mg by mouth at bedtime. May increase to 10 mg po QHS (Patient not taking: Reported on 04/07/2020)  . [DISCONTINUED] methocarbamol (ROBAXIN) 500 MG tablet Take 1 tablet (500 mg total) by mouth 3 (three) times daily as needed for muscle spasms. (Patient not taking: Reported on 04/07/2020)  . [DISCONTINUED] promethazine (PHENERGAN) 25 MG tablet TAKE 1/2 TO 1 TABLET BY MOUTH EVERY 6 HOURS AS NEEED FOR NAUSEA (Patient not taking: Reported on 10/06/2019)  . [DISCONTINUED] promethazine (PHENERGAN) 6.25 MG/5ML syrup Take 10 mLs (12.5 mg total) by mouth every 6 (six) hours as needed for nausea or vomiting. (Patient not taking: Reported on 07/03/2018)  . [DISCONTINUED] promethazine-dextromethorphan (PROMETHAZINE-DM) 6.25-15 MG/5ML syrup Take 5 mLs by mouth at bedtime as needed for cough. (Patient not taking: Reported on 07/03/2018)   No facility-administered medications prior to visit.    Review of Systems    Objective    There were no vitals taken for  this visit.   Physical Exam Constitutional:      General: She is not in acute distress.    Appearance: She is well-developed. She is not diaphoretic.  Cardiovascular:     Rate and Rhythm: Normal rate and regular rhythm.  Pulmonary:     Effort: Pulmonary effort is normal.     Breath sounds: Normal breath sounds.  Abdominal:     General: Bowel sounds are normal. There is no distension.     Palpations: Abdomen is  soft.     Tenderness: There is abdominal tenderness in the suprapubic area. There is no guarding or rebound.  Skin:    General: Skin is warm and dry.  Neurological:     Mental Status: She is alert and oriented to person, place, and time.  Psychiatric:        Behavior: Behavior normal.       Results for orders placed or performed in visit on 04/04/20  Urine Culture   Specimen: Urine   UR  Result Value Ref Range   Urine Culture, Routine Final report (A)    Organism ID, Bacteria Comment (A)    Antimicrobial Susceptibility Comment     Assessment & Plan     1. Cystitis  - Urine Culture - sulfamethoxazole-trimethoprim (BACTRIM DS) 800-160 MG tablet; Take 1 tablet by mouth 2 (two) times daily for 3 days.  Dispense: 6 tablet; Refill: 0  2. Need for influenza vaccination  - Flu Vaccine QUAD 36+ mos IM   No follow-ups on file.      ITrey Sailors, PA-C, have reviewed all documentation for this visit. The documentation on 04/11/20 for the exam, diagnosis, procedures, and orders are all accurate and complete.  The entirety of the information documented in the History of Present Illness, Review of Systems and Physical Exam were personally obtained by me. Portions of this information were initially documented by [facility] and reviewed by me for thoroughness and accuracy.     Maryella Shivers  [facility] [phone] (phone) [phone] (fax)  [facility]

## 2020-04-06 ENCOUNTER — Telehealth: Payer: Self-pay

## 2020-04-06 MED ORDER — SULFAMETHOXAZOLE-TRIMETHOPRIM 800-160 MG PO TABS: 1.0000 | ORAL_TABLET | Freq: Two times a day (BID) | ORAL | 0 refills | Status: AC

## 2020-04-06 NOTE — Telephone Encounter (Signed)
Called patient and left a message to return call, if patient calls back okay for PEC to advise of message.

## 2020-04-06 NOTE — Telephone Encounter (Signed)
-----   Message from Trey Sailors, [place] sent at 04/06/2020 12:09 PM EDT ----- Your urine culture is growing a bacteria. It is at a number lower than what we typically see causes infection, however since you are having symptoms, I sent in bactrim as an antibiotic.

## 2020-04-06 NOTE — Progress Notes (Signed)
Complete physical exam   Patient: Michaela Jones   DOB: 1976/05/08   44 y.o. Female  MRN: [id number] Visit Date: 04/07/2020  Today's healthcare provider: Trey SailorsAdriana M Athony Coppa, PA-C   Chief Complaint  Patient presents with  . Annual Exam   Subjective    Michaela Jones is a 44 y.o. female who presents today for a complete physical exam.  She reports consuming a general diet. The patient does not participate in regular exercise at present. She generally feels fairly well. She reports sleeping poorly. She does have additional problems to discuss today.   Obesity  History of bariatric surgery remotely but has since regained weight. She is working on modifying dietary habits. She is interested in injectable weight loss medicine.   Wt Readings from Last 3 Encounters:  04/07/20 272 lb (123.4 kg)  02/23/18 240 lb (108.9 kg)  12/17/17 242 lb (109.8 kg)     Anemia  She suspects she is anemic. She is reporting cravings for ice. She denies dizziness, syncope.  CBC Latest Ref Rng & Units 04/07/2020 02/23/2018 03/27/2016  WBC 3.4 - 10.8 x10E3/uL 10.5 9.6 8.6  Hemoglobin 11.1 - 15.9 g/dL 7.8(G9.6(L) 95.612.0 10.9(L)  Hematocrit 34.0 - 46.6 % 32.4(L) 36.2 33.2(L)  Platelets 150 - 450 x10E3/uL 405 351 275     Low Back Pain  She is having additional low back pain. [facility] and receives chronic opioid therapy for this. She is having some muscle spasms which she reports were helped by her muscle relaxer, flexeril. Muscle spasms make walking difficult and impede physical activity.   Past Medical History:  Diagnosis Date  . Anemia   . Chronic back pain   . Complication of anesthesia    HARD TO WAKE UP/OXYGEN LEVEL DROPPED AFTER BACK SURGERY X1  . Constipation, chronic   . Depression   . Edema   . History of seasonal allergies    pt has inhalers for this if needed  . Hypotension   . Hypothyroid   . Insomnia   . Lymphedema of both lower extremities   . Migraine   .  Restless leg syndrome   . Vitamin D deficiency    Past Surgical History:  Procedure Laterality Date  . ABLATION  2014  . BACK SURGERY  10/2012  . BREAST ENHANCEMENT SURGERY  07/2008  . BREAST SURGERY  09/05/2011   removal of implants  . CHOLECYSTECTOMY N/A 04/16/2016   Procedure: LAPAROSCOPIC CHOLECYSTECTOMY WITH INTRAOPERATIVE CHOLANGIOGRAM;  Surgeon: Earline MayotteJeffrey W Byrnett, MD;  Location: [facility] ORS;  Service: General;  Laterality: N/A;  . GASTRIC BYPASS  2002  . Lower body  01/2008, 03/2008   lift legs and thighs  . tummy tuck  2004   Social History   Socioeconomic History  . Marital status: Married    Spouse name: Not on file  . Number of children: Not on file  . Years of education: Not on file  . Highest education level: Not on file  Occupational History  . Not on file  Tobacco Use  . Smoking status: Never Smoker  . Smokeless tobacco: Never Used  Substance and Sexual Activity  . Alcohol use: Yes    Alcohol/week: 0.0 standard drinks    Comment: Rare  . Drug use: No  . Sexual activity: Not on file  Other Topics Concern  . Not on file  Social History Narrative  . Not on file   Social Determinants of Health  Financial Resource Strain:   . Difficulty of Paying Living Expenses: Not on file  Food Insecurity:   . Worried About Programme researcher, broadcasting/film/video in the Last Year: Not on file  . Ran Out of Food in the Last Year: Not on file  Transportation Needs:   . Lack of Transportation (Medical): Not on file  . Lack of Transportation (Non-Medical): Not on file  Physical Activity:   . Days of Exercise per Week: Not on file  . Minutes of Exercise per Session: Not on file  Stress:   . Feeling of Stress : Not on file  Social Connections:   . Frequency of Communication with Friends and Family: Not on file  . Frequency of Social Gatherings with Friends and Family: Not on file  . Attends Religious Services: Not on file  . Active Member of Clubs or Organizations: Not on file  . Attends  Banker Meetings: Not on file  . Marital Status: Not on file  Intimate Partner Violence:   . Fear of Current or Ex-Partner: Not on file  . Emotionally Abused: Not on file  . Physically Abused: Not on file  . Sexually Abused: Not on file   Family Status  Relation Name Status  . Mother  Deceased at age 9       cause of death PE and asthma exasperation  . Father  Deceased at age 66       cause of death MI   Family History  Problem Relation Age of Onset  . Pulmonary embolism Mother   . Depression Mother   . Asthma Mother   . Heart attack Father   . Cancer Father    Allergies  Allergen Reactions  . Lyrica [Pregabalin] Other (See Comments)    Patient reports neurological problem. Speech disturbances and unable to control motor skills  . Gabapentin Swelling  . Nsaids Other (See Comments)    Avoids due to gastric bypass  . Penicillins Rash    Patient Care Team: Maryella Shivers as PCP - General (Physician Assistant) Anola Gurney, PA as Referring Physician (Family Medicine) Earline Mayotte, MD (General Surgery)   Medications: Outpatient Medications Prior to Visit  Medication Sig  . albuterol (PROVENTIL HFA;VENTOLIN HFA) 108 (90 Base) MCG/ACT inhaler Inhale 2 puffs into the lungs every 6 (six) hours as needed for wheezing or shortness of breath.  Marland Kitchen albuterol (PROVENTIL) (5 MG/ML) 0.5% nebulizer solution Inhale into the lungs.  . ALPRAZolam (XANAX) 1 MG tablet TAKE 1/2 TO 1 TABLET BY MOUTH THREE TIMES DAILY AS NEEDED ANXIETY  . brexpiprazole (REXULTI) 1 MG TABS tablet Take 1 tablet (1 mg total) by mouth daily.  . cetirizine (ZYRTEC) 10 MG tablet Take 10 mg by mouth daily as needed for allergies.  . Cholecalciferol (VITAMIN D) 50 MCG (2000 UT) CAPS Take by mouth.  . cyanocobalamin (,VITAMIN B-12,) 1000 MCG/ML injection INJECT 1 MILLILITER (ML) ONCE MONTHLY  . fluticasone (FLONASE) 50 MCG/ACT nasal spray Place 2 sprays into both nostrils daily.  .  Lemborexant (DAYVIGO) 10 MG TABS Take 1 tablet by mouth at bedtime.  Marland Kitchen levothyroxine (SYNTHROID, LEVOTHROID) 112 MCG tablet TAKE 1 TABLET BY MOUTH ONCE DAILY  . meloxicam (MOBIC) 15 MG tablet Take by mouth.  . Multiple Vitamin (MULTIVITAMIN) tablet Take 1 tablet by mouth daily.  Marland Kitchen oxymorphone (OPANA) 10 MG tablet Take 5-10 mg by mouth 4 (four) times daily as needed.   . sulfamethoxazole-trimethoprim (BACTRIM DS) 800-160 MG tablet Take 1  tablet by mouth 2 (two) times daily for 3 days.  . Armodafinil 150 MG tablet Take 1 tablet (150 mg total) by mouth every morning.  . celecoxib (CELEBREX) 200 MG capsule Take 200 mg by mouth as needed.  (Patient not taking: Reported on 04/07/2020)  . cyclobenzaprine (FLEXERIL) 10 MG tablet Take 1 tablet (10 mg total) by mouth at bedtime. (Patient not taking: Reported on 05/31/2019)  . DULoxetine (CYMBALTA) 30 MG capsule Take 3 capsules (90 mg total) by mouth daily. (Patient not taking: Reported on 04/07/2020)  . Lemborexant (DAYVIGO) 5 MG TABS Take 5 mg by mouth at bedtime. May increase to 10 mg po QHS (Patient not taking: Reported on 04/07/2020)  . levETIRAcetam (KEPPRA) 1000 MG tablet Take by mouth 2 (two) times daily.   . methocarbamol (ROBAXIN) 500 MG tablet Take 1 tablet (500 mg total) by mouth 3 (three) times daily as needed for muscle spasms. (Patient not taking: Reported on 04/07/2020)  . promethazine (PHENERGAN) 25 MG tablet TAKE 1/2 TO 1 TABLET BY MOUTH EVERY 6 HOURS AS NEEED FOR NAUSEA (Patient not taking: Reported on 10/06/2019)  . promethazine (PHENERGAN) 6.25 MG/5ML syrup Take 10 mLs (12.5 mg total) by mouth every 6 (six) hours as needed for nausea or vomiting. (Patient not taking: Reported on 07/03/2018)  . promethazine-dextromethorphan (PROMETHAZINE-DM) 6.25-15 MG/5ML syrup Take 5 mLs by mouth at bedtime as needed for cough. (Patient not taking: Reported on 07/03/2018)   No facility-administered medications prior to visit.    Review of Systems    Constitutional: Negative.   HENT: Negative.   Eyes: Negative.   Respiratory: Negative.   Cardiovascular: Negative.   Gastrointestinal: Negative.   Endocrine: Negative.   Genitourinary: Negative.   Musculoskeletal: Negative.   Skin: Negative.   Allergic/Immunologic: Negative.   Neurological: Negative.   Hematological: Negative.   Psychiatric/Behavioral: Negative.        Objective    BP 115/69   Pulse (!) 115   Temp 98.3 F (36.8 C)   Ht 5\' 5"  (1.651 m)   Wt 272 lb (123.4 kg)   BMI 45.26 kg/m    Physical Exam Exam conducted with a chaperone present.  Constitutional:      Appearance: Normal appearance. She is obese.  HENT:     Right Ear: Tympanic membrane normal.     Left Ear: Tympanic membrane normal.  Cardiovascular:     Rate and Rhythm: Normal rate and regular rhythm.     Pulses: Normal pulses.     Heart sounds: Normal heart sounds.  Pulmonary:     Effort: Pulmonary effort is normal.     Breath sounds: Normal breath sounds.  Abdominal:     General: Bowel sounds are normal.  Genitourinary:    General: Normal vulva.     Vagina: Normal.     Cervix: Normal.  Skin:    General: Skin is warm and dry.  Neurological:     Mental Status: She is alert and oriented to person, place, and time. Mental status is at baseline.  Psychiatric:        Mood and Affect: Mood normal.        Behavior: Behavior normal.        Last depression screening scores No flowsheet data found. Last fall risk screening No flowsheet data found. Last Audit-C alcohol use screening No flowsheet data found. A score of 3 or more in women, and 4 or more in men indicates increased risk for alcohol abuse, EXCEPT if all of  the points are from question 1   No results found for any visits on 04/07/20.  Assessment & Plan    Routine Health Maintenance and Physical Exam  Exercise Activities and Dietary recommendations Goals   None     Immunization History  Administered Date(s)  Administered  . Influenza Split 06/13/2007, 05/07/2008, 06/26/2009  . Influenza,inj,Quad PF,6+ Mos 04/19/2013, 04/12/2014, 05/26/2015, 05/18/2018, 04/04/2020  . Influenza-Unspecified 05/16/2018, 05/15/2019  . PFIZER SARS-COV-2 Vaccination 09/30/2019, 10/23/2019  . Td 12/10/2017  . Tdap 08/27/2006    Health Maintenance  Topic Date Due  . Hepatitis C Screening  Never done  . HIV Screening  Never done  . PAP SMEAR-Modifier  11/22/2019  . TETANUS/TDAP  12/11/2027  . INFLUENZA VACCINE  Completed  . COVID-19 Vaccine  Completed    Discussed health benefits of physical activity, and encouraged her to engage in regular exercise appropriate for her age and condition.  1. Annual physical exam  - HIV Antibody (routine testing w rflx) - TSH - Lipid panel - Comprehensive metabolic panel - CBC with Differential/Platelet - Cytology - PAP - Fe+TIBC+Fer - B12 and Folate Panel - Vitamin D (25 hydroxy)  2. Anxiety  Followed by psychiatry.   3. Gastric bypass status for obesity  - TSH - Fe+TIBC+Fer - B12 and Folate Panel - Vitamin D (25 hydroxy) - Insulin Pen Needle (NOVOFINE) 32G X 6 MM MISC; Use once weekly to inject wegovy  Dispense: 50 each; Refill: 0  4. Morbid obesity (HCC)  Start wegovy as below. Counseled on side effects and will likely need PA.  2 - Semaglutide-Weight Management (WEGOVY) 0.25 MG/0.5ML SOAJ; Inject 0.25 mg into the skin once a week.  Dispense: 2 mL; Refill: 3 - Insulin Pen Needle (NOVOFINE) 32G X 6 MM MISC; Use once weekly to inject wegovy  Dispense: 50 each; Refill: 0  5. B12 deficiency  - cyanocobalamin (,VITAMIN B-12,) 1000 MCG/ML injection; INJECT 1 MILLILITER (ML) ONCE MONTHLY  Dispense: 6 mL; Refill: 4  6. Lumbar postlaminectomy syndrome  Start flexeril as below.   - cyclobenzaprine (FLEXERIL) 10 MG tablet; Take 1 tablet (10 mg total) by mouth 3 (three) times daily as needed for muscle spasms.  Dispense: 30 tablet; Refill: 0  7. Iron deficiency  anemia, unspecified iron deficiency anemia type  Ferrous sulfate 325 mg TID. Recheck labs at follow up.    No follow-ups on file.     ITrey Sailors, PA-C, have reviewed all documentation for this visit. The documentation on 04/10/20 for the exam, diagnosis, procedures, and orders are all accurate and complete.  The entirety of the information documented in the History of Present Illness, Review of Systems and Physical Exam were personally obtained by me. Portions of this information were initially documented by [place]son [place], CMA and reviewed by me for thoroughness and accuracy.     Maryella Shivers  [facility] [phone] (phone) [phone] (fax)  [facility]

## 2020-04-06 NOTE — Telephone Encounter (Signed)
Pt given lab results per notes of Osvaldo Angst, PA-C on 04/06/20. Pt verbalized understanding.

## 2020-04-07 ENCOUNTER — Other Ambulatory Visit: Payer: Self-pay

## 2020-04-07 ENCOUNTER — Ambulatory Visit (INDEPENDENT_AMBULATORY_CARE_PROVIDER_SITE_OTHER): Payer: 59 | Admitting: Physician Assistant

## 2020-04-07 ENCOUNTER — Other Ambulatory Visit (HOSPITAL_COMMUNITY)
Admission: RE | Admit: 2020-04-07 | Discharge: 2020-04-07 | Disposition: A | Discharge status: Home / Self Care | Payer: 59 | Source: Ambulatory Visit | Attending: Physician Assistant | Admitting: Physician Assistant

## 2020-04-07 ENCOUNTER — Encounter: Payer: Self-pay | Admitting: Physician Assistant

## 2020-04-07 VITALS — BP 115/69 | HR 115 | Temp 98.3°F | Ht 65.0 in | Wt 272.0 lb

## 2020-04-07 DIAGNOSIS — E538 Deficiency of other specified B group vitamins: Secondary | ICD-10-CM

## 2020-04-07 DIAGNOSIS — Z0000 Encounter for general adult medical examination without abnormal findings: Secondary | ICD-10-CM | POA: Insufficient documentation

## 2020-04-07 DIAGNOSIS — D509 Iron deficiency anemia, unspecified: Secondary | ICD-10-CM

## 2020-04-07 DIAGNOSIS — R87610 Atypical squamous cells of undetermined significance on cytologic smear of cervix (ASC-US): Secondary | ICD-10-CM | POA: Diagnosis not present

## 2020-04-07 DIAGNOSIS — F419 Anxiety disorder, unspecified: Secondary | ICD-10-CM

## 2020-04-07 DIAGNOSIS — Z9884 Bariatric surgery status: Secondary | ICD-10-CM

## 2020-04-07 DIAGNOSIS — M961 Postlaminectomy syndrome, not elsewhere classified: Secondary | ICD-10-CM

## 2020-04-07 MED ORDER — CYANOCOBALAMIN 1000 MCG/ML IJ SOLN: INTRAMUSCULAR | 4 refills | Status: DC

## 2020-04-07 MED ORDER — NOVOFINE 32G X 6 MM MISC: 0 refills | Status: DC

## 2020-04-07 MED ORDER — WEGOVY 0.25 MG/0.5ML ~~LOC~~ SOAJ: 0.2500 mg | SUBCUTANEOUS | 3 refills | Status: DC

## 2020-04-07 MED ORDER — CYCLOBENZAPRINE HCL 10 MG PO TABS: 10.0000 mg | ORAL_TABLET | Freq: Three times a day (TID) | ORAL | 0 refills | Status: DC | PRN

## 2020-04-08 LAB — B12 AND FOLATE PANEL
Folate: 11.3 ng/mL (ref >3.0)
Vitamin B-12: 265 pg/mL (ref 232–1245)

## 2020-04-08 LAB — CBC WITH DIFFERENTIAL/PLATELET
Basophils Absolute: 0.1 10*3/uL (ref 0.0–0.2)
Basos: 1 %
EOS (ABSOLUTE): 0.2 10*3/uL (ref 0.0–0.4)
Eos: 2 %
Hematocrit: 32.4 % — ABNORMAL LOW (ref 34.0–46.6)
Hemoglobin: 9.6 g/dL — ABNORMAL LOW (ref 11.1–15.9)
Immature Grans (Abs): 0 10*3/uL (ref 0.0–0.1)
Immature Granulocytes: 0 %
Lymphocytes Absolute: 2.5 10*3/uL (ref 0.7–3.1)
Lymphs: 23 %
MCH: 25.3 pg — ABNORMAL LOW (ref 26.6–33.0)
MCHC: 29.6 g/dL — ABNORMAL LOW (ref 31.5–35.7)
MCV: 85 fL (ref 79–97)
Monocytes Absolute: 0.9 10*3/uL (ref 0.1–0.9)
Monocytes: 9 %
Neutrophils Absolute: 6.8 10*3/uL (ref 1.4–7.0)
Neutrophils: 65 %
Platelets: 405 10*3/uL (ref 150–450)
RBC: 3.8 x10E6/uL (ref 3.77–5.28)
RDW: 15.7 % — ABNORMAL HIGH (ref 11.7–15.4)
WBC: 10.5 10*3/uL (ref 3.4–10.8)

## 2020-04-08 LAB — LIPID PANEL
Chol/HDL Ratio: 5.1 ratio — ABNORMAL HIGH (ref 0.0–4.4)
Cholesterol, Total: 243 mg/dL — ABNORMAL HIGH (ref 100–199)
HDL: 48 mg/dL (ref >39)
LDL Chol Calc (NIH): 139 mg/dL — ABNORMAL HIGH (ref 0–99)
Triglycerides: 312 mg/dL — ABNORMAL HIGH (ref 0–149)
VLDL Cholesterol Cal: 56 mg/dL — ABNORMAL HIGH (ref 5–40)

## 2020-04-08 LAB — COMPREHENSIVE METABOLIC PANEL
ALT: 24 IU/L (ref 0–32)
AST: 39 IU/L (ref 0–40)
Albumin/Globulin Ratio: 1.3 (ref 1.2–2.2)
Albumin: 4.1 g/dL (ref 3.8–4.8)
Alkaline Phosphatase: 116 IU/L (ref 48–121)
BUN/Creatinine Ratio: 14 (ref 9–23)
BUN: 11 mg/dL (ref 6–24)
Bilirubin Total: 0.3 mg/dL (ref 0.0–1.2)
CO2: 22 mmol/L (ref 20–29)
Calcium: 8.4 mg/dL — ABNORMAL LOW (ref 8.7–10.2)
Chloride: 99 mmol/L (ref 96–106)
Creatinine, Ser: 0.81 mg/dL (ref 0.57–1.00)
GFR calc Af Amer: 102 mL/min/{1.73_m2} (ref >59)
GFR calc non Af Amer: 89 mL/min/{1.73_m2} (ref >59)
Globulin, Total: 3.2 g/dL (ref 1.5–4.5)
Glucose: 91 mg/dL (ref 65–99)
Potassium: 4.4 mmol/L (ref 3.5–5.2)
Sodium: 134 mmol/L (ref 134–144)
Total Protein: 7.3 g/dL (ref 6.0–8.5)

## 2020-04-08 LAB — IRON,TIBC AND FERRITIN PANEL
Ferritin: 8 ng/mL — ABNORMAL LOW (ref 15–150)
Iron Saturation: 6 % — CL (ref 15–55)
Iron: 29 ug/dL (ref 27–159)
Total Iron Binding Capacity: 496 ug/dL — ABNORMAL HIGH (ref 250–450)
UIBC: 467 ug/dL — ABNORMAL HIGH (ref 131–425)

## 2020-04-08 LAB — VITAMIN D 25 HYDROXY (VIT D DEFICIENCY, FRACTURES): Vit D, 25-Hydroxy: 9.1 ng/mL — ABNORMAL LOW (ref 30.0–100.0)

## 2020-04-08 LAB — HIV ANTIBODY (ROUTINE TESTING W REFLEX): HIV Screen 4th Generation wRfx: NONREACTIVE

## 2020-04-08 LAB — TSH: TSH: 20.5 u[IU]/mL — ABNORMAL HIGH (ref 0.450–4.500)

## 2020-04-09 LAB — URINE CULTURE

## 2020-04-11 LAB — CYTOLOGY - PAP
Adequacy: ABSENT
Comment: NEGATIVE
Diagnosis: UNDETERMINED — AB
High risk HPV: NEGATIVE

## 2020-04-19 ENCOUNTER — Telehealth: Payer: Self-pay

## 2020-04-19 DIAGNOSIS — Z5181 Encounter for therapeutic drug level monitoring: Secondary | ICD-10-CM | POA: Diagnosis not present

## 2020-04-19 DIAGNOSIS — D509 Iron deficiency anemia, unspecified: Secondary | ICD-10-CM

## 2020-04-19 DIAGNOSIS — F1110 Opioid abuse, uncomplicated: Secondary | ICD-10-CM | POA: Diagnosis not present

## 2020-04-19 DIAGNOSIS — F558 Abuse of other non-psychoactive substances: Secondary | ICD-10-CM | POA: Diagnosis not present

## 2020-04-19 DIAGNOSIS — E039 Hypothyroidism, unspecified: Secondary | ICD-10-CM

## 2020-04-19 MED ORDER — LEVOTHYROXINE SODIUM 125 MCG PO TABS: 125.0000 ug | ORAL_TABLET | Freq: Every day | ORAL | 0 refills | Status: DC

## 2020-04-19 NOTE — Telephone Encounter (Signed)
Per patient she is not able to take iron supplement so prefers the infusion

## 2020-04-19 NOTE — Telephone Encounter (Signed)
-----   Message from Trey Sailors, [place] sent at 04/19/2020  3:52 PM EDT ----- TSH under corrected, please send in synthroid 125 mcg QD # 90. Anemic with low iron. Is she taking iron supplement? If not, start ferrous sulfate 325 mg TID. If she is, may need to refer for infusion. PAP with ASCUS and HPV negative, need to repeat in 3 years. Schedule follow up in 1 month.

## 2020-04-21 NOTE — Telephone Encounter (Signed)
Patient was advised.  

## 2020-04-21 NOTE — Telephone Encounter (Signed)
Referral to hematology placed for iron infusion.

## 2020-04-26 NOTE — Progress Notes (Signed)
Established patient visit   Patient: Michaela Jones   DOB: 1976-05-13   44 y.o. Female  MRN: [id number] Visit Date: 04/27/2020  Today's healthcare provider: Trey Sailors, PA-C   Chief Complaint  Patient presents with  . Weight Loss   Subjective    HPI  Weight Loss Patient reports she could not started her weight loss medication due to pharmacy not able to get medication.  She has been managed by chronic pain management at [facility] by Concha Se, PA for 8 years. She reports she has been discharged due to inappropriate drug screen including tramadol. She reports she accidentally took her dog's medication. Patient was discharged from chronic pain management and given Opana taper and advised to find a new pain management doctor. She has previously been to [place] pain [facility] and does not wish to return.. She requests that she continue to receive chronic pain management here. She does have an upcoming appointment with pain management at [facility] on 05/03/2020. If unable to provide chronic pain management, she requests prescription of hydrocodone to get to new pain management clinic.   She also requests refill of Keppra 1000 mg BID which was provided by [facility] for arm pain.   She also was receiving meloxicam 15 mg QD from [facility].   She also reports Reginal Lutes is not in stock for weight loss and requests saxenda instead.     Medications: Outpatient Medications Prior to Visit  Medication Sig  . albuterol (PROVENTIL HFA;VENTOLIN HFA) 108 (90 Base) MCG/ACT inhaler Inhale 2 puffs into the lungs every 6 (six) hours as needed for wheezing or shortness of breath.  Marland Kitchen albuterol (PROVENTIL) (5 MG/ML) 0.5% nebulizer solution Inhale into the lungs.  . ALPRAZolam (XANAX) 1 MG tablet TAKE 1/2 TO 1 TABLET BY MOUTH THREE TIMES DAILY AS NEEDED ANXIETY  . brexpiprazole (REXULTI) 1 MG TABS tablet Take 1 tablet (1 mg total) by mouth daily.  . cetirizine (ZYRTEC) 10 MG tablet Take 10 mg by  mouth daily as needed for allergies.  . Cholecalciferol (VITAMIN D) 50 MCG (2000 UT) CAPS Take by mouth.  . cyanocobalamin (,VITAMIN B-12,) 1000 MCG/ML injection INJECT 1 MILLILITER (ML) ONCE MONTHLY  . fluticasone (FLONASE) 50 MCG/ACT nasal spray Place 2 sprays into both nostrils daily.  . Insulin Pen Needle (NOVOFINE) 32G X 6 MM MISC Use once weekly to inject wegovy  . Lemborexant (DAYVIGO) 10 MG TABS Take 1 tablet by mouth at bedtime.  Marland Kitchen levothyroxine (SYNTHROID) 125 MCG tablet Take 1 tablet (125 mcg total) by mouth daily.  . meloxicam (MOBIC) 15 MG tablet Take by mouth.  . Multiple Vitamin (MULTIVITAMIN) tablet Take 1 tablet by mouth daily.  . Armodafinil 150 MG tablet Take 1 tablet (150 mg total) by mouth every morning.  . cyclobenzaprine (FLEXERIL) 10 MG tablet Take 1 tablet (10 mg total) by mouth 3 (three) times daily as needed for muscle spasms. (Patient not taking: Reported on 04/27/2020)  . levETIRAcetam (KEPPRA) 1000 MG tablet Take by mouth 2 (two) times daily.   Marland Kitchen oxymorphone (OPANA) 10 MG tablet Take 5-10 mg by mouth 4 (four) times daily as needed.  (Patient not taking: Reported on 04/27/2020)  . Semaglutide-Weight Management (WEGOVY) 0.25 MG/0.5ML SOAJ Inject 0.25 mg into the skin once a week. (Patient not taking: Reported on 04/27/2020)   No facility-administered medications prior to visit.    Review of Systems    Objective    BP (!) 141/72 (BP Location: Left Arm,  Patient Position: Sitting, Cuff Size: Large)   Pulse (!) 104   Temp 98.7 F (37.1 C) (Oral)   Wt 271 lb 3.2 oz (123 kg)   SpO2 99%   BMI 45.13 kg/m    Physical Exam Constitutional:      Appearance: Normal appearance.  Cardiovascular:     Rate and Rhythm: Normal rate.  Pulmonary:     Effort: Pulmonary effort is normal.  Neurological:     Mental Status: She is alert and oriented to person, place, and time. Mental status is at baseline.  Psychiatric:        Mood and Affect: Mood normal.       No  results found for any visits on 04/27/20.  Assessment & Plan     1. Chronic low back pain, unspecified back pain laterality, unspecified whether sciatica present  Explained to patient that I do not manage chronic opioid therapy. Encouraged her to keep upcoming appointment. Referral placed for insurance.  - Ambulatory referral to Pain Clinic  2. Pain in both upper extremities  - levETIRAcetam (KEPPRA) 1000 MG tablet; Take 1 tablet (1,000 mg total) by mouth 2 (two) times daily.  Dispense: 180 tablet; Refill: 1 - meloxicam (MOBIC) 15 MG tablet; Take 1 tablet (15 mg total) by mouth daily.  Dispense: 30 tablet; Refill: 0  3. Morbid obesity (HCC)  - Liraglutide -Weight Management (SAXENDA) 18 MG/3ML SOPN; Inject 0.6 mg into the skin daily.  Dispense: 3 mL; Refill: 2 - Insulin Pen Needle (NOVOFINE PEN NEEDLE) 32G X 6 MM MISC; Use daily with saxenda.  Dispense: 100 each; Refill: 0   No follow-ups on file.      ITrey Sailors, PA-C, have reviewed all documentation for this visit. The documentation on 04/27/20 for the exam, diagnosis, procedures, and orders are all accurate and complete.  The entirety of the information documented in the History of Present Illness, Review of Systems and Physical Exam were personally obtained by me. Portions of this information were initially documented by [facility] and reviewed by me for thoroughness and accuracy.     Maryella Shivers  [facility] [phone] (phone) [phone] (fax)  [facility]

## 2020-04-27 ENCOUNTER — Encounter: Payer: Self-pay | Admitting: Physician Assistant

## 2020-04-27 ENCOUNTER — Ambulatory Visit (INDEPENDENT_AMBULATORY_CARE_PROVIDER_SITE_OTHER): Payer: 59 | Admitting: Physician Assistant

## 2020-04-27 ENCOUNTER — Other Ambulatory Visit: Payer: Self-pay

## 2020-04-27 ENCOUNTER — Other Ambulatory Visit: Payer: Self-pay | Admitting: Physician Assistant

## 2020-04-27 VITALS — BP 141/72 | HR 104 | Temp 98.7°F | Wt 271.2 lb

## 2020-04-27 DIAGNOSIS — M545 Low back pain, unspecified: Secondary | ICD-10-CM

## 2020-04-27 DIAGNOSIS — G8929 Other chronic pain: Secondary | ICD-10-CM

## 2020-04-27 DIAGNOSIS — M79601 Pain in right arm: Secondary | ICD-10-CM

## 2020-04-27 DIAGNOSIS — M79602 Pain in left arm: Secondary | ICD-10-CM | POA: Diagnosis not present

## 2020-04-27 MED ORDER — LEVETIRACETAM 1000 MG PO TABS: 1000.0000 mg | ORAL_TABLET | Freq: Two times a day (BID) | ORAL | 1 refills | Status: DC

## 2020-04-27 MED ORDER — NOVOFINE PEN NEEDLE 32G X 6 MM MISC: 0 refills | Status: DC

## 2020-04-27 MED ORDER — MELOXICAM 15 MG PO TABS: 15.0000 mg | ORAL_TABLET | Freq: Every day | ORAL | 0 refills | Status: DC

## 2020-04-27 MED ORDER — SAXENDA 18 MG/3ML ~~LOC~~ SOPN: 0.6000 mg | PEN_INJECTOR | Freq: Every day | SUBCUTANEOUS | 2 refills | Status: DC

## 2020-04-27 NOTE — Progress Notes (Signed)
  [facility]  Telephone:([phone]) [phone] Fax:([phone]) [phone]  ID: Michaela Jones OB: Aug 02, 1975  MR#: [id number]  UUE#:[id number]  Patient Care Team: Maryella Shivers as PCP - General (Physician Assistant) Anola Gurney, PA as Referring Physician (Family Medicine) Lemar Livings Merrily Pew, MD (General Surgery)     Jeralyn Ruths, MD   05/03/2020 7:12 AM     This encounter was created in error - please disregard.

## 2020-05-02 ENCOUNTER — Inpatient Hospital Stay: Payer: 59

## 2020-05-02 ENCOUNTER — Inpatient Hospital Stay: Payer: 59 | Admitting: Oncology

## 2020-05-05 NOTE — Progress Notes (Deleted)
[facility]  [phone] [phone]  ID: Michaela Jones OB: 02-14-1976  MR#: [id number]  AOZ#:[id number]  Patient Care Team: Maryella Shivers as PCP - General (Physician Assistant) Anola Gurney, PA as Referring Physician (Family Medicine) Lemar Livings Merrily Pew, MD (General Surgery)  CHIEF COMPLAINT: Iron deficiency anemia.  INTERVAL HISTORY: ***  REVIEW OF SYSTEMS:   ROS  As per HPI. Otherwise, a complete review of systems is negative.  PAST MEDICAL HISTORY: Past Medical History:  Diagnosis Date  . Anemia   . Chronic back pain   . Complication of anesthesia    HARD TO WAKE UP/OXYGEN LEVEL DROPPED AFTER BACK SURGERY X1  . Constipation, chronic   . Depression   . Edema   . History of seasonal allergies    pt has inhalers for this if needed  . Hypotension   . Hypothyroid   . Insomnia   . Lymphedema of both lower extremities   . Migraine   . Restless leg syndrome   . Vitamin D deficiency     PAST SURGICAL HISTORY: Past Surgical History:  Procedure Laterality Date  . ABLATION  2014  . BACK SURGERY  10/2012  . BREAST ENHANCEMENT SURGERY  07/2008  . BREAST SURGERY  09/05/2011   removal of implants  . CHOLECYSTECTOMY N/A 04/16/2016   Procedure: LAPAROSCOPIC CHOLECYSTECTOMY WITH INTRAOPERATIVE CHOLANGIOGRAM;  Surgeon: Earline Mayotte, MD;  Location: [facility] ORS;  Service: General;  Laterality: N/A;  . GASTRIC BYPASS  2002  . Lower body  01/2008, 03/2008   lift legs and thighs  . tummy tuck  2004    FAMILY HISTORY: Family History  Problem Relation Age of Onset  . Pulmonary embolism Mother   . Depression Mother   . Asthma Mother   . Heart attack Father   . Cancer Father     ADVANCED DIRECTIVES (Y/N):  N  HEALTH MAINTENANCE: Social History   Tobacco Use  . Smoking status: Never Smoker  . Smokeless tobacco: Never Used  Substance Use Topics  . Alcohol use: Yes    Alcohol/week: 0.0 standard drinks    Comment: Rare   . Drug use: No     Colonoscopy:  PAP:  Bone density:  Lipid panel:  Allergies  Allergen Reactions  . Lyrica [Pregabalin] Other (See Comments)    Patient reports neurological problem. Speech disturbances and unable to control motor skills  . Gabapentin Swelling  . Nsaids Other (See Comments)    Avoids due to gastric bypass  . Penicillins Rash    Current Outpatient Medications  Medication Sig Dispense Refill  . albuterol (PROVENTIL HFA;VENTOLIN HFA) 108 (90 Base) MCG/ACT inhaler Inhale 2 puffs into the lungs every 6 (six) hours as needed for wheezing or shortness of breath.    Marland Kitchen albuterol (PROVENTIL) (5 MG/ML) 0.5% nebulizer solution Inhale into the lungs.    . ALPRAZolam (XANAX) 1 MG tablet TAKE 1/2 TO 1 TABLET BY MOUTH THREE TIMES DAILY AS NEEDED ANXIETY 90 tablet 2  . Armodafinil 150 MG tablet Take 1 tablet (150 mg total) by mouth every morning. 30 tablet 2  . brexpiprazole (REXULTI) 1 MG TABS tablet Take 1 tablet (1 mg total) by mouth daily. 90 tablet 1  . cetirizine (ZYRTEC) 10 MG tablet Take 10 mg by mouth daily as needed for allergies.    . Cholecalciferol (VITAMIN D) 50 MCG (2000 UT) CAPS Take by mouth.    . cyanocobalamin (,VITAMIN B-12,) 1000 MCG/ML injection INJECT 1 MILLILITER (ML)  ONCE MONTHLY 6 mL 4  . cyclobenzaprine (FLEXERIL) 10 MG tablet Take 1 tablet (10 mg total) by mouth 3 (three) times daily as needed for muscle spasms. (Patient not taking: Reported on 04/27/2020) 30 tablet 0  . fluticasone (FLONASE) 50 MCG/ACT nasal spray Place 2 sprays into both nostrils daily. 16 g 6  . Insulin Pen Needle (NOVOFINE PEN NEEDLE) 32G X 6 MM MISC Use daily with saxenda. 100 each 0  . Lemborexant (DAYVIGO) 10 MG TABS Take 1 tablet by mouth at bedtime. 30 tablet 2  . levETIRAcetam (KEPPRA) 1000 MG tablet Take 1 tablet (1,000 mg total) by mouth 2 (two) times daily. 180 tablet 1  . levothyroxine (SYNTHROID) 125 MCG tablet Take 1 tablet (125 mcg total) by mouth daily. 90 tablet 0  .  Liraglutide -Weight Management (SAXENDA) 18 MG/3ML SOPN Inject 0.6 mg into the skin daily. 3 mL 2  . meloxicam (MOBIC) 15 MG tablet Take 1 tablet (15 mg total) by mouth daily. 30 tablet 0  . Multiple Vitamin (MULTIVITAMIN) tablet Take 1 tablet by mouth daily.    Marland Kitchen oxymorphone (OPANA) 10 MG tablet Take 5-10 mg by mouth 4 (four) times daily as needed.  (Patient not taking: Reported on 04/27/2020)     No current facility-administered medications for this visit.    OBJECTIVE: There were no vitals filed for this visit.   There is no height or weight on file to calculate BMI.    ECOG FS:{CHL ONC [id number]  General: Well-developed, well-nourished, no acute distress. Eyes: Pink conjunctiva, anicteric sclera. HEENT: Normocephalic, moist mucous membranes. Lungs: No audible wheezing or coughing. Heart: Regular rate and rhythm. Abdomen: Soft, nontender, no obvious distention. Musculoskeletal: No edema, cyanosis, or clubbing. Neuro: Alert, answering all questions appropriately. Cranial nerves grossly intact. Skin: No rashes or petechiae noted. Psych: Normal affect. Lymphatics: No cervical, calvicular, axillary or inguinal LAD.   LAB RESULTS:  Lab Results  Component Value Date   NA 134 04/07/2020   K 4.4 04/07/2020   CL 99 04/07/2020   CO2 22 04/07/2020   GLUCOSE 91 04/07/2020   BUN 11 04/07/2020   CREATININE 0.81 04/07/2020   CALCIUM 8.4 (L) 04/07/2020   PROT 7.3 04/07/2020   ALBUMIN 4.1 04/07/2020   AST 39 04/07/2020   ALT 24 04/07/2020   ALKPHOS 116 04/07/2020   BILITOT 0.3 04/07/2020   GFRNONAA 89 04/07/2020   GFRAA 102 04/07/2020    Lab Results  Component Value Date   WBC 10.5 04/07/2020   NEUTROABS 6.8 04/07/2020   HGB 9.6 (L) 04/07/2020   HCT 32.4 (L) 04/07/2020   MCV 85 04/07/2020   PLT 405 04/07/2020     STUDIES: No results found.  ASSESSMENT: Iron deficiency anemia.   PLAN:    1. Iron deficiency anemia:  Patient expressed understanding and was in  agreement with this plan. She also understands that She can call clinic at any time with any questions, concerns, or complaints.   Cancer Staging No matching staging information was found for the patient.  Jeralyn Ruths, MD   05/05/2020 12:30 PM

## 2020-05-09 ENCOUNTER — Other Ambulatory Visit: Payer: Self-pay | Admitting: Pain Medicine

## 2020-05-09 DIAGNOSIS — M5416 Radiculopathy, lumbar region: Secondary | ICD-10-CM | POA: Diagnosis not present

## 2020-05-09 DIAGNOSIS — Z79899 Other long term (current) drug therapy: Secondary | ICD-10-CM | POA: Diagnosis not present

## 2020-05-09 DIAGNOSIS — M792 Neuralgia and neuritis, unspecified: Secondary | ICD-10-CM | POA: Diagnosis not present

## 2020-05-12 ENCOUNTER — Other Ambulatory Visit: Payer: 59

## 2020-05-12 ENCOUNTER — Encounter: Payer: 59 | Admitting: Oncology

## 2020-05-12 DIAGNOSIS — D509 Iron deficiency anemia, unspecified: Secondary | ICD-10-CM

## 2020-05-15 NOTE — Progress Notes (Deleted)
Established patient visit   Patient: Michaela Jones   DOB: 12-Aug-1975   44 y.o. Female  MRN: [id number] Visit Date: 05/16/2020  Today's healthcare provider: [name], MD   Chief Complaint  Patient presents with   Back Pain   Subjective    Back Pain This is a chronic problem. Pertinent negatives include no abdominal pain, chest pain, fever or weakness.    Patient was seen by Orthopedic specialist Dr. Sherryll Burger on 05/09/2020 for lumbar radiculopathy and Neuropathic pain. Prescription for Hydrocodone-Acetaminophen 28 day supply given with goal to wean off opiates within a month. Patient was advised to Start taking only 2 pills of Keppra for one week, then 1 pill of Keppra, then off.     {Show patient history (optional):23778::" "}   Medications: Outpatient Medications Prior to Visit  Medication Sig   albuterol (PROVENTIL HFA;VENTOLIN HFA) 108 (90 Base) MCG/ACT inhaler Inhale 2 puffs into the lungs every 6 (six) hours as needed for wheezing or shortness of breath.   albuterol (PROVENTIL) (5 MG/ML) 0.5% nebulizer solution Inhale into the lungs.   ALPRAZolam (XANAX) 1 MG tablet TAKE 1/2 TO 1 TABLET BY MOUTH THREE TIMES DAILY AS NEEDED ANXIETY   cetirizine (ZYRTEC) 10 MG tablet Take 10 mg by mouth daily as needed for allergies.   Cholecalciferol (VITAMIN D) 50 MCG (2000 UT) CAPS Take by mouth.   cyanocobalamin (,VITAMIN B-12,) 1000 MCG/ML injection INJECT 1 MILLILITER (ML) ONCE MONTHLY   fluticasone (FLONASE) 50 MCG/ACT nasal spray Place 2 sprays into both nostrils daily.   Insulin Pen Needle (NOVOFINE PEN NEEDLE) 32G X 6 MM MISC Use daily with saxenda.   levothyroxine (SYNTHROID) 125 MCG tablet Take 1 tablet (125 mcg total) by mouth daily.   Liraglutide -Weight Management (SAXENDA) 18 MG/3ML SOPN Inject 0.6 mg into the skin daily.   meloxicam (MOBIC) 15 MG tablet Take 1 tablet (15 mg total) by mouth daily.   Multiple Vitamin (MULTIVITAMIN) tablet Take 1 tablet by mouth  daily.   Armodafinil 150 MG tablet Take 1 tablet (150 mg total) by mouth every morning.   brexpiprazole (REXULTI) 1 MG TABS tablet Take 1 tablet (1 mg total) by mouth daily. (Patient not taking: Reported on 05/16/2020)   cyclobenzaprine (FLEXERIL) 10 MG tablet Take 1 tablet (10 mg total) by mouth 3 (three) times daily as needed for muscle spasms. (Patient not taking: Reported on 04/27/2020)   Lemborexant (DAYVIGO) 10 MG TABS Take 1 tablet by mouth at bedtime. (Patient not taking: Reported on 05/16/2020)   levETIRAcetam (KEPPRA) 1000 MG tablet Take 1 tablet (1,000 mg total) by mouth 2 (two) times daily. (Patient not taking: Reported on 05/16/2020)   oxymorphone (OPANA) 10 MG tablet Take 5-10 mg by mouth 4 (four) times daily as needed.  (Patient not taking: Reported on 04/27/2020)   No facility-administered medications prior to visit.    Review of Systems  Constitutional: Negative.  Negative for appetite change, chills, fatigue and fever.  Respiratory: Negative.  Negative for chest tightness and shortness of breath.   Cardiovascular: Negative.  Negative for chest pain and palpitations.  Gastrointestinal: Negative for abdominal pain, nausea and vomiting.  Musculoskeletal: Positive for back pain.  Neurological: Negative for dizziness and weakness.    {Heme   Chem   Endocrine   Serology   Results Review (optional):23779::" "}  Objective    Temp 100 F (37.8 C) (Oral)    Wt 270 lb (122.5 kg)    BMI 44.93 kg/m  {  Show previous vital signs (optional):23777::" "}  Physical Exam  ***  No results found for any visits on 05/16/20.  Assessment & Plan     ***  No follow-ups on file.      {provider attestation***:1}   [name], MD  [facility] [phone] (phone) [phone] (fax)  [facility]

## 2020-05-16 ENCOUNTER — Telehealth (INDEPENDENT_AMBULATORY_CARE_PROVIDER_SITE_OTHER): Payer: 59 | Admitting: Family Medicine

## 2020-05-16 ENCOUNTER — Ambulatory Visit: Payer: 59 | Admitting: Family Medicine

## 2020-05-16 ENCOUNTER — Encounter: Payer: Self-pay | Admitting: Family Medicine

## 2020-05-16 ENCOUNTER — Encounter (INDEPENDENT_AMBULATORY_CARE_PROVIDER_SITE_OTHER): Payer: 59 | Admitting: Family Medicine

## 2020-05-16 ENCOUNTER — Other Ambulatory Visit: Payer: Self-pay

## 2020-05-16 VITALS — Temp 100.0°F | Wt 270.0 lb

## 2020-05-16 DIAGNOSIS — I890 Lymphedema, not elsewhere classified: Secondary | ICD-10-CM | POA: Diagnosis not present

## 2020-05-16 DIAGNOSIS — G894 Chronic pain syndrome: Secondary | ICD-10-CM | POA: Diagnosis not present

## 2020-05-16 DIAGNOSIS — F1190 Opioid use, unspecified, uncomplicated: Secondary | ICD-10-CM

## 2020-05-16 DIAGNOSIS — M545 Low back pain, unspecified: Secondary | ICD-10-CM

## 2020-05-16 DIAGNOSIS — G8929 Other chronic pain: Secondary | ICD-10-CM | POA: Diagnosis not present

## 2020-05-16 DIAGNOSIS — M961 Postlaminectomy syndrome, not elsewhere classified: Secondary | ICD-10-CM | POA: Diagnosis not present

## 2020-05-16 NOTE — Progress Notes (Signed)
MyChart Video Visit    Virtual Visit via Video Note   This visit type was conducted due to national recommendations for restrictions regarding the COVID-19 Pandemic (e.g. social distancing) in an effort to limit this patient's exposure and mitigate transmission in our community. This patient is at least at moderate risk for complications without adequate follow up. This format is felt to be most appropriate for this patient at this time. Physical exam was limited by quality of the video and audio technology used for the visit.   Patient location: Parked car Provider location: bfp  I discussed the limitations of evaluation and management by telemedicine and the availability of in person appointments. The patient expressed understanding and agreed to proceed.  Patient: Michaela Jones   DOB: 1976-06-06   44 y.o. Female  MRN: [id number] Visit Date: 05/16/2020  Today's healthcare provider: [name], MD   Chief Complaint  Patient presents with  . Back Pain   Subjective    HPI  Back Pain This is a chronic problem. She has long history low back pain s/p L5-S1 fusion by Dr. Mellody Dance in 2014 and chronic lymphedema causing persistent leg pain. She had been followed by several years at [facility] maintained on 40mg  hydromorphone a day but was discharged from their clinic in September due to urine drug screen being positive for tramadol. Patient reports their dog had recently died and was prescribed tramadol prior to its death, and she thinks the tramadol was inadvertently taken in place of hydromorphone. She states she has since weaned completely off of hydromorphone. She has one visit with Dr. October and [facility] in [place] and was given a short term prescription for hydrocodone/apap 7.5 325 and advised to wean off of it. She has also weaned off of Keppra at his recommendation.   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  She states pain becomes very severe as day progresses making it difficulty to continue to teach. She has found that the hydrocodone/apap helps manage the pain enough to get through the day. She feels she can managed with just the hydrocodone/apap twice a day. She does take 1mg  alprazolam TID prescribed by Sherryll Burger on same dose for several years without dosage excalation     Medications: Outpatient Medications Prior to Visit  Medication Sig  . albuterol (PROVENTIL HFA;VENTOLIN HFA) 108 (90 Base) MCG/ACT inhaler Inhale 2 puffs into the lungs every 6 (six) hours as needed for wheezing or shortness of breath.  albuterol (PROVENTIL) (5 MG/ML) 0.5% nebulizer solution Inhale into the lungs.  . ALPRAZolam (XANAX) 1 MG tablet TAKE 1/2 TO 1 TABLET BY MOUTH THREE TIMES DAILY AS NEEDED ANXIETY  . cetirizine (ZYRTEC) 10 MG tablet Take 10 mg by mouth daily as needed for allergies.  . Cholecalciferol (VITAMIN D) 50 MCG (2000 UT) CAPS Take by mouth.  . cyanocobalamin (,VITAMIN B-12,) 1000 MCG/ML injection INJECT 1 MILLILITER (ML) ONCE MONTHLY  . fluticasone (FLONASE) 50 MCG/ACT nasal spray Place 2 sprays into both nostrils daily.  . Insulin Pen Needle (NOVOFINE PEN NEEDLE) 32G X 6 MM MISC Use daily with saxenda.  . levothyroxine (SYNTHROID) 125 MCG tablet Take 1 tablet (125 mcg total) by mouth daily.  . Liraglutide -Weight Management (SAXENDA) 18 MG/3ML SOPN Inject 0.6 mg into the skin daily.  . meloxicam (MOBIC) 15 MG tablet Take 1 tablet (15 mg total) by mouth daily.  Corie Chiquito  Multiple Vitamin (MULTIVITAMIN) tablet Take 1 tablet by mouth daily.  . Armodafinil 150 MG tablet Take 1 tablet (150 mg total) by mouth every morning.  . brexpiprazole (REXULTI) 1 MG TABS tablet Take 1 tablet (1 mg total) by mouth daily. (Patient not taking: Reported on 05/16/2020)  . cyclobenzaprine (FLEXERIL) 10 MG tablet Take 1 tablet (10 mg total) by mouth 3 (three) times daily as needed for muscle spasms. (Patient not taking:  Reported on 04/27/2020)  . Lemborexant (DAYVIGO) 10 MG TABS Take 1 tablet by mouth at bedtime. (Patient not taking: Reported on 05/16/2020)  . levETIRAcetam (KEPPRA) 1000 MG tablet Take 1 tablet (1,000 mg total) by mouth 2 (two) times daily. (Patient not taking: Reported on 05/16/2020)  . oxymorphone (OPANA) 10 MG tablet Take 5-10 mg by mouth 4 (four) times daily as needed.  (Patient not taking: Reported on 04/27/2020)   No facility-administered medications prior to visit.    Review of Systems  Constitutional: Negative for appetite change, chills, fatigue and fever.  Respiratory: Negative for chest tightness and shortness of breath.   Cardiovascular: Negative for chest pain and palpitations.  Gastrointestinal: Negative for abdominal pain, nausea and vomiting.  Musculoskeletal: Positive for back pain.  Neurological: Negative for dizziness and weakness.      Objective    Temp 100 F (37.8 C)   Wt 270 lb (122.5 kg)   BMI 44.93 kg/m    Physical Exam   Awake, alert, oriented x 3. In no apparent distress    Assessment & Plan     1. Chronic low back pain, unspecified back pain laterality, unspecified whether sciatica present   2. Lumbar postlaminectomy syndrome   3. Acquired lymphedema   4. Chronic pain disorder   5. Chronic, continuous use of opioids Managed on high daily MMEs at [facility] pain clinic after 2014 surgery, recently abandoned due to presence of tramadol in UDS. She has dramatically reduced her daily MMEs since then, which is an accomplishment. 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 She was reminded of limitations and risks of managing chronic pain with long term opioids. I think this is relatively low risk if she able to continue to manage on under 20 MMEs per day. She still has about a weeks worth of hydrocodone/apap left that Dr. Sherryll Burger prescribed. She has tried multiple  medications from other classes of adjuvant pain medicines, but really doesn't want to experiment with others if she is able to take low dose of opioids. I agreed to fill hydrocodone/apap at no more than an average of 20 MMEs per day, which be about 2 or occasionally 3  of the 7.5/325mg  tablets per day   Will follow up with her in month. Consider adjuvant pain medications if she not being well maintained on this regiment.       I discussed the assessment and treatment plan with the patient. The patient was provided an opportunity to ask questions and all were answered. The patient agreed with the plan and demonstrated an understanding of the instructions.   The patient was advised to call back or seek an in-person evaluation if the symptoms worsen or if the condition fails to improve as anticipated.  I provided 12 minutes of non-face-to-face time during this encounter.  The entirety of the information documented in the History of Present Illness, Review of Systems and Physical Exam were personally obtained by me. Portions of  this information were initially documented by the CMA and reviewed by me for thoroughness and accuracy.     Lelon Huh, MD [facility] [phone] (phone) [phone] (fax)  [place]

## 2020-05-18 ENCOUNTER — Ambulatory Visit: Payer: 59 | Admitting: Psychiatry

## 2020-05-19 ENCOUNTER — Ambulatory Visit: Payer: Self-pay | Admitting: Physician Assistant

## 2020-05-19 ENCOUNTER — Other Ambulatory Visit: Payer: Self-pay | Admitting: Family Medicine

## 2020-05-19 MED ORDER — HYDROCODONE-ACETAMINOPHEN 7.5-325 MG PO TABS: ORAL_TABLET | ORAL | 0 refills | Status: DC

## 2020-05-22 ENCOUNTER — Ambulatory Visit: Payer: 59 | Admitting: Psychiatry

## 2020-05-24 ENCOUNTER — Other Ambulatory Visit: Payer: Self-pay | Admitting: Psychiatry

## 2020-05-24 DIAGNOSIS — F411 Generalized anxiety disorder: Secondary | ICD-10-CM

## 2020-05-24 DIAGNOSIS — F5101 Primary insomnia: Secondary | ICD-10-CM

## 2020-05-25 ENCOUNTER — Ambulatory Visit
Admission: EM | Admit: 2020-05-25 | Discharge: 2020-05-25 | Disposition: A | Discharge status: Home / Self Care | Payer: 59 | Attending: Emergency Medicine | Admitting: Emergency Medicine

## 2020-05-25 DIAGNOSIS — M79651 Pain in right thigh: Secondary | ICD-10-CM

## 2020-05-25 NOTE — ED Provider Notes (Signed)
Renaldo Fiddler    CSN: [id number] Arrival date & time: 05/25/20  1414      History   Chief Complaint Chief Complaint  Patient presents with   Leg Swelling    HPI Michaela Jones is a 44 y.o. female.   Patient presents with 1 week history of pain in her right inner thigh.  No falls or injury.  No fever, chills, redness, open wounds, rash, or other symptoms.  She reports intermittent numbness and tingling in her right foot.  The pain is worse with weightbearing and ambulation.  Patient has history of chronic low back pain, chronic pain, and chronic opioid use.  She had a video visit with her PCP on 05/16/2020; was prescribed hydrocodone/APAP. It is noted in her chart that she was previously followed by pain clinic but was recently discontinued due to the presence of tramadol and her urine drug screen. Her medical history also includes anemia, hypothyroid, depression, restless leg syndrome, migraines, insomnia, lymphedema.  The history is provided by the patient.    Past Medical History:  Diagnosis Date   Anemia    Chronic back pain    Complication of anesthesia    HARD TO WAKE UP/OXYGEN LEVEL DROPPED AFTER BACK SURGERY X1   Constipation, chronic    Depression    Edema    History of seasonal allergies    pt has inhalers for this if needed   Hypotension    Hypothyroid    Insomnia    Lymphedema of both lower extremities    Migraine    Restless leg syndrome    Vitamin D deficiency     Patient Active Problem List   Diagnosis Date Noted   Chronic pain disorder 05/16/2020   Chronic, continuous use of opioids 05/16/2020   Anxiety 06/17/2018   Lumbar postlaminectomy syndrome 10/23/2017   Lumbosacral radiculopathy 10/23/2017   Vitamin D deficiency 03/01/2015   Acquired spondylolisthesis 03/01/2015   Elevated LDL cholesterol level 03/01/2015   Chronic constipation 03/01/2015   Dysthymia 11/11/2013   Absolute anemia 10/15/2012   Gastric bypass  status for obesity 10/15/2012   Insomnia 09/03/2007   Allergic rhinitis 06/15/2007   Iron deficiency anemia 01/22/2007   Restless leg 09/23/2006   Headache, migraine 08/27/2006   Acquired lymphedema 07/29/1998   Adult hypothyroidism 07/29/1992    Past Surgical History:  Procedure Laterality Date   ABDOMINOPLASTY  2004   ABLATION  2014   BACK SURGERY  10/2012   BREAST ENHANCEMENT SURGERY  07/2008   BREAST SURGERY  09/05/2011   removal of implants   CHOLECYSTECTOMY N/A 04/16/2016   Procedure: LAPAROSCOPIC CHOLECYSTECTOMY WITH INTRAOPERATIVE CHOLANGIOGRAM;  Surgeon: Earline Mayotte, MD;  Location: [facility] ORS;  Service: General;  Laterality: N/A;   GASTRIC BYPASS  2002   Lower body  01/2008, 03/2008   lift legs and thighs    OB History   No obstetric history on file.      Home Medications    Prior to Admission medications   Medication Sig Start Date End Date Taking? Authorizing Provider  albuterol (PROVENTIL HFA;VENTOLIN HFA) 108 (90 Base) MCG/ACT inhaler Inhale 2 puffs into the lungs every 6 (six) hours as needed for wheezing or shortness of breath.    [provider]  albuterol (PROVENTIL) (5 MG/ML) 0.5% nebulizer solution Inhale into the lungs.    [provider]  ALPRAZolam Prudy Feeler) 1 MG tablet TAKE 1/2 TO 1 TABLET BY MOUTH THREE TIMES DAILY AS NEEDED ANXIETY 03/16/20  Corie Chiquito, PMHNP  Armodafinil 150 MG tablet Take 1 tablet (150 mg total) by mouth every morning. 10/06/19 11/05/19  Corie Chiquito, PMHNP  brexpiprazole (REXULTI) 1 MG TABS tablet Take 1 tablet (1 mg total) by mouth daily. Patient not taking: Reported on 05/16/2020 02/17/20 05/17/20  Corie Chiquito, PMHNP  cetirizine (ZYRTEC) 10 MG tablet Take 10 mg by mouth daily as needed for allergies.    [provider]  Cholecalciferol (VITAMIN D) 50 MCG (2000 UT) CAPS Take by mouth.    [provider]  cyanocobalamin (,VITAMIN B-12,) 1000 MCG/ML injection INJECT 1  MILLILITER (ML) ONCE MONTHLY 04/07/20   Trey Sailors, PA-C  cyclobenzaprine (FLEXERIL) 10 MG tablet Take 1 tablet (10 mg total) by mouth 3 (three) times daily as needed for muscle spasms. Patient not taking: Reported on 04/27/2020 04/07/20   Trey Sailors, PA-C  fluticasone [facility]) 50 MCG/ACT nasal spray Place 2 sprays into both nostrils daily. 12/17/17   Trey Sailors, PA-C  HYDROcodone-acetaminophen [facility]) 7.5-325 MG tablet One tablet two to three times daily as needed 05/19/20   Malva Limes, MD  Insulin Pen Needle (NOVOFINE PEN NEEDLE) 32G X 6 MM MISC Use daily with saxenda. 04/27/20   Trey Sailors, PA-C  Lemborexant (DAYVIGO) 10 MG TABS Take 1 tablet by mouth at bedtime. Patient not taking: Reported on 05/16/2020 03/06/20   Corie Chiquito, PMHNP  levETIRAcetam (KEPPRA) 1000 MG tablet Take 1 tablet (1,000 mg total) by mouth 2 (two) times daily. Patient not taking: Reported on 05/16/2020 04/27/20 10/24/20  Trey Sailors, PA-C  levothyroxine (SYNTHROID) 125 MCG tablet Take 1 tablet (125 mcg total) by mouth daily. 04/19/20   Trey Sailors, PA-C  Liraglutide -Weight Management (SAXENDA) 18 MG/3ML SOPN Inject 0.6 mg into the skin daily. 04/27/20   Trey Sailors, PA-C  meloxicam (MOBIC) 15 MG tablet Take 1 tablet (15 mg total) by mouth daily. 04/27/20   Trey Sailors, PA-C  Multiple Vitamin (MULTIVITAMIN) tablet Take 1 tablet by mouth daily.    [provider]    Family History Family History  Problem Relation Age of Onset   Pulmonary embolism Mother    Depression Mother    Asthma Mother    Heart attack Father    Cancer Father     Social History Social History   Tobacco Use   Smoking status: Never Smoker   Smokeless tobacco: Never Used  Substance Use Topics   Alcohol use: Yes    Alcohol/week: 0.0 standard drinks    Comment: Rare   Drug use: No     Allergies   Lyrica [pregabalin], Gabapentin, Nsaids, and Penicillins   Review of  Systems Review of Systems  Constitutional: Negative for chills and fever.  HENT: Negative for ear pain and sore throat.   Eyes: Negative for pain and visual disturbance.  Respiratory: Negative for cough and shortness of breath.   Cardiovascular: Negative for chest pain and palpitations.  Gastrointestinal: Negative for abdominal pain and vomiting.  Genitourinary: Negative for dysuria and hematuria.  Musculoskeletal: Positive for gait problem and myalgias. Negative for arthralgias and back pain.  Skin: Negative for color change and rash.  Neurological: Positive for numbness. Negative for seizures, syncope and weakness.  All other systems reviewed and are negative.    Physical Exam Triage Vital Signs ED Triage Vitals  Enc Vitals Group     BP      Pulse      Resp  Temp      Temp src      SpO2      Weight      Height      Head Circumference      Peak Flow      Pain Score      Pain Loc      Pain Edu?      Excl. in GC?    No data found.  Updated Vital Signs BP 132/87    Pulse (!) 103    Temp 99.9 F (37.7 C) (Oral)    Resp 16    Ht 5\' 6"  (1.676 m)    Wt 244 lb (110.7 kg)    SpO2 95%    BMI 39.38 kg/m   Visual Acuity Right Eye Distance:   Left Eye Distance:   Bilateral Distance:    Right Eye Near:   Left Eye Near:    Bilateral Near:     Physical Exam Vitals and nursing note reviewed.  Constitutional:      General: She is not in acute distress.    Appearance: She is well-developed. She is obese.  HENT:     Head: Normocephalic and atraumatic.     Mouth/Throat:     Mouth: Mucous membranes are moist.  Eyes:     Conjunctiva/sclera: Conjunctivae normal.  Cardiovascular:     Rate and Rhythm: Normal rate and regular rhythm.     Heart sounds: Normal heart sounds.  Pulmonary:     Effort: Pulmonary effort is normal. No respiratory distress.     Breath sounds: Normal breath sounds. No wheezing or rhonchi.  Abdominal:     Palpations: Abdomen is soft.      Tenderness: There is no abdominal tenderness.  Musculoskeletal:        General: Tenderness present. No deformity or signs of injury. Normal range of motion.     Cervical back: Neck supple.     Comments: Right inner thigh tender to palpation.  Skin:    General: Skin is warm and dry.     Findings: No bruising, erythema, lesion or rash.  Neurological:     General: No focal deficit present.     Mental Status: She is alert and oriented to person, place, and time.     Sensory: No sensory deficit.     Motor: No weakness.     Gait: Gait abnormal.     Comments: Limping gait favoring right leg.   Psychiatric:        Mood and Affect: Mood normal.        Behavior: Behavior normal.      UC Treatments / Results  Labs (all labs ordered are listed, but only abnormal results are displayed) Labs Reviewed - No data to display  EKG   Radiology No results found.  Procedures Procedures (including critical care time)  Medications Ordered in UC Medications - No data to display  Initial Impression / Assessment and Plan / UC Course  I have reviewed the triage vital signs and the nursing notes.  Pertinent labs & imaging results that were available during my care of the patient were reviewed by me and considered in my medical decision making (see chart for details).   Right thigh pain.  Sending patient for ultrasound to rule out DVT.  Patient states she is unable to go for this test today but can go tomorrow.  She is not short of breath and is in no respiratory distress.  I discussed with  her that I will call her after her ultrasound is complete with the results.  Discussed that she should go to the ED if she has acute shortness of breath.  Patient agrees to plan of care.   Final Clinical Impressions(s) / UC Diagnoses   Final diagnoses:  Right thigh pain     Discharge Instructions     [facility]        ED Prescriptions    None      I have reviewed the PDMP during this encounter.   Mickie Bailate, Nathanel Tallman H, NP 05/25/20 1537

## 2020-05-25 NOTE — Discharge Instructions (Addendum)
Go to [facility] radiology for the ultrasound of your right leg.

## 2020-05-25 NOTE — ED Triage Notes (Signed)
Pt reports having R thigh swelling and pain x1 week. Pain is from groin area to knee. Pt sts she may have pulled something in leg, but is unsure.  sts she have taken Norco without relief.

## 2020-05-26 ENCOUNTER — Ambulatory Visit
Admission: RE | Admit: 2020-05-26 | Discharge: 2020-05-26 | Disposition: A | Discharge status: Home / Self Care | Payer: 59 | Source: Ambulatory Visit | Attending: Emergency Medicine | Admitting: Emergency Medicine

## 2020-05-26 ENCOUNTER — Emergency Department: Payer: 59

## 2020-05-26 ENCOUNTER — Telehealth (HOSPITAL_COMMUNITY): Payer: Self-pay | Admitting: Family Medicine

## 2020-05-26 ENCOUNTER — Other Ambulatory Visit: Payer: Self-pay

## 2020-05-26 ENCOUNTER — Emergency Department
Admission: EM | Admit: 2020-05-26 | Discharge: 2020-05-26 | Disposition: A | Discharge status: Home / Self Care | Payer: 59 | Attending: Emergency Medicine | Admitting: Emergency Medicine

## 2020-05-26 ENCOUNTER — Telehealth (HOSPITAL_COMMUNITY): Payer: Self-pay | Admitting: Emergency Medicine

## 2020-05-26 ENCOUNTER — Other Ambulatory Visit: Payer: Self-pay | Admitting: Emergency Medicine

## 2020-05-26 ENCOUNTER — Encounter: Payer: Self-pay | Admitting: Emergency Medicine

## 2020-05-26 DIAGNOSIS — I82401 Acute embolism and thrombosis of unspecified deep veins of right lower extremity: Secondary | ICD-10-CM | POA: Insufficient documentation

## 2020-05-26 DIAGNOSIS — Z7901 Long term (current) use of anticoagulants: Secondary | ICD-10-CM | POA: Diagnosis not present

## 2020-05-26 DIAGNOSIS — I82411 Acute embolism and thrombosis of right femoral vein: Secondary | ICD-10-CM | POA: Insufficient documentation

## 2020-05-26 DIAGNOSIS — I82419 Acute embolism and thrombosis of unspecified femoral vein: Secondary | ICD-10-CM | POA: Insufficient documentation

## 2020-05-26 DIAGNOSIS — Z3A00 Weeks of gestation of pregnancy not specified: Secondary | ICD-10-CM | POA: Diagnosis not present

## 2020-05-26 DIAGNOSIS — E039 Hypothyroidism, unspecified: Secondary | ICD-10-CM | POA: Insufficient documentation

## 2020-05-26 DIAGNOSIS — Z794 Long term (current) use of insulin: Secondary | ICD-10-CM | POA: Diagnosis not present

## 2020-05-26 DIAGNOSIS — Z79899 Other long term (current) drug therapy: Secondary | ICD-10-CM | POA: Insufficient documentation

## 2020-05-26 DIAGNOSIS — O99419 Diseases of the circulatory system complicating pregnancy, unspecified trimester: Secondary | ICD-10-CM | POA: Diagnosis not present

## 2020-05-26 DIAGNOSIS — O2230 Deep phlebothrombosis in pregnancy, unspecified trimester: Secondary | ICD-10-CM | POA: Insufficient documentation

## 2020-05-26 DIAGNOSIS — R600 Localized edema: Secondary | ICD-10-CM | POA: Diagnosis not present

## 2020-05-26 DIAGNOSIS — I82811 Embolism and thrombosis of superficial veins of right lower extremities: Secondary | ICD-10-CM | POA: Diagnosis not present

## 2020-05-26 DIAGNOSIS — M79651 Pain in right thigh: Secondary | ICD-10-CM | POA: Diagnosis not present

## 2020-05-26 LAB — COMPREHENSIVE METABOLIC PANEL
ALT: 25 U/L (ref 0–44)
AST: 32 U/L (ref 15–41)
Albumin: 3.4 g/dL — ABNORMAL LOW (ref 3.5–5.0)
Alkaline Phosphatase: 94 U/L (ref 38–126)
Anion gap: 10 (ref 5–15)
BUN: 8 mg/dL (ref 6–20)
CO2: 25 mmol/L (ref 22–32)
Calcium: 8.6 mg/dL — ABNORMAL LOW (ref 8.9–10.3)
Chloride: 103 mmol/L (ref 98–111)
Creatinine, Ser: 0.68 mg/dL (ref 0.44–1.00)
GFR, Estimated: >60 mL/min (ref >60)
Glucose, Bld: 104 mg/dL — ABNORMAL HIGH (ref 70–99)
Potassium: 4.6 mmol/L (ref 3.5–5.1)
Sodium: 138 mmol/L (ref 135–145)
Total Bilirubin: 0.7 mg/dL (ref 0.3–1.2)
Total Protein: 7.6 g/dL (ref 6.5–8.1)

## 2020-05-26 LAB — PROTIME-INR
INR: 1 (ref 0.8–1.2)
Prothrombin Time: 12.8 seconds (ref 11.4–15.2)

## 2020-05-26 LAB — CBC
HCT: 29.8 % — ABNORMAL LOW (ref 36.0–46.0)
Hemoglobin: 9.1 g/dL — ABNORMAL LOW (ref 12.0–15.0)
MCH: 24.9 pg — ABNORMAL LOW (ref 26.0–34.0)
MCHC: 30.5 g/dL (ref 30.0–36.0)
MCV: 81.6 fL (ref 80.0–100.0)
Platelets: 411 10*3/uL — ABNORMAL HIGH (ref 150–400)
RBC: 3.65 MIL/uL — ABNORMAL LOW (ref 3.87–5.11)
RDW: 17.3 % — ABNORMAL HIGH (ref 11.5–15.5)
WBC: 9.2 10*3/uL (ref 4.0–10.5)
nRBC: 0 % (ref 0.0–0.2)

## 2020-05-26 LAB — APTT: aPTT: 24 seconds (ref 24–36)

## 2020-05-26 MED ORDER — HYDROCODONE-ACETAMINOPHEN 7.5-300 MG PO TABS
1.0000 | ORAL_TABLET | Freq: Four times a day (QID) | ORAL | 0 refills | Status: AC | PRN
Start: 2020-05-26 — End: 2020-05-31

## 2020-05-26 MED ORDER — IOHEXOL 350 MG/ML SOLN
100.0000 mL | Freq: Once | INTRAVENOUS | Status: AC | PRN
  Administered 2020-05-26: 100 mL via INTRAVENOUS
  Filled 2020-05-26: qty 100

## 2020-05-26 MED ORDER — ONDANSETRON HCL 4 MG/2ML IJ SOLN: 4.0000 mg | Freq: Once | INTRAMUSCULAR | Status: AC

## 2020-05-26 MED ORDER — MORPHINE SULFATE (PF) 2 MG/ML IV SOLN: 2.0000 mg | Freq: Once | INTRAVENOUS | Status: AC

## 2020-05-26 MED ORDER — MORPHINE SULFATE (PF) 2 MG/ML IV SOLN
INTRAVENOUS | Status: AC
  Administered 2020-05-26: 2 mg via INTRAVENOUS
  Filled 2020-05-26: qty 1

## 2020-05-26 MED ORDER — ONDANSETRON HCL 4 MG/2ML IJ SOLN
INTRAMUSCULAR | Status: AC
  Administered 2020-05-26: 4 mg via INTRAVENOUS
  Filled 2020-05-26: qty 2

## 2020-05-26 MED ORDER — APIXABAN 5 MG PO TABS: ORAL_TABLET | ORAL | 1 refills | Status: DC

## 2020-05-26 NOTE — Telephone Encounter (Signed)
Received call from [facility] asking to speak to Michaela Jones to review results for this patient.  Michaela Jones not working this morning, so I forwarded to Dr. Delton See.  After review, patient will need to follow up in ER for CT scan and further work-up.  Contacted patient by phone, who verified this was reviewed with her by the provider on site, and she is headed home so her husband can pick her up to take her.  No questions at this time, verbalized understanding.

## 2020-05-26 NOTE — ED Provider Notes (Signed)
[facility] Emergency Department Provider Note   ____________________________________________    I have reviewed the triage vital signs and the nursing notes.   HISTORY  Chief Complaint Leg Pain     HPI Michaela Jones is a 44 y.o. female with a history of lymphedema in the legs who presents with complaints of right thigh pain.  Patient went to urgent care yesterday because of nearly a week of right thigh pain which seem to be getting worse despite taking pain medications.  Denies pleurisy or chest pain.  Had an ultrasound done today which demonstrated a saphenous vein blood clot extending into the femoral vein, recommendation by radiology for CT venogram.  No history of blood clots but mother had a blood clot  Past Medical History:  Diagnosis Date  . Anemia   . Chronic back pain   . Complication of anesthesia    HARD TO WAKE UP/OXYGEN LEVEL DROPPED AFTER BACK SURGERY X1  . Constipation, chronic   . Depression   . Edema   . History of seasonal allergies    pt has inhalers for this if needed  . Hypotension   . Hypothyroid   . Insomnia   . Lymphedema of both lower extremities   . Migraine   . Restless leg syndrome   . Vitamin D deficiency     Patient Active Problem List   Diagnosis Date Noted  . Chronic pain disorder 05/16/2020  . Chronic, continuous use of opioids 05/16/2020  . Anxiety 06/17/2018  . Lumbar postlaminectomy syndrome 10/23/2017  . Lumbosacral radiculopathy 10/23/2017  . Vitamin D deficiency 03/01/2015  . Acquired spondylolisthesis 03/01/2015  . Elevated LDL cholesterol level 03/01/2015  . Chronic constipation 03/01/2015  . Dysthymia 11/11/2013  . Absolute anemia 10/15/2012  . Gastric bypass status for obesity 10/15/2012  . Insomnia 09/03/2007  . Allergic rhinitis 06/15/2007  . Iron deficiency anemia 01/22/2007  . Restless leg 09/23/2006  . Headache, migraine 08/27/2006  . Acquired lymphedema 07/29/1998  . Adult  hypothyroidism 07/29/1992    Past Surgical History:  Procedure Laterality Date  . ABDOMINOPLASTY  2004  . ABLATION  2014  . BACK SURGERY  10/2012  . BREAST ENHANCEMENT SURGERY  07/2008  . BREAST SURGERY  09/05/2011   removal of implants  . CHOLECYSTECTOMY N/A 04/16/2016   Procedure: LAPAROSCOPIC CHOLECYSTECTOMY WITH INTRAOPERATIVE CHOLANGIOGRAM;  Surgeon: Earline Mayotte, MD;  Location: [facility] ORS;  Service: General;  Laterality: N/A;  . GASTRIC BYPASS  2002  . Lower body  01/2008, 03/2008   lift legs and thighs    Prior to Admission medications   Medication Sig Start Date End Date Taking? Authorizing Provider  albuterol (PROVENTIL HFA;VENTOLIN HFA) 108 (90 Base) MCG/ACT inhaler Inhale 2 puffs into the lungs every 6 (six) hours as needed for wheezing or shortness of breath.    [provider]  albuterol (PROVENTIL) (5 MG/ML) 0.5% nebulizer solution Inhale into the lungs.    [provider]  ALPRAZolam Prudy Feeler) 1 MG tablet TAKE 1/2 TO 1 TABLET BY MOUTH THREE TIMES DAILY AS NEEDED ANXIETY 05/26/20   Corie Chiquito, PMHNP  apixaban (ELIQUIS) 5 MG TABS tablet Take 2 tablets (10mg ) twice daily for 7 days, then 1 tablet (5mg ) twice daily 05/26/20   , MD  Armodafinil 150 MG tablet Take 1 tablet (150 mg total) by mouth every morning. 10/06/19 11/05/19  12/06/19, PMHNP  brexpiprazole (REXULTI) 1 MG TABS tablet Take 1 tablet (1 mg total) by mouth  daily. Patient not taking: Reported on 05/16/2020 02/17/20 05/17/20  Corie Chiquito, PMHNP  cetirizine (ZYRTEC) 10 MG tablet Take 10 mg by mouth daily as needed for allergies.    [provider]  Cholecalciferol (VITAMIN D) 50 MCG (2000 UT) CAPS Take by mouth.    [provider]  cyanocobalamin (,VITAMIN B-12,) 1000 MCG/ML injection INJECT 1 MILLILITER (ML) ONCE MONTHLY 04/07/20   Trey Sailors, PA-C  cyclobenzaprine (FLEXERIL) 10 MG tablet Take 1 tablet (10 mg total) by mouth 3 (three) times daily  as needed for muscle spasms. Patient not taking: Reported on 04/27/2020 04/07/20   Trey Sailors, PA-C  fluticasone [facility]) 50 MCG/ACT nasal spray Place 2 sprays into both nostrils daily. 12/17/17   Trey Sailors, PA-C  HYDROcodone-Acetaminophen 7.5-300 MG TABS Take 1 tablet by mouth every 6 (six) hours as needed for up to 5 days (pain). 05/26/20 05/31/20  Jene Every, MD  Insulin Pen Needle (NOVOFINE PEN NEEDLE) 32G X 6 MM MISC Use daily with saxenda. 04/27/20   Trey Sailors, PA-C  Lemborexant (DAYVIGO) 10 MG TABS Take 1 tablet by mouth at bedtime. Patient not taking: Reported on 05/16/2020 03/06/20   Corie Chiquito, PMHNP  levETIRAcetam (KEPPRA) 1000 MG tablet Take 1 tablet (1,000 mg total) by mouth 2 (two) times daily. Patient not taking: Reported on 05/16/2020 04/27/20 10/24/20  Trey Sailors, PA-C  levothyroxine (SYNTHROID) 125 MCG tablet Take 1 tablet (125 mcg total) by mouth daily. 04/19/20   Trey Sailors, PA-C  Liraglutide -Weight Management (SAXENDA) 18 MG/3ML SOPN Inject 0.6 mg into the skin daily. 04/27/20   Trey Sailors, PA-C  meloxicam (MOBIC) 15 MG tablet Take 1 tablet (15 mg total) by mouth daily. 04/27/20   Trey Sailors, PA-C  Multiple Vitamin (MULTIVITAMIN) tablet Take 1 tablet by mouth daily.    [provider]     Allergies Lyrica [pregabalin], Gabapentin, Nsaids, and Penicillins  Family History  Problem Relation Age of Onset  . Pulmonary embolism Mother   . Depression Mother   . Asthma Mother   . Heart attack Father   . Cancer Father     Social History Social History   Tobacco Use  . Smoking status: Never Smoker  . Smokeless tobacco: Never Used  Substance Use Topics  . Alcohol use: Yes    Alcohol/week: 0.0 standard drinks    Comment: Rare  . Drug use: No    Review of Systems  Constitutional: No fever/chills Eyes: No visual changes.  ENT: No sore throat. Cardiovascular: No chest pain Respiratory: No pleurisy shortness  of breath Gastrointestinal: No abdominal pain.   Genitourinary: Negative for dysuria. Musculoskeletal: As above Skin: Negative for rash. Neurological: Negative for headaches or weakness   ____________________________________________   PHYSICAL EXAM:  VITAL SIGNS: ED Triage Vitals  Enc Vitals Group     BP 05/26/20 1117 126/81     Pulse Rate 05/26/20 1117 91     Resp 05/26/20 1117 16     Temp 05/26/20 1117 98.6 F (37 C)     Temp Source 05/26/20 1117 Oral     SpO2 05/26/20 1117 100 %     Weight 05/26/20 1118 110.7 kg (244 lb)     Height 05/26/20 1118 1.676 m (5\' 6" )     Head Circumference --      Peak Flow --      Pain Score 05/26/20 1118 8     Pain Loc --  Pain Edu? --      Excl. in GC? --     Constitutional: Alert and oriented.  Nose: No congestion/rhinnorhea. Mouth/Throat: Mucous membranes are moist.   Neck:  Painless ROM Cardiovascular: Normal rate, regular rhythm.   Good peripheral circulation. Respiratory: Normal respiratory effort.  No retractions Gastrointestinal: Soft and nontender. No distention.   musculoskeletal: Tenderness along the right medial thigh, extremity is warm and well perfused Neurologic:  Normal speech and language. No gross focal neurologic deficits are appreciated.  Skin:  Skin is warm, dry and intact. No rash noted. Psychiatric: Mood and affect are normal. Speech and behavior are normal.  ____________________________________________   LABS (all labs ordered are listed, but only abnormal results are displayed)  Labs Reviewed  CBC - Abnormal; Notable for the following components:      Result Value   RBC 3.65 (*)    Hemoglobin 9.1 (*)    HCT 29.8 (*)    MCH 24.9 (*)    RDW 17.3 (*)    Platelets 411 (*)    All other components within normal limits  COMPREHENSIVE METABOLIC PANEL - Abnormal; Notable for the following components:   Glucose, Bld 104 (*)    Calcium 8.6 (*)    Albumin 3.4 (*)    All other components within normal  limits  APTT  PROTIME-INR   ____________________________________________  EKG  None ____________________________________________  RADIOLOGY  CT venogram reviewed by me ____________________________________________   PROCEDURES  Procedure(s) performed: No  Procedures   Critical Care performed: No ____________________________________________   INITIAL IMPRESSION / ASSESSMENT AND PLAN / ED COURSE  Pertinent labs & imaging results that were available during my care of the patient were reviewed by me and considered in my medical decision making (see chart for details).  Patient presents with ultrasound proven SVT extending into DVT into the femoral vein, question whether extending into the iliacs, will obtain CT venogram, basic labs.  Lab work with mild anemia, otherwise unremarkable.  Ct venogram is reassuring. Will start patient on eliquis, outpatient f/u with Dr. Wyn Quaker.    ____________________________________________   FINAL CLINICAL IMPRESSION(S) / ED DIAGNOSES  Final diagnoses:  DVT (deep vein thrombosis) in pregnancy        Note:  This document was prepared using Dragon voice recognition software and may include unintentional dictation errors.   Jene Every, MD 05/26/20 1944

## 2020-05-26 NOTE — ED Triage Notes (Signed)
Seen at [facility] yesterday and had ultrasound today of lower extremity right.  They found blood clots and sent the patient here for ?CT.  Says she is not short of breath.

## 2020-05-26 NOTE — Telephone Encounter (Signed)
Patient was sent for an ultrasound of her leg to rule out DVT.  It is positive.  She has a clot of the superficial greater saphenous vein that is extensive and it continues up into the common femoral vein ( deep system).  She needs further workup with a CT venogram abdomen and pelvis.   This cannot be done through the urgent care so patient will be called to go to the ER for additional management.

## 2020-05-26 NOTE — Telephone Encounter (Signed)
Next apt 12/02 

## 2020-05-26 NOTE — ED Notes (Signed)
POCT URINE NEGATIVE. GLUCOMETER NO ACCESS.

## 2020-05-26 NOTE — Telephone Encounter (Signed)
Discussed ultrasound results with patient and instructed her to go to the ED. She agrees and will have her husband drive her to [facility] ED.

## 2020-05-27 NOTE — Patient Instructions (Signed)
.   Please review the attached list of medications and notify my office if there are any errors.   . Please bring all of your medications to every appointment so we can make sure that our medication list is the same as yours.   

## 2020-05-30 ENCOUNTER — Telehealth: Payer: Self-pay | Admitting: Physician Assistant

## 2020-05-30 ENCOUNTER — Encounter: Payer: Self-pay | Admitting: Family Medicine

## 2020-05-30 NOTE — Telephone Encounter (Signed)
Pt has mychart video visit with dr Sherrie Mustache on 06-16-2020. Pt states dr Sherrie Mustache is her pcp. Pt went to [facility] on 05-26-2020 and was dx with blood clots in her right thigh. Pt is on blood thinners and pain medications. Pt was given hydrocodone 7.5-325 mg from er doctor. Er doctor increase hydrocodone to 4 times a day.

## 2020-05-31 ENCOUNTER — Encounter: Payer: Self-pay | Admitting: Family Medicine

## 2020-06-07 ENCOUNTER — Ambulatory Visit: Payer: Self-pay | Admitting: Physician Assistant

## 2020-06-16 ENCOUNTER — Other Ambulatory Visit: Payer: Self-pay | Admitting: Family Medicine

## 2020-06-16 ENCOUNTER — Encounter: Payer: Self-pay | Admitting: Family Medicine

## 2020-06-16 ENCOUNTER — Telehealth: Payer: Self-pay | Admitting: Physician Assistant

## 2020-06-16 ENCOUNTER — Telehealth (INDEPENDENT_AMBULATORY_CARE_PROVIDER_SITE_OTHER): Payer: 59 | Admitting: Family Medicine

## 2020-06-16 DIAGNOSIS — M961 Postlaminectomy syndrome, not elsewhere classified: Secondary | ICD-10-CM | POA: Diagnosis not present

## 2020-06-16 DIAGNOSIS — F1190 Opioid use, unspecified, uncomplicated: Secondary | ICD-10-CM | POA: Diagnosis not present

## 2020-06-16 DIAGNOSIS — I82411 Acute embolism and thrombosis of right femoral vein: Secondary | ICD-10-CM

## 2020-06-16 MED ORDER — APIXABAN 5 MG PO TABS: 5.0000 mg | ORAL_TABLET | Freq: Two times a day (BID) | ORAL | 2 refills | Status: DC

## 2020-06-16 MED ORDER — HYDROCODONE-ACETAMINOPHEN 10-325 MG PO TABS: 1.0000 | ORAL_TABLET | Freq: Three times a day (TID) | ORAL | 0 refills | Status: DC | PRN

## 2020-06-16 NOTE — Telephone Encounter (Signed)
Patient is unable to get on for video video. Unable to call Office. Closed for lunch until 1:20p Please advise [phone]

## 2020-06-16 NOTE — Telephone Encounter (Signed)
Provider did telephone visit.

## 2020-06-16 NOTE — Progress Notes (Signed)
MyChart Video Visit    Virtual Visit via Video Note   This visit type was conducted due to national recommendations for restrictions regarding the COVID-19 Pandemic (e.g. social distancing) in an effort to limit this patient's exposure and mitigate transmission in our community. This patient is at least at moderate risk for complications without adequate follow up. This format is felt to be most appropriate for this patient at this time. Physical exam was limited by quality of the video and audio technology used for the visit.   Patient location: work Provider location: [facility]  I discussed the limitations of evaluation and management by telemedicine and the availability of in person appointments. The patient expressed understanding and agreed to proceed.  Patient: Michaela Jones   DOB: August 03, 1975   44 y.o. Female  MRN: [id number] Visit Date: 06/16/2020  Today's healthcare provider: [name], MD   Chief Complaint  Patient presents with  . Back Pain  . Follow-up   Subjective    HPI  Follow up for Chronic low back pain:  The patient was last seen for this 05/16/2020.   During that visit patient was advised that the hydrocodone/apap would be filled at no more than an average of 20 MMEs per day, which be about 2 or occasionally 3 of the 7.5/325mg  tablets per day. Patient was advised to follow up in 1 month. Consider adjuvant pain medications if she not being well maintained on this regiment. Since last visit, patient was seen in the ER on 05/25/2020 and 05/26/2020 and was found to have a DVT. Patient was started on eliquis, and advised outpatient f/u with Dr. Wyn Quaker. Hydrocodone was temporarily increased to every 6 hours as needed.   She reports good compliance with treatment. She feels that condition is Unchanged. She is not having side effects.    -----------------------------------------------------------------------------------------  Follow up ER visit  Patient was seen in ER  for right thigh pain on 05/25/2020 and 05/26/2020. She was treated for femoral DVT. Treatment for this included starting on eliquis. Patient was advised outpatient f/u with Dr. Wyn Quaker. She reports good compliance with treatment. Patient doesn't have an appointment with Dr. Wyn Quaker yet. She states the ER told her to have her PCP make the referral.   She is followed by Dr. Orlie Dakin for chronic anemia and happens to have previously scheduled appt with him on Monday 06-19-2020.   -----------------------------------------------------------------------------------------      Medications: Outpatient Medications Prior to Visit  Medication Sig  . albuterol (PROVENTIL HFA;VENTOLIN HFA) 108 (90 Base) MCG/ACT inhaler Inhale 2 puffs into the lungs every 6 (six) hours as needed for wheezing or shortness of breath.  Marland Kitchen albuterol (PROVENTIL) (5 MG/ML) 0.5% nebulizer solution Inhale into the lungs.  . ALPRAZolam (XANAX) 1 MG tablet TAKE 1/2 TO 1 TABLET BY MOUTH THREE TIMES DAILY AS NEEDED ANXIETY  . apixaban (ELIQUIS) 5 MG TABS tablet Take 2 tablets (10mg ) twice daily for 7 days, then 1 tablet (5mg ) twice daily  . cetirizine (ZYRTEC) 10 MG tablet Take 10 mg by mouth daily as needed for allergies.  . Cholecalciferol (VITAMIN D) 50 MCG (2000 UT) CAPS Take by mouth.  . cyanocobalamin (,VITAMIN B-12,) 1000 MCG/ML injection INJECT 1 MILLILITER (ML) ONCE MONTHLY  . fluticasone (FLONASE) 50 MCG/ACT nasal spray Place 2 sprays into both nostrils daily.  . Insulin Pen Needle (NOVOFINE PEN NEEDLE) 32G X 6 MM MISC Use daily with saxenda.  . levothyroxine (SYNTHROID) 125 MCG tablet Take 1 tablet (125 mcg total)  by mouth daily.  . Liraglutide -Weight Management (SAXENDA) 18 MG/3ML SOPN Inject 0.6 mg into the skin daily.  . meloxicam (MOBIC) 15 MG tablet Take 1 tablet (15 mg total) by mouth daily.  . Multiple Vitamin (MULTIVITAMIN) tablet Take 1 tablet by mouth daily.  . Armodafinil 150 MG tablet Take 1 tablet (150 mg total)  by mouth every morning.  . brexpiprazole (REXULTI) 1 MG TABS tablet Take 1 tablet (1 mg total) by mouth daily. (Patient not taking: Reported on 05/16/2020)  . cyclobenzaprine (FLEXERIL) 10 MG tablet Take 1 tablet (10 mg total) by mouth 3 (three) times daily as needed for muscle spasms. (Patient not taking: Reported on 04/27/2020)  . Lemborexant (DAYVIGO) 10 MG TABS Take 1 tablet by mouth at bedtime. (Patient not taking: Reported on 05/16/2020)  . levETIRAcetam (KEPPRA) 1000 MG tablet Take 1 tablet (1,000 mg total) by mouth 2 (two) times daily. (Patient not taking: Reported on 05/16/2020)   No facility-administered medications prior to visit.    Review of Systems  Constitutional: Negative for appetite change, chills, fatigue and fever.  Respiratory: Negative for chest tightness and shortness of breath.   Cardiovascular: Negative for chest pain and palpitations.  Gastrointestinal: Negative for abdominal pain, nausea and vomiting.  Musculoskeletal: Positive for back pain.  Neurological: Negative for dizziness and weakness.      Objective    There were no vitals taken for this visit.   Physical Exam   Awake, alert, oriented x 3. In no apparent distress   Assessment & Plan     1. Deep venous thrombosis of right profunda femoris vein (HCC) No clear precipitating event, but she does have chronic lymphedema. Now on maintenance dose of eliquis which was initially prescribed 05/26/2020; refill- apixaban (ELIQUIS) 5 MG TABS tablet; Take 1 tablet (5 mg total) by mouth 2 (two) times daily.  Dispense: 60 tablet; Refill: 2 - HYDROcodone-acetaminophen (NORCO) 10-325 MG tablet; Take 1 tablet by mouth every 8 (eight) hours as needed.  Dispense: 90 tablet; Refill: 0  She is to advise Dr. Orlie Dakin about DVT at her previously scheduled appt on 06/19/2020. Will hold off on vascular referral until she sees Dr. Orlie Dakin  2. Chronic, continuous use of opioids Off high dose opioids and had been doing well  on 7.5/325 hydrocodone/apap until she developed femoral DVT. Will increase to- HYDROcodone-acetaminophen (NORCO) 10-325 MG tablet; Take 1 tablet by mouth every 8 (eight) hours as needed.  Dispense: 90 tablet; Refill: 0 Anticipate return to 7.5/325 BID with next refill  3. Lumbar postlaminectomy syndrome  - HYDROcodone-acetaminophen (NORCO) 10-325 MG tablet; Take 1 tablet by mouth every 8 (eight) hours as needed.  Dispense: 90 tablet; Refill: 0       I discussed the assessment and treatment plan with the patient. The patient was provided an opportunity to ask questions and all were answered. The patient agreed with the plan and demonstrated an understanding of the instructions.   The patient was advised to call back or seek an in-person evaluation if the symptoms worsen or if the condition fails to improve as anticipated.  I provided 12 minutes of non-face-to-face time during this encounter.  The entirety of the information documented in the History of Present Illness, Review of Systems and Physical Exam were personally obtained by me. Portions of this information were initially documented by the CMA and reviewed by me for thoroughness and accuracy.     [name], MD [facility] [phone] (phone) [phone] (fax)  [facility]

## 2020-06-17 NOTE — Progress Notes (Signed)
[facility]  Telephone:([phone]) [phone] Fax:(336) [phone]  ID: Michaela Jones OB: [birth year]  MR#: [id number]  JOI#:[id number]  Patient Care Team: Maryella Shivers as PCP - General (Physician Assistant) Anola Gurney, PA as Referring Physician (Family Medicine) Lemar Livings Merrily Pew, MD (General Surgery)  CHIEF COMPLAINT: Iron deficiency anemia, right leg DVT.  INTERVAL HISTORY: Patient last evaluated in clinic in 2014.  She is referred back for declining hemoglobin and iron stores as well as an unprovoked right leg DVT.  She has noticed increased weakness and fatigue.  She also continues to have chronic back pain.  She otherwise feels well.  She has no neurologic complaints.  She denies any recent fevers or illnesses.  She has a good appetite and denies weight loss.  She has no chest pain, shortness of breath, cough, or hemoptysis.  She denies any nausea, vomiting, constipation, or diarrhea.  She has no melena or hematochezia.  She has no urinary complaints.  Patient otherwise feels well and offers no further specific complaints today.  REVIEW OF SYSTEMS:   Review of Systems  Constitutional: Positive for malaise/fatigue. Negative for fever and weight loss.  Respiratory: Negative.  Negative for cough, hemoptysis and shortness of breath.   Cardiovascular: Negative.  Negative for chest pain and leg swelling.  Gastrointestinal: Negative.  Negative for abdominal pain, blood in stool and melena.  Genitourinary: Negative.  Negative for hematuria.  Musculoskeletal: Positive for back pain.  Skin: Negative for rash.  Neurological: Positive for weakness. Negative for dizziness, focal weakness and headaches.  Psychiatric/Behavioral: Negative.  The patient is not nervous/anxious.     As per HPI. Otherwise, a complete review of systems is negative.  PAST MEDICAL HISTORY: Past Medical History:  Diagnosis Date  . Anemia   . Chronic back pain   . Complication of anesthesia     HARD TO WAKE UP/OXYGEN LEVEL DROPPED AFTER BACK SURGERY X1  . Constipation, chronic   . Depression   . Edema   . History of seasonal allergies    pt has inhalers for this if needed  . Hypotension   . Hypothyroid   . Insomnia   . Lymphedema of both lower extremities   . Migraine   . Restless leg syndrome   . Vitamin D deficiency     PAST SURGICAL HISTORY: Past Surgical History:  Procedure Laterality Date  . ABDOMINOPLASTY  2004  . ABLATION  2014  . BACK SURGERY  10/2012  . BREAST ENHANCEMENT SURGERY  07/2008  . BREAST SURGERY  09/05/2011   removal of implants  . CHOLECYSTECTOMY N/A 04/16/2016   Procedure: LAPAROSCOPIC CHOLECYSTECTOMY WITH INTRAOPERATIVE CHOLANGIOGRAM;  Surgeon: Earline Mayotte, MD;  Location: [facility] ORS;  Service: General;  Laterality: N/A;  . GASTRIC BYPASS  2002  . Lower body  01/2008, 03/2008   lift legs and thighs    FAMILY HISTORY: Family History  Problem Relation Age of Onset  . Pulmonary embolism Mother   . Depression Mother   . Asthma Mother   . Heart attack Father   . Cancer Father     ADVANCED DIRECTIVES (Y/N):  N  HEALTH MAINTENANCE: Social History   Tobacco Use  . Smoking status: Never Smoker  . Smokeless tobacco: Never Used  Substance Use Topics  . Alcohol use: Yes    Alcohol/week: 0.0 standard drinks    Comment: Rare  . Drug use: No     Colonoscopy:  PAP:  Bone density:  Lipid panel:  Allergies  Allergen Reactions  . Lyrica [Pregabalin] Other (See Comments)    Patient reports neurological problem. Speech disturbances and unable to control motor skills  . Gabapentin Swelling  . Nsaids Other (See Comments)    Avoids due to gastric bypass  . Penicillins Rash    Current Outpatient Medications  Medication Sig Dispense Refill  . ALPRAZolam (XANAX) 1 MG tablet TAKE 1/2 TO 1 TABLET BY MOUTH THREE TIMES DAILY AS NEEDED ANXIETY 90 tablet 2  . apixaban (ELIQUIS) 5 MG TABS tablet Take 1 tablet (5 mg total) by mouth 2 (two)  times daily. 60 tablet 2  . Armodafinil 150 MG tablet Take 1 tablet (150 mg total) by mouth every morning. 30 tablet 2  . cetirizine (ZYRTEC) 10 MG tablet Take 10 mg by mouth daily as needed for allergies.    . Cholecalciferol (VITAMIN D) 50 MCG (2000 UT) CAPS Take by mouth.    . cyanocobalamin (,VITAMIN B-12,) 1000 MCG/ML injection INJECT 1 MILLILITER (ML) ONCE MONTHLY 6 mL 4  . HYDROcodone-acetaminophen (NORCO) 10-325 MG tablet Take 1 tablet by mouth every 8 (eight) hours as needed. 90 tablet 0  . Insulin Pen Needle (NOVOFINE PEN NEEDLE) 32G X 6 MM MISC Use daily with saxenda. 100 each 0  . Lemborexant (DAYVIGO) 10 MG TABS Take 1 tablet by mouth at bedtime. 30 tablet 2  . levothyroxine (SYNTHROID) 125 MCG tablet Take 1 tablet (125 mcg total) by mouth daily. 90 tablet 0  . Liraglutide -Weight Management (SAXENDA) 18 MG/3ML SOPN Inject 0.6 mg into the skin daily. 3 mL 2  . Multiple Vitamin (MULTIVITAMIN) tablet Take 1 tablet by mouth daily.    . promethazine (PHENERGAN) 25 MG tablet Take by mouth.    Marland Kitchen albuterol (PROVENTIL HFA;VENTOLIN HFA) 108 (90 Base) MCG/ACT inhaler Inhale 2 puffs into the lungs every 6 (six) hours as needed for wheezing or shortness of breath. (Patient not taking: Reported on 06/20/2020)    . albuterol (PROVENTIL) (5 MG/ML) 0.5% nebulizer solution Inhale into the lungs. (Patient not taking: Reported on 06/20/2020)    . brexpiprazole (REXULTI) 1 MG TABS tablet Take 1 tablet (1 mg total) by mouth daily. (Patient not taking: Reported on 05/16/2020) 90 tablet 1  . cyclobenzaprine (FLEXERIL) 10 MG tablet Take 1 tablet (10 mg total) by mouth 3 (three) times daily as needed for muscle spasms. (Patient not taking: Reported on 04/27/2020) 30 tablet 0  . fluticasone (FLONASE) 50 MCG/ACT nasal spray Place 2 sprays into both nostrils daily. (Patient not taking: Reported on 06/20/2020) 16 g 6  . levETIRAcetam (KEPPRA) 1000 MG tablet Take 1 tablet (1,000 mg total) by mouth 2 (two) times  daily. (Patient not taking: Reported on 05/16/2020) 180 tablet 1  . meloxicam (MOBIC) 15 MG tablet Take 1 tablet (15 mg total) by mouth daily. (Patient not taking: Reported on 06/20/2020) 30 tablet 0   No current facility-administered medications for this visit.    OBJECTIVE: Vitals:   06/20/20 1356  BP: 131/74  Pulse: (!) 103  Temp: 98.3 F (36.8 C)  SpO2: 99%     Body mass index is 42.3 kg/m.    ECOG FS:0 - Asymptomatic  General: Well-developed, well-nourished, no acute distress. Eyes: Pink conjunctiva, anicteric sclera. HEENT: Normocephalic, moist mucous membranes. Lungs: No audible wheezing or coughing. Heart: Regular rate and rhythm. Abdomen: Soft, nontender, no obvious distention. Musculoskeletal: Bilateral lower extremity lymphedema. Neuro: Alert, answering all questions appropriately. Cranial nerves grossly intact. Skin: No rashes or petechiae noted. Psych: Normal affect.  Lymphatics: No cervical, calvicular, axillary or inguinal LAD.   LAB RESULTS:  Lab Results  Component Value Date   NA 138 05/26/2020   K 4.6 05/26/2020   CL 103 05/26/2020   CO2 25 05/26/2020   GLUCOSE 104 (H) 05/26/2020   BUN 8 05/26/2020   CREATININE 0.68 05/26/2020   CALCIUM 8.6 (L) 05/26/2020   PROT 7.6 05/26/2020   ALBUMIN 3.4 (L) 05/26/2020   AST 32 05/26/2020   ALT 25 05/26/2020   ALKPHOS 94 05/26/2020   BILITOT 0.7 05/26/2020   GFRNONAA >60 05/26/2020   GFRAA 102 04/07/2020    Lab Results  Component Value Date   WBC 8.4 06/20/2020   NEUTROABS 4.8 06/20/2020   HGB 9.8 (L) 06/20/2020   HCT 33.7 (L) 06/20/2020   MCV 81.6 06/20/2020   PLT 341 06/20/2020   Lab Results  Component Value Date   IRON 29 04/07/2020   TIBC 496 (H) 04/07/2020   IRONPCTSAT 6 (LL) 04/07/2020   Lab Results  Component Value Date   FERRITIN 8 (L) 04/07/2020     STUDIES: CT ANGIO AO+BIFEM W & OR WO CONTRAST  Result Date: 05/26/2020 CLINICAL DATA:  44 year old with right thigh swelling for 1  week. Venous duplex demonstrates right lower extremity DVT and SVT. CT venogram ordered to exclude clot extension into the right iliac veins. EXAM: CT ABDOMEN, PELVIS AND LOWER EXTREMITIES WITH CONTRAST (CT VENOGRAM) TECHNIQUE: Multidetector CT imaging of the abdomen and pelvis and lower extremities was performed using the CT venogram protocol following bolus administration of intravenous contrast. CONTRAST:  OMNIPAQUE IOHEXOL 350 MG/ML SOLN COMPARISON:  Lower extremity venous duplex 05/26/2020 FINDINGS: Lower chest: Lung bases are clear.  Negative for pleural effusions. Hepatobiliary: No focal liver lesion. Difficult to exclude fatty infiltration. The gallbladder has been removed. The main portal venous system is patent. No biliary dilatation. Pancreas: Unremarkable. No pancreatic ductal dilatation or surrounding inflammatory changes. Spleen: Normal in size without focal abnormality. Adrenals/Urinary Tract: Normal appearance of the adrenal glands. Normal appearance of both kidneys without a suspicious lesion or hydronephrosis. Possible small cyst in the right kidney interpolar region. Normal appearance of the urinary bladder. Stomach/Bowel: Postsurgical changes compatible with a gastric bypass procedure. Cecum is in the anterior right abdomen and there appears to be a normal appendix. No evidence for acute bowel inflammation or bowel obstruction. Vascular/Lymphatic: Normal caliber of the abdominal aorta without significant atherosclerotic disease. Edema and inflammatory changes surrounding the right great saphenous vein compatible with known superficial thrombus in this area. There appears to be nonocclusive filling defect in the right common femoral vein corresponding with the recent ultrasound findings. No evidence for thrombus in the right femoral vein or right popliteal vein and this corresponds with the recent ultrasound findings. There is no evidence of thrombus extending into the right iliac veins or  IVC. Renal veins are patent. Left iliac veins are patent. No evidence of thrombus in the left common femoral vein or left femoral veins. No definite thrombus in the left popliteal vein. Reproductive: Uterus and bilateral adnexa are unremarkable. Other: Negative for ascites. Probable small ventral hernia containing fat along the anterior abdominal wall. Musculoskeletal: Subcutaneous edema involving the medial right thigh adjacent to the right great saphenous vein and corresponding with the known superficial thrombus in this area. Subcutaneous edema in the upper calves bilaterally. Scattered areas of superficial edema in the pelvis and gluteal regions. Some of these subcutaneous findings could be related to previous surgery. Surgical interbody fusion at L5-S1.  IMPRESSION: 1. No evidence for thrombus extending into the right iliac veins or IVC. 2. Again noted is nonocclusive thrombus involving the right common femoral vein and thrombus involving the right great saphenous vein. These findings correspond with the recent ultrasound examinations. 3. Subcutaneous edema in the pelvis and lower extremities. Focal subcutaneous edema in the right medial thigh related to the superficial thrombophlebitis. 4. Postoperative changes in the abdomen. Electronically Signed   By: Richarda OverlieAdam  Henn M.D.   On: 05/26/2020 17:17   US Venous Img Lower Unilateral Right (DVT)  Result Date: 05/26/2020 CLINICAL DATA:  44 year old female with pain in the right interpolar and lower thigh. EXAM: RIGHT LOWER EXTREMITY VENOUS DOPPLER ULTRASOUND TECHNIQUE: Gray-scale sonography with graded compression, as well as color Doppler and duplex ultrasound were performed to evaluate the right lower extremity deep venous systems from the level of the common femoral vein and including the common femoral, femoral, profunda femoral, popliteal and calf veins including the posterior tibial, peroneal and gastrocnemius veins when visible. Spectral Doppler was utilized  to evaluate flow at rest and with distal augmentation maneuvers in the common femoral, femoral and popliteal veins. The contralateral common femoral vein was also evaluated for comparison. COMPARISON:  None. FINDINGS: RIGHT LOWER EXTREMITY Common Femoral Vein: Noncompressible with hypoechoic thrombus, nonocclusive. Central Greater Saphenous Vein: Noncompressible with occlusive hypoechoic thrombus. Central Profunda Femoral Vein: No evidence of thrombus. Normal compressibility and flow on color Doppler imaging. Femoral Vein: No evidence of thrombus. Normal compressibility, respiratory phasicity and response to augmentation. Popliteal Vein: No evidence of thrombus. Normal compressibility, respiratory phasicity and response to augmentation. Calf Veins: Poorly visualized. Other Findings:  Subcutaneous edema noted in the right calf. LEFT LOWER EXTREMITY Common Femoral Vein: No evidence of thrombus. Normal compressibility, respiratory phasicity and response to augmentation. IMPRESSION: Occlusive thrombus throughout the visualized right greater saphenous vein (a superficial vein, this is not a deep vein thrombosis) extending into the common femoral vein were is nonocclusive (this is a deep vein thrombosis). The central extent of thrombus is not visualized on this study. Recommend CT venogram of the abdomen and pelvis to evaluate for iliofemoral extension of thrombus. These results were called by telephone at the time of interpretation on 05/26/2020 at 9:09 am to provider Lily PeerSue Nelson, MD , who verbally acknowledged these results. Marliss Cootsylan Suttle, MD [facility] Electronically Signed   By: Marliss Cootsylan  Suttle MD   On: 05/26/2020 09:23    ASSESSMENT: Iron deficiency anemia, right leg DVT.   PLAN:  1. Iron deficiency anemia: Patient last received IV iron nearly 7 years ago.  Her hemoglobin and iron stores are significantly reduced and she is mildly symptomatic.  Patient  wishes to return to clinic for treatment during her break over the Christmas holiday, therefore will return to clinic on December 21 on December 28 for Feraheme only.  She would then return to clinic in 4 months with repeat laboratory work, further evaluation, and additional Feraheme if needed. 2.  Right leg DVT: Diagnosed in October 2021.  Appears to be unprovoked.  Continue Eliquis 5 mg daily for minimum of 6 months.  Full hypercoagulable work-up was initiated today and is pending at time of dictation.  Patient expressed understanding and was in agreement with this plan. She also understands that She can call clinic at any time with any questions, concerns, or complaints.    Jeralyn Ruthsimothy J Randye Treichler, MD   06/20/2020 3:42 PM

## 2020-06-20 ENCOUNTER — Inpatient Hospital Stay: Payer: 59 | Attending: Oncology | Admitting: Oncology

## 2020-06-20 ENCOUNTER — Inpatient Hospital Stay: Payer: 59

## 2020-06-20 ENCOUNTER — Encounter: Payer: Self-pay | Admitting: Oncology

## 2020-06-20 ENCOUNTER — Other Ambulatory Visit: Payer: Self-pay

## 2020-06-20 VITALS — BP 131/74 | HR 103 | Temp 98.3°F | Wt 262.1 lb

## 2020-06-20 DIAGNOSIS — D509 Iron deficiency anemia, unspecified: Secondary | ICD-10-CM | POA: Insufficient documentation

## 2020-06-20 DIAGNOSIS — Z7901 Long term (current) use of anticoagulants: Secondary | ICD-10-CM | POA: Insufficient documentation

## 2020-06-20 DIAGNOSIS — I82411 Acute embolism and thrombosis of right femoral vein: Secondary | ICD-10-CM

## 2020-06-20 DIAGNOSIS — I82401 Acute embolism and thrombosis of unspecified deep veins of right lower extremity: Secondary | ICD-10-CM | POA: Diagnosis not present

## 2020-06-20 LAB — IRON AND TIBC
Iron: 25 ug/dL — ABNORMAL LOW (ref 28–170)
Saturation Ratios: 4 % — ABNORMAL LOW (ref 10.4–31.8)
TIBC: 637 ug/dL — ABNORMAL HIGH (ref 250–450)
UIBC: 612 ug/dL

## 2020-06-20 LAB — CBC WITH DIFFERENTIAL/PLATELET
Abs Immature Granulocytes: 0.03 10*3/uL (ref 0.00–0.07)
Basophils Absolute: 0 10*3/uL (ref 0.0–0.1)
Basophils Relative: 1 %
Eosinophils Absolute: 0.4 10*3/uL (ref 0.0–0.5)
Eosinophils Relative: 5 %
HCT: 33.7 % — ABNORMAL LOW (ref 36.0–46.0)
Hemoglobin: 9.8 g/dL — ABNORMAL LOW (ref 12.0–15.0)
Immature Granulocytes: 0 %
Lymphocytes Relative: 29 %
Lymphs Abs: 2.4 10*3/uL (ref 0.7–4.0)
MCH: 23.7 pg — ABNORMAL LOW (ref 26.0–34.0)
MCHC: 29.1 g/dL — ABNORMAL LOW (ref 30.0–36.0)
MCV: 81.6 fL (ref 80.0–100.0)
Monocytes Absolute: 0.7 10*3/uL (ref 0.1–1.0)
Monocytes Relative: 9 %
Neutro Abs: 4.8 10*3/uL (ref 1.7–7.7)
Neutrophils Relative %: 56 %
Platelets: 341 10*3/uL (ref 150–400)
RBC: 4.13 MIL/uL (ref 3.87–5.11)
RDW: 18 % — ABNORMAL HIGH (ref 11.5–15.5)
WBC: 8.4 10*3/uL (ref 4.0–10.5)
nRBC: 0 % (ref 0.0–0.2)

## 2020-06-20 LAB — ANTITHROMBIN III: AntiThromb III Func: 120 % (ref 75–120)

## 2020-06-20 LAB — FERRITIN: Ferritin: 6 ng/mL — ABNORMAL LOW (ref 11–307)

## 2020-06-21 ENCOUNTER — Other Ambulatory Visit: Payer: Self-pay | Admitting: Oncology

## 2020-06-21 LAB — PROTEIN C, TOTAL: Protein C, Total: 106 % (ref 60–150)

## 2020-06-22 LAB — PROTEIN S ACTIVITY: Protein S Activity: 120 % (ref 63–140)

## 2020-06-22 LAB — LUPUS ANTICOAGULANT PANEL
DRVVT: 35 s (ref 0.0–47.0)
PTT Lupus Anticoagulant: 29.6 s (ref 0.0–51.9)

## 2020-06-22 LAB — BETA-2-GLYCOPROTEIN I ABS, IGG/M/A
Beta-2 Glyco I IgG: <9 GPI IgG units (ref 0–20)
Beta-2-Glycoprotein I IgA: <9 GPI IgA units (ref 0–25)
Beta-2-Glycoprotein I IgM: <9 GPI IgM units (ref 0–32)

## 2020-06-22 LAB — PROTEIN C ACTIVITY: Protein C Activity: 148 % (ref 73–180)

## 2020-06-22 LAB — PROTEIN S, TOTAL: Protein S Ag, Total: 134 % (ref 60–150)

## 2020-06-23 LAB — CARDIOLIPIN ANTIBODIES, IGG, IGM, IGA
Anticardiolipin IgA: <9 APL U/mL (ref 0–11)
Anticardiolipin IgG: <9 GPL U/mL (ref 0–14)
Anticardiolipin IgM: <9 MPL U/mL (ref 0–12)

## 2020-06-23 LAB — FACTOR 5 LEIDEN

## 2020-06-26 LAB — PROTHROMBIN GENE MUTATION

## 2020-06-29 ENCOUNTER — Encounter: Payer: Self-pay | Admitting: Psychiatry

## 2020-06-29 ENCOUNTER — Other Ambulatory Visit: Payer: Self-pay

## 2020-06-29 ENCOUNTER — Ambulatory Visit (INDEPENDENT_AMBULATORY_CARE_PROVIDER_SITE_OTHER): Payer: 59 | Admitting: Psychiatry

## 2020-06-29 ENCOUNTER — Other Ambulatory Visit: Payer: Self-pay | Admitting: Psychiatry

## 2020-06-29 DIAGNOSIS — F5101 Primary insomnia: Secondary | ICD-10-CM | POA: Diagnosis not present

## 2020-06-29 DIAGNOSIS — F4323 Adjustment disorder with mixed anxiety and depressed mood: Secondary | ICD-10-CM | POA: Diagnosis not present

## 2020-06-29 DIAGNOSIS — F411 Generalized anxiety disorder: Secondary | ICD-10-CM | POA: Diagnosis not present

## 2020-06-29 MED ORDER — DAYVIGO 10 MG PO TABS: 1.0000 | ORAL_TABLET | Freq: Every day | ORAL | 5 refills | Status: DC

## 2020-06-29 MED ORDER — ARMODAFINIL 150 MG PO TABS: 150.0000 mg | ORAL_TABLET | ORAL | 5 refills | Status: DC

## 2020-06-29 MED ORDER — ALPRAZOLAM 1 MG PO TABS: ORAL_TABLET | ORAL | 5 refills | Status: DC

## 2020-06-29 NOTE — Progress Notes (Signed)
Michaela Jones [id number] 1975-10-23 44 y.o.  Subjective:   Patient ID:  Michaela Jones is a 44 y.o. (DOB 1976-05-20) female.  Chief Complaint:  Chief Complaint  Patient presents with  . Follow-up    h/o anxiety, depression, insomnia, and ADHD    HPI Michaela Jones presents to the office today for follow-up of anxiety, depression, insomnia, and ADHD. She reports that she had to abruptly come off of pain medications. She reports that she was advised by another provider to stop all of her medications and reports that this was difficult. She now has a new PCP and seeing them for pain management. Taking Xanax 2 mg at night and then 1 mg as needed during the daytime.   She reports that her mood has been improved now that she is working. She reports that she has had some stressors with caring for nephew and working full-time. Anxiety has been manageable. Sleep is disrupted by nephew awakening in the early mornings. Appetite has been ok. She reports that Armodafinil has been helpful for concentration. Energy and motivation have improved. Denies SI.   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   Past Psychiatric Medication Trials: Cymbalta Sertraline Remeron Wellbutrin Buspar Nuvigil Provigil- increased activation Ritalin Concerta Adderall- Not as effective Strattera Klonopin Xanax Vistaril- Ineffective Ambien- Effective. Caused parasomnias.  Lunesta Temazepam Trazodone-Ineffective Belsomra Doxepin Silenor Seroquel- wt gain Gabapentin- Stuttering Lyrica Trileptal- tingling in extremities  PHQ2-9     Office Visit from 04/27/2020 in [facility]  PHQ-2 Total Score 4  PHQ-9 Total Score 14       Review of Systems:  Review of Systems  Cardiovascular: Negative for palpitations.  Musculoskeletal: Positive for back pain. Negative for gait problem.       Leg pain  Neurological: Negative for tremors.  Hematological:       Recently had DVT  Psychiatric/Behavioral:        Please refer to HPI    Medications: I have reviewed the patient's current medications.  Current Outpatient Medications  Medication Sig Dispense Refill  . [START ON 07/26/2020] ALPRAZolam (XANAX) 1 MG tablet TAKE 1/2 TO 1 TABLET BY MOUTH THREE TIMES DAILY AS NEEDED ANXIETY 90 tablet 5  . apixaban (ELIQUIS) 5 MG TABS tablet Take 1 tablet (5 mg total) by mouth 2 (two) times daily. 60 tablet 2  . cetirizine (ZYRTEC) 10 MG tablet Take 10 mg by mouth daily as needed for allergies.    . Cholecalciferol (VITAMIN D) 50 MCG (2000 UT) CAPS Take by mouth.    . cyanocobalamin (,VITAMIN B-12,) 1000 MCG/ML injection INJECT 1 MILLILITER (ML) ONCE MONTHLY 6 mL 4  . fluticasone (FLONASE) 50 MCG/ACT nasal spray Place 2 sprays into both nostrils daily. 16 g 6  . HYDROcodone-acetaminophen (NORCO) 10-325 MG tablet Take 1 tablet by mouth every 8 (eight) hours as needed. 90 tablet 0  . [START ON 07/26/2020] Lemborexant (DAYVIGO) 10 MG TABS Take 1 tablet by mouth at bedtime. 30 tablet 5  . levothyroxine (SYNTHROID) 125 MCG tablet Take 1 tablet (125 mcg total) by mouth daily. 90 tablet 0  . Liraglutide -Weight Management (SAXENDA) 18 MG/3ML SOPN Inject 0.6 mg into the skin daily. 3 mL 2  . Multiple Vitamin (MULTIVITAMIN) tablet Take 1 tablet by mouth daily.    Marland Kitchen albuterol (PROVENTIL HFA;VENTOLIN HFA) 108 (90 Base) MCG/ACT inhaler Inhale 2 puffs into the lungs every 6 (six) hours as needed for wheezing or shortness of breath. (Patient not taking: Reported on  06/20/2020)    . albuterol (PROVENTIL) (5 MG/ML) 0.5% nebulizer solution Inhale into the lungs. (Patient not taking: Reported on 06/20/2020)    . Armodafinil 150 MG tablet Take 1 tablet (150 mg total) by mouth every morning. 30 tablet 5  . cyclobenzaprine (FLEXERIL) 10 MG tablet Take 1 tablet (10 mg total) by mouth 3 (three) times daily as needed for muscle spasms. (Patient not taking: Reported on 04/27/2020) 30 tablet 0  . Insulin Pen Needle (NOVOFINE PEN NEEDLE)  32G X 6 MM MISC Use daily with saxenda. 100 each 0  . meloxicam (MOBIC) 15 MG tablet Take 1 tablet (15 mg total) by mouth daily. (Patient not taking: Reported on 06/20/2020) 30 tablet 0  . promethazine (PHENERGAN) 25 MG tablet Take by mouth. (Patient not taking: Reported on 06/29/2020)     No current facility-administered medications for this visit.    Medication Side Effects: None  Allergies:  Allergies  Allergen Reactions  . Lyrica [Pregabalin] Other (See Comments)    Patient reports neurological problem. Speech disturbances and unable to control motor skills  . Gabapentin Swelling  . Nsaids Other (See Comments)    Avoids due to gastric bypass  . Penicillins Rash    Past Medical History:  Diagnosis Date  . Anemia   . Chronic back pain   . Complication of anesthesia    HARD TO WAKE UP/OXYGEN LEVEL DROPPED AFTER BACK SURGERY X1  . Constipation, chronic   . Depression   . DVT (deep venous thrombosis) (HCC)   . Edema   . History of seasonal allergies    pt has inhalers for this if needed  . Hypotension   . Hypothyroid   . Insomnia   . Lymphedema of both lower extremities   . Migraine   . Restless leg syndrome   . Vitamin D deficiency     Family History  Problem Relation Age of Onset  . Pulmonary embolism Mother   . Depression Mother   . Asthma Mother   . Heart attack Father   . Cancer Father     Social History   Socioeconomic History  . Marital status: Married    Spouse name: Not on file  . Number of children: Not on file  . Years of education: Not on file  . Highest education level: Not on file  Occupational History  . Not on file  Tobacco Use  . Smoking status: Never Smoker  . Smokeless tobacco: Never Used  Substance and Sexual Activity  . Alcohol use: Yes    Alcohol/week: 0.0 standard drinks    Comment: Rare  . Drug use: No  . Sexual activity: Not on file  Other Topics Concern  . Not on file  Social History Narrative  . Not on file   Social  Determinants of Health   Financial Resource Strain:   . Difficulty of Paying Living Expenses: Not on file  Food Insecurity:   . Worried About Programme researcher, broadcasting/film/video in the Last Year: Not on file  . Ran Out of Food in the Last Year: Not on file  Transportation Needs:   . Lack of Transportation (Medical): Not on file  . Lack of Transportation (Non-Medical): Not on file  Physical Activity:   . Days of Exercise per Week: Not on file  . Minutes of Exercise per Session: Not on file  Stress:   . Feeling of Stress : Not on file  Social Connections:   . Frequency of Communication with Friends  and Family: Not on file  . Frequency of Social Gatherings with Friends and Family: Not on file  . Attends Religious Services: Not on file  . Active Member of Clubs or Organizations: Not on file  . Attends BankerClub or Organization Meetings: Not on file  . Marital Status: Not on file  Intimate Partner Violence:   . Fear of Current or Ex-Partner: Not on file  . Emotionally Abused: Not on file  . Physically Abused: Not on file  . Sexually Abused: Not on file    Past Medical History, Surgical history, Social history, and Family history were reviewed and updated as appropriate.   Please see review of systems for further details on the patient's review from today.   Objective:   Physical Exam:  BP (!) 148/96   Pulse 93   Physical Exam Constitutional:      General: She is not in acute distress. Musculoskeletal:        General: No deformity.  Neurological:     Mental Status: She is alert and oriented to person, place, and time.     Coordination: Coordination normal.  Psychiatric:        Attention and Perception: Attention and perception normal. She does not perceive auditory or visual hallucinations.        Mood and Affect: Mood normal. Mood is not anxious or depressed. Affect is not labile, blunt, angry or inappropriate.        Speech: Speech normal.        Behavior: Behavior normal.        Thought  Content: Thought content normal. Thought content is not paranoid or delusional. Thought content does not include homicidal or suicidal ideation. Thought content does not include homicidal or suicidal plan.        Cognition and Memory: Cognition and memory normal.        Judgment: Judgment normal.     Comments: Insight intact     Lab Review:     Component Value Date/Time   NA 138 05/26/2020 1312   NA 134 04/07/2020 1130   NA 139 12/03/2012 1136   K 4.6 05/26/2020 1312   K 4.0 12/03/2012 1136   CL 103 05/26/2020 1312   CL 105 12/03/2012 1136   CO2 25 05/26/2020 1312   CO2 26 12/03/2012 1136   GLUCOSE 104 (H) 05/26/2020 1312   GLUCOSE 77 12/03/2012 1136   BUN 8 05/26/2020 1312   BUN 11 04/07/2020 1130   BUN 16 12/03/2012 1136   CREATININE 0.68 05/26/2020 1312   CREATININE 0.46 (L) 12/03/2012 1136   CALCIUM 8.6 (L) 05/26/2020 1312   CALCIUM 9.0 12/03/2012 1136   PROT 7.6 05/26/2020 1312   PROT 7.3 04/07/2020 1130   ALBUMIN 3.4 (L) 05/26/2020 1312   ALBUMIN 4.1 04/07/2020 1130   AST 32 05/26/2020 1312   ALT 25 05/26/2020 1312   ALKPHOS 94 05/26/2020 1312   BILITOT 0.7 05/26/2020 1312   BILITOT 0.3 04/07/2020 1130   GFRNONAA >60 05/26/2020 1312   GFRNONAA >60 12/03/2012 1136   GFRAA 102 04/07/2020 1130   GFRAA >60 12/03/2012 1136       Component Value Date/Time   WBC 8.4 06/20/2020 1506   RBC 4.13 06/20/2020 1506   HGB 9.8 (L) 06/20/2020 1506   HGB 9.6 (L) 04/07/2020 1130   HCT 33.7 (L) 06/20/2020 1506   HCT 32.4 (L) 04/07/2020 1130   PLT 341 06/20/2020 1506   PLT 405 04/07/2020 1130   MCV 81.6  06/20/2020 1506   MCV 85 04/07/2020 1130   MCV 101 (H) 06/15/2013 1042   MCH 23.7 (L) 06/20/2020 1506   MCHC 29.1 (L) 06/20/2020 1506   RDW 18.0 (H) 06/20/2020 1506   RDW 15.7 (H) 04/07/2020 1130   RDW 14.5 06/15/2013 1042   LYMPHSABS 2.4 06/20/2020 1506   LYMPHSABS 2.5 04/07/2020 1130   LYMPHSABS 3.5 06/15/2013 1042   MONOABS 0.7 06/20/2020 1506   MONOABS 0.9  06/15/2013 1042   EOSABS 0.4 06/20/2020 1506   EOSABS 0.2 04/07/2020 1130   EOSABS 0.1 06/15/2013 1042   BASOSABS 0.0 06/20/2020 1506   BASOSABS 0.1 04/07/2020 1130   BASOSABS 0.1 06/15/2013 1042    No results found for: POCLITH, LITHIUM   No results found for: PHENYTOIN, PHENOBARB, VALPROATE, CBMZ   .res Assessment: Plan:   Continue Xanax 1 mg 1/2-1 tab po TID prn anxiety.  Continue Dayvigo 10 mg po QHS for insomnia.  Continue Modafinil 150 mg po q am for ADHD.  Pt to follow-up in 6 months or sooner if clinically indicated.  Patient advised to contact office with any questions, adverse effects, or acute worsening in signs and symptoms.  Michaela Jones was seen today for follow-up.  Diagnoses and all orders for this visit:  Generalized anxiety disorder -     ALPRAZolam (XANAX) 1 MG tablet; TAKE 1/2 TO 1 TABLET BY MOUTH THREE TIMES DAILY AS NEEDED ANXIETY  Primary insomnia -     Lemborexant (DAYVIGO) 10 MG TABS; Take 1 tablet by mouth at bedtime. -     ALPRAZolam (XANAX) 1 MG tablet; TAKE 1/2 TO 1 TABLET BY MOUTH THREE TIMES DAILY AS NEEDED ANXIETY  Adjustment disorder with mixed anxiety and depressed mood -     Armodafinil 150 MG tablet; Take 1 tablet (150 mg total) by mouth every morning.     Please see After Visit Summary for patient specific instructions.  Future Appointments  Date Time Provider Department Center  07/18/2020  2:00 PM [facility] INFUSION CHAIR 20 [facility] None  07/25/2020  2:00 PM [facility] INFUSION CHAIR 4 [facility] None  08/01/2020  1:30 PM [facility] INFUSION CHAIR 14 [facility] None  08/08/2020  1:30 PM [facility] INFUSION CHAIR 4 [facility] None  10/20/2020  2:30 PM [facility] LAB [facility] None  10/24/2020  1:00 PM Jeralyn Ruths, MD [facility] None  10/24/2020  1:30 PM [facility] INFUSION CHAIR 3 [facility] None  01/10/2021 12:45 PM Corie Chiquito, PMHNP [facility] None    No orders of the defined types were placed in this  encounter.   -------------------------------

## 2020-07-13 ENCOUNTER — Other Ambulatory Visit: Payer: Self-pay | Admitting: Family Medicine

## 2020-07-13 DIAGNOSIS — F1190 Opioid use, unspecified, uncomplicated: Secondary | ICD-10-CM

## 2020-07-13 DIAGNOSIS — M961 Postlaminectomy syndrome, not elsewhere classified: Secondary | ICD-10-CM

## 2020-07-13 DIAGNOSIS — I82411 Acute embolism and thrombosis of right femoral vein: Secondary | ICD-10-CM

## 2020-07-13 NOTE — Telephone Encounter (Signed)
Requested medication (s) are due for refill today:   Provider to determine  Requested medication (s) are on the active medication list:   Yes  Future visit scheduled:   No   Last ordered: 06/16/2020 #90, 0 refills  Non delegated refill   Requested Prescriptions  Pending Prescriptions Disp Refills   HYDROcodone-acetaminophen (NORCO) 10-325 MG tablet [Pharmacy Med Name: HYDROCODONE-ACETAMINOPHEN 1 10-325 Tablet] 90 tablet 0    Sig: TAKE 1 TABLET BY MOUTH EVERY 8 HOURS AS NEEDED.      Not Delegated - Analgesics:  Opioid Agonist Combinations Failed - 07/13/2020  8:32 AM      Failed - This refill cannot be delegated      Passed - Urine Drug Screen completed in last 360 days      Passed - Valid encounter within last 6 months    Recent Outpatient Visits           3 weeks ago Deep venous thrombosis of right profunda femoris vein (HCC)   [facility] Malva Limes, MD   1 month ago Chronic low back pain, unspecified back pain laterality, unspecified whether sciatica present   [facility] Malva Limes, MD   2 months ago Chronic low back pain, unspecified back pain laterality, unspecified whether sciatica present   [facility] [place], Lavella Hammock, PA-C   3 months ago Annual physical exam   [facility] [place], Ricki Rodriguez M, [place]   3 months ago Cystitis   [facility] [place], Ricki Rodriguez [place], [place]

## 2020-07-18 ENCOUNTER — Other Ambulatory Visit: Payer: Self-pay

## 2020-07-18 ENCOUNTER — Inpatient Hospital Stay: Payer: 59 | Attending: Oncology

## 2020-07-18 VITALS — BP 124/75 | HR 79 | Temp 97.6°F | Resp 16

## 2020-07-18 DIAGNOSIS — D509 Iron deficiency anemia, unspecified: Secondary | ICD-10-CM

## 2020-07-18 MED ORDER — IRON SUCROSE 20 MG/ML IV SOLN
200.0000 mg | Freq: Once | INTRAVENOUS | Status: AC
  Administered 2020-07-18: 200 mg via INTRAVENOUS
  Filled 2020-07-18: qty 10

## 2020-07-18 MED ORDER — SODIUM CHLORIDE 0.9 % IV SOLN: 200.0000 mg | Freq: Once | INTRAVENOUS | Status: DC

## 2020-07-18 MED ORDER — SODIUM CHLORIDE 0.9 % IV SOLN
Freq: Once | INTRAVENOUS | Status: AC
  Filled 2020-07-18: qty 250

## 2020-07-18 NOTE — Progress Notes (Signed)
Patient tolerated infusion well. Monitored x 30 minutes post infusion. Patient and VSS. Discharged home.

## 2020-07-25 ENCOUNTER — Inpatient Hospital Stay: Payer: 59

## 2020-07-25 VITALS — BP 118/64 | HR 83 | Temp 98.9°F | Resp 18

## 2020-07-25 DIAGNOSIS — D509 Iron deficiency anemia, unspecified: Secondary | ICD-10-CM | POA: Diagnosis not present

## 2020-07-25 MED ORDER — SODIUM CHLORIDE 0.9 % IV SOLN: 200.0000 mg | Freq: Once | INTRAVENOUS | Status: DC

## 2020-07-25 MED ORDER — SODIUM CHLORIDE 0.9 % IV SOLN
Freq: Once | INTRAVENOUS | Status: AC
Start: 2020-07-25 — End: 2020-07-25
  Filled 2020-07-25: qty 250

## 2020-07-25 MED ORDER — IRON SUCROSE 20 MG/ML IV SOLN
200.0000 mg | Freq: Once | INTRAVENOUS | Status: AC
  Administered 2020-07-25: 200 mg via INTRAVENOUS
  Filled 2020-07-25: qty 10

## 2020-07-25 NOTE — Progress Notes (Signed)
1503- Patient tolerated Venofer infusion well. Patient and vital signs stable. Patient discharged to home at this time. 

## 2020-08-01 ENCOUNTER — Ambulatory Visit: Payer: 59

## 2020-08-02 ENCOUNTER — Inpatient Hospital Stay: Payer: 59

## 2020-08-08 ENCOUNTER — Ambulatory Visit: Payer: 59

## 2020-08-10 ENCOUNTER — Other Ambulatory Visit: Payer: Self-pay | Admitting: Family Medicine

## 2020-08-10 DIAGNOSIS — I82411 Acute embolism and thrombosis of right femoral vein: Secondary | ICD-10-CM

## 2020-08-10 DIAGNOSIS — F1190 Opioid use, unspecified, uncomplicated: Secondary | ICD-10-CM

## 2020-08-10 DIAGNOSIS — M961 Postlaminectomy syndrome, not elsewhere classified: Secondary | ICD-10-CM

## 2020-08-10 NOTE — Telephone Encounter (Signed)
Requested medication (s) are due for refill today: yes  Requested medication (s) are on the active medication list: yes  Last refill:  07/13/2020  Future visit scheduled: no  Notes to clinic:  this refill cannot be delegated    Requested Prescriptions  Pending Prescriptions Disp Refills   HYDROcodone-acetaminophen (NORCO) 10-325 MG tablet [Pharmacy Med Name: HYDROCODONE-ACETAMINOPHEN 1 10-325 Tablet] 90 tablet 0    Sig: TAKE 1 TABLET BY MOUTH EVERY 8 HOURS AS NEEDED.      Not Delegated - Analgesics:  Opioid Agonist Combinations Failed - 08/10/2020  8:18 AM      Failed - This refill cannot be delegated      Passed - Urine Drug Screen completed in last 360 days      Passed - Valid encounter within last 6 months    Recent Outpatient Visits           1 month ago Deep venous thrombosis of right profunda femoris vein (HCC)   [facility] [place] Malva Limes, MD   2 months ago Chronic low back pain, unspecified back pain laterality, unspecified whether sciatica present   [facility] Malva Limes, MD   3 months ago Chronic low back pain, unspecified back pain laterality, unspecified whether sciatica present   [facility] [place], Lavella Hammock, PA-C   4 months ago Annual physical exam   [facility] Osvaldo Angst M, [place]   4 months ago Cystitis   [facility] [place], Ricki Rodriguez [place], [place]

## 2020-08-11 ENCOUNTER — Other Ambulatory Visit: Payer: Self-pay | Admitting: Family Medicine

## 2020-08-14 ENCOUNTER — Inpatient Hospital Stay: Payer: 59 | Attending: Oncology

## 2020-08-15 ENCOUNTER — Ambulatory Visit: Payer: 59

## 2020-08-17 ENCOUNTER — Other Ambulatory Visit: Payer: Self-pay | Admitting: Physician Assistant

## 2020-08-17 DIAGNOSIS — E039 Hypothyroidism, unspecified: Secondary | ICD-10-CM

## 2020-08-22 ENCOUNTER — Other Ambulatory Visit: Payer: Self-pay | Admitting: Family Medicine

## 2020-08-22 ENCOUNTER — Other Ambulatory Visit: Payer: Self-pay | Admitting: Physician Assistant

## 2020-08-22 NOTE — Telephone Encounter (Signed)
  Notes to clinic:  medication filled by historical provider  Review for refill   Requested Prescriptions  Pending Prescriptions Disp Refills   promethazine (PHENERGAN) 25 MG tablet [Pharmacy Med Name: PROMETHAZINE 25 MG TABLET 25 Tablet] 30 tablet 0    Sig: TAKE 1 TABLET BY MOUTH EVERY 8 HOURS AS NEEDED FOR NAUSEA FOR UP TO 10 DAYS      Not Delegated - Gastroenterology: Antiemetics Failed - 08/22/2020  8:47 AM      Failed - This refill cannot be delegated      Passed - Valid encounter within last 6 months    Recent Outpatient Visits           2 months ago Deep venous thrombosis of right profunda femoris vein (HCC)   [facility] Malva Limes, MD   3 months ago Chronic low back pain, unspecified back pain laterality, unspecified whether sciatica present   [facility] Malva Limes, MD   3 months ago Chronic low back pain, unspecified back pain laterality, unspecified whether sciatica present   [facility] [place], Lavella Hammock, PA-C   4 months ago Annual physical exam   [facility] [place], Ricki Rodriguez M, [place]   4 months ago Cystitis   [facility] [place], Ricki Rodriguez [place], [place]

## 2020-09-07 ENCOUNTER — Other Ambulatory Visit: Payer: Self-pay | Admitting: Family Medicine

## 2020-09-07 DIAGNOSIS — I82411 Acute embolism and thrombosis of right femoral vein: Secondary | ICD-10-CM

## 2020-09-07 DIAGNOSIS — F1190 Opioid use, unspecified, uncomplicated: Secondary | ICD-10-CM

## 2020-09-07 DIAGNOSIS — M961 Postlaminectomy syndrome, not elsewhere classified: Secondary | ICD-10-CM

## 2020-09-07 NOTE — Telephone Encounter (Signed)
Requested medication (s) are due for refill today: yes  Requested medication (s) are on the active medication list: yes  Last refill:  08/11/2020  Future visit scheduled: no  Notes to clinic:  this refill cannot be delegated    Requested Prescriptions  Pending Prescriptions Disp Refills   HYDROcodone-acetaminophen (NORCO) 10-325 MG tablet [Pharmacy Med Name: HYDROCODONE-ACETAMINOPHEN 1 10-325 Tablet] 90 tablet 0    Sig: TAKE 1 TABLET BY MOUTH EVERY 8 HOURS AS NEEDED.      Not Delegated - Analgesics:  Opioid Agonist Combinations Failed - 09/07/2020  8:02 AM      Failed - This refill cannot be delegated      Passed - Urine Drug Screen completed in last 360 days      Passed - Valid encounter within last 6 months    Recent Outpatient Visits           2 months ago Deep venous thrombosis of right profunda femoris vein (HCC)   [facility] Malva Limes, MD   3 months ago Chronic low back pain, unspecified back pain laterality, unspecified whether sciatica present   [facility] Malva Limes, MD   4 months ago Chronic low back pain, unspecified back pain laterality, unspecified whether sciatica present   [facility] [place], Lavella Hammock, PA-C   5 months ago Annual physical exam   [facility] [place], Ricki Rodriguez M, [place]   5 months ago Cystitis   [facility] [place], Ricki Rodriguez [place], [place]

## 2020-09-07 NOTE — Telephone Encounter (Signed)
30 days supply dispensed 08-11-2020

## 2020-09-08 ENCOUNTER — Other Ambulatory Visit: Payer: Self-pay | Admitting: Family Medicine

## 2020-09-26 ENCOUNTER — Other Ambulatory Visit: Payer: Self-pay | Admitting: Family Medicine

## 2020-09-26 DIAGNOSIS — I82411 Acute embolism and thrombosis of right femoral vein: Secondary | ICD-10-CM

## 2020-09-26 DIAGNOSIS — M961 Postlaminectomy syndrome, not elsewhere classified: Secondary | ICD-10-CM

## 2020-09-26 DIAGNOSIS — F1190 Opioid use, unspecified, uncomplicated: Secondary | ICD-10-CM

## 2020-09-26 NOTE — Telephone Encounter (Signed)
Requested medication (s) are due for refill today: Yes  Requested medication (s) are on the active medication list: Yes  Last refill:  09/08/20  Future visit scheduled: No  Notes to clinic:  See request.    Requested Prescriptions  Pending Prescriptions Disp Refills   HYDROcodone-acetaminophen (NORCO) 10-325 MG tablet [Pharmacy Med Name: HYDROCODONE-ACETAMINOPHEN 1 10-325 Tablet] 90 tablet 0    Sig: Take 1 tablet by mouth every 8 (eight) hours as needed.      Not Delegated - Analgesics:  Opioid Agonist Combinations Failed - 09/26/2020  3:41 PM      Failed - This refill cannot be delegated      Passed - Urine Drug Screen completed in last 360 days      Passed - Valid encounter within last 6 months    Recent Outpatient Visits           3 months ago Deep venous thrombosis of right profunda femoris vein (HCC)   [facility] Malva Limes, MD   4 months ago Chronic low back pain, unspecified back pain laterality, unspecified whether sciatica present   [facility] Malva Limes, MD   5 months ago Chronic low back pain, unspecified back pain laterality, unspecified whether sciatica present   [facility] [place], Lavella Hammock, PA-C   5 months ago Annual physical exam   [facility] [place], Ricki Rodriguez M, [place]   5 months ago Cystitis   [facility] [place], Ricki Rodriguez [place], [place]

## 2020-09-27 ENCOUNTER — Other Ambulatory Visit: Payer: Self-pay | Admitting: Family Medicine

## 2020-10-20 ENCOUNTER — Other Ambulatory Visit: Payer: 59

## 2020-10-24 ENCOUNTER — Ambulatory Visit: Payer: 59 | Admitting: Oncology

## 2020-10-24 ENCOUNTER — Ambulatory Visit: Payer: 59

## 2020-10-29 ENCOUNTER — Other Ambulatory Visit: Payer: Self-pay

## 2020-11-03 ENCOUNTER — Other Ambulatory Visit: Payer: Self-pay | Admitting: Family Medicine

## 2020-11-03 ENCOUNTER — Other Ambulatory Visit: Payer: Self-pay

## 2020-11-03 DIAGNOSIS — I82411 Acute embolism and thrombosis of right femoral vein: Secondary | ICD-10-CM

## 2020-11-03 DIAGNOSIS — M961 Postlaminectomy syndrome, not elsewhere classified: Secondary | ICD-10-CM

## 2020-11-03 DIAGNOSIS — F1190 Opioid use, unspecified, uncomplicated: Secondary | ICD-10-CM

## 2020-11-03 MED ORDER — HYDROCODONE-ACETAMINOPHEN 10-325 MG PO TABS
ORAL_TABLET | ORAL | 0 refills | Status: DC
  Filled 2020-11-03 (×2): qty 90, 30d supply, fill #0

## 2020-11-03 MED FILL — Alprazolam Tab 1 MG: ORAL | 30 days supply | Qty: 90 | Fill #0 | Status: AC

## 2020-11-03 NOTE — Telephone Encounter (Signed)
Please review. Thanks!  

## 2020-11-03 NOTE — Telephone Encounter (Signed)
Requested medication (s) are due for refill today: yes  Requested medication (s) are on the active medication list:  yes  Last refill:  10/06/2020  Future visit scheduled:no  Notes to clinic:  this refill cannot be delegated    Requested Prescriptions  Pending Prescriptions Disp Refills   HYDROcodone-acetaminophen (NORCO) 10-325 MG tablet 90 tablet 0    Sig: TAKE 1 TABLET BY MOUTH EVERY EIGHT HOURS AS NEEDED      Not Delegated - Analgesics:  Opioid Agonist Combinations Failed - 11/03/2020 10:05 AM      Failed - This refill cannot be delegated      Passed - Urine Drug Screen completed in last 360 days      Passed - Valid encounter within last 6 months    Recent Outpatient Visits           4 months ago Deep venous thrombosis of right profunda femoris vein (HCC)   [facility] Malva Limes, MD   5 months ago Chronic low back pain, unspecified back pain laterality, unspecified whether sciatica present   [facility] Malva Limes, MD   6 months ago Chronic low back pain, unspecified back pain laterality, unspecified whether sciatica present   [facility] [place], Lavella Hammock, PA-C   7 months ago Annual physical exam   [facility] Osvaldo Angst M, [place]   7 months ago Cystitis   [facility] [place], Ricki Rodriguez [place], [place]

## 2020-11-06 ENCOUNTER — Other Ambulatory Visit: Payer: Self-pay

## 2020-11-06 ENCOUNTER — Other Ambulatory Visit: Payer: Self-pay | Admitting: Oncology

## 2020-11-12 NOTE — Progress Notes (Signed)
[facility]  Telephone:(336) [phone] Fax:(336) [phone]  ID: Michaela Jones OB: 12/08/1975  MR#: [id number]  LFY#:[id number]  Patient Care Team: Malva Limes, MD as PCP - General (Family Medicine) Lemar Livings Merrily Pew, MD (General Surgery)  CHIEF COMPLAINT: Iron deficiency anemia, heterozygous for prothrombin gene mutation.  INTERVAL HISTORY: Patient returns to clinic today for repeat laboratory work, further evaluation, and consideration of additional IV iron.  She currently feels well and is asymptomatic. She has no neurologic complaints.  She denies any recent fevers or illnesses.  She has a good appetite and denies weight loss.  She has no chest pain, shortness of breath, cough, or hemoptysis.  She denies any nausea, vomiting, constipation, or diarrhea.  She has no melena or hematochezia.  She has no urinary complaints.  Patient offers no specific complaints today.  REVIEW OF SYSTEMS:   Review of Systems  Constitutional: Negative.  Negative for fever, malaise/fatigue and weight loss.  Respiratory: Negative.  Negative for cough, hemoptysis and shortness of breath.   Cardiovascular: Negative.  Negative for chest pain and leg swelling.  Gastrointestinal: Negative.  Negative for abdominal pain, blood in stool and melena.  Genitourinary: Negative.  Negative for hematuria.  Musculoskeletal: Positive for back pain.  Skin: Negative for rash.  Neurological: Negative.  Negative for dizziness, focal weakness, weakness and headaches.  Psychiatric/Behavioral: Negative.  The patient is not nervous/anxious.     As per HPI. Otherwise, a complete review of systems is negative.  PAST MEDICAL HISTORY: Past Medical History:  Diagnosis Date  . Anemia   . Chronic back pain   . Complication of anesthesia    HARD TO WAKE UP/OXYGEN LEVEL DROPPED AFTER BACK SURGERY X1  . Constipation, chronic   . Depression   . DVT (deep venous thrombosis) (HCC)   . Edema   . History of seasonal  allergies    pt has inhalers for this if needed  . Hypotension   . Hypothyroid   . Insomnia   . Lymphedema of both lower extremities   . Migraine   . Restless leg syndrome   . Vitamin D deficiency     PAST SURGICAL HISTORY: Past Surgical History:  Procedure Laterality Date  . ABDOMINOPLASTY  2004  . ABLATION  2014  . BACK SURGERY  10/2012  . BREAST ENHANCEMENT SURGERY  07/2008  . BREAST SURGERY  09/05/2011   removal of implants  . CHOLECYSTECTOMY N/A 04/16/2016   Procedure: LAPAROSCOPIC CHOLECYSTECTOMY WITH INTRAOPERATIVE CHOLANGIOGRAM;  Surgeon: Earline Mayotte, MD;  Location: [facility] ORS;  Service: General;  Laterality: N/A;  . GASTRIC BYPASS  2002  . Lower body  01/2008, 03/2008   lift legs and thighs    FAMILY HISTORY: Family History  Problem Relation Age of Onset  . Pulmonary embolism Mother   . Depression Mother   . Asthma Mother   . Heart attack Father   . Cancer Father     ADVANCED DIRECTIVES (Y/N):  N  HEALTH MAINTENANCE: Social History   Tobacco Use  . Smoking status: Never Smoker  . Smokeless tobacco: Never Used  Substance Use Topics  . Alcohol use: Yes    Alcohol/week: 0.0 standard drinks    Comment: Rare  . Drug use: No     Colonoscopy:  PAP:  Bone density:  Lipid panel:  Allergies  Allergen Reactions  . Lyrica [Pregabalin] Other (See Comments)    Patient reports neurological problem. Speech disturbances and unable to control motor skills  . Gabapentin  Swelling  . Nsaids Other (See Comments)    Avoids due to gastric bypass Avoids due to gastric bypass  . Penicillins Rash    Current Outpatient Medications  Medication Sig Dispense Refill  . albuterol (PROVENTIL HFA;VENTOLIN HFA) 108 (90 Base) MCG/ACT inhaler Inhale 2 puffs into the lungs every 6 (six) hours as needed for wheezing or shortness of breath. (Patient not taking: Reported on 06/20/2020)    . albuterol (PROVENTIL) (5 MG/ML) 0.5% nebulizer solution Inhale into the lungs. (Patient  not taking: Reported on 06/20/2020)    . ALPRAZolam (XANAX) 1 MG tablet TAKE 1/2 TO 1 TABLET BY MOUTH THREE TIMES DAILY AS NEEDED FOR ANXIETY 90 tablet 5  . apixaban (ELIQUIS) 5 MG TABS tablet TAKE 1 TABLET BY MOUTH TWICE DAILY 60 tablet 2  . Armodafinil 150 MG tablet TAKE 1 TABLET BY MOUTH EVERY MORNING 30 tablet 5  . cetirizine (ZYRTEC) 10 MG tablet Take 10 mg by mouth daily as needed for allergies.    . Cholecalciferol (VITAMIN D) 50 MCG (2000 UT) CAPS Take by mouth.    . cyanocobalamin (,VITAMIN B-12,) 1000 MCG/ML injection INJECT 1 MILLILITER (ML) ONCE MONTHLY 6 mL 4  . cyclobenzaprine (FLEXERIL) 10 MG tablet Take 1 tablet (10 mg total) by mouth 3 (three) times daily as needed for muscle spasms. (Patient not taking: No sig reported) 30 tablet 0  . fluticasone (FLONASE) 50 MCG/ACT nasal spray Place 2 sprays into both nostrils daily. 16 g 6  . HYDROcodone-acetaminophen (NORCO) 10-325 MG tablet TAKE 1 TABLET BY MOUTH EVERY EIGHT HOURS AS NEEDED 90 tablet 0  . HYDROcodone-acetaminophen (NORCO) 7.5-325 MG tablet TAKE 1 TABLET BY MOUTH EVERY 6 HOURS AS NEEDED FOR UP TO 5 DAYS FOR PAIN 20 tablet 0  . Insulin Pen Needle (NOVOFINE PEN NEEDLE) 32G X 6 MM MISC Use daily with saxenda. 100 each 0  . Insulin Pen Needle 31G X 6 MM MISC USE DAILY WITH SAXENDA. 100 each 0  . Lemborexant 10 MG TABS TAKE 1 TABLET BY MOUTH AT BEDTIME. (Patient not taking: Reported on 11/14/2020) 30 tablet 5  . levothyroxine (SYNTHROID) 125 MCG tablet TAKE 1 TABLET BY MOUTH DAILY. 90 tablet 0  . Liraglutide -Weight Management 18 MG/3ML SOPN INJECT 0.6MG  INTO THE SKIN DAILY FOR 2 WEEKS THEN INCREASE TO 1.2MG  FOR 2 WEEKS (TARGET DOSE: 3MG /DAY). PT TO FOLLOW UP IN 4 WEEKS. (Patient not taking: Reported on 11/14/2020) 15 mL 2  . meloxicam (MOBIC) 15 MG tablet TAKE 1 TABLET BY MOUTH DAILY. (Patient not taking: Reported on 06/20/2020) 30 tablet 0  . Multiple Vitamin (MULTIVITAMIN) tablet Take 1 tablet by mouth daily.    . promethazine  (PHENERGAN) 25 MG tablet TAKE 1 TABLET BY MOUTH EVERY 8 HOURS AS NEEDED FOR NAUSEA FOR UP TO 10 DAYS 30 tablet 0   No current facility-administered medications for this visit.    OBJECTIVE: Vitals:   11/14/20 1307  BP: 126/85  Pulse: 85  Temp: (!) 97.4 F (36.3 C)     Body mass index is 40.51 kg/m.    ECOG FS:0 - Asymptomatic  General: Well-developed, well-nourished, no acute distress. Eyes: Pink conjunctiva, anicteric sclera. HEENT: Normocephalic, moist mucous membranes. Lungs: No audible wheezing or coughing. Heart: Regular rate and rhythm. Abdomen: Soft, nontender, no obvious distention. Musculoskeletal: No edema, cyanosis, or clubbing. Neuro: Alert, answering all questions appropriately. Cranial nerves grossly intact. Skin: No rashes or petechiae noted. Psych: Normal affect.   LAB RESULTS:  Lab Results  Component Value  Date   NA 138 05/26/2020   K 4.6 05/26/2020   CL 103 05/26/2020   CO2 25 05/26/2020   GLUCOSE 104 (H) 05/26/2020   BUN 8 05/26/2020   CREATININE 0.68 05/26/2020   CALCIUM 8.6 (L) 05/26/2020   PROT 7.6 05/26/2020   ALBUMIN 3.4 (L) 05/26/2020   AST 32 05/26/2020   ALT 25 05/26/2020   ALKPHOS 94 05/26/2020   BILITOT 0.7 05/26/2020   GFRNONAA >60 05/26/2020   GFRAA 102 04/07/2020    Lab Results  Component Value Date   WBC 6.8 11/13/2020   NEUTROABS 4.2 11/13/2020   HGB 10.9 (L) 11/13/2020   HCT 34.8 (L) 11/13/2020   MCV 88.5 11/13/2020   PLT 324 11/13/2020   Lab Results  Component Value Date   IRON 26 (L) 11/13/2020   TIBC 539 (H) 11/13/2020   IRONPCTSAT 5 (L) 11/13/2020   Lab Results  Component Value Date   FERRITIN 7 (L) 11/13/2020     STUDIES: No results found.  ASSESSMENT: Iron deficiency anemia, heterozygous for prothrombin gene mutation.  PLAN:  1. Iron deficiency anemia: Likely secondary to poor absorption from patient's history of gastric bypass surgery.  Patient's hemoglobin has improved since receiving IV Feraheme,  but her iron stores remain decreased and essentially unchanged.  Proceed with 510 mg IV Feraheme today and return to clinic on Friday for second infusion.  Patient will then return to clinic the first week of June after school is out for further evaluation and continuation of treatment if needed.   2.  Heterozygous for prothrombin gene mutation: Patient had a unprovoked right leg DVT in October 2021 and has now completed 6 months of Eliquis treatment.  Previously, patient underwent gastric bypass surgery and 2 healthy pregnancies with out issue.  After lengthy discussion with the patient, she understands the risks and benefits of discontinuing anticoagulation versus lifelong treatment.  She has elected to discontinue Eliquis, but expressed understanding that if she had a second DVT under any circumstances would recommend lifelong treatment at that time.    I spent a total of 30 minutes reviewing chart data, face-to-face evaluation with the patient, counseling and coordination of care as detailed above.  Patient expressed understanding and was in agreement with this plan. She also understands that She can call clinic at any time with any questions, concerns, or complaints.    Jeralyn Ruths, MD   11/15/2020 10:24 AM

## 2020-11-13 ENCOUNTER — Inpatient Hospital Stay: Payer: 59 | Attending: Oncology

## 2020-11-13 ENCOUNTER — Other Ambulatory Visit: Payer: Self-pay

## 2020-11-13 ENCOUNTER — Other Ambulatory Visit: Payer: Self-pay | Admitting: Oncology

## 2020-11-13 DIAGNOSIS — Z7901 Long term (current) use of anticoagulants: Secondary | ICD-10-CM | POA: Diagnosis not present

## 2020-11-13 DIAGNOSIS — E039 Hypothyroidism, unspecified: Secondary | ICD-10-CM | POA: Insufficient documentation

## 2020-11-13 DIAGNOSIS — D509 Iron deficiency anemia, unspecified: Secondary | ICD-10-CM | POA: Insufficient documentation

## 2020-11-13 DIAGNOSIS — D6852 Prothrombin gene mutation: Secondary | ICD-10-CM | POA: Diagnosis not present

## 2020-11-13 LAB — CBC WITH DIFFERENTIAL/PLATELET
Abs Immature Granulocytes: 0.02 10*3/uL (ref 0.00–0.07)
Basophils Absolute: 0 10*3/uL (ref 0.0–0.1)
Basophils Relative: 1 %
Eosinophils Absolute: 0.1 10*3/uL (ref 0.0–0.5)
Eosinophils Relative: 2 %
HCT: 34.8 % — ABNORMAL LOW (ref 36.0–46.0)
Hemoglobin: 10.9 g/dL — ABNORMAL LOW (ref 12.0–15.0)
Immature Granulocytes: 0 %
Lymphocytes Relative: 27 %
Lymphs Abs: 1.9 10*3/uL (ref 0.7–4.0)
MCH: 27.7 pg (ref 26.0–34.0)
MCHC: 31.3 g/dL (ref 30.0–36.0)
MCV: 88.5 fL (ref 80.0–100.0)
Monocytes Absolute: 0.6 10*3/uL (ref 0.1–1.0)
Monocytes Relative: 8 %
Neutro Abs: 4.2 10*3/uL (ref 1.7–7.7)
Neutrophils Relative %: 62 %
Platelets: 324 10*3/uL (ref 150–400)
RBC: 3.93 MIL/uL (ref 3.87–5.11)
RDW: 15 % (ref 11.5–15.5)
WBC: 6.8 10*3/uL (ref 4.0–10.5)
nRBC: 0 % (ref 0.0–0.2)

## 2020-11-13 LAB — IRON AND TIBC
Iron: 26 ug/dL — ABNORMAL LOW (ref 28–170)
Saturation Ratios: 5 % — ABNORMAL LOW (ref 10.4–31.8)
TIBC: 539 ug/dL — ABNORMAL HIGH (ref 250–450)
UIBC: 513 ug/dL

## 2020-11-13 LAB — FERRITIN: Ferritin: 7 ng/mL — ABNORMAL LOW (ref 11–307)

## 2020-11-14 ENCOUNTER — Inpatient Hospital Stay (HOSPITAL_BASED_OUTPATIENT_CLINIC_OR_DEPARTMENT_OTHER): Payer: 59 | Admitting: Oncology

## 2020-11-14 ENCOUNTER — Inpatient Hospital Stay: Payer: 59

## 2020-11-14 VITALS — BP 126/85 | HR 85 | Temp 97.4°F | Wt 251.0 lb

## 2020-11-14 DIAGNOSIS — Z7901 Long term (current) use of anticoagulants: Secondary | ICD-10-CM | POA: Diagnosis not present

## 2020-11-14 DIAGNOSIS — E039 Hypothyroidism, unspecified: Secondary | ICD-10-CM | POA: Diagnosis not present

## 2020-11-14 DIAGNOSIS — D509 Iron deficiency anemia, unspecified: Secondary | ICD-10-CM | POA: Diagnosis not present

## 2020-11-14 DIAGNOSIS — D6852 Prothrombin gene mutation: Secondary | ICD-10-CM | POA: Diagnosis not present

## 2020-11-14 DIAGNOSIS — I82411 Acute embolism and thrombosis of right femoral vein: Secondary | ICD-10-CM

## 2020-11-14 MED ORDER — SODIUM CHLORIDE 0.9 % IV SOLN
Freq: Once | INTRAVENOUS | Status: AC
Start: 2020-11-14 — End: 2020-11-14
  Filled 2020-11-14: qty 250

## 2020-11-14 MED ORDER — SODIUM CHLORIDE 0.9 % IV SOLN: 200.0000 mg | Freq: Once | INTRAVENOUS | Status: DC

## 2020-11-14 MED ORDER — IRON SUCROSE 20 MG/ML IV SOLN
200.0000 mg | Freq: Once | INTRAVENOUS | Status: AC
Start: 2020-11-14 — End: 2020-11-14
  Administered 2020-11-14: 200 mg via INTRAVENOUS

## 2020-11-17 ENCOUNTER — Inpatient Hospital Stay: Payer: 59

## 2020-12-06 ENCOUNTER — Other Ambulatory Visit: Payer: Self-pay | Admitting: Family Medicine

## 2020-12-06 ENCOUNTER — Other Ambulatory Visit: Payer: Self-pay

## 2020-12-06 DIAGNOSIS — M961 Postlaminectomy syndrome, not elsewhere classified: Secondary | ICD-10-CM

## 2020-12-06 DIAGNOSIS — I82411 Acute embolism and thrombosis of right femoral vein: Secondary | ICD-10-CM

## 2020-12-06 DIAGNOSIS — F1190 Opioid use, unspecified, uncomplicated: Secondary | ICD-10-CM

## 2020-12-06 MED FILL — Alprazolam Tab 1 MG: ORAL | 30 days supply | Qty: 90 | Fill #1 | Status: AC

## 2020-12-06 NOTE — Telephone Encounter (Signed)
Requested medication (s) are due for refill today: Yes  Requested medication (s) are on the active medication list: Yes  Last refill:  11/03/20  Future visit scheduled: No  Notes to clinic:  See request.    Requested Prescriptions  Pending Prescriptions Disp Refills   HYDROcodone-acetaminophen (NORCO) 10-325 MG tablet 90 tablet 0    Sig: TAKE 1 TABLET BY MOUTH EVERY EIGHT HOURS AS NEEDED      Not Delegated - Analgesics:  Opioid Agonist Combinations Failed - 12/06/2020  1:59 PM      Failed - This refill cannot be delegated      Passed - Urine Drug Screen completed in last 360 days      Passed - Valid encounter within last 6 months    Recent Outpatient Visits           5 months ago Deep venous thrombosis of right profunda femoris vein (HCC)   [facility] Malva Limes, MD   6 months ago Chronic low back pain, unspecified back pain laterality, unspecified whether sciatica present   [facility] Malva Limes, MD   7 months ago Chronic low back pain, unspecified back pain laterality, unspecified whether sciatica present   [facility] [place], Lavella Hammock, PA-C   8 months ago Annual physical exam   [facility] Osvaldo Angst M, [place]   8 months ago Cystitis   [facility] [place] [place], Ricki Rodriguez [place], [place]

## 2020-12-08 ENCOUNTER — Other Ambulatory Visit: Payer: Self-pay

## 2020-12-08 ENCOUNTER — Other Ambulatory Visit: Payer: Self-pay | Admitting: Family Medicine

## 2020-12-08 DIAGNOSIS — F1190 Opioid use, unspecified, uncomplicated: Secondary | ICD-10-CM

## 2020-12-08 DIAGNOSIS — M961 Postlaminectomy syndrome, not elsewhere classified: Secondary | ICD-10-CM

## 2020-12-08 DIAGNOSIS — I82411 Acute embolism and thrombosis of right femoral vein: Secondary | ICD-10-CM

## 2020-12-08 MED ORDER — CARESTART COVID-19 HOME TEST VI KIT
PACK | 0 refills | Status: DC
  Filled 2020-12-08: qty 4, 4d supply, fill #0

## 2020-12-08 MED FILL — Hydrocodone-Acetaminophen Tab 10-325 MG: ORAL | 30 days supply | Qty: 90 | Fill #0 | Status: AC

## 2020-12-08 NOTE — Telephone Encounter (Signed)
Requested medication (s) are due for refill today: yes  Requested medication (s) are on the active medication list: yes  Last refill:  11/06/2020  Future visit scheduled: no  Notes to clinic:  this refill cannot be delegated   Requested Prescriptions  Pending Prescriptions Disp Refills   HYDROcodone-acetaminophen (NORCO) 10-325 MG tablet 90 tablet 0    Sig: TAKE 1 TABLET BY MOUTH EVERY EIGHT HOURS AS NEEDED      Not Delegated - Analgesics:  Opioid Agonist Combinations Failed - 12/08/2020 10:11 AM      Failed - This refill cannot be delegated      Passed - Urine Drug Screen completed in last 360 days      Passed - Valid encounter within last 6 months    Recent Outpatient Visits           5 months ago Deep venous thrombosis of right profunda femoris vein (HCC)   [facility] Malva Limes, MD   6 months ago Chronic low back pain, unspecified back pain laterality, unspecified whether sciatica present   [facility] Malva Limes, MD   7 months ago Chronic low back pain, unspecified back pain laterality, unspecified whether sciatica present   [facility] [place], Lavella Hammock, PA-C   8 months ago Annual physical exam   [facility] Osvaldo Angst M, [place]   8 months ago Cystitis   [facility] [place], Ricki Rodriguez [place], [place]

## 2020-12-13 ENCOUNTER — Other Ambulatory Visit: Payer: Self-pay

## 2020-12-13 MED FILL — Lemborexant Tab 10 MG: ORAL | 30 days supply | Qty: 30 | Fill #0 | Status: AC

## 2020-12-14 ENCOUNTER — Other Ambulatory Visit: Payer: Self-pay

## 2020-12-20 ENCOUNTER — Other Ambulatory Visit: Payer: Self-pay

## 2021-01-04 ENCOUNTER — Inpatient Hospital Stay: Payer: 59 | Attending: Oncology | Admitting: Oncology

## 2021-01-04 ENCOUNTER — Other Ambulatory Visit: Payer: Self-pay | Admitting: Family Medicine

## 2021-01-04 ENCOUNTER — Other Ambulatory Visit: Payer: Self-pay

## 2021-01-04 DIAGNOSIS — M961 Postlaminectomy syndrome, not elsewhere classified: Secondary | ICD-10-CM

## 2021-01-04 DIAGNOSIS — F1190 Opioid use, unspecified, uncomplicated: Secondary | ICD-10-CM

## 2021-01-04 DIAGNOSIS — D509 Iron deficiency anemia, unspecified: Secondary | ICD-10-CM

## 2021-01-04 DIAGNOSIS — D6852 Prothrombin gene mutation: Secondary | ICD-10-CM | POA: Diagnosis not present

## 2021-01-04 DIAGNOSIS — I82411 Acute embolism and thrombosis of right femoral vein: Secondary | ICD-10-CM

## 2021-01-04 LAB — CBC WITH DIFFERENTIAL/PLATELET
Abs Immature Granulocytes: 0.02 10*3/uL (ref 0.00–0.07)
Basophils Absolute: 0.1 10*3/uL (ref 0.0–0.1)
Basophils Relative: 1 %
Eosinophils Absolute: 0.1 10*3/uL (ref 0.0–0.5)
Eosinophils Relative: 2 %
HCT: 37.2 % (ref 36.0–46.0)
Hemoglobin: 11.8 g/dL — ABNORMAL LOW (ref 12.0–15.0)
Immature Granulocytes: 0 %
Lymphocytes Relative: 47 %
Lymphs Abs: 4.1 10*3/uL — ABNORMAL HIGH (ref 0.7–4.0)
MCH: 28.5 pg (ref 26.0–34.0)
MCHC: 31.7 g/dL (ref 30.0–36.0)
MCV: 89.9 fL (ref 80.0–100.0)
Monocytes Absolute: 0.6 10*3/uL (ref 0.1–1.0)
Monocytes Relative: 8 %
Neutro Abs: 3.6 10*3/uL (ref 1.7–7.7)
Neutrophils Relative %: 42 %
Platelets: 280 10*3/uL (ref 150–400)
RBC: 4.14 MIL/uL (ref 3.87–5.11)
RDW: 16.9 % — ABNORMAL HIGH (ref 11.5–15.5)
WBC: 8.5 10*3/uL (ref 4.0–10.5)
nRBC: 0 % (ref 0.0–0.2)

## 2021-01-04 LAB — IRON AND TIBC
Iron: 35 ug/dL (ref 28–170)
Saturation Ratios: 7 % — ABNORMAL LOW (ref 10.4–31.8)
TIBC: 519 ug/dL — ABNORMAL HIGH (ref 250–450)
UIBC: 484 ug/dL

## 2021-01-04 LAB — FERRITIN: Ferritin: 8 ng/mL — ABNORMAL LOW (ref 11–307)

## 2021-01-04 MED ORDER — HYDROCODONE-ACETAMINOPHEN 10-325 MG PO TABS
ORAL_TABLET | ORAL | 0 refills | Status: DC
  Filled 2021-01-04 – 2021-01-05 (×2): qty 90, fill #0
  Filled 2021-01-08: qty 90, 30d supply, fill #0

## 2021-01-04 NOTE — Telephone Encounter (Signed)
Requested medication (s) are due for refill today: yes  Requested medication (s) are on the active medication list: yes  Last refill: 12/08/2020  Future visit scheduled: no  Notes to clinic : this refill cannot be delegated    Requested Prescriptions  Pending Prescriptions Disp Refills   HYDROcodone-acetaminophen (NORCO) 10-325 MG tablet 90 tablet 0    Sig: TAKE 1 TABLET BY MOUTH EVERY EIGHT HOURS AS NEEDED      Not Delegated - Analgesics:  Opioid Agonist Combinations Failed - 01/04/2021  7:42 AM      Failed - This refill cannot be delegated      Failed - Valid encounter within last 6 months    Recent Outpatient Visits           6 months ago Deep venous thrombosis of right profunda femoris vein (HCC)   [facility] Malva Limes, MD   7 months ago Chronic low back pain, unspecified back pain laterality, unspecified whether sciatica present   [facility] Malva Limes, MD   8 months ago Chronic low back pain, unspecified back pain laterality, unspecified whether sciatica present   [facility] Osvaldo Angst M, PA-C   9 months ago Annual physical exam   [facility] Osvaldo Angst M, [place]   9 months ago Cystitis   [facility] Osvaldo Angst M, [place]                Passed - Urine Drug Screen completed in last 360 days

## 2021-01-05 ENCOUNTER — Other Ambulatory Visit: Payer: Self-pay

## 2021-01-05 ENCOUNTER — Inpatient Hospital Stay (HOSPITAL_BASED_OUTPATIENT_CLINIC_OR_DEPARTMENT_OTHER): Payer: 59 | Admitting: Oncology

## 2021-01-05 ENCOUNTER — Inpatient Hospital Stay: Payer: 59

## 2021-01-05 ENCOUNTER — Encounter: Payer: Self-pay | Admitting: Oncology

## 2021-01-05 VITALS — BP 139/78 | HR 67 | Resp 16

## 2021-01-05 VITALS — BP 120/79 | HR 70 | Temp 98.7°F | Resp 16 | Wt 252.0 lb

## 2021-01-05 DIAGNOSIS — D6852 Prothrombin gene mutation: Secondary | ICD-10-CM

## 2021-01-05 DIAGNOSIS — D509 Iron deficiency anemia, unspecified: Secondary | ICD-10-CM | POA: Diagnosis not present

## 2021-01-05 MED ORDER — IRON SUCROSE 20 MG/ML IV SOLN
200.0000 mg | Freq: Once | INTRAVENOUS | Status: AC
Start: 2021-01-05 — End: 2021-01-05
  Administered 2021-01-05: 200 mg via INTRAVENOUS
  Filled 2021-01-05: qty 10

## 2021-01-05 MED ORDER — SODIUM CHLORIDE 0.9 % IV SOLN
Freq: Once | INTRAVENOUS | Status: AC
  Filled 2021-01-05: qty 250

## 2021-01-05 MED ORDER — SODIUM CHLORIDE 0.9 % IV SOLN: 200.0000 mg | Freq: Once | INTRAVENOUS | Status: DC

## 2021-01-05 NOTE — Progress Notes (Signed)
Pt in for follow up, reports legs started hurting again and was afraid she might be having a blood clot again and she restarted her Eliquis  5 mg rwice daily.

## 2021-01-05 NOTE — Progress Notes (Signed)
[facility]  Telephone:(336) [phone] Fax:(336) [phone]  ID: Michaela Jones OB: 1976/06/16  MR#: [id number]  AOZ#:[id number]  Patient Care Team: Birdie Sons, MD as PCP - General (Family Medicine) Bary Castilla Forest Gleason, MD (General Surgery)  CHIEF COMPLAINT: Iron deficiency anemia, heterozygous for prothrombin gene mutation.  INTERVAL HISTORY: Patient returns to clinic today for repeat laboratory work, further evaluation, and consideration of additional IV iron.   She last received iron on 11/14/20.   Today she complains of fatigue. No evidence of bleeding.  Reports some cramping in her right lower extremity.  She restarted her Eliquis because she was concerned that she was developing a blood clot.  Denies any chest pain or shortness of breath.  Denies any acute concerns.  She has questions about her daughter who is 76 and recently tested positive for the prothrombin gene mutation.  REVIEW OF SYSTEMS:   Review of Systems  Constitutional:  Positive for malaise/fatigue. Negative for fever and weight loss.  Respiratory: Negative.  Negative for cough, hemoptysis and shortness of breath.   Cardiovascular:  Positive for leg swelling (RLE). Negative for chest pain.  Gastrointestinal: Negative.  Negative for abdominal pain, blood in stool and melena.  Genitourinary: Negative.  Negative for hematuria.  Musculoskeletal:  Negative for back pain.  Skin:  Negative for rash.  Neurological: Negative.  Negative for dizziness, focal weakness, weakness and headaches.  Psychiatric/Behavioral: Negative.  The patient is not nervous/anxious.    As per HPI. Otherwise, a complete review of systems is negative.  PAST MEDICAL HISTORY: Past Medical History:  Diagnosis Date   Anemia    Chronic back pain    Complication of anesthesia    HARD TO WAKE UP/OXYGEN LEVEL DROPPED AFTER BACK SURGERY X1   Constipation, chronic    Depression    DVT (deep venous thrombosis) (HCC)    Edema     History of seasonal allergies    pt has inhalers for this if needed   Hypotension    Hypothyroid    Insomnia    Lymphedema of both lower extremities    Migraine    Restless leg syndrome    Vitamin D deficiency     PAST SURGICAL HISTORY: Past Surgical History:  Procedure Laterality Date   ABDOMINOPLASTY  2004   ABLATION  2014   BACK SURGERY  10/2012   BREAST ENHANCEMENT SURGERY  07/2008   BREAST SURGERY  09/05/2011   removal of implants   CHOLECYSTECTOMY N/A 04/16/2016   Procedure: LAPAROSCOPIC CHOLECYSTECTOMY WITH INTRAOPERATIVE CHOLANGIOGRAM;  Surgeon: Robert Bellow, MD;  Location: [facility] ORS;  Service: General;  Laterality: N/A;   GASTRIC BYPASS  2002   Lower body  01/2008, 03/2008   lift legs and thighs    FAMILY HISTORY: Family History  Problem Relation Age of Onset   Pulmonary embolism Mother    Depression Mother    Asthma Mother    Heart attack Father    Cancer Father     ADVANCED DIRECTIVES (Y/N):  N  HEALTH MAINTENANCE: Social History   Tobacco Use   Smoking status: Never   Smokeless tobacco: Never  Substance Use Topics   Alcohol use: Yes    Alcohol/week: 0.0 standard drinks    Comment: Rare   Drug use: No     Colonoscopy:  PAP:  Bone density:  Lipid panel:  Allergies  Allergen Reactions   Lyrica [Pregabalin] Other (See Comments)    Patient reports neurological problem. Speech disturbances and unable to  control motor skills   Gabapentin Swelling   Nsaids Other (See Comments)    Avoids due to gastric bypass Avoids due to gastric bypass   Penicillins Rash    Current Outpatient Medications  Medication Sig Dispense Refill   albuterol (PROVENTIL HFA;VENTOLIN HFA) 108 (90 Base) MCG/ACT inhaler Inhale 2 puffs into the lungs every 6 (six) hours as needed for wheezing or shortness of breath.     albuterol (PROVENTIL) (5 MG/ML) 0.5% nebulizer solution Inhale into the lungs.     ALPRAZolam (XANAX) 1 MG tablet TAKE 1/2 TO 1 TABLET BY MOUTH THREE  TIMES DAILY AS NEEDED FOR ANXIETY 90 tablet 5   apixaban (ELIQUIS) 5 MG TABS tablet TAKE 1 TABLET BY MOUTH TWICE DAILY 60 tablet 2   Armodafinil 150 MG tablet TAKE 1 TABLET BY MOUTH EVERY MORNING 30 tablet 5   cetirizine (ZYRTEC) 10 MG tablet Take 10 mg by mouth daily as needed for allergies.     Cholecalciferol (VITAMIN D) 50 MCG (2000 UT) CAPS Take by mouth.     cyanocobalamin (,VITAMIN B-12,) 1000 MCG/ML injection INJECT 1 MILLILITER (ML) ONCE MONTHLY 6 mL 4   fluticasone (FLONASE) 50 MCG/ACT nasal spray Place 2 sprays into both nostrils daily. 16 g 6   HYDROcodone-acetaminophen (NORCO) 10-325 MG tablet TAKE 1 TABLET BY MOUTH EVERY EIGHT HOURS AS NEEDED 90 tablet 0   levothyroxine (SYNTHROID) 125 MCG tablet TAKE 1 TABLET BY MOUTH DAILY. 90 tablet 0   Multiple Vitamin (MULTIVITAMIN) tablet Take 1 tablet by mouth daily.     COVID-19 At Home Antigen Test [facility] COVID-19 HOME TEST) KIT Use as directed (Patient not taking: Reported on 01/05/2021) 4 each 0   Insulin Pen Needle (NOVOFINE PEN NEEDLE) 32G X 6 MM MISC Use daily with saxenda. (Patient not taking: Reported on 01/05/2021) 100 each 0   Insulin Pen Needle 31G X 6 MM MISC USE DAILY WITH SAXENDA. (Patient not taking: Reported on 01/05/2021) 100 each 0   Lemborexant 10 MG TABS TAKE 1 TABLET BY MOUTH AT BEDTIME. (Patient not taking: No sig reported) 30 tablet 5   Liraglutide -Weight Management 18 MG/3ML SOPN INJECT 0.6MG INTO THE SKIN DAILY FOR 2 WEEKS THEN INCREASE TO 1.2MG FOR 2 WEEKS (TARGET DOSE: 3MG/DAY). PT TO FOLLOW UP IN 4 WEEKS. (Patient not taking: No sig reported) 15 mL 2   meloxicam (MOBIC) 15 MG tablet TAKE 1 TABLET BY MOUTH DAILY. (Patient not taking: No sig reported) 30 tablet 0   promethazine (PHENERGAN) 25 MG tablet TAKE 1 TABLET BY MOUTH EVERY 8 HOURS AS NEEDED FOR NAUSEA FOR UP TO 10 DAYS (Patient not taking: Reported on 01/05/2021) 30 tablet 0   No current facility-administered medications for this visit.     OBJECTIVE: Vitals:   01/05/21 1145 01/05/21 1147  BP: 120/79 120/79  Pulse: 70 70  Resp: 16 16  Temp: 98.7 F (37.1 C) 98.7 F (37.1 C)  SpO2: 98% 98%     Body mass index is 40.67 kg/m.    ECOG FS:0 - Asymptomatic  Physical Exam Constitutional:      Appearance: Normal appearance. She is obese.  HENT:     Head: Normocephalic and atraumatic.  Eyes:     Pupils: Pupils are equal, round, and reactive to light.  Cardiovascular:     Rate and Rhythm: Normal rate and regular rhythm.     Heart sounds: Normal heart sounds. No murmur heard. Pulmonary:     Effort: Pulmonary effort is normal.  Breath sounds: Normal breath sounds. No wheezing.  Abdominal:     General: Bowel sounds are normal. There is no distension.     Palpations: Abdomen is soft.     Tenderness: There is no abdominal tenderness.  Musculoskeletal:        General: Normal range of motion.     Cervical back: Normal range of motion.  Skin:    General: Skin is warm and dry.     Findings: No rash.  Neurological:     Mental Status: She is alert and oriented to person, place, and time.  Psychiatric:        Judgment: Judgment normal.     LAB RESULTS:  Lab Results  Component Value Date   NA 138 05/26/2020   K 4.6 05/26/2020   CL 103 05/26/2020   CO2 25 05/26/2020   GLUCOSE 104 (H) 05/26/2020   BUN 8 05/26/2020   CREATININE 0.68 05/26/2020   CALCIUM 8.6 (L) 05/26/2020   PROT 7.6 05/26/2020   ALBUMIN 3.4 (L) 05/26/2020   AST 32 05/26/2020   ALT 25 05/26/2020   ALKPHOS 94 05/26/2020   BILITOT 0.7 05/26/2020   GFRNONAA >60 05/26/2020   GFRAA 102 04/07/2020    Lab Results  Component Value Date   WBC 8.5 01/04/2021   NEUTROABS 3.6 01/04/2021   HGB 11.8 (L) 01/04/2021   HCT 37.2 01/04/2021   MCV 89.9 01/04/2021   PLT 280 01/04/2021   Lab Results  Component Value Date   IRON 35 01/04/2021   TIBC 519 (H) 01/04/2021   IRONPCTSAT 7 (L) 01/04/2021   Lab Results  Component Value Date   FERRITIN 8  (L) 01/04/2021     STUDIES: No results found.  ASSESSMENT: Iron deficiency anemia, heterozygous for prothrombin gene mutation.  PLAN:  1. Iron deficiency anemia:  Likely secondary to poor absorption from patient's history of gastric bypass surgery.   Labs from 01/04/2021 show hemoglobin of 11.8, ferritin 8, iron saturation 7 and TIBC of 519. Proceed with 200 mg IV Venofer.  She will require a total of 5 doses. RTC in 3 months for labs, MD assessment and possible IV Venofer.  2.  Heterozygous for prothrombin gene mutation:  Patient had a unprovoked right leg DVT in October 2021 and completed 6 months of Eliquis treatment.   She discontinued Eliquis in April 2022. She restarted Eliquis approximately 1 month ago due to some cramping in her right leg. I have encouraged her to continue Eliquis until she sees Dr. Grayland Ormond in about 3 months. Patient has questions regarding her 85 year old daughter who recently tested positive for the prothrombin gene mutation.  We unfortunately do not see patients under the age of 27, recommend pediatric hematology.   Disposition: RTC in 3 months with repeat labs (ferritin, iron, CBC), MD assessment and possible IV Venofer.  Greater than 50% was spent in counseling and coordination of care with this patient including but not limited to discussion of the relevant topics above (See A&P) including, but not limited to diagnosis and management of acute and chronic medical conditions.   Patient expressed understanding and was in agreement with this plan. She also understands that She can call clinic at any time with any questions, concerns, or complaints.    Jacquelin Hawking, NP   01/05/2021 1:13 PM

## 2021-01-08 ENCOUNTER — Other Ambulatory Visit: Payer: Self-pay | Admitting: Psychiatry

## 2021-01-08 ENCOUNTER — Other Ambulatory Visit: Payer: Self-pay

## 2021-01-08 DIAGNOSIS — F5101 Primary insomnia: Secondary | ICD-10-CM

## 2021-01-08 DIAGNOSIS — F411 Generalized anxiety disorder: Secondary | ICD-10-CM

## 2021-01-08 NOTE — Telephone Encounter (Signed)
Last filled 5/11 appt on 01/30/21

## 2021-01-09 MED FILL — Alprazolam Tab 1 MG: ORAL | 30 days supply | Qty: 90 | Fill #0 | Status: AC

## 2021-01-10 ENCOUNTER — Ambulatory Visit: Payer: 59 | Admitting: Psychiatry

## 2021-01-10 ENCOUNTER — Other Ambulatory Visit: Payer: Self-pay

## 2021-01-11 ENCOUNTER — Inpatient Hospital Stay: Payer: 59

## 2021-01-11 VITALS — BP 130/84 | HR 68 | Resp 18

## 2021-01-11 DIAGNOSIS — D509 Iron deficiency anemia, unspecified: Secondary | ICD-10-CM | POA: Diagnosis not present

## 2021-01-11 DIAGNOSIS — D6852 Prothrombin gene mutation: Secondary | ICD-10-CM | POA: Diagnosis not present

## 2021-01-11 MED ORDER — SODIUM CHLORIDE 0.9 % IV SOLN
Freq: Once | INTRAVENOUS | Status: AC
  Filled 2021-01-11: qty 250

## 2021-01-11 MED ORDER — SODIUM CHLORIDE 0.9 % IV SOLN: 200.0000 mg | Freq: Once | INTRAVENOUS | Status: DC

## 2021-01-11 MED ORDER — IRON SUCROSE 20 MG/ML IV SOLN
200.0000 mg | Freq: Once | INTRAVENOUS | Status: AC
  Administered 2021-01-11: 200 mg via INTRAVENOUS
  Filled 2021-01-11: qty 10

## 2021-01-11 NOTE — Patient Instructions (Signed)
[facility]  Discharge Instructions: Thank you for choosing [facility] to provide your oncology and hematology care.  If you have a lab appointment with the Cancer Center, please go directly to the Cancer Center and check in at the registration area.  Wear comfortable clothing and clothing appropriate for easy access to any Portacath or PICC line.   We strive to give you quality time with your provider. You may need to reschedule your appointment if you arrive late (15 or more minutes).  Arriving late affects you and other patients whose appointments are after yours.  Also, if you miss three or more appointments without notifying the office, you may be dismissed from the clinic at the provider's discretion.      For prescription refill requests, have your pharmacy contact our office and allow 72 hours for refills to be completed.    Today you received the following : Venofer   To help prevent nausea and vomiting after your treatment, we encourage you to take your nausea medication as directed.  BELOW ARE SYMPTOMS THAT SHOULD BE REPORTED IMMEDIATELY: . *FEVER GREATER THAN 100.4 F (38 C) OR HIGHER . *CHILLS OR SWEATING . *NAUSEA AND VOMITING THAT IS NOT CONTROLLED WITH YOUR NAUSEA MEDICATION . *UNUSUAL SHORTNESS OF BREATH . *UNUSUAL BRUISING OR BLEEDING . *URINARY PROBLEMS (pain or burning when urinating, or frequent urination) . *BOWEL PROBLEMS (unusual diarrhea, constipation, pain near the anus) . TENDERNESS IN MOUTH AND THROAT WITH OR WITHOUT PRESENCE OF ULCERS (sore throat, sores in mouth, or a toothache) . UNUSUAL RASH, SWELLING OR PAIN  . UNUSUAL VAGINAL DISCHARGE OR ITCHING   Items with * indicate a potential emergency and should be followed up as soon as possible or go to the Emergency Department if any problems should occur.  Please show the CHEMOTHERAPY ALERT CARD or IMMUNOTHERAPY ALERT CARD at check-in to the Emergency  Department and triage nurse.  Should you have questions after your visit or need to cancel or reschedule your appointment, please contact [facility] [place] [facility]  [phone] and follow the prompts.  Office hours are 8:00 a.m. to 4:30 p.m. Monday - Friday. Please note that voicemails left after 4:00 p.m. may not be returned until the following business day.  We are closed weekends and major holidays. You have access to a nurse at all times for urgent questions. Please call the main number to the clinic [phone] and follow the prompts.  For any non-urgent questions, you may also contact your provider using MyChart. We now offer e-Visits for anyone 18 and older to request care online for non-urgent symptoms. For details visit [email or web address].   Also download the MyChart app! Go to the app store, search "MyChart", open the app, select [facility], and log in with your MyChart username and password.  Due to Covid, a mask is required upon entering the hospital/clinic. If you do not have a mask, one will be given to you upon arrival. For doctor visits, patients may have 1 support person aged 18 or older with them. For treatment visits, patients cannot have anyone with them due to current Covid guidelines and our immunocompromised population.  

## 2021-01-15 ENCOUNTER — Inpatient Hospital Stay: Payer: 59

## 2021-01-15 ENCOUNTER — Other Ambulatory Visit: Payer: Self-pay

## 2021-01-15 VITALS — BP 112/77 | HR 70

## 2021-01-15 DIAGNOSIS — D509 Iron deficiency anemia, unspecified: Secondary | ICD-10-CM

## 2021-01-15 DIAGNOSIS — D6852 Prothrombin gene mutation: Secondary | ICD-10-CM | POA: Diagnosis not present

## 2021-01-15 MED ORDER — IRON SUCROSE 20 MG/ML IV SOLN
200.0000 mg | Freq: Once | INTRAVENOUS | Status: AC
  Administered 2021-01-15: 200 mg via INTRAVENOUS
  Filled 2021-01-15: qty 10

## 2021-01-15 MED ORDER — SODIUM CHLORIDE 0.9 % IV SOLN
INTRAVENOUS | Status: DC
  Filled 2021-01-15: qty 250

## 2021-01-15 MED ORDER — SODIUM CHLORIDE 0.9 % IV SOLN: 200.0000 mg | Freq: Once | INTRAVENOUS | Status: DC

## 2021-01-15 NOTE — Patient Instructions (Signed)
[facility] [place] [facility]  Discharge Instructions: Thank you for choosing [facility] to provide your oncology and hematology care.  If you have a lab appointment with the Cancer Center, please go directly to the Cancer Center and check in at the registration area.  Wear comfortable clothing and clothing appropriate for easy access to any Portacath or PICC line.   We strive to give you quality time with your provider. You may need to reschedule your appointment if you arrive late (15 or more minutes).  Arriving late affects you and other patients whose appointments are after yours.  Also, if you miss three or more appointments without notifying the office, you may be dismissed from the clinic at the provider's discretion.      For prescription refill requests, have your pharmacy contact our office and allow 72 hours for refills to be completed.    Today you received the following : Venofer   To help prevent nausea and vomiting after your treatment, we encourage you to take your nausea medication as directed.  BELOW ARE SYMPTOMS THAT SHOULD BE REPORTED IMMEDIATELY: . *FEVER GREATER THAN 100.4 F (38 C) OR HIGHER . *CHILLS OR SWEATING . *NAUSEA AND VOMITING THAT IS NOT CONTROLLED WITH YOUR NAUSEA MEDICATION . *UNUSUAL SHORTNESS OF BREATH . *UNUSUAL BRUISING OR BLEEDING . *URINARY PROBLEMS (pain or burning when urinating, or frequent urination) . *BOWEL PROBLEMS (unusual diarrhea, constipation, pain near the anus) . TENDERNESS IN MOUTH AND THROAT WITH OR WITHOUT PRESENCE OF ULCERS (sore throat, sores in mouth, or a toothache) . UNUSUAL RASH, SWELLING OR PAIN  . UNUSUAL VAGINAL DISCHARGE OR ITCHING   Items with * indicate a potential emergency and should be followed up as soon as possible or go to the Emergency Department if any problems should occur.  Please show the CHEMOTHERAPY ALERT CARD or IMMUNOTHERAPY ALERT CARD at check-in to the Emergency  Department and triage nurse.  Should you have questions after your visit or need to cancel or reschedule your appointment, please contact [facility]  [phone] and follow the prompts.  Office hours are 8:00 a.m. to 4:30 p.m. Monday - Friday. Please note that voicemails left after 4:00 p.m. may not be returned until the following business day.  We are closed weekends and major holidays. You have access to a nurse at all times for urgent questions. Please call the main number to the clinic [phone] and follow the prompts.  For any non-urgent questions, you may also contact your provider using MyChart. We now offer e-Visits for anyone 18 and older to request care online for non-urgent symptoms. For details visit [email or web address].   Also download the MyChart app! Go to the app store, search "MyChart", open the app, select [facility], and log in with your MyChart username and password.  Due to Covid, a mask is required upon entering the hospital/clinic. If you do not have a mask, one will be given to you upon arrival. For doctor visits, patients may have 1 support person aged 18 or older with them. For treatment visits, patients cannot have anyone with them due to current Covid guidelines and our immunocompromised population.  

## 2021-01-17 ENCOUNTER — Inpatient Hospital Stay: Payer: 59

## 2021-01-17 VITALS — BP 119/77 | HR 78 | Temp 97.0°F | Resp 18

## 2021-01-17 DIAGNOSIS — D509 Iron deficiency anemia, unspecified: Secondary | ICD-10-CM

## 2021-01-17 DIAGNOSIS — D6852 Prothrombin gene mutation: Secondary | ICD-10-CM | POA: Diagnosis not present

## 2021-01-17 MED ORDER — SODIUM CHLORIDE 0.9 % IV SOLN: 200.0000 mg | Freq: Once | INTRAVENOUS | Status: DC

## 2021-01-17 MED ORDER — SODIUM CHLORIDE 0.9 % IV SOLN
Freq: Once | INTRAVENOUS | Status: AC
  Filled 2021-01-17: qty 250

## 2021-01-17 MED ORDER — IRON SUCROSE 20 MG/ML IV SOLN
200.0000 mg | Freq: Once | INTRAVENOUS | Status: AC
  Administered 2021-01-17: 200 mg via INTRAVENOUS
  Filled 2021-01-17: qty 10

## 2021-01-17 NOTE — Patient Instructions (Signed)

## 2021-01-19 ENCOUNTER — Inpatient Hospital Stay: Payer: 59

## 2021-01-19 ENCOUNTER — Other Ambulatory Visit: Payer: Self-pay

## 2021-01-19 VITALS — BP 121/79 | HR 74 | Temp 97.7°F | Resp 18

## 2021-01-19 DIAGNOSIS — D6852 Prothrombin gene mutation: Secondary | ICD-10-CM | POA: Diagnosis not present

## 2021-01-19 DIAGNOSIS — D509 Iron deficiency anemia, unspecified: Secondary | ICD-10-CM | POA: Diagnosis not present

## 2021-01-19 MED ORDER — SODIUM CHLORIDE 0.9 % IV SOLN: 200.0000 mg | Freq: Once | INTRAVENOUS | Status: DC

## 2021-01-19 MED ORDER — SODIUM CHLORIDE 0.9 % IV SOLN
Freq: Once | INTRAVENOUS | Status: AC
  Filled 2021-01-19: qty 250

## 2021-01-19 MED ORDER — IRON SUCROSE 20 MG/ML IV SOLN
200.0000 mg | Freq: Once | INTRAVENOUS | Status: AC
  Administered 2021-01-19: 200 mg via INTRAVENOUS
  Filled 2021-01-19: qty 10

## 2021-01-22 ENCOUNTER — Other Ambulatory Visit: Payer: Self-pay | Admitting: Psychiatry

## 2021-01-22 DIAGNOSIS — F5101 Primary insomnia: Secondary | ICD-10-CM

## 2021-01-23 ENCOUNTER — Other Ambulatory Visit: Payer: Self-pay

## 2021-01-24 MED ORDER — DAYVIGO 10 MG PO TABS: 1.0000 | ORAL_TABLET | Freq: Every day | ORAL | 0 refills | Status: DC

## 2021-01-24 NOTE — Telephone Encounter (Signed)
Last filled 5/25 appt 7/5

## 2021-01-30 ENCOUNTER — Ambulatory Visit: Payer: 59 | Admitting: Psychiatry

## 2021-02-02 ENCOUNTER — Other Ambulatory Visit: Payer: Self-pay

## 2021-02-02 ENCOUNTER — Other Ambulatory Visit: Payer: Self-pay | Admitting: Family Medicine

## 2021-02-02 DIAGNOSIS — I82411 Acute embolism and thrombosis of right femoral vein: Secondary | ICD-10-CM

## 2021-02-02 DIAGNOSIS — F1190 Opioid use, unspecified, uncomplicated: Secondary | ICD-10-CM

## 2021-02-02 DIAGNOSIS — M961 Postlaminectomy syndrome, not elsewhere classified: Secondary | ICD-10-CM

## 2021-02-02 MED ORDER — PROMETHAZINE HCL 25 MG PO TABS
ORAL_TABLET | ORAL | 0 refills | Status: DC
  Filled 2021-02-02: qty 30, 10d supply, fill #0

## 2021-02-02 MED FILL — Hydrocodone-Acetaminophen Tab 10-325 MG: ORAL | Qty: 90 | Fill #0 | Status: CN

## 2021-02-02 NOTE — Telephone Encounter (Signed)
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 vein (HCC)   [facility] Malva Limes, MD   8 months ago Chronic low back pain, unspecified back pain laterality, unspecified whether sciatica present   [facility] Malva Limes, MD   9 months ago Chronic low back pain, unspecified back pain laterality, unspecified whether sciatica present   [facility] Osvaldo Angst M, PA-C   10 months ago Annual physical exam   [facility] Osvaldo Angst M, [place]   10 months ago Cystitis   [facility] Osvaldo Angst M, [place]                Passed - Urine Drug Screen completed in last 360 days

## 2021-02-02 NOTE — Telephone Encounter (Signed)
  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)   [facility] Malva Limes, MD   8 months ago Chronic low back pain, unspecified back pain laterality, unspecified whether sciatica present   [facility] Malva Limes, MD   9 months ago Chronic low back pain, unspecified back pain laterality, unspecified whether sciatica present   [facility] Osvaldo Angst M, PA-C   10 months ago Annual physical exam   [facility] Osvaldo Angst M, [place]   10 months ago Cystitis   [facility] Osvaldo Angst M, [place]                Passed - Urine Drug Screen completed in last 360 days

## 2021-02-02 NOTE — Telephone Encounter (Signed)
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

## 2021-02-05 ENCOUNTER — Other Ambulatory Visit: Payer: Self-pay

## 2021-02-05 MED FILL — Hydrocodone-Acetaminophen Tab 10-325 MG: ORAL | 30 days supply | Qty: 90 | Fill #0 | Status: AC

## 2021-02-06 ENCOUNTER — Other Ambulatory Visit: Payer: Self-pay

## 2021-02-06 ENCOUNTER — Other Ambulatory Visit: Payer: Self-pay | Admitting: Psychiatry

## 2021-02-06 DIAGNOSIS — F5101 Primary insomnia: Secondary | ICD-10-CM

## 2021-02-06 DIAGNOSIS — F411 Generalized anxiety disorder: Secondary | ICD-10-CM

## 2021-02-06 MED ORDER — ALPRAZOLAM 1 MG PO TABS
ORAL_TABLET | ORAL | 0 refills | Status: DC
  Filled 2021-02-06: qty 90, fill #0
  Filled 2021-02-08: qty 90, 30d supply, fill #0

## 2021-02-06 MED ORDER — DAYVIGO 10 MG PO TABS
1.0000 | ORAL_TABLET | Freq: Every day | ORAL | 5 refills | Status: DC
  Filled 2021-02-06: qty 30, 30d supply, fill #0
  Filled 2021-03-05: qty 30, 30d supply, fill #1
  Filled 2021-04-02: qty 30, 30d supply, fill #2
  Filled 2021-04-30: qty 30, 30d supply, fill #3
  Filled 2021-05-31: qty 30, 30d supply, fill #4
  Filled 2021-07-01: qty 30, 30d supply, fill #5

## 2021-02-08 ENCOUNTER — Other Ambulatory Visit: Payer: Self-pay

## 2021-02-12 ENCOUNTER — Other Ambulatory Visit: Payer: Self-pay

## 2021-02-12 ENCOUNTER — Other Ambulatory Visit: Payer: Self-pay | Admitting: Family Medicine

## 2021-02-12 DIAGNOSIS — E039 Hypothyroidism, unspecified: Secondary | ICD-10-CM

## 2021-02-12 NOTE — Telephone Encounter (Signed)
Requested medication (s) are due for refill today:  no  Requested medication (s) are on the active medication list: yes   Last refill:  08/17/2020  Future visit scheduled: no  Notes to clinic: Failed protocol: TSH needs to be rechecked within 3 months after an abnormal result. Refill until TSH is due   Requested Prescriptions  Pending Prescriptions Disp Refills   levothyroxine (SYNTHROID) 125 MCG tablet 90 tablet 0    Sig: TAKE 1 TABLET BY MOUTH DAILY.      Endocrinology:  Hypothyroid Agents Failed - 02/12/2021  9:49 AM      Failed - TSH needs to be rechecked within 3 months after an abnormal result. Refill until TSH is due.      Failed - TSH in normal range and within 360 days    TSH  Date Value Ref Range Status  04/07/2020 20.500 (H) 0.450 - 4.500 uIU/mL Final          Passed - Valid encounter within last 12 months    Recent Outpatient Visits           8 months ago Deep venous thrombosis of right profunda femoris vein [facility])   [facility] Malva Limes, MD   9 months ago Chronic low back pain, unspecified back pain laterality, unspecified whether sciatica present   [facility] Malva Limes, MD   9 months ago Chronic low back pain, unspecified back pain laterality, unspecified whether sciatica present   [facility] Osvaldo Angst M, PA-C   10 months ago Annual physical exam   [facility] Osvaldo Angst M, [place]   10 months ago Cystitis   [facility], Ricki Rodriguez [place], [place]

## 2021-02-13 ENCOUNTER — Other Ambulatory Visit: Payer: Self-pay

## 2021-02-13 MED FILL — Levothyroxine Sodium Tab 125 MCG: ORAL | 30 days supply | Qty: 30 | Fill #0 | Status: AC

## 2021-03-05 ENCOUNTER — Ambulatory Visit: Payer: 59 | Admitting: Psychiatry

## 2021-03-05 ENCOUNTER — Other Ambulatory Visit: Payer: Self-pay

## 2021-03-05 ENCOUNTER — Other Ambulatory Visit: Payer: Self-pay | Admitting: Psychiatry

## 2021-03-05 ENCOUNTER — Other Ambulatory Visit: Payer: Self-pay | Admitting: Family Medicine

## 2021-03-05 DIAGNOSIS — I82411 Acute embolism and thrombosis of right femoral vein: Secondary | ICD-10-CM

## 2021-03-05 DIAGNOSIS — F1190 Opioid use, unspecified, uncomplicated: Secondary | ICD-10-CM

## 2021-03-05 DIAGNOSIS — F5101 Primary insomnia: Secondary | ICD-10-CM

## 2021-03-05 DIAGNOSIS — F411 Generalized anxiety disorder: Secondary | ICD-10-CM

## 2021-03-05 DIAGNOSIS — M961 Postlaminectomy syndrome, not elsewhere classified: Secondary | ICD-10-CM

## 2021-03-05 MED ORDER — HYDROCODONE-ACETAMINOPHEN 10-325 MG PO TABS
ORAL_TABLET | ORAL | 0 refills | Status: DC
  Filled 2021-03-05: qty 90, 30d supply, fill #0

## 2021-03-05 MED FILL — Liraglutide (Weight Mngmt) Soln Pen-Inj 18 MG/3ML (6 MG/ML): SUBCUTANEOUS | 30 days supply | Qty: 15 | Fill #0 | Status: AC

## 2021-03-05 MED FILL — Apixaban Tab 5 MG: ORAL | 30 days supply | Qty: 60 | Fill #0 | Status: CN

## 2021-03-05 NOTE — Telephone Encounter (Signed)
Requested medication (s) are due for refill today: yes   Requested medication (s) are on the active medication list:yes   Last refill:  02/05/2021  Future visit scheduled: no  Notes to clinic:  this refill cannot be delegated    Requested Prescriptions  Pending Prescriptions Disp Refills   HYDROcodone-acetaminophen (NORCO) 10-325 MG tablet 90 tablet 0    Sig: TAKE 1 TABLET BY MOUTH EVERY EIGHT HOURS AS NEEDED      Not Delegated - Analgesics:  Opioid Agonist Combinations Failed - 03/05/2021  8:15 AM      Failed - This refill cannot be delegated      Failed - Urine Drug Screen completed in last 360 days      Failed - Valid encounter within last 6 months    Recent Outpatient Visits           8 months ago 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

## 2021-03-06 ENCOUNTER — Other Ambulatory Visit: Payer: Self-pay

## 2021-03-06 MED ORDER — ALPRAZOLAM 1 MG PO TABS
ORAL_TABLET | ORAL | 0 refills | Status: DC
Start: 2021-03-09 — End: 2021-03-07
  Filled 2021-03-06: qty 90, fill #0

## 2021-03-06 NOTE — Telephone Encounter (Signed)
Last filled 7/15 appt on 8/10

## 2021-03-07 ENCOUNTER — Telehealth (INDEPENDENT_AMBULATORY_CARE_PROVIDER_SITE_OTHER): Payer: 59 | Admitting: Psychiatry

## 2021-03-07 ENCOUNTER — Encounter: Payer: Self-pay | Admitting: Psychiatry

## 2021-03-07 ENCOUNTER — Other Ambulatory Visit: Payer: Self-pay

## 2021-03-07 DIAGNOSIS — F5101 Primary insomnia: Secondary | ICD-10-CM | POA: Diagnosis not present

## 2021-03-07 DIAGNOSIS — F411 Generalized anxiety disorder: Secondary | ICD-10-CM | POA: Diagnosis not present

## 2021-03-07 DIAGNOSIS — F4323 Adjustment disorder with mixed anxiety and depressed mood: Secondary | ICD-10-CM

## 2021-03-07 MED ORDER — ARMODAFINIL 150 MG PO TABS
ORAL_TABLET | Freq: Every morning | ORAL | 5 refills | Status: DC
  Filled 2021-03-07: qty 30, 30d supply, fill #0
  Filled 2021-04-30: qty 30, 30d supply, fill #1
  Filled 2021-05-31: qty 30, 30d supply, fill #2

## 2021-03-07 MED ORDER — ALPRAZOLAM 1 MG PO TABS
ORAL_TABLET | ORAL | 5 refills | Status: DC
  Filled 2021-03-07: qty 90, fill #0
  Filled 2021-03-09: qty 90, 30d supply, fill #0
  Filled 2021-04-09: qty 90, 30d supply, fill #1
  Filled 2021-05-11: qty 90, 30d supply, fill #2
  Filled 2021-05-31 – 2021-06-12 (×2): qty 90, 30d supply, fill #3
  Filled 2021-07-12: qty 90, 30d supply, fill #4
  Filled 2021-08-08 – 2021-08-09 (×2): qty 90, 30d supply, fill #5
  Filled ????-??-??: fill #3

## 2021-03-07 NOTE — Progress Notes (Signed)
Michaela Jones [id number] [birth year] 45 y.o.  Virtual Visit via Telephone Note  I connected with pt on 03/09/21 at  3:30 PM EDT by telephone and verified that I am speaking with the correct person using two identifiers.   I discussed the limitations, risks, security and privacy concerns of performing an evaluation and management service by telephone and the availability of in person appointments. I also discussed with the patient that there may be a patient responsible charge related to this service. The patient expressed understanding and agreed to proceed.   I discussed the assessment and treatment plan with the patient. The patient was provided an opportunity to ask questions and all were answered. The patient agreed with the plan and demonstrated an understanding of the instructions.   The patient was advised to call back or seek an in-person evaluation if the symptoms worsen or if the condition fails to improve as anticipated.  I provided 35 minutes of non-face-to-face time during this encounter.  The patient was located at home.  The provider was located at [place].   Thayer Headings, PMHNP   Subjective:   Patient ID:  Michaela Jones is a 45 y.o. (DOB 1976-01-21) female.  Chief Complaint:  Chief Complaint  Patient presents with   Follow-up    Anxiety, Depression, ADHD, and insomnia    HPI Michaela Jones presents for follow-up of anxiety, depression, ADHD, and insomnia. She received a court summons Monday regarding custody issues with nephew. Reports that nephew's mother showed up at their house at 3:30 am in May. She reports that they have been working on obtaining OT and other services for nephew's developmental delays.   She reports that she was offered a 6 month teaching contract and "it set me back" that she was not offered 12 month contract. She was also assigned to teach Latin again.   She reports that she has had some occ anxiety in response to psychosocial  stressors. "Day to day, it's good." She reports that she has not had significant depression. She reports that her energy and motivation have been good. She reports that she was sick for a week and slept a significant amount. Reports that she did not take Nuvigil as much over summer break. She reports that she re-started Nuvigil yesterday with return to work and was able to concentrate without difficulty. Describes sleep as "mostly good." Appetite has been ok. She reports that she has lost some weight and would like to lose more weight. Denies SI.   Dayvigo last filled 03/05/21, second fill from 02/06/21. Xanax last filled 02/08/21.  She reports taking Xanax prn and typically taking 2 tabs at night. She reports that she does not drive after taking Xanax prn.   Past Psychiatric Medication Trials: Cymbalta Sertraline Remeron Wellbutrin Buspar Nuvigil Provigil- increased activation Ritalin Concerta Adderall- Not as effective Strattera Klonopin Xanax Vistaril- Ineffective Ambien- Effective. Caused parasomnias. Lunesta Temazepam Trazodone-Ineffective Belsomra Doxepin Silenor Seroquel- wt gain Gabapentin- Stuttering Lyrica Trileptal- tingling in extremities  Review of Systems:  Review of Systems  Gastrointestinal:        Occ GI s/s.  Musculoskeletal:  Negative for gait problem.  Psychiatric/Behavioral:         Please refer to HPI   PCP is now Dr. Caryn Section.   Medications: I have reviewed the patient's current medications.  Current Outpatient Medications  Medication Sig Dispense Refill   apixaban (ELIQUIS) 5 MG TABS tablet TAKE 1 TABLET BY MOUTH TWICE DAILY 60 tablet 2  cetirizine (ZYRTEC) 10 MG tablet Take 10 mg by mouth daily as needed for allergies.     Cholecalciferol (VITAMIN D) 50 MCG (2000 UT) CAPS Take by mouth.     cyanocobalamin (,VITAMIN B-12,) 1000 MCG/ML injection INJECT 1 MILLILITER (ML) ONCE MONTHLY 6 mL 4   fluticasone (FLONASE) 50 MCG/ACT nasal spray Place 2  sprays into both nostrils daily. 16 g 6   HYDROcodone-acetaminophen (NORCO) 10-325 MG tablet TAKE 1 TABLET BY MOUTH EVERY EIGHT HOURS AS NEEDED 90 tablet 0   Lemborexant (DAYVIGO) 10 MG TABS TAKE 1 TABLET BY MOUTH AT BEDTIME. 30 tablet 5   levothyroxine (SYNTHROID) 125 MCG tablet Take 1 tablet (125 mcg total) by mouth daily. Please schedule an office visit before anymore refills. 90 tablet 0   albuterol (PROVENTIL HFA;VENTOLIN HFA) 108 (90 Base) MCG/ACT inhaler Inhale 2 puffs into the lungs every 6 (six) hours as needed for wheezing or shortness of breath.     albuterol (PROVENTIL) (5 MG/ML) 0.5% nebulizer solution Inhale into the lungs.     [START ON 04/03/2021] ALPRAZolam (XANAX) 1 MG tablet TAKE 1/2 TO 1 TABLET BY MOUTH THREE TIMES DAILY AS NEEDED FOR ANXIETY 90 tablet 5   Armodafinil 150 MG tablet TAKE 1 TABLET BY MOUTH EVERY MORNING 30 tablet 5   COVID-19 At Home Antigen Test (CARESTART COVID-19 HOME TEST) KIT Use as directed (Patient not taking: Reported on 01/05/2021) 4 each 0   Insulin Pen Needle (NOVOFINE PEN NEEDLE) 32G X 6 MM MISC Use daily with saxenda. (Patient not taking: Reported on 01/05/2021) 100 each 0   Insulin Pen Needle 31G X 6 MM MISC USE DAILY WITH SAXENDA. (Patient not taking: Reported on 01/05/2021) 100 each 0   Lemborexant (DAYVIGO) 10 MG TABS Take 1 tablet by mouth at bedtime. 30 tablet 0   Liraglutide -Weight Management 18 MG/3ML SOPN INJECT 0.6MG INTO THE SKIN DAILY FOR 2 WEEKS THEN INCREASE TO 1.2MG FOR 2 WEEKS (TARGET DOSE: 3MG/DAY). PT TO FOLLOW UP IN 4 WEEKS. (Patient not taking: No sig reported) 15 mL 2   meloxicam (MOBIC) 15 MG tablet TAKE 1 TABLET BY MOUTH DAILY. (Patient not taking: No sig reported) 30 tablet 0   Multiple Vitamin (MULTIVITAMIN) tablet Take 1 tablet by mouth daily.     promethazine (PHENERGAN) 25 MG tablet TAKE 1 TABLET BY MOUTH EVERY 8 HOURS AS NEEDED FOR NAUSEA FOR UP TO 10 DAYS 30 tablet 0   No current facility-administered medications for this  visit.    Medication Side Effects: None  Allergies:  Allergies  Allergen Reactions   Lyrica [Pregabalin] Other (See Comments)    Patient reports neurological problem. Speech disturbances and unable to control motor skills   Gabapentin Swelling   Nsaids Other (See Comments)    Avoids due to gastric bypass Avoids due to gastric bypass   Penicillins Rash    Past Medical History:  Diagnosis Date   Anemia    Chronic back pain    Complication of anesthesia    HARD TO WAKE UP/OXYGEN LEVEL DROPPED AFTER BACK SURGERY X1   Constipation, chronic    Depression    DVT (deep venous thrombosis) (HCC)    Edema    History of seasonal allergies    pt has inhalers for this if needed   Hypotension    Hypothyroid    Insomnia    Lymphedema of both lower extremities    Migraine    Restless leg syndrome    Vitamin D deficiency  Family History  Problem Relation Age of Onset   Pulmonary embolism Mother    Depression Mother    Asthma Mother    Heart attack Father    Cancer Father     Social History   Socioeconomic History   Marital status: Married    Spouse name: Not on file   Number of children: Not on file   Years of education: Not on file   Highest education level: Not on file  Occupational History   Not on file  Tobacco Use   Smoking status: Never   Smokeless tobacco: Never  Substance and Sexual Activity   Alcohol use: Yes    Alcohol/week: 0.0 standard drinks    Comment: Rare   Drug use: No   Sexual activity: Not on file  Other Topics Concern   Not on file  Social History Narrative   Not on file   Social Determinants of Health   Financial Resource Strain: Not on file  Food Insecurity: Not on file  Transportation Needs: Not on file  Physical Activity: Not on file  Stress: Not on file  Social Connections: Not on file  Intimate Partner Violence: Not on file    Past Medical History, Surgical history, Social history, and Family history were reviewed and  updated as appropriate.   Please see review of systems for further details on the patient's review from today.   Objective:   Physical Exam:  There were no vitals taken for this visit.  Physical Exam Constitutional:      General: She is not in acute distress. Musculoskeletal:        General: No deformity.  Neurological:     Mental Status: She is alert and oriented to person, place, and time.     Coordination: Coordination normal.  Psychiatric:        Attention and Perception: Attention and perception normal. She does not perceive auditory or visual hallucinations.        Mood and Affect: Mood normal. Mood is not anxious or depressed. Affect is not labile, blunt, angry or inappropriate.        Speech: Speech normal.        Behavior: Behavior normal.        Thought Content: Thought content normal. Thought content is not paranoid or delusional. Thought content does not include homicidal or suicidal ideation. Thought content does not include homicidal or suicidal plan.        Cognition and Memory: Cognition and memory normal.        Judgment: Judgment normal.     Comments: Insight intact    Lab Review:     Component Value Date/Time   NA 138 05/26/2020 1312   NA 134 04/07/2020 1130   NA 139 12/03/2012 1136   K 4.6 05/26/2020 1312   K 4.0 12/03/2012 1136   CL 103 05/26/2020 1312   CL 105 12/03/2012 1136   CO2 25 05/26/2020 1312   CO2 26 12/03/2012 1136   GLUCOSE 104 (H) 05/26/2020 1312   GLUCOSE 77 12/03/2012 1136   BUN 8 05/26/2020 1312   BUN 11 04/07/2020 1130   BUN 16 12/03/2012 1136   CREATININE 0.68 05/26/2020 1312   CREATININE 0.46 (L) 12/03/2012 1136   CALCIUM 8.6 (L) 05/26/2020 1312   CALCIUM 9.0 12/03/2012 1136   PROT 7.6 05/26/2020 1312   PROT 7.3 04/07/2020 1130   ALBUMIN 3.4 (L) 05/26/2020 1312   ALBUMIN 4.1 04/07/2020 1130   AST 32 05/26/2020 1312  ALT 25 05/26/2020 1312   ALKPHOS 94 05/26/2020 1312   BILITOT 0.7 05/26/2020 1312   BILITOT 0.3  04/07/2020 1130   GFRNONAA >60 05/26/2020 1312   GFRNONAA >60 12/03/2012 1136   GFRAA 102 04/07/2020 1130   GFRAA >60 12/03/2012 1136       Component Value Date/Time   WBC 8.5 01/04/2021 1433   RBC 4.14 01/04/2021 1433   HGB 11.8 (L) 01/04/2021 1433   HGB 9.6 (L) 04/07/2020 1130   HCT 37.2 01/04/2021 1433   HCT 32.4 (L) 04/07/2020 1130   PLT 280 01/04/2021 1433   PLT 405 04/07/2020 1130   MCV 89.9 01/04/2021 1433   MCV 85 04/07/2020 1130   MCV 101 (H) 06/15/2013 1042   MCH 28.5 01/04/2021 1433   MCHC 31.7 01/04/2021 1433   RDW 16.9 (H) 01/04/2021 1433   RDW 15.7 (H) 04/07/2020 1130   RDW 14.5 06/15/2013 1042   LYMPHSABS 4.1 (H) 01/04/2021 1433   LYMPHSABS 2.5 04/07/2020 1130   LYMPHSABS 3.5 06/15/2013 1042   MONOABS 0.6 01/04/2021 1433   MONOABS 0.9 06/15/2013 1042   EOSABS 0.1 01/04/2021 1433   EOSABS 0.2 04/07/2020 1130   EOSABS 0.1 06/15/2013 1042   BASOSABS 0.1 01/04/2021 1433   BASOSABS 0.1 04/07/2020 1130   BASOSABS 0.1 06/15/2013 1042    No results found for: POCLITH, LITHIUM   No results found for: PHENYTOIN, PHENOBARB, VALPROATE, CBMZ   .res Assessment: Plan:   Will continue current plan of care since target signs and symptoms are well controlled without any tolerability issues. Continue alprazolam 1 mg 1/2 to 1 tablet 3 times daily as needed for anxiety. Continue modafinil 150 mg daily for off label indication of attention deficit disorder. Continue Dayvigo 10 mg at bedtime for insomnia. Patient to follow-up in 3 months or sooner if clinically indicated. Patient advised to contact office with any questions, adverse effects, or acute worsening in signs and symptoms.  Michaela Jones was seen today for follow-up.  Diagnoses and all orders for this visit:  Generalized anxiety disorder -     ALPRAZolam (XANAX) 1 MG tablet; TAKE 1/2 TO 1 TABLET BY MOUTH THREE TIMES DAILY AS NEEDED FOR ANXIETY  Primary insomnia -     ALPRAZolam (XANAX) 1 MG tablet; TAKE 1/2 TO 1  TABLET BY MOUTH THREE TIMES DAILY AS NEEDED FOR ANXIETY  Adjustment disorder with mixed anxiety and depressed mood -     Armodafinil 150 MG tablet; TAKE 1 TABLET BY MOUTH EVERY MORNING   Please see After Visit Summary for patient specific instructions.  Future Appointments  Date Time Provider [place]  04/10/2021  1:00 PM [facility] None  04/10/2021  1:15 PM Grayland Ormond, Kathlene November, MD [facility] None  04/10/2021  1:45 PM [facility]FUSION CHAIR 3 [facility] None    No orders of the defined types were placed in this encounter.     -------------------------------

## 2021-03-09 ENCOUNTER — Other Ambulatory Visit: Payer: Self-pay

## 2021-03-12 ENCOUNTER — Other Ambulatory Visit: Payer: Self-pay

## 2021-04-02 ENCOUNTER — Other Ambulatory Visit: Payer: Self-pay | Admitting: Family Medicine

## 2021-04-02 DIAGNOSIS — I82411 Acute embolism and thrombosis of right femoral vein: Secondary | ICD-10-CM

## 2021-04-02 DIAGNOSIS — F1190 Opioid use, unspecified, uncomplicated: Secondary | ICD-10-CM

## 2021-04-02 DIAGNOSIS — M961 Postlaminectomy syndrome, not elsewhere classified: Secondary | ICD-10-CM

## 2021-04-02 MED FILL — Levothyroxine Sodium Tab 125 MCG: ORAL | 30 days supply | Qty: 30 | Fill #1 | Status: AC

## 2021-04-03 ENCOUNTER — Other Ambulatory Visit: Payer: Self-pay

## 2021-04-03 ENCOUNTER — Other Ambulatory Visit: Payer: Self-pay | Admitting: Family Medicine

## 2021-04-03 DIAGNOSIS — I82411 Acute embolism and thrombosis of right femoral vein: Secondary | ICD-10-CM

## 2021-04-03 DIAGNOSIS — M961 Postlaminectomy syndrome, not elsewhere classified: Secondary | ICD-10-CM

## 2021-04-03 DIAGNOSIS — F1190 Opioid use, unspecified, uncomplicated: Secondary | ICD-10-CM

## 2021-04-03 MED FILL — Hydrocodone-Acetaminophen Tab 10-325 MG: ORAL | 30 days supply | Qty: 90 | Fill #0 | Status: AC

## 2021-04-03 NOTE — Telephone Encounter (Signed)
Last refill: 03/05/2021 #90, with no refills Last office visit: 05/16/2020 (virtual visit) No upcomming appointment scheduled.

## 2021-04-07 ENCOUNTER — Encounter: Payer: Self-pay | Admitting: Oncology

## 2021-04-07 NOTE — Progress Notes (Deleted)
[place]  [phone] [phone]  ID: Michaela Jones OB: 12/22/1975  MR#: [id number]  IYM#:[id number]  Patient Care Team: Birdie Sons, MD as PCP - General (Family Medicine) Bary Castilla Forest Gleason, MD (General Surgery)  CHIEF COMPLAINT: Iron deficiency anemia, heterozygous for prothrombin gene mutation.  INTERVAL HISTORY: Patient returns to clinic today for repeat laboratory work, further evaluation, and consideration of additional IV iron.  She currently feels well and is asymptomatic. She has no neurologic complaints.  She denies any recent fevers or illnesses.  She has a good appetite and denies weight loss.  She has no chest pain, shortness of breath, cough, or hemoptysis.  She denies any nausea, vomiting, constipation, or diarrhea.  She has no melena or hematochezia.  She has no urinary complaints.  Patient offers no specific complaints today.  REVIEW OF SYSTEMS:   Review of Systems  Constitutional: Negative.  Negative for fever, malaise/fatigue and weight loss.  Respiratory: Negative.  Negative for cough, hemoptysis and shortness of breath.   Cardiovascular: Negative.  Negative for chest pain and leg swelling.  Gastrointestinal: Negative.  Negative for abdominal pain, blood in stool and melena.  Genitourinary: Negative.  Negative for hematuria.  Musculoskeletal:  Positive for back pain.  Skin:  Negative for rash.  Neurological: Negative.  Negative for dizziness, focal weakness, weakness and headaches.  Psychiatric/Behavioral: Negative.  The patient is not nervous/anxious.    As per HPI. Otherwise, a complete review of systems is negative.  PAST MEDICAL HISTORY: Past Medical History:  Diagnosis Date   Anemia    Chronic back pain    Complication of anesthesia    HARD TO WAKE UP/OXYGEN LEVEL DROPPED AFTER BACK SURGERY X1   Constipation, chronic    Depression    DVT (deep venous thrombosis) (HCC)    Edema    History of seasonal  allergies    pt has inhalers for this if needed   Hypotension    Hypothyroid    Insomnia    Lymphedema of both lower extremities    Migraine    Restless leg syndrome    Vitamin D deficiency     PAST SURGICAL HISTORY: Past Surgical History:  Procedure Laterality Date   ABDOMINOPLASTY  2004   ABLATION  2014   BACK SURGERY  10/2012   BREAST ENHANCEMENT SURGERY  07/2008   BREAST SURGERY  09/05/2011   removal of implants   CHOLECYSTECTOMY N/A 04/16/2016   Procedure: LAPAROSCOPIC CHOLECYSTECTOMY WITH INTRAOPERATIVE CHOLANGIOGRAM;  Surgeon: Robert Bellow, MD;  Location: [facility] ORS;  Service: General;  Laterality: N/A;   GASTRIC BYPASS  2002   Lower body  01/2008, 03/2008   lift legs and thighs    FAMILY HISTORY: Family History  Problem Relation Age of Onset   Pulmonary embolism Mother    Depression Mother    Asthma Mother    Heart attack Father    Cancer Father     ADVANCED DIRECTIVES (Y/N):  N  HEALTH MAINTENANCE: Social History   Tobacco Use   Smoking status: Never   Smokeless tobacco: Never  Substance Use Topics   Alcohol use: Yes    Alcohol/week: 0.0 standard drinks    Comment: Rare   Drug use: No     Colonoscopy:  PAP:  Bone density:  Lipid panel:  Allergies  Allergen Reactions   Lyrica [Pregabalin] Other (See Comments)    Patient reports neurological problem. Speech disturbances and unable to control motor skills   Gabapentin Swelling  Nsaids Other (See Comments)    Avoids due to gastric bypass Avoids due to gastric bypass   Penicillins Rash    Current Outpatient Medications  Medication Sig Dispense Refill   albuterol (PROVENTIL HFA;VENTOLIN HFA) 108 (90 Base) MCG/ACT inhaler Inhale 2 puffs into the lungs every 6 (six) hours as needed for wheezing or shortness of breath.     albuterol (PROVENTIL) (5 MG/ML) 0.5% nebulizer solution Inhale into the lungs.     ALPRAZolam (XANAX) 1 MG tablet TAKE 1/2 TO 1 TABLET BY MOUTH THREE TIMES DAILY AS NEEDED  FOR ANXIETY 90 tablet 5   apixaban (ELIQUIS) 5 MG TABS tablet TAKE 1 TABLET BY MOUTH TWICE DAILY 60 tablet 2   Armodafinil 150 MG tablet TAKE 1 TABLET BY MOUTH EVERY MORNING 30 tablet 5   cetirizine (ZYRTEC) 10 MG tablet Take 10 mg by mouth daily as needed for allergies.     Cholecalciferol (VITAMIN D) 50 MCG (2000 UT) CAPS Take by mouth.     COVID-19 At Home Antigen Test [facility] COVID-19 HOME TEST) KIT Use as directed (Patient not taking: Reported on 01/05/2021) 4 each 0   cyanocobalamin (,VITAMIN B-12,) 1000 MCG/ML injection INJECT 1 MILLILITER (ML) ONCE MONTHLY 6 mL 4   fluticasone (FLONASE) 50 MCG/ACT nasal spray Place 2 sprays into both nostrils daily. 16 g 6   HYDROcodone-acetaminophen (NORCO) 10-325 MG tablet TAKE 1 TABLET BY MOUTH EVERY EIGHT HOURS AS NEEDED 90 tablet 0   Insulin Pen Needle (NOVOFINE PEN NEEDLE) 32G X 6 MM MISC Use daily with saxenda. (Patient not taking: Reported on 01/05/2021) 100 each 0   Insulin Pen Needle 31G X 6 MM MISC USE DAILY WITH SAXENDA. (Patient not taking: Reported on 01/05/2021) 100 each 0   Lemborexant (DAYVIGO) 10 MG TABS Take 1 tablet by mouth at bedtime. 30 tablet 0   Lemborexant (DAYVIGO) 10 MG TABS TAKE 1 TABLET BY MOUTH AT BEDTIME. 30 tablet 5   levothyroxine (SYNTHROID) 125 MCG tablet Take 1 tablet (125 mcg total) by mouth daily. Please schedule an office visit before anymore refills. 90 tablet 0   Liraglutide -Weight Management 18 MG/3ML SOPN INJECT 0.6MG INTO THE SKIN DAILY FOR 2 WEEKS THEN INCREASE TO 1.2MG FOR 2 WEEKS (TARGET DOSE: 3MG/DAY). PT TO FOLLOW UP IN 4 WEEKS. (Patient not taking: No sig reported) 15 mL 2   meloxicam (MOBIC) 15 MG tablet TAKE 1 TABLET BY MOUTH DAILY. (Patient not taking: No sig reported) 30 tablet 0   Multiple Vitamin (MULTIVITAMIN) tablet Take 1 tablet by mouth daily.     promethazine (PHENERGAN) 25 MG tablet TAKE 1 TABLET BY MOUTH EVERY 8 HOURS AS NEEDED FOR NAUSEA FOR UP TO 10 DAYS 30 tablet 0   No current  facility-administered medications for this visit.    OBJECTIVE: There were no vitals filed for this visit.    There is no height or weight on file to calculate BMI.    ECOG FS:0 - Asymptomatic  General: Well-developed, well-nourished, no acute distress. Eyes: Pink conjunctiva, anicteric sclera. HEENT: Normocephalic, moist mucous membranes. Lungs: No audible wheezing or coughing. Heart: Regular rate and rhythm. Abdomen: Soft, nontender, no obvious distention. Musculoskeletal: No edema, cyanosis, or clubbing. Neuro: Alert, answering all questions appropriately. Cranial nerves grossly intact. Skin: No rashes or petechiae noted. Psych: Normal affect.   LAB RESULTS:  Lab Results  Component Value Date   NA 138 05/26/2020   K 4.6 05/26/2020   CL 103 05/26/2020   CO2 25 05/26/2020  GLUCOSE 104 (H) 05/26/2020   BUN 8 05/26/2020   CREATININE 0.68 05/26/2020   CALCIUM 8.6 (L) 05/26/2020   PROT 7.6 05/26/2020   ALBUMIN 3.4 (L) 05/26/2020   AST 32 05/26/2020   ALT 25 05/26/2020   ALKPHOS 94 05/26/2020   BILITOT 0.7 05/26/2020   GFRNONAA >60 05/26/2020   GFRAA 102 04/07/2020    Lab Results  Component Value Date   WBC 8.5 01/04/2021   NEUTROABS 3.6 01/04/2021   HGB 11.8 (L) 01/04/2021   HCT 37.2 01/04/2021   MCV 89.9 01/04/2021   PLT 280 01/04/2021   Lab Results  Component Value Date   IRON 35 01/04/2021   TIBC 519 (H) 01/04/2021   IRONPCTSAT 7 (L) 01/04/2021   Lab Results  Component Value Date   FERRITIN 8 (L) 01/04/2021     STUDIES: No results found.  ASSESSMENT: Iron deficiency anemia, heterozygous for prothrombin gene mutation.  PLAN:  1. Iron deficiency anemia: Likely secondary to poor absorption from patient's history of gastric bypass surgery.  Patient's hemoglobin has improved since receiving IV Feraheme, but her iron stores remain decreased and essentially unchanged.  Proceed with 510 mg IV Feraheme today and return to clinic on Friday for second  infusion.  Patient will then return to clinic the first week of June after school is out for further evaluation and continuation of treatment if needed.   2.  Heterozygous for prothrombin gene mutation: Patient had a unprovoked right leg DVT in October 2021 and has now completed 6 months of Eliquis treatment.  Previously, patient underwent gastric bypass surgery and 2 healthy pregnancies with out issue.  After lengthy discussion with the patient, she understands the risks and benefits of discontinuing anticoagulation versus lifelong treatment.  She has elected to discontinue Eliquis, but expressed understanding that if she had a second DVT under any circumstances would recommend lifelong treatment at that time.    I spent a total of 30 minutes reviewing chart data, face-to-face evaluation with the patient, counseling and coordination of care as detailed above.  Patient expressed understanding and was in agreement with this plan. She also understands that She can call clinic at any time with any questions, concerns, or complaints.    Lloyd Huger, MD   04/07/2021 8:36 AM

## 2021-04-09 ENCOUNTER — Other Ambulatory Visit: Payer: Self-pay

## 2021-04-09 DIAGNOSIS — D509 Iron deficiency anemia, unspecified: Secondary | ICD-10-CM

## 2021-04-10 ENCOUNTER — Inpatient Hospital Stay: Payer: 59 | Admitting: Oncology

## 2021-04-10 ENCOUNTER — Inpatient Hospital Stay: Payer: 59

## 2021-04-10 ENCOUNTER — Telehealth: Payer: Self-pay | Admitting: Oncology

## 2021-04-10 DIAGNOSIS — D509 Iron deficiency anemia, unspecified: Secondary | ICD-10-CM

## 2021-04-10 DIAGNOSIS — D6852 Prothrombin gene mutation: Secondary | ICD-10-CM

## 2021-04-10 NOTE — Telephone Encounter (Signed)
Patient left voicemail 9/13 that she is a [employer], broadcasting/film/video and cannot come for her visit until November.  I returned patients call to get specific dates for when she is available.

## 2021-04-30 ENCOUNTER — Other Ambulatory Visit: Payer: Self-pay | Admitting: Family Medicine

## 2021-04-30 DIAGNOSIS — F1190 Opioid use, unspecified, uncomplicated: Secondary | ICD-10-CM

## 2021-04-30 DIAGNOSIS — M961 Postlaminectomy syndrome, not elsewhere classified: Secondary | ICD-10-CM

## 2021-04-30 DIAGNOSIS — I82411 Acute embolism and thrombosis of right femoral vein: Secondary | ICD-10-CM

## 2021-04-30 MED FILL — Apixaban Tab 5 MG: ORAL | 30 days supply | Qty: 60 | Fill #0 | Status: AC

## 2021-04-30 MED FILL — Levothyroxine Sodium Tab 125 MCG: ORAL | 30 days supply | Qty: 30 | Fill #2 | Status: AC

## 2021-05-01 ENCOUNTER — Other Ambulatory Visit: Payer: Self-pay

## 2021-05-01 MED ORDER — HYDROCODONE-ACETAMINOPHEN 10-325 MG PO TABS
1.0000 | ORAL_TABLET | Freq: Three times a day (TID) | ORAL | 0 refills | Status: DC | PRN
Start: 2021-05-01 — End: 2021-05-30
  Filled 2021-05-01: qty 90, 30d supply, fill #0

## 2021-05-01 MED ORDER — PROMETHAZINE HCL 25 MG PO TABS
25.0000 mg | ORAL_TABLET | Freq: Three times a day (TID) | ORAL | 2 refills | Status: DC | PRN
  Filled 2021-05-01: qty 30, 10d supply, fill #0
  Filled 2021-10-08: qty 30, 10d supply, fill #1
  Filled 2022-01-23: qty 30, 10d supply, fill #2

## 2021-05-03 ENCOUNTER — Other Ambulatory Visit: Payer: Self-pay

## 2021-05-04 ENCOUNTER — Other Ambulatory Visit: Payer: Self-pay

## 2021-05-08 ENCOUNTER — Other Ambulatory Visit: Payer: Self-pay

## 2021-05-08 ENCOUNTER — Telehealth: Payer: 59 | Admitting: Emergency Medicine

## 2021-05-08 DIAGNOSIS — H1033 Unspecified acute conjunctivitis, bilateral: Secondary | ICD-10-CM | POA: Diagnosis not present

## 2021-05-08 MED ORDER — CIPROFLOXACIN HCL 0.3 % OP SOLN
1.0000 [drp] | OPHTHALMIC | 0 refills | Status: DC
  Filled 2021-05-08: qty 5, 7d supply, fill #0

## 2021-05-08 NOTE — Progress Notes (Signed)
Virtual Visit Consent   Michaela Jones, you are scheduled for a virtual visit with a [facility] provider today.     Just as with appointments in the office, your consent must be obtained to participate.  Your consent will be active for this visit and any virtual visit you may have with one of our providers in the next 365 days.     If you have a MyChart account, a copy of this consent can be sent to you electronically.  All virtual visits are billed to your insurance company just like a traditional visit in the office.    As this is a virtual visit, video technology does not allow for your provider to perform a traditional examination.  This may limit your provider's ability to fully assess your condition.  If your provider identifies any concerns that need to be evaluated in person or the need to arrange testing (such as labs, EKG, etc.), we will make arrangements to do so.     Although advances in technology are sophisticated, we cannot ensure that it will always work on either your end or our end.  If the connection with a video visit is poor, the visit may have to be switched to a telephone visit.  With either a video or telephone visit, we are not always able to ensure that we have a secure connection.     I need to obtain your verbal consent now.   Are you willing to proceed with your visit today?    LEXANDRA RETTKE has provided verbal consent on 05/08/2021 for a virtual visit video.   Montine Circle, PA-C   Date: 05/08/2021 7:28 AM   Virtual Visit via Video Note   I, Montine Circle, connected with  Michaela Jones  ([id number], 03/09/1976) on 05/08/21 at  7:30 AM EDT by a video-enabled telemedicine application and verified that I am speaking with the correct person using two identifiers.  Location: Patient: Virtual Visit Location Patient: Home Provider: Virtual Visit Location Provider: Home Office   I discussed the limitations of evaluation and management by telemedicine and the  availability of in person appointments. The patient expressed understanding and agreed to proceed.    History of Present Illness: Michaela Jones is a 45 y.o. who identifies as a female who was assigned female at birth, and is being seen today for eye swelling.  States that her eyes have been irritated.  States that she had some eye discharge.  She wears contacts and normally wears her contacts for about week 24/7 before changing them.  She denies any trauma to the eye.   HPI: HPI  Problems:  Patient Active Problem List   Diagnosis Date Noted   DVT of deep femoral vein ([place]) 05/26/2020   Chronic pain disorder 05/16/2020   Chronic, continuous use of opioids 05/16/2020   Anxiety 06/17/2018   Lumbar postlaminectomy syndrome 10/23/2017   Lumbosacral radiculopathy 10/23/2017   Vitamin D deficiency 03/01/2015   Acquired spondylolisthesis 03/01/2015   Elevated LDL cholesterol level 03/01/2015   Chronic constipation 03/01/2015   Dysthymia 11/11/2013   Absolute anemia 10/15/2012   Gastric bypass status for obesity 10/15/2012   Insomnia 09/03/2007   Allergic rhinitis 06/15/2007   Iron deficiency anemia 01/22/2007   Restless leg 09/23/2006   Headache, migraine 08/27/2006   Acquired lymphedema 07/29/1998   Adult hypothyroidism 07/29/1992    Allergies:  Allergies  Allergen Reactions   Lyrica [Pregabalin] Other (See Comments)    Patient reports  neurological problem. Speech disturbances and unable to control motor skills   Gabapentin Swelling   Nsaids Other (See Comments)    Avoids due to gastric bypass Avoids due to gastric bypass   Penicillins Rash   Medications:  Current Outpatient Medications:    albuterol (PROVENTIL HFA;VENTOLIN HFA) 108 (90 Base) MCG/ACT inhaler, Inhale 2 puffs into the lungs every 6 (six) hours as needed for wheezing or shortness of breath., Disp: , Rfl:    albuterol (PROVENTIL) (5 MG/ML) 0.5% nebulizer solution, Inhale into the lungs., Disp: , Rfl:    ALPRAZolam  (XANAX) 1 MG tablet, TAKE 1/2 TO 1 TABLET BY MOUTH THREE TIMES DAILY AS NEEDED FOR ANXIETY, Disp: 90 tablet, Rfl: 5   apixaban (ELIQUIS) 5 MG TABS tablet, TAKE 1 TABLET BY MOUTH TWICE DAILY, Disp: 60 tablet, Rfl: 2   Armodafinil 150 MG tablet, TAKE 1 TABLET BY MOUTH EVERY MORNING, Disp: 30 tablet, Rfl: 5   cetirizine (ZYRTEC) 10 MG tablet, Take 10 mg by mouth daily as needed for allergies., Disp: , Rfl:    Cholecalciferol (VITAMIN D) 50 MCG (2000 UT) CAPS, Take by mouth., Disp: , Rfl:    COVID-19 At Home Antigen Test (CARESTART COVID-19 HOME TEST) KIT, Use as directed (Patient not taking: Reported on 01/05/2021), Disp: 4 each, Rfl: 0   cyanocobalamin (,VITAMIN B-12,) 1000 MCG/ML injection, INJECT 1 MILLILITER (ML) ONCE MONTHLY, Disp: 6 mL, Rfl: 4   fluticasone (FLONASE) 50 MCG/ACT nasal spray, Place 2 sprays into both nostrils daily., Disp: 16 g, Rfl: 6   HYDROcodone-acetaminophen (NORCO) 10-325 MG tablet, Take 1 tablet by mouth every 8 (eight) hours as needed for severe pain., Disp: 90 tablet, Rfl: 0   Insulin Pen Needle (NOVOFINE PEN NEEDLE) 32G X 6 MM MISC, Use daily with saxenda. (Patient not taking: Reported on 01/05/2021), Disp: 100 each, Rfl: 0   Lemborexant (DAYVIGO) 10 MG TABS, Take 1 tablet by mouth at bedtime., Disp: 30 tablet, Rfl: 0   Lemborexant (DAYVIGO) 10 MG TABS, TAKE 1 TABLET BY MOUTH AT BEDTIME., Disp: 30 tablet, Rfl: 5   levothyroxine (SYNTHROID) 125 MCG tablet, Take 1 tablet (125 mcg total) by mouth daily. Please schedule an office visit before anymore refills., Disp: 90 tablet, Rfl: 0   Multiple Vitamin (MULTIVITAMIN) tablet, Take 1 tablet by mouth daily., Disp: , Rfl:    promethazine (PHENERGAN) 25 MG tablet, Take 1 tablet (25 mg total) by mouth every 8 (eight) hours as needed for nausea or vomiting., Disp: 30 tablet, Rfl: 2  Observations/Objective: Patient is well-developed, well-nourished in no acute distress.  Resting comfortably at home.  Head is normocephalic,  atraumatic.  No labored breathing.  Speech is clear and coherent with logical content.  Patient is alert and oriented at baseline.  Mild swelling of the eyelids, conjunctival erythema  Assessment and Plan: 1. Acute conjunctivitis of both eyes, unspecified acute conjunctivitis type Cipro Ophthalmic Drops  Follow Up Instructions: I discussed the assessment and treatment plan with the patient. The patient was provided an opportunity to ask questions and all were answered. The patient agreed with the plan and demonstrated an understanding of the instructions.  A copy of instructions were sent to the patient via MyChart unless otherwise noted below.     The patient was advised to call back or seek an in-person evaluation if the symptoms worsen or if the condition fails to improve as anticipated.  Time:  I spent 7 minutes with the patient via telehealth technology discussing the above problems/concerns.  Montine Circle, PA-C

## 2021-05-08 NOTE — Patient Instructions (Signed)
Keep your contacts out for the next 1 week.  Instill 1 drop of antibiotic in each eye every 2 waking hours for the next 5-7 days.  If not better after 48 hours, see your eye doctor.

## 2021-05-10 ENCOUNTER — Other Ambulatory Visit: Payer: Self-pay

## 2021-05-10 DIAGNOSIS — H0015 Chalazion left lower eyelid: Secondary | ICD-10-CM | POA: Diagnosis not present

## 2021-05-10 DIAGNOSIS — H0011 Chalazion right upper eyelid: Secondary | ICD-10-CM | POA: Diagnosis not present

## 2021-05-10 MED ORDER — DOXYCYCLINE HYCLATE 100 MG PO CAPS
100.0000 mg | ORAL_CAPSULE | Freq: Two times a day (BID) | ORAL | 0 refills | Status: DC
  Filled 2021-05-10: qty 28, 14d supply, fill #0

## 2021-05-10 MED ORDER — NEOMYCIN-POLYMYXIN-DEXAMETH 3.5-10000-0.1 OP SUSP
OPHTHALMIC | 0 refills | Status: DC
  Filled 2021-05-10: qty 5, 14d supply, fill #0

## 2021-05-11 ENCOUNTER — Other Ambulatory Visit: Payer: Self-pay

## 2021-05-15 DIAGNOSIS — H0011 Chalazion right upper eyelid: Secondary | ICD-10-CM | POA: Diagnosis not present

## 2021-05-30 ENCOUNTER — Other Ambulatory Visit: Payer: Self-pay | Admitting: Family Medicine

## 2021-05-30 ENCOUNTER — Other Ambulatory Visit: Payer: Self-pay

## 2021-05-30 DIAGNOSIS — M961 Postlaminectomy syndrome, not elsewhere classified: Secondary | ICD-10-CM

## 2021-05-30 DIAGNOSIS — I82411 Acute embolism and thrombosis of right femoral vein: Secondary | ICD-10-CM

## 2021-05-30 DIAGNOSIS — F1190 Opioid use, unspecified, uncomplicated: Secondary | ICD-10-CM

## 2021-05-30 MED ORDER — HYDROCODONE-ACETAMINOPHEN 10-325 MG PO TABS
1.0000 | ORAL_TABLET | Freq: Three times a day (TID) | ORAL | 0 refills | Status: DC | PRN
  Filled 2021-05-30 – 2021-06-01 (×3): qty 90, 30d supply, fill #0
  Filled ????-??-??: fill #0

## 2021-05-30 NOTE — Telephone Encounter (Signed)
Requested medication (s) are due for refill today - yes  Requested medication (s) are on the active medication list -yes  Future visit scheduled -no  Last refill: 05/01/21 #90  Notes to clinic: Request RF: non delegated Rx  Requested Prescriptions  Pending Prescriptions Disp Refills   HYDROcodone-acetaminophen (NORCO) 10-325 MG tablet 90 tablet 0    Sig: Take 1 tablet by mouth every 8 (eight) hours as needed for severe pain.     Not Delegated - Analgesics:  Opioid Agonist Combinations Failed - 05/30/2021  8:29 AM      Failed - This refill cannot be delegated      Failed - Urine Drug Screen completed in last 360 days      Failed - Valid encounter within last 6 months    Recent Outpatient Visits           11 months ago Deep venous thrombosis of right profunda femoris vein (HCC)   [facility] Malva Limes, MD   1 year ago Chronic low back pain, unspecified back pain laterality, unspecified whether sciatica present   [facility] Malva Limes, MD   1 year ago Chronic low back pain, unspecified back pain laterality, unspecified whether sciatica present   [facility] Trey Sailors, PA-C   1 year ago Annual physical exam   [facility] Osvaldo Angst M, [place]   1 year ago Cystitis   [facility] Osvaldo Angst M, [place]                 Requested Prescriptions  Pending Prescriptions Disp Refills   HYDROcodone-acetaminophen (NORCO) 10-325 MG tablet 90 tablet 0    Sig: Take 1 tablet by mouth every 8 (eight) hours as needed for severe pain.     Not Delegated - Analgesics:  Opioid Agonist Combinations Failed - 05/30/2021  8:29 AM      Failed - This refill cannot be delegated      Failed - Urine Drug Screen completed in last 360 days      Failed - Valid encounter within last 6 months    Recent Outpatient Visits           11 months ago Deep venous thrombosis of right profunda femoris vein  (HCC)   [facility] Malva Limes, MD   1 year ago Chronic low back pain, unspecified back pain laterality, unspecified whether sciatica present   [facility] Malva Limes, MD   1 year ago Chronic low back pain, unspecified back pain laterality, unspecified whether sciatica present   [facility] Trey Sailors, PA-C   1 year ago Annual physical exam   [facility] Trey Sailors, [place]   1 year ago Cystitis   [facility] Osvaldo Angst [place], [place]

## 2021-05-31 ENCOUNTER — Other Ambulatory Visit: Payer: Self-pay

## 2021-06-01 ENCOUNTER — Other Ambulatory Visit: Payer: Self-pay

## 2021-06-04 ENCOUNTER — Other Ambulatory Visit: Payer: Self-pay

## 2021-06-06 ENCOUNTER — Other Ambulatory Visit: Payer: Self-pay

## 2021-06-06 MED ORDER — DOXYCYCLINE HYCLATE 100 MG PO CAPS
100.0000 mg | ORAL_CAPSULE | Freq: Two times a day (BID) | ORAL | 0 refills | Status: DC
  Filled 2021-06-06: qty 28, 14d supply, fill #0

## 2021-06-06 MED ORDER — NEOMYCIN-POLYMYXIN-DEXAMETH 3.5-10000-0.1 OP SUSP
OPHTHALMIC | 0 refills | Status: DC
  Filled 2021-06-06: qty 5, 9d supply, fill #0

## 2021-06-06 MED ORDER — NEOMYCIN-POLYMYXIN-DEXAMETH 3.5-10000-0.1 OP SUSP
OPHTHALMIC | 0 refills | Status: DC
  Filled 2021-06-06: qty 5, 14d supply, fill #0

## 2021-06-07 DIAGNOSIS — H11222 Conjunctival granuloma, left eye: Secondary | ICD-10-CM | POA: Diagnosis not present

## 2021-06-12 ENCOUNTER — Other Ambulatory Visit: Payer: Self-pay

## 2021-06-18 NOTE — Progress Notes (Signed)
Michaela Jones  [phone] [phone]) [phone]  ID: Michaela Jones OB: May 21, 1976  MR#: [id number]  PHX#:[id number]  Patient Care Team: Birdie Sons, MD as PCP - General (Family Medicine) Bary Castilla Forest Gleason, MD (General Surgery)  CHIEF COMPLAINT: Iron deficiency anemia, heterozygous for prothrombin gene mutation.  INTERVAL HISTORY: Patient returns to clinic today for repeat laboratory work, further evaluation, consideration of additional IV Venofer.  She has noticed intermittent leg cramping over the past several months and will take Eliquis as needed during these episodes.  She has increased anxiety today, but otherwise feels well.  She has no neurologic complaints.  She denies any recent fevers or illnesses.  She has a good appetite and denies weight loss.  She has no chest pain, shortness of breath, cough, or hemoptysis.  She denies any nausea, vomiting, constipation, or diarrhea.  She has no melena or hematochezia.  She has no urinary complaints.  Patient offers no further specific complaints today.  REVIEW OF SYSTEMS:   Review of Systems  Constitutional: Negative.  Negative for fever, malaise/fatigue and weight loss.  Respiratory: Negative.  Negative for cough, hemoptysis and shortness of breath.   Cardiovascular: Negative.  Negative for chest pain and leg swelling.  Gastrointestinal: Negative.  Negative for abdominal pain, blood in stool and melena.  Genitourinary: Negative.  Negative for hematuria.  Musculoskeletal: Negative.  Negative for back pain.  Skin:  Negative for rash.  Neurological: Negative.  Negative for dizziness, focal weakness, weakness and headaches.  Psychiatric/Behavioral:  The patient is nervous/anxious.    As per HPI. Otherwise, a complete review of systems is negative.  PAST MEDICAL HISTORY: Past Medical History:  Diagnosis Date   Anemia    Chronic back pain    Complication of anesthesia    HARD TO WAKE UP/OXYGEN LEVEL DROPPED  AFTER BACK SURGERY X1   Constipation, chronic    Depression    DVT (deep venous thrombosis) (HCC)    Edema    History of seasonal allergies    pt has inhalers for this if needed   Hypotension    Hypothyroid    Insomnia    Lymphedema of both lower extremities    Migraine    Restless leg syndrome    Vitamin D deficiency     PAST SURGICAL HISTORY: Past Surgical History:  Procedure Laterality Date   ABDOMINOPLASTY  2004   ABLATION  2014   BACK SURGERY  10/2012   BREAST ENHANCEMENT SURGERY  07/2008   BREAST SURGERY  09/05/2011   removal of implants   CHOLECYSTECTOMY N/A 04/16/2016   Procedure: LAPAROSCOPIC CHOLECYSTECTOMY WITH INTRAOPERATIVE CHOLANGIOGRAM;  Surgeon: Robert Bellow, MD;  Location: [facility] ORS;  Service: General;  Laterality: N/A;   GASTRIC BYPASS  2002   Lower body  01/2008, 03/2008   lift legs and thighs    FAMILY HISTORY: Family History  Problem Relation Age of Onset   Pulmonary embolism Mother    Depression Mother    Asthma Mother    Heart attack Father    Cancer Father     ADVANCED DIRECTIVES (Y/N):  N  HEALTH MAINTENANCE: Social History   Tobacco Use   Smoking status: Never   Smokeless tobacco: Never  Substance Use Topics   Alcohol use: Yes    Alcohol/week: 0.0 standard drinks    Comment: Rare   Drug use: No     Colonoscopy:  PAP:  Bone density:  Lipid panel:  Allergies  Allergen Reactions   Lyrica [  Pregabalin] Other (See Comments)    Patient reports neurological problem. Speech disturbances and unable to control motor skills   Gabapentin Swelling   Nsaids Other (See Comments)    Avoids due to gastric bypass Avoids due to gastric bypass   Penicillins Rash    Current Outpatient Medications  Medication Sig Dispense Refill   albuterol (PROVENTIL HFA;VENTOLIN HFA) 108 (90 Base) MCG/ACT inhaler Inhale 2 puffs into the lungs every 6 (six) hours as needed for wheezing or shortness of breath.     albuterol (PROVENTIL) (5 MG/ML) 0.5%  nebulizer solution Inhale into the lungs.     ALPRAZolam (XANAX) 1 MG tablet TAKE 1/2 TO 1 TABLET BY MOUTH THREE TIMES DAILY AS NEEDED FOR ANXIETY 90 tablet 5   apixaban (ELIQUIS) 5 MG TABS tablet TAKE 1 TABLET BY MOUTH TWICE DAILY 60 tablet 2   Armodafinil 150 MG tablet TAKE 1 TABLET BY MOUTH EVERY MORNING 30 tablet 5   cetirizine (ZYRTEC) 10 MG tablet Take 10 mg by mouth daily as needed for allergies.     Cholecalciferol (VITAMIN D) 50 MCG (2000 UT) CAPS Take by mouth.     cyanocobalamin (,VITAMIN B-12,) 1000 MCG/ML injection INJECT 1 MILLILITER (ML) ONCE MONTHLY 6 mL 4   fluticasone (FLONASE) 50 MCG/ACT nasal spray Place 2 sprays into both nostrils daily. 16 g 6   HYDROcodone-acetaminophen (NORCO) 10-325 MG tablet Take 1 tablet by mouth every 8 (eight) hours as needed for severe pain. 90 tablet 0   Lemborexant (DAYVIGO) 10 MG TABS Take 1 tablet by mouth at bedtime. 30 tablet 0   Lemborexant (DAYVIGO) 10 MG TABS TAKE 1 TABLET BY MOUTH AT BEDTIME. 30 tablet 5   levothyroxine (SYNTHROID) 125 MCG tablet Take 1 tablet (125 mcg total) by mouth daily. Please schedule an office visit before anymore refills. 90 tablet 0   Multiple Vitamin (MULTIVITAMIN) tablet Take 1 tablet by mouth daily.     ciprofloxacin (CILOXAN) 0.3 % ophthalmic solution Place 1 drop into both eyes every 2 (two) hours while awake. Administer 1 drop, every 2 hours, while awake, for 2 days. Then 1 drop, every 4 hours, while awake, for the next 5 days. (Patient not taking: Reported on 06/19/2021) 5 mL 0   COVID-19 At Home Antigen Test (CARESTART COVID-19 HOME TEST) KIT Use as directed (Patient not taking: Reported on 06/19/2021) 4 each 0   doxycycline (VIBRAMYCIN) 100 MG capsule Take 1 capsule by mouth twice a day (Patient not taking: Reported on 06/19/2021) 28 capsule 0   Insulin Pen Needle (NOVOFINE PEN NEEDLE) 32G X 6 MM MISC Use daily with saxenda. (Patient not taking: Reported on 01/05/2021) 100 each 0   neomycin-polymyxin  b-dexamethasone (MAXITROL) 3.5-10000-0.1 SUSP Apply 1 drop into both eyes four times a day (Patient not taking: Reported on 06/19/2021) 5 mL 0   neomycin-polymyxin b-dexamethasone (MAXITROL) 3.5-10000-0.1 SUSP Instill 1 drop into left eye four times a day (Patient not taking: Reported on 06/19/2021) 5 mL 0   promethazine (PHENERGAN) 25 MG tablet Take 1 tablet (25 mg total) by mouth every 8 (eight) hours as needed for nausea or vomiting. (Patient not taking: Reported on 06/19/2021) 30 tablet 2   No current facility-administered medications for this visit.    OBJECTIVE: Vitals:   06/19/21 1426  BP: 112/84  Pulse: 85  Resp: 16  Temp: 97.9 F (36.6 C)  SpO2: 99%     Body mass index is 39.08 kg/m.    ECOG FS:0 - Asymptomatic  General: Well-developed,  well-nourished, no acute distress. Eyes: Pink conjunctiva, anicteric sclera. HEENT: Normocephalic, moist mucous membranes. Lungs: No audible wheezing or coughing. Heart: Regular rate and rhythm. Abdomen: Soft, nontender, no obvious distention. Musculoskeletal: No edema, cyanosis, or clubbing. Neuro: Alert, answering all questions appropriately. Cranial nerves grossly intact. Skin: No rashes or petechiae noted. Psych: Normal affect.   LAB RESULTS:  Lab Results  Component Value Date   NA 138 05/26/2020   K 4.6 05/26/2020   CL 103 05/26/2020   CO2 25 05/26/2020   GLUCOSE 104 (H) 05/26/2020   BUN 8 05/26/2020   CREATININE 0.68 05/26/2020   CALCIUM 8.6 (L) 05/26/2020   PROT 7.6 05/26/2020   ALBUMIN 3.4 (L) 05/26/2020   AST 32 05/26/2020   ALT 25 05/26/2020   ALKPHOS 94 05/26/2020   BILITOT 0.7 05/26/2020   GFRNONAA >60 05/26/2020   GFRAA 102 04/07/2020    Lab Results  Component Value Date   WBC 8.9 06/19/2021   NEUTROABS 5.4 06/19/2021   HGB 13.6 06/19/2021   HCT 42.3 06/19/2021   MCV 102.9 (H) 06/19/2021   PLT 255 06/19/2021   Lab Results  Component Value Date   IRON 35 01/04/2021   TIBC 519 (H) 01/04/2021    IRONPCTSAT 7 (L) 01/04/2021   Lab Results  Component Value Date   FERRITIN 8 (L) 01/04/2021     STUDIES: No results found.  ASSESSMENT: Iron deficiency anemia, heterozygous for prothrombin gene mutation.  PLAN:  1. Iron deficiency anemia: Likely secondary to poor absorption from patient's history of gastric bypass surgery.  Patient's hemoglobin is now within normal limits at 13.6.  Iron stores are pending at time of dictation.  She does not require additional IV Venofer at this time.  Patient last received treatment on January 19, 2021.  Return to clinic in 3 months with repeat laboratory work, further evaluation, and additional treatment if needed.   2.  Heterozygous for prothrombin gene mutation: Patient had a unprovoked right leg DVT in October 2021 and completed 6 months of Eliquis treatment.  Previously, patient underwent gastric bypass surgery and 2 healthy pregnancies with out issue.  After lengthy discussion with the patient, she understands the risks and benefits of discontinuing anticoagulation versus lifelong treatment.  She has elected to discontinue Eliquis, but expressed understanding that if she had a second DVT under any circumstances would recommend lifelong treatment at that time.   3.  Leg cramping: Will get ultrasound of lower extremities to ensure there is no DVT.   Patient expressed understanding and was in agreement with this plan. She also understands that She can call clinic at any time with any questions, concerns, or complaints.    Lloyd Huger, MD   06/19/2021 3:10 PM

## 2021-06-19 ENCOUNTER — Inpatient Hospital Stay (HOSPITAL_BASED_OUTPATIENT_CLINIC_OR_DEPARTMENT_OTHER): Payer: 59 | Admitting: Oncology

## 2021-06-19 ENCOUNTER — Other Ambulatory Visit: Payer: Self-pay

## 2021-06-19 ENCOUNTER — Ambulatory Visit: Admission: RE | Admit: 2021-06-19 | Payer: 59 | Source: Ambulatory Visit

## 2021-06-19 ENCOUNTER — Inpatient Hospital Stay: Payer: 59 | Attending: Oncology

## 2021-06-19 ENCOUNTER — Inpatient Hospital Stay: Payer: 59

## 2021-06-19 ENCOUNTER — Other Ambulatory Visit: Payer: Self-pay | Admitting: Oncology

## 2021-06-19 VITALS — BP 112/84 | HR 85 | Temp 97.9°F | Resp 16 | Wt 242.1 lb

## 2021-06-19 DIAGNOSIS — D6852 Prothrombin gene mutation: Secondary | ICD-10-CM | POA: Insufficient documentation

## 2021-06-19 DIAGNOSIS — Z7901 Long term (current) use of anticoagulants: Secondary | ICD-10-CM | POA: Insufficient documentation

## 2021-06-19 DIAGNOSIS — D509 Iron deficiency anemia, unspecified: Secondary | ICD-10-CM

## 2021-06-19 DIAGNOSIS — R252 Cramp and spasm: Secondary | ICD-10-CM

## 2021-06-19 DIAGNOSIS — Z86718 Personal history of other venous thrombosis and embolism: Secondary | ICD-10-CM

## 2021-06-19 LAB — FERRITIN: Ferritin: 66 ng/mL (ref 11–307)

## 2021-06-19 LAB — IRON AND TIBC
Iron: 90 ug/dL (ref 28–170)
Saturation Ratios: 25 % (ref 10.4–31.8)
TIBC: 357 ug/dL (ref 250–450)
UIBC: 267 ug/dL

## 2021-06-19 LAB — CBC WITH DIFFERENTIAL/PLATELET
Abs Immature Granulocytes: 0.05 10*3/uL (ref 0.00–0.07)
Basophils Absolute: 0 10*3/uL (ref 0.0–0.1)
Basophils Relative: 0 %
Eosinophils Absolute: 0.1 10*3/uL (ref 0.0–0.5)
Eosinophils Relative: 1 %
HCT: 42.3 % (ref 36.0–46.0)
Hemoglobin: 13.6 g/dL (ref 12.0–15.0)
Immature Granulocytes: 1 %
Lymphocytes Relative: 31 %
Lymphs Abs: 2.8 10*3/uL (ref 0.7–4.0)
MCH: 33.1 pg (ref 26.0–34.0)
MCHC: 32.2 g/dL (ref 30.0–36.0)
MCV: 102.9 fL — ABNORMAL HIGH (ref 80.0–100.0)
Monocytes Absolute: 0.6 10*3/uL (ref 0.1–1.0)
Monocytes Relative: 6 %
Neutro Abs: 5.4 10*3/uL (ref 1.7–7.7)
Neutrophils Relative %: 61 %
Platelets: 255 10*3/uL (ref 150–400)
RBC: 4.11 MIL/uL (ref 3.87–5.11)
RDW: 11.7 % (ref 11.5–15.5)
WBC: 8.9 10*3/uL (ref 4.0–10.5)
nRBC: 0 % (ref 0.0–0.2)

## 2021-06-19 NOTE — Progress Notes (Signed)
Pt has cramping sensations in legs similar to the sensation prior to blood clots. Pt reports intermittently taking blood thinner medication. Pt reports being more active to help prevent clots from forming. Pt concerned about kids having same clotting issues as she does.

## 2021-07-01 ENCOUNTER — Other Ambulatory Visit: Payer: Self-pay | Admitting: Family Medicine

## 2021-07-01 DIAGNOSIS — F1190 Opioid use, unspecified, uncomplicated: Secondary | ICD-10-CM

## 2021-07-01 DIAGNOSIS — I82411 Acute embolism and thrombosis of right femoral vein: Secondary | ICD-10-CM

## 2021-07-01 DIAGNOSIS — M961 Postlaminectomy syndrome, not elsewhere classified: Secondary | ICD-10-CM

## 2021-07-02 ENCOUNTER — Other Ambulatory Visit: Payer: Self-pay

## 2021-07-02 ENCOUNTER — Telehealth: Payer: 59 | Admitting: Physician Assistant

## 2021-07-02 DIAGNOSIS — R6889 Other general symptoms and signs: Secondary | ICD-10-CM

## 2021-07-02 MED ORDER — ALBUTEROL SULFATE HFA 108 (90 BASE) MCG/ACT IN AERS
2.0000 | INHALATION_SPRAY | Freq: Four times a day (QID) | RESPIRATORY_TRACT | 0 refills | Status: DC | PRN
  Filled 2021-07-02: qty 8.5, 30d supply, fill #0

## 2021-07-02 MED ORDER — HYDROCODONE-ACETAMINOPHEN 10-325 MG PO TABS
1.0000 | ORAL_TABLET | Freq: Three times a day (TID) | ORAL | 0 refills | Status: DC | PRN
  Filled 2021-07-02: qty 90, 30d supply, fill #0

## 2021-07-02 MED ORDER — BENZONATATE 100 MG PO CAPS
100.0000 mg | ORAL_CAPSULE | Freq: Three times a day (TID) | ORAL | 0 refills | Status: DC | PRN
  Filled 2021-07-02: qty 30, 10d supply, fill #0

## 2021-07-02 NOTE — Progress Notes (Signed)
E visit for Flu like symptoms   We are sorry that you are not feeling well.  Here is how we plan to help! Based on what you have shared with me it looks like you may have a respiratory virus that may be influenza.  Influenza or "the flu" is   an infection caused by a respiratory virus. The flu virus is highly contagious and persons who did not receive their yearly flu vaccination may "catch" the flu from close contact.  We have anti-viral medications to treat the viruses that cause this infection. They are not a "cure" and only shorten the course of the infection. These prescriptions are most effective when they are given within the first 2 days of "flu" symptoms. Antiviral medication are indicated if you have a high risk of complications from the flu. You should  also consider an antiviral medication if you are in close contact with someone who is at risk. These medications can help patients avoid complications from the flu  but have side effects that you should know. Possible side effects from Tamiflu or oseltamivir include nausea, vomiting, diarrhea, dizziness, headaches, eye redness, sleep problems or other respiratory symptoms. You should not take Tamiflu if you have an allergy to oseltamivir or any to the ingredients in Tamiflu.  Based upon your symptoms and potential risk factors I recommend that you follow the flu symptoms recommendation that I have listed below.  I have prescribed tessalon perles for possible cough and albuterol inhaler for chest tightness.  ANYONE WHO HAS FLU SYMPTOMS SHOULD: Stay home. The flu is highly contagious and going out or to work exposes others! Be sure to drink plenty of fluids. Water is fine as well as fruit juices, sodas and electrolyte beverages. You may want to stay away from caffeine or alcohol. If you are nauseated, try taking small sips of liquids. How do you know if you are getting enough fluid? Your urine should be a pale yellow or almost colorless. Get  rest. Taking a steamy shower or using a humidifier may help nasal congestion and ease sore throat pain. Using a saline nasal spray works much the same way. Cough drops, hard candies and sore throat lozenges may ease your cough. Line up a caregiver. Have someone check on you regularly.   GET HELP RIGHT AWAY IF: You cannot keep down liquids or your medications. You become short of breath Your fell like you are going to pass out or loose consciousness. Your symptoms persist after you have completed your treatment plan MAKE SURE YOU  Understand these instructions. Will watch your condition. Will get help right away if you are not doing well or get worse.  Your e-visit answers were reviewed by a board certified advanced clinical practitioner to complete your personal care plan.  Depending on the condition, your plan could have included both over the counter or prescription medications.  If there is a problem please reply  once you have received a response from your provider.  Your safety is important to [place].  If you have drug allergies check your prescription carefully.    You can use MyChart to ask questions about today's visit, request a non-urgent call back, or ask for a work or school excuse for 24 hours related to this e-Visit. If it has been greater than 24 hours you will need to follow up with your provider, or enter a new e-Visit to address those concerns.  You will get an e-mail in the next two days  asking about your experience.  I hope that your e-visit has been valuable and will speed your recovery. Thank you for using e-visits.   I provided 6 minutes of non face-to-face time during this encounter for chart review and documentation.

## 2021-07-03 ENCOUNTER — Other Ambulatory Visit: Payer: Self-pay

## 2021-07-06 ENCOUNTER — Telehealth: Payer: Self-pay

## 2021-07-06 ENCOUNTER — Telehealth: Payer: 59 | Admitting: Family Medicine

## 2021-07-06 ENCOUNTER — Other Ambulatory Visit: Payer: Self-pay

## 2021-07-06 DIAGNOSIS — R051 Acute cough: Secondary | ICD-10-CM | POA: Diagnosis not present

## 2021-07-06 DIAGNOSIS — U071 COVID-19: Secondary | ICD-10-CM | POA: Diagnosis not present

## 2021-07-06 MED ORDER — HYDROCODONE BIT-HOMATROP MBR 5-1.5 MG/5ML PO SOLN
5.0000 mL | Freq: Every evening | ORAL | 0 refills | Status: DC | PRN
Start: 2021-07-06 — End: 2021-07-16
  Filled 2021-07-06: qty 120, 12d supply, fill #0

## 2021-07-06 MED ORDER — MOLNUPIRAVIR EUA 200MG CAPSULE
4.0000 | ORAL_CAPSULE | Freq: Two times a day (BID) | ORAL | 0 refills | Status: AC
  Filled 2021-07-06: qty 40, 5d supply, fill #0

## 2021-07-06 NOTE — Telephone Encounter (Signed)
left voicemail to call back to chart for 3:40 virtual visit

## 2021-07-06 NOTE — Patient Instructions (Signed)
.   Please review the attached list of medications and notify my office if there are any errors.   . Please bring all of your medications to every appointment so we can make sure that our medication list is the same as yours.   

## 2021-07-06 NOTE — Progress Notes (Signed)
MyChart Video Visit    Virtual Visit via Video Note   This visit type was conducted due to national recommendations for restrictions regarding the COVID-19 Pandemic (e.g. social distancing) in an effort to limit this patient's exposure and mitigate transmission in our community. This patient is at least at moderate risk for complications without adequate follow up. This format is felt to be most appropriate for this patient at this time. Physical exam was limited by quality of the video and audio technology used for the visit.   Patient location: home Provider location: [facility]  I discussed the limitations of evaluation and management by telemedicine and the availability of in person appointments. The patient expressed understanding and agreed to proceed.  Patient: Michaela Jones   DOB: 07/26/1976   45 y.o. Female  MRN: [id number] Visit Date: 07/06/2021  Today's healthcare provider: Lelon Huh, MD   No chief complaint on file.  Subjective    HPI  Patient reports ongoing flu like symptoms. Had e-visit on 07/02/21 [facility] where she was prescribed albuterol and benzonatate 100 mg. Home covid test was positive on 12/7. 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 Monday morning temp was 101.7 severe cough with greenish yellow phlegm (is very thick and sometimes so thick she almost gags to get it up), sore throat, tightening in chest, horse at times and can be hard to swallow. Temp today was 100 and has been taking tylenol and oxy that was prescribed previously. In taking plenty of fluids and crackers. No appetite and has to be thin so that throat is not irritated. No sense of taste. Does not feel the tessalon is helping with her cough. Would like something that allows her to return to work as soon as possible as she has been trying to get proper treatment since Wednesday.  Has been  off of Eliquis for the last month. Has not had    Medications: Outpatient Medications Prior to Visit  Medication Sig   albuterol (PROVENTIL HFA;VENTOLIN HFA) 108 (90 Base) MCG/ACT inhaler Inhale 2 puffs into the lungs every 6 (six) hours as needed for wheezing or shortness of breath.   albuterol (PROVENTIL) (5 MG/ML) 0.5% nebulizer solution Inhale into the lungs.   albuterol (VENTOLIN HFA) 108 (90 Base) MCG/ACT inhaler Inhale 2 puffs into the lungs every 6 (six) hours as needed for wheezing or shortness of breath.   ALPRAZolam (XANAX) 1 MG tablet TAKE 1/2 TO 1 TABLET BY MOUTH THREE TIMES DAILY AS NEEDED FOR ANXIETY   apixaban (ELIQUIS) 5 MG TABS tablet TAKE 1 TABLET BY MOUTH TWICE DAILY   Armodafinil 150 MG tablet TAKE 1 TABLET BY MOUTH EVERY MORNING   benzonatate (TESSALON) 100 MG capsule Take 1 capsule (100 mg total) by mouth 3 (three) times daily as needed.   cetirizine (ZYRTEC) 10 MG tablet Take 10 mg by mouth daily as needed for allergies.   Cholecalciferol (VITAMIN D) 50 MCG (2000 UT) CAPS Take by mouth.   COVID-19 At Home Antigen Test [facility] COVID-19 HOME TEST) KIT Use as directed (Patient not taking: Reported on 06/19/2021)   cyanocobalamin (,VITAMIN B-12,) 1000 MCG/ML injection INJECT 1 MILLILITER (ML) ONCE MONTHLY   fluticasone (FLONASE) 50 MCG/ACT nasal spray Place 2 sprays into both nostrils daily.   HYDROcodone-acetaminophen (NORCO) 10-325 MG tablet Take 1 tablet by mouth every 8 (eight) hours as needed for severe  pain.   Insulin Pen Needle (NOVOFINE PEN NEEDLE) 32G X 6 MM MISC Use daily with saxenda. (Patient not taking: Reported on 01/05/2021)   Lemborexant (DAYVIGO) 10 MG TABS Take 1 tablet by mouth at bedtime.   Lemborexant (DAYVIGO) 10 MG TABS TAKE 1 TABLET BY MOUTH AT BEDTIME.   levothyroxine (SYNTHROID) 125 MCG tablet Take 1 tablet (125 mcg total) by mouth daily. Please schedule an office visit before anymore refills.   Multiple Vitamin (MULTIVITAMIN) tablet Take 1  tablet by mouth daily.   promethazine (PHENERGAN) 25 MG tablet Take 1 tablet (25 mg total) by mouth every 8 (eight) hours as needed for nausea or vomiting. (Patient not taking: Reported on 06/19/2021)   No facility-administered medications prior to visit.    Review of Systems    Objective    There were no vitals taken for this visit.   Physical Exam   Awake, alert, oriented x 3. In no apparent distress   Assessment & Plan     1. COVID-19  - molnupiravir EUA (LAGEVRIO) 200 mg CAPS capsule; Take 4 capsules (800 mg total) by mouth 2 (two) times daily for 5 days.  Dispense: 40 capsule; Refill: 0  2. Acute cough  - HYDROcodone bit-homatropine (HYCODAN) 5-1.5 MG/5ML syrup; Take 5-10 mLs by mouth at bedtime as needed for cough.  Dispense: 120 mL; Refill: 0  Counseled not take hydrocodone/apap with six hours of hycodan  Work excuse for this week, RTW 07-09-2021  Call if symptoms change or if not rapidly improving.        I discussed the assessment and treatment plan with the patient. The patient was provided an opportunity to ask questions and all were answered. The patient agreed with the plan and demonstrated an understanding of the instructions.   The patient was advised to call back or seek an in-person evaluation if the symptoms worsen or if the condition fails to improve as anticipated.  I provided 12 minutes of non-face-to-face time during this encounter.  The entirety of the information documented in the History of Present Illness, Review of Systems and Physical Exam were personally obtained by me. Portions of this information were initially documented by the CMA and reviewed by me for thoroughness and accuracy.    Lelon Huh, MD [facility] [phone] (phone) [phone] (fax)  [place]

## 2021-07-12 ENCOUNTER — Other Ambulatory Visit: Payer: Self-pay

## 2021-07-12 ENCOUNTER — Ambulatory Visit
Admission: RE | Admit: 2021-07-12 | Discharge: 2021-07-12 | Disposition: A | Discharge status: Home / Self Care | Payer: BC Managed Care – PPO | Source: Ambulatory Visit | Attending: Family Medicine | Admitting: Family Medicine

## 2021-07-12 ENCOUNTER — Encounter: Payer: Self-pay | Admitting: Oncology

## 2021-07-12 VITALS — BP 119/81 | HR 78 | Temp 98.5°F | Resp 16

## 2021-07-12 DIAGNOSIS — J029 Acute pharyngitis, unspecified: Secondary | ICD-10-CM

## 2021-07-12 DIAGNOSIS — G9331 Postviral fatigue syndrome: Secondary | ICD-10-CM

## 2021-07-12 DIAGNOSIS — K209 Esophagitis, unspecified without bleeding: Secondary | ICD-10-CM

## 2021-07-12 MED ORDER — PREDNISONE 20 MG PO TABS
40.0000 mg | ORAL_TABLET | Freq: Every day | ORAL | 0 refills | Status: AC
  Filled 2021-07-12: qty 10, 5d supply, fill #0

## 2021-07-12 MED ORDER — LIDOCAINE VISCOUS HCL 2 % MT SOLN
15.0000 mL | OROMUCOSAL | 0 refills | Status: DC | PRN
Start: 2021-07-12 — End: 2022-05-27
  Filled 2021-07-12: qty 50, 3d supply, fill #0

## 2021-07-12 NOTE — ED Provider Notes (Signed)
Roderic Palau    CSN: [id number] Arrival date & time: 07/12/21  1741      History   Chief Complaint No chief complaint on file.   HPI Michaela Jones is a 45 y.o. female.   HPI Patient presents today with prolonged viral symptoms which developed over 2 weeks ago.  Patient reports that she had a telehealth visit and was diagnosed with suspected influenza.  The following week she subsequently took a at home COVID test after being told by a fellow coworker that she had tested positive for COVID and was unable to determine based on the at home COVID results if she had a positive result.  Her PCP treated her with antivirals which she completed yesterday.  She continues to have cough but her most worrisome symptom is sore throat as she reports dysphagia-like symptoms with swallowing solid foods.     Vital signs are stable and she is afebrile at present.  Past Medical History:  Diagnosis Date   Anemia    Chronic back pain    Complication of anesthesia    HARD TO WAKE UP/OXYGEN LEVEL DROPPED AFTER BACK SURGERY X1   Constipation, chronic    Depression    DVT (deep venous thrombosis) (HCC)    Edema    History of seasonal allergies    pt has inhalers for this if needed   Hypotension    Hypothyroid    Insomnia    Lymphedema of both lower extremities    Migraine    Restless leg syndrome    Vitamin D deficiency     Patient Active Problem List   Diagnosis Date Noted   DVT of deep femoral vein ([place]) 05/26/2020   Chronic pain disorder 05/16/2020   Chronic, continuous use of opioids 05/16/2020   Anxiety 06/17/2018   Lumbar postlaminectomy syndrome 10/23/2017   Lumbosacral radiculopathy 10/23/2017   Vitamin D deficiency 03/01/2015   Acquired spondylolisthesis 03/01/2015   Elevated LDL cholesterol level 03/01/2015   Chronic constipation 03/01/2015   Dysthymia 11/11/2013   Absolute anemia 10/15/2012   Gastric bypass status for obesity 10/15/2012   Insomnia 09/03/2007    Allergic rhinitis 06/15/2007   Iron deficiency anemia 01/22/2007   Restless leg 09/23/2006   Headache, migraine 08/27/2006   Acquired lymphedema 07/29/1998   Adult hypothyroidism 07/29/1992    Past Surgical History:  Procedure Laterality Date   ABDOMINOPLASTY  2004   ABLATION  2014   BACK SURGERY  10/2012   BREAST ENHANCEMENT SURGERY  07/2008   BREAST SURGERY  09/05/2011   removal of implants   CHOLECYSTECTOMY N/A 04/16/2016   Procedure: LAPAROSCOPIC CHOLECYSTECTOMY WITH INTRAOPERATIVE CHOLANGIOGRAM;  Surgeon: Robert Bellow, MD;  Location: [facility] ORS;  Service: General;  Laterality: N/A;   GASTRIC BYPASS  2002   Lower body  01/2008, 03/2008   lift legs and thighs    OB History   No obstetric history on file.      Home Medications    Prior to Admission medications   Medication Sig Start Date End Date Taking? Authorizing Provider  lidocaine (XYLOCAINE) 2 % solution Use as directed 15 mLs in the mouth or throat as needed for mouth pain. 07/12/21  Yes Scot Jun, FNP  predniSONE (DELTASONE) 20 MG tablet Take 2 tablets (40 mg total) by mouth daily with breakfast for 5 days. 07/12/21 07/17/21 Yes Scot Jun, FNP  albuterol (PROVENTIL HFA;VENTOLIN HFA) 108 (90 Base) MCG/ACT inhaler Inhale 2 puffs into the lungs every  6 (six) hours as needed for wheezing or shortness of breath.    [provider]  albuterol (PROVENTIL) (5 MG/ML) 0.5% nebulizer solution Inhale into the lungs.    [provider]  albuterol (VENTOLIN HFA) 108 (90 Base) MCG/ACT inhaler Inhale 2 puffs into the lungs every 6 (six) hours as needed for wheezing or shortness of breath. 07/02/21   Mar Daring, PA-C  ALPRAZolam Duanne Moron) 1 MG tablet TAKE 1/2 TO 1 TABLET BY MOUTH THREE TIMES DAILY AS NEEDED FOR ANXIETY 04/03/21 08/12/21  Thayer Headings, PMHNP  apixaban (ELIQUIS) 5 MG TABS tablet TAKE 1 TABLET BY MOUTH TWICE DAILY 09/26/20 09/26/21  Birdie Sons, MD  Armodafinil 150 MG tablet  TAKE 1 TABLET BY MOUTH EVERY MORNING 03/07/21 09/03/21  Thayer Headings, PMHNP  benzonatate (TESSALON) 100 MG capsule Take 1 capsule (100 mg total) by mouth 3 (three) times daily as needed. 07/02/21   Mar Daring, PA-C  cetirizine (ZYRTEC) 10 MG tablet Take 10 mg by mouth daily as needed for allergies.    [provider]  Cholecalciferol (VITAMIN D) 50 MCG (2000 UT) CAPS Take by mouth.    [provider]  COVID-19 At Home Antigen Test [facility] COVID-19 HOME TEST) KIT Use as directed Patient not taking: Reported on 06/19/2021 12/08/20   Letta Median, RPH  cyanocobalamin (,VITAMIN B-12,) 1000 MCG/ML injection INJECT 1 MILLILITER (ML) ONCE MONTHLY 04/07/20   Trinna Post, PA-C  fluticasone (FLONASE) 50 MCG/ACT nasal spray Place 2 sprays into both nostrils daily. 12/17/17   Trinna Post, PA-C  HYDROcodone bit-homatropine (HYCODAN) 5-1.5 MG/5ML syrup Take 5-10 mLs by mouth at bedtime as needed for cough. 07/06/21   Birdie Sons, MD  HYDROcodone-acetaminophen (NORCO) 10-325 MG tablet Take 1 tablet by mouth every 8 (eight) hours as needed for severe pain. 07/02/21   Birdie Sons, MD  Insulin Pen Needle (NOVOFINE PEN NEEDLE) 32G X 6 MM MISC Use daily with saxenda. 04/27/20   Trinna Post, PA-C  Lemborexant (DAYVIGO) 10 MG TABS Take 1 tablet by mouth at bedtime. 01/24/21 07/23/21  Thayer Headings, PMHNP  Lemborexant (DAYVIGO) 10 MG TABS TAKE 1 TABLET BY MOUTH AT BEDTIME. 02/06/21 08/05/21  Thayer Headings, PMHNP  levothyroxine (SYNTHROID) 125 MCG tablet Take 1 tablet (125 mcg total) by mouth daily. Please schedule an office visit before anymore refills. 02/13/21   Birdie Sons, MD  Multiple Vitamin (MULTIVITAMIN) tablet Take 1 tablet by mouth daily.    [provider]  promethazine (PHENERGAN) 25 MG tablet Take 1 tablet (25 mg total) by mouth every 8 (eight) hours as needed for nausea or vomiting. 05/01/21   Birdie Sons, MD    Family History Family  History  Problem Relation Age of Onset   Pulmonary embolism Mother    Depression Mother    Asthma Mother    Heart attack Father    Cancer Father     Social History Social History   Tobacco Use   Smoking status: Never   Smokeless tobacco: Never  Vaping Use   Vaping Use: Never used  Substance Use Topics   Alcohol use: Yes    Alcohol/week: 0.0 standard drinks    Comment: Rare   Drug use: No     Allergies   Lyrica [pregabalin], Gabapentin, Nsaids, and Penicillins   Review of Systems Review of Systems Pertinent negatives listed in HPI   Physical Exam Triage Vital Signs ED Triage Vitals  Enc Vitals Group  BP 07/12/21 1827 119/81  °   Pulse Rate 07/12/21 1827 78  °   Resp 07/12/21 1827 16  °   Temp 07/12/21 1827 98.5 °F (36.9 °C)  °   Temp Source 07/12/21 1827 Oral  °   SpO2 07/12/21 1827 95 %  °   Weight --   °   Height --   °   Head Circumference --   °   Peak Flow --   °   Pain Score 07/12/21 1826 8  °   Pain Loc --   °   Pain Edu? --   °   Excl. in GC? --   ° °No data found. ° °Updated Vital Signs °BP 119/81 (BP Location: Left Arm)    Pulse 78    Temp 98.5 °F (36.9 °C) (Oral)    Resp 16    SpO2 95%  ° °Visual Acuity °Right Eye Distance:   °Left Eye Distance:   °Bilateral Distance:   ° °Right Eye Near:   °Left Eye Near:    °Bilateral Near:    ° °Physical Exam °Constitutional:   °   Appearance: She is obese.  °HENT:  °   Head: Normocephalic and atraumatic.  °   Nose: Nose normal.  °   Mouth/Throat:  °   Mouth: Mucous membranes are moist.  °   Pharynx: Pharyngeal swelling, posterior oropharyngeal erythema and uvula swelling present.  °   Tonsils: 3+ on the right. 3+ on the left.  °Eyes:  °   Extraocular Movements: Extraocular movements intact.  °   Pupils: Pupils are equal, round, and reactive to light.  °Cardiovascular:  °   Rate and Rhythm: Normal rate and regular rhythm.  °Pulmonary:  °   Effort: Pulmonary effort is normal.  °Skin: °   Capillary Refill: Capillary refill takes  less than 2 seconds.  °Neurological:  °   General: No focal deficit present.  °   Mental Status: She is oriented to person, place, and time.  °Psychiatric:     °   Mood and Affect: Mood normal.     °   Behavior: Behavior normal.  ° ° ° °UC Treatments / Results  °Labs °(all labs ordered are listed, but only abnormal results are displayed) °Labs Reviewed - No data to display ° °EKG ° ° °Radiology °No results found. ° °Procedures °Procedures (including critical care time) ° °Medications Ordered in UC °Medications - No data to display ° °Initial Impression / Assessment and Plan / UC Course  °I have reviewed the triage vital signs and the nursing notes. ° °Pertinent labs & imaging results that were available during my care of the patient were reviewed by me and considered in my medical decision making (see chart for details). ° °  °Post viral syndrome with acute pharyngitis and esophagitis  °Prednisone 40 mg x 5 days. °Lidocaine viscous 15 ml every 2 hours as needed for throat pain. °Strict return precautions. °Final Clinical Impressions(s) / UC Diagnoses  ° °Final diagnoses:  °Post viral syndrome  °Acute viral pharyngitis  °Acute esophagitis  ° °Discharge Instructions   °None °  ° °ED Prescriptions   ° ° Medication Sig Dispense Auth. Provider  ° predniSONE (DELTASONE) 20 MG tablet Take 2 tablets (40 mg total) by mouth daily with breakfast for 5 days. 10 tablet ,  S, FNP  ° lidocaine (XYLOCAINE) 2 % solution Use as directed 15 mLs in the mouth or throat as needed for   mouth pain. 50 mL ,  S, FNP  ° °  ° °PDMP not reviewed this encounter. °  °,  S, FNP °07/12/21 1957 ° °

## 2021-07-12 NOTE — ED Triage Notes (Signed)
Pt was dx with Flu 2 weeks ago through a tele health visit a few days later patient was not better and followed up with her PCP. She was advised to take an AT home Covid test but could not determine if it was positive so she was dx with Covid. She was prescribed anti-virals. She continues to have a cough and ST that is not getting better.

## 2021-07-13 ENCOUNTER — Other Ambulatory Visit: Payer: Self-pay

## 2021-07-16 ENCOUNTER — Other Ambulatory Visit: Payer: Self-pay | Admitting: Family Medicine

## 2021-07-16 ENCOUNTER — Telehealth: Payer: 59 | Admitting: Family Medicine

## 2021-07-16 DIAGNOSIS — R051 Acute cough: Secondary | ICD-10-CM

## 2021-07-16 NOTE — Telephone Encounter (Signed)
Requested medication (s) are due for refill today: filled 10 days ago  Requested medication (s) are on the active medication list: yes  Last refill:  07/06/21  Future visit scheduled: no  Notes to clinic:  pt was to call back if not rapidly improving (COVID) but pt canceled appt 07/16/21, requesting refill of Hycodan which is not on a protocol to review, please assess.   Requested Prescriptions  Pending Prescriptions Disp Refills   HYDROcodone bit-homatropine (HYCODAN) 5-1.5 MG/5ML syrup 120 mL 0    Sig: Take 5-10 mLs by mouth at bedtime as needed for cough.     Off-Protocol Failed - 07/16/2021 11:38 AM      Failed - Medication not assigned to a protocol, review manually.      Passed - Valid encounter within last 12 months    Recent Outpatient Visits           1 week ago COVID-19   [facility] Malva Limes, MD   1 year ago Deep venous thrombosis of right profunda femoris vein [facility])   [facility] Malva Limes, MD   1 year ago Chronic low back pain, unspecified back pain laterality, unspecified whether sciatica present   [facility] Malva Limes, MD   1 year ago Chronic low back pain, unspecified back pain laterality, unspecified whether sciatica present   [facility] Trey Sailors, PA-C   1 year ago Annual physical exam   [facility] Osvaldo Angst M, [place]

## 2021-07-17 ENCOUNTER — Other Ambulatory Visit: Payer: Self-pay

## 2021-07-17 MED ORDER — HYDROCODONE BIT-HOMATROP MBR 5-1.5 MG/5ML PO SOLN
5.0000 mL | Freq: Every evening | ORAL | 0 refills | Status: DC | PRN
  Filled 2021-07-17: qty 120, 12d supply, fill #0

## 2021-07-30 ENCOUNTER — Other Ambulatory Visit: Payer: Self-pay

## 2021-07-30 ENCOUNTER — Other Ambulatory Visit: Payer: Self-pay | Admitting: Psychiatry

## 2021-07-30 ENCOUNTER — Other Ambulatory Visit: Payer: Self-pay | Admitting: Family Medicine

## 2021-07-30 DIAGNOSIS — M961 Postlaminectomy syndrome, not elsewhere classified: Secondary | ICD-10-CM

## 2021-07-30 DIAGNOSIS — F1190 Opioid use, unspecified, uncomplicated: Secondary | ICD-10-CM

## 2021-07-30 DIAGNOSIS — I82411 Acute embolism and thrombosis of right femoral vein: Secondary | ICD-10-CM

## 2021-07-30 DIAGNOSIS — F5101 Primary insomnia: Secondary | ICD-10-CM

## 2021-07-30 MED ORDER — HYDROCODONE-ACETAMINOPHEN 10-325 MG PO TABS
1.0000 | ORAL_TABLET | Freq: Three times a day (TID) | ORAL | 0 refills | Status: DC | PRN
  Filled 2021-07-30 – 2021-07-31 (×4): qty 90, 30d supply, fill #0

## 2021-07-31 ENCOUNTER — Encounter: Payer: Self-pay | Admitting: Oncology

## 2021-07-31 ENCOUNTER — Other Ambulatory Visit: Payer: Self-pay

## 2021-07-31 MED ORDER — DAYVIGO 10 MG PO TABS
1.0000 | ORAL_TABLET | Freq: Every day | ORAL | 1 refills | Status: DC
  Filled 2021-07-31: qty 30, 30d supply, fill #0

## 2021-07-31 NOTE — Telephone Encounter (Signed)
Last filled 12/6 due back in February

## 2021-08-08 ENCOUNTER — Other Ambulatory Visit: Payer: Self-pay

## 2021-08-09 ENCOUNTER — Other Ambulatory Visit: Payer: Self-pay

## 2021-08-29 ENCOUNTER — Other Ambulatory Visit: Payer: Self-pay | Admitting: Family Medicine

## 2021-08-29 DIAGNOSIS — F1190 Opioid use, unspecified, uncomplicated: Secondary | ICD-10-CM

## 2021-08-29 DIAGNOSIS — E039 Hypothyroidism, unspecified: Secondary | ICD-10-CM

## 2021-08-29 DIAGNOSIS — M961 Postlaminectomy syndrome, not elsewhere classified: Secondary | ICD-10-CM

## 2021-08-29 DIAGNOSIS — I82411 Acute embolism and thrombosis of right femoral vein: Secondary | ICD-10-CM

## 2021-08-30 ENCOUNTER — Other Ambulatory Visit: Payer: Self-pay

## 2021-08-30 ENCOUNTER — Encounter: Payer: Self-pay | Admitting: Oncology

## 2021-08-30 MED ORDER — HYDROCODONE-ACETAMINOPHEN 10-325 MG PO TABS
1.0000 | ORAL_TABLET | Freq: Three times a day (TID) | ORAL | 0 refills | Status: DC | PRN
  Filled 2021-08-30: qty 90, 30d supply, fill #0

## 2021-08-30 MED ORDER — LEVOTHYROXINE SODIUM 125 MCG PO TABS
125.0000 ug | ORAL_TABLET | Freq: Every day | ORAL | 4 refills | Status: DC
  Filled 2021-08-30: qty 90, 90d supply, fill #0
  Filled 2021-11-22: qty 21, 21d supply, fill #1
  Filled 2022-01-23: qty 90, 90d supply, fill #2

## 2021-08-30 NOTE — Telephone Encounter (Signed)
Requested medication (s) are due for refill today: yes  Requested medication (s) are on the active medication list: yes  Last refill:  Synthroid 02/13/21 #90 with 0 RF, Norco 07/30/21 #90 with 0 RF  Future visit scheduled: No, canceled Dec appt.  Notes to clinic:  Labs for Synthroid are out of date, 03/2020, Norco not  delegated, please assess.      Requested Prescriptions  Pending Prescriptions Disp Refills   levothyroxine (SYNTHROID) 125 MCG tablet 90 tablet 0    Sig: Take 1 tablet (125 mcg total) by mouth daily. Please schedule an office visit before anymore refills.     Endocrinology:  Hypothyroid Agents Failed - 08/29/2021  8:44 PM      Failed - TSH in normal range and within 360 days    TSH  Date Value Ref Range Status  04/07/2020 20.500 (H) 0.450 - 4.500 uIU/mL Final          Passed - Valid encounter within last 12 months    Recent Outpatient Visits           1 month ago COVID-19   [facility] Malva Limes, MD   1 year ago Deep venous thrombosis of right profunda femoris vein [facility])   [facility] Malva Limes, MD   1 year ago Chronic low back pain, unspecified back pain laterality, unspecified whether sciatica present   [facility] Malva Limes, MD   1 year ago Chronic low back pain, unspecified back pain laterality, unspecified whether sciatica present   [facility] Trey Sailors, PA-C   1 year ago Annual physical exam   [facility] Osvaldo Angst M, PA-C               HYDROcodone-acetaminophen (NORCO) 10-325 MG tablet 90 tablet 0    Sig: Take 1 tablet by mouth every 8 (eight) hours as needed for severe pain.     Not Delegated - Analgesics:  Opioid Agonist Combinations Failed - 08/29/2021  8:44 PM      Failed - This refill cannot be delegated      Failed - Urine Drug Screen completed in last 360 days      Passed - Valid encounter within last 3 months    Recent  Outpatient Visits           1 month ago COVID-19   [facility] Malva Limes, MD   1 year ago Deep venous thrombosis of right profunda femoris vein [facility])   [facility] Malva Limes, MD   1 year ago Chronic low back pain, unspecified back pain laterality, unspecified whether sciatica present   [facility] Malva Limes, MD   1 year ago Chronic low back pain, unspecified back pain laterality, unspecified whether sciatica present   [facility] Trey Sailors, PA-C   1 year ago Annual physical exam   [facility] Osvaldo Angst M, [place]

## 2021-08-31 ENCOUNTER — Encounter: Payer: Self-pay | Admitting: Oncology

## 2021-08-31 ENCOUNTER — Other Ambulatory Visit: Payer: Self-pay

## 2021-09-03 ENCOUNTER — Encounter: Payer: Self-pay | Admitting: Oncology

## 2021-09-03 ENCOUNTER — Other Ambulatory Visit: Payer: Self-pay

## 2021-09-03 ENCOUNTER — Telehealth: Payer: BC Managed Care – PPO | Admitting: Nurse Practitioner

## 2021-09-03 DIAGNOSIS — Z20818 Contact with and (suspected) exposure to other bacterial communicable diseases: Secondary | ICD-10-CM | POA: Diagnosis not present

## 2021-09-03 MED ORDER — AZITHROMYCIN 250 MG PO TABS
ORAL_TABLET | ORAL | 0 refills | Status: AC
  Filled 2021-09-03: qty 6, 5d supply, fill #0

## 2021-09-03 NOTE — Progress Notes (Signed)
E-Visit for Sore Throat - Strep Symptoms ° °We are sorry that you are not feeling well.  Here is how we plan to help! ° °Based on what you have shared with me it is likely that you have strep pharyngitis.  Strep pharyngitis is inflammation and infection in the back of the throat.  This is an infection cause by bacteria and is treated with antibiotics.  I have prescribed Azithromycin 250 mg two tablets today and then one daily for 4 additional days. For throat pain, we recommend over the counter oral pain relief medications such as acetaminophen or aspirin, or anti-inflammatory medications such as ibuprofen or naproxen sodium. Topical treatments such as oral throat lozenges or sprays may be used as needed. Strep infections are not as easily transmitted as other respiratory infections, however we still recommend that you avoid close contact with loved ones, especially the very young and elderly.  Remember to wash your hands thoroughly throughout the day as this is the number one way to prevent the spread of infection and wipe down door knobs and counters with disinfectant. ° ° °Home Care: °Only take medications as instructed by your medical team. °Complete the entire course of an antibiotic. °Do not take these medications with alcohol. °A steam or ultrasonic humidifier can help congestion.  You can place a towel over your head and breathe in the steam from hot water coming from a faucet. °Avoid close contacts especially the very young and the elderly. °Cover your mouth when you cough or sneeze. °Always remember to wash your hands. ° °Get Help Right Away If: °You develop worsening fever or sinus pain. °You develop a severe head ache or visual changes. °Your symptoms persist after you have completed your treatment plan. ° °Make sure you °Understand these instructions. °Will watch your condition. °Will get help right away if you are not doing well or get worse. ° ° °Thank you for choosing an e-visit. ° °Your e-visit  answers were reviewed by a board certified advanced clinical practitioner to complete your personal care plan. Depending upon the condition, your plan could have included both over the counter or prescription medications. ° °Please review your pharmacy choice. Make sure the pharmacy is open so you can pick up prescription now. If there is a problem, you may contact your provider through MyChart messaging and have the prescription routed to another pharmacy.  Your safety is important to us. If you have drug allergies check your prescription carefully.  ° °For the next 24 hours you can use MyChart to ask questions about today's visit, request a non-urgent call back, or ask for a work or school excuse. °You will get an email in the next two days asking about your experience. I hope that your e-visit has been valuable and will speed your recovery.  ° °I spent approximately 5 minutes reviewing the patient's history, current symptoms and coordinating their plan of care today.   ° °Meds ordered this encounter  °Medications  ° azithromycin (ZITHROMAX) 250 MG tablet  °  Sig: Take 2 tablets on day 1, then 1 tablet daily on days 2 through 5  °  Dispense:  6 tablet  °  Refill:  0  °  °

## 2021-09-05 ENCOUNTER — Telehealth: Payer: BC Managed Care – PPO | Admitting: Physician Assistant

## 2021-09-05 ENCOUNTER — Other Ambulatory Visit: Payer: Self-pay

## 2021-09-05 ENCOUNTER — Other Ambulatory Visit: Payer: Self-pay | Admitting: Psychiatry

## 2021-09-05 DIAGNOSIS — F411 Generalized anxiety disorder: Secondary | ICD-10-CM

## 2021-09-05 DIAGNOSIS — F5101 Primary insomnia: Secondary | ICD-10-CM

## 2021-09-05 DIAGNOSIS — U071 COVID-19: Secondary | ICD-10-CM

## 2021-09-05 DIAGNOSIS — J020 Streptococcal pharyngitis: Secondary | ICD-10-CM

## 2021-09-05 DIAGNOSIS — B999 Unspecified infectious disease: Secondary | ICD-10-CM

## 2021-09-05 MED ORDER — CLINDAMYCIN HCL 300 MG PO CAPS
300.0000 mg | ORAL_CAPSULE | Freq: Three times a day (TID) | ORAL | 0 refills | Status: DC
  Filled 2021-09-05: qty 30, 10d supply, fill #0

## 2021-09-05 MED ORDER — ALPRAZOLAM 1 MG PO TABS
ORAL_TABLET | ORAL | 1 refills | Status: DC
  Filled 2021-09-05: qty 90, fill #0
  Filled 2021-09-06: qty 90, 30d supply, fill #0
  Filled 2021-09-25 – 2021-10-04 (×2): qty 90, 30d supply, fill #1

## 2021-09-05 NOTE — Progress Notes (Signed)
E-Visit for Sore Throat - Strep Symptoms  We are sorry that you are not feeling well.  Here is how we plan to help!  Based on what you have shared with me it is likely that you have strep pharyngitis.  Strep pharyngitis is inflammation and infection in the back of the throat.  This is an infection cause by bacteria and is treated with antibiotics.  I have prescribed Clindamycin 300 mg three times a day for 10 days. For throat pain, we recommend over the counter oral pain relief medications such as acetaminophen or aspirin, or anti-inflammatory medications such as ibuprofen or naproxen sodium. Topical treatments such as oral throat lozenges or sprays may be used as needed. Strep infections are not as easily transmitted as other respiratory infections, however we still recommend that you avoid close contact with loved ones, especially the very young and elderly.  Remember to wash your hands thoroughly throughout the day as this is the number one way to prevent the spread of infection and wipe down door knobs and counters with disinfectant.   Home Care: Only take medications as instructed by your medical team. Complete the entire course of an antibiotic. Do not take these medications with alcohol. A steam or ultrasonic humidifier can help congestion.  You can place a towel over your head and breathe in the steam from hot water coming from a faucet. Avoid close contacts especially the very young and the elderly. Cover your mouth when you cough or sneeze. Always remember to wash your hands.  Get Help Right Away If: You develop worsening fever or sinus pain. You develop a severe head ache or visual changes. Your symptoms persist after you have completed your treatment plan.  Make sure you Understand these instructions. Will watch your condition. Will get help right away if you are not doing well or get worse.   Thank you for choosing an e-visit.  Your e-visit answers were reviewed by a board  certified advanced clinical practitioner to complete your personal care plan. Depending upon the condition, your plan could have included both over the counter or prescription medications.  Please review your pharmacy choice. Make sure the pharmacy is open so you can pick up prescription now. If there is a problem, you may contact your provider through MyChart messaging and have the prescription routed to another pharmacy.  Your safety is important to us. If you have drug allergies check your prescription carefully.   For the next 24 hours you can use MyChart to ask questions about today's visit, request a non-urgent call back, or ask for a work or school excuse. You will get an email in the next two days asking about your experience. I hope that your e-visit has been valuable and will speed your recovery.  I provided 5 minutes of non face-to-face time during this encounter for chart review and documentation.   

## 2021-09-06 ENCOUNTER — Other Ambulatory Visit: Payer: Self-pay

## 2021-09-07 ENCOUNTER — Other Ambulatory Visit: Payer: Self-pay

## 2021-09-11 ENCOUNTER — Encounter: Payer: Self-pay | Admitting: Oncology

## 2021-09-11 ENCOUNTER — Other Ambulatory Visit: Payer: Self-pay | Admitting: *Deleted

## 2021-09-11 DIAGNOSIS — D509 Iron deficiency anemia, unspecified: Secondary | ICD-10-CM

## 2021-09-12 ENCOUNTER — Telehealth: Payer: Self-pay | Admitting: Family Medicine

## 2021-09-12 NOTE — Telephone Encounter (Signed)
Received prior authorization request for Saxenda that was prescribed by Ricki Rodriguez last year. This requires an in-office visit for exam and to measure height and weight. Please advise and schedule office visit if she wants to continue Saxenda.

## 2021-09-12 NOTE — Telephone Encounter (Signed)
I called and spoke with patient's husband Michaela Jones (ok per DPR). I asked to speak with [name], but her husband informed me that she was unavailable upstairs. I advised him of message below.  Michaela Jones states that she hasn't been taking the medication regularly, so the request may have been sent to our office by mistake when she was requesting refills on her other medications. Michaela Jones will relay message to patient and have her call back for appointment if she wants to continue medication.

## 2021-09-18 ENCOUNTER — Inpatient Hospital Stay: Payer: BC Managed Care – PPO | Admitting: Nurse Practitioner

## 2021-09-18 ENCOUNTER — Inpatient Hospital Stay: Payer: BC Managed Care – PPO | Attending: Nurse Practitioner

## 2021-09-18 ENCOUNTER — Inpatient Hospital Stay: Payer: BC Managed Care – PPO

## 2021-09-25 ENCOUNTER — Other Ambulatory Visit: Payer: Self-pay | Admitting: Psychiatry

## 2021-09-25 ENCOUNTER — Other Ambulatory Visit: Payer: Self-pay | Admitting: Family Medicine

## 2021-09-25 DIAGNOSIS — F1190 Opioid use, unspecified, uncomplicated: Secondary | ICD-10-CM

## 2021-09-25 DIAGNOSIS — F4323 Adjustment disorder with mixed anxiety and depressed mood: Secondary | ICD-10-CM

## 2021-09-25 DIAGNOSIS — M961 Postlaminectomy syndrome, not elsewhere classified: Secondary | ICD-10-CM

## 2021-09-25 DIAGNOSIS — I82411 Acute embolism and thrombosis of right femoral vein: Secondary | ICD-10-CM

## 2021-09-26 ENCOUNTER — Other Ambulatory Visit: Payer: Self-pay

## 2021-09-26 MED ORDER — HYDROCODONE-ACETAMINOPHEN 10-325 MG PO TABS
1.0000 | ORAL_TABLET | Freq: Three times a day (TID) | ORAL | 0 refills | Status: DC | PRN
  Filled 2021-09-26 – 2021-09-27 (×2): qty 90, 30d supply, fill #0

## 2021-09-27 ENCOUNTER — Other Ambulatory Visit: Payer: Self-pay

## 2021-09-27 ENCOUNTER — Encounter: Payer: Self-pay | Admitting: Oncology

## 2021-09-27 MED ORDER — ARMODAFINIL 150 MG PO TABS
ORAL_TABLET | Freq: Every morning | ORAL | 0 refills | Status: DC
Start: 2021-09-27 — End: 2021-11-26
  Filled 2021-09-27: qty 30, 30d supply, fill #0

## 2021-10-02 ENCOUNTER — Other Ambulatory Visit: Payer: Self-pay

## 2021-10-04 ENCOUNTER — Other Ambulatory Visit: Payer: Self-pay

## 2021-10-09 ENCOUNTER — Other Ambulatory Visit: Payer: Self-pay

## 2021-10-24 ENCOUNTER — Other Ambulatory Visit: Payer: Self-pay

## 2021-10-24 DIAGNOSIS — M961 Postlaminectomy syndrome, not elsewhere classified: Secondary | ICD-10-CM

## 2021-10-24 DIAGNOSIS — I82411 Acute embolism and thrombosis of right femoral vein: Secondary | ICD-10-CM

## 2021-10-24 DIAGNOSIS — F1190 Opioid use, unspecified, uncomplicated: Secondary | ICD-10-CM

## 2021-10-24 MED ORDER — HYDROCODONE-ACETAMINOPHEN 10-325 MG PO TABS
1.0000 | ORAL_TABLET | Freq: Three times a day (TID) | ORAL | 0 refills | Status: DC | PRN
  Filled 2021-10-24: qty 90, 30d supply, fill #0

## 2021-10-24 NOTE — Telephone Encounter (Signed)
Last refill patient was advised to schedule office visit. ?

## 2021-10-24 NOTE — Telephone Encounter (Signed)
Patient called in asking for refill of Norco to be sent to [facility] pharmacy

## 2021-10-25 ENCOUNTER — Other Ambulatory Visit: Payer: Self-pay

## 2021-10-26 ENCOUNTER — Encounter: Payer: Self-pay | Admitting: Oncology

## 2021-10-26 ENCOUNTER — Other Ambulatory Visit: Payer: Self-pay

## 2021-10-29 ENCOUNTER — Telehealth: Payer: BC Managed Care – PPO | Admitting: Physician Assistant

## 2021-10-29 ENCOUNTER — Other Ambulatory Visit: Payer: Self-pay

## 2021-10-29 DIAGNOSIS — H109 Unspecified conjunctivitis: Secondary | ICD-10-CM | POA: Diagnosis not present

## 2021-10-29 MED ORDER — POLYMYXIN B-TRIMETHOPRIM 10000-0.1 UNIT/ML-% OP SOLN
1.0000 [drp] | OPHTHALMIC | 0 refills | Status: DC
  Filled 2021-10-29: qty 10, 7d supply, fill #0

## 2021-10-29 NOTE — Progress Notes (Signed)
?Virtual Visit Consent  ? ?Michaela Jones, you are scheduled for a virtual visit with a [facility] provider today.   ?  ?Just as with appointments in the office, your consent must be obtained to participate.  Your consent will be active for this visit and any virtual visit you may have with one of our providers in the next 365 days.   ?  ?If you have a MyChart account, a copy of this consent can be sent to you electronically.  All virtual visits are billed to your insurance company just like a traditional visit in the office.   ? ?As this is a virtual visit, video technology does not allow for your provider to perform a traditional examination.  This may limit your provider's ability to fully assess your condition.  If your provider identifies any concerns that need to be evaluated in person or the need to arrange testing (such as labs, EKG, etc.), we will make arrangements to do so.   ?  ?Although advances in technology are sophisticated, we cannot ensure that it will always work on either your end or our end.  If the connection with a video visit is poor, the visit may have to be switched to a telephone visit.  With either a video or telephone visit, we are not always able to ensure that we have a secure connection.    ? ?I need to obtain your verbal consent now.   Are you willing to proceed with your visit today?  ?  ?Michaela Jones has provided verbal consent on 10/29/2021 for a virtual visit (video or telephone). ?  ?Michaela Daring, PA-C  ? ?Date: 10/29/2021 12:53 PM ? ? ?Virtual Visit via Video Note  ? ?Michaela Jones, connected with  Michaela Jones  ([id number], [birth year]) on 10/29/21 at 12:45 PM EDT by a video-enabled telemedicine application and verified that I am speaking with the correct person using two identifiers. ? ?Location: ?Patient: Virtual Visit Location Patient: Mobile ?Provider: Virtual Visit Location Provider: Home Office ?  ?I discussed the limitations of evaluation and management by  telemedicine and the availability of in person appointments. The patient expressed understanding and agreed to proceed.   ? ?History of Present Illness: ?Michaela Jones is a 46 y.o. who identifies as a female who was assigned female at birth, and is being seen today for possible pink eye. ? ?HPI: Conjunctivitis  ?The current episode started today. The onset was sudden. The problem occurs continuously. The problem has been gradually worsening. The problem is mild. Nothing (has been trying antihistamines and OTC drops; not improving) relieves the symptoms. Nothing aggravates the symptoms. Associated symptoms include decreased vision, eye itching, photophobia, congestion, headaches, eye discharge (purulent discharge), eye pain and eye redness. Pertinent negatives include no fever and no double vision. The eye pain is mild. The eye pain is not associated with movement.   ? ? ?Problems:  ?Patient Active Problem List  ? Diagnosis Date Noted  ? DVT of deep femoral vein ([place]) 05/26/2020  ? Chronic pain disorder 05/16/2020  ? Chronic, continuous use of opioids 05/16/2020  ? Anxiety 06/17/2018  ? Lumbar postlaminectomy syndrome 10/23/2017  ? Lumbosacral radiculopathy 10/23/2017  ? Vitamin D deficiency 03/01/2015  ? Acquired spondylolisthesis 03/01/2015  ? Elevated LDL cholesterol level 03/01/2015  ? Chronic constipation 03/01/2015  ? Dysthymia 11/11/2013  ? Absolute anemia 10/15/2012  ? Gastric bypass status for obesity 10/15/2012  ? Insomnia 09/03/2007  ? Allergic rhinitis  06/15/2007  ? Iron deficiency anemia 01/22/2007  ? Restless leg 09/23/2006  ? Headache, migraine 08/27/2006  ? Acquired lymphedema 07/29/1998  ? Adult hypothyroidism 07/29/1992  ?  ?Allergies:  ?Allergies  ?Allergen Reactions  ? Lyrica [Pregabalin] Other (See Comments)  ?  Patient reports neurological problem. Speech disturbances and unable to control motor skills  ? Gabapentin Swelling  ? Nsaids Other (See Comments)  ?  Avoids due to gastric bypass ?Avoids  due to gastric bypass  ? Azithromycin Other (See Comments)  ?  Very low absorption due to bariatric surgery; often resulting in therapeutic failure.  Use alternative abx, such as doxycycline or clarithromycin.  ? Penicillins Rash  ? ?Medications:  ?Current Outpatient Medications:  ?  trimethoprim-polymyxin b (POLYTRIM) ophthalmic solution, Place 1 drop into both eyes every 4 (four) hours. X 5 days, Disp: 10 mL, Rfl: 0 ?  albuterol (PROVENTIL HFA;VENTOLIN HFA) 108 (90 Base) MCG/ACT inhaler, Inhale 2 puffs into the lungs every 6 (six) hours as needed for wheezing or shortness of breath., Disp: , Rfl:  ?  albuterol (PROVENTIL) (5 MG/ML) 0.5% nebulizer solution, Inhale into the lungs., Disp: , Rfl:  ?  albuterol (VENTOLIN HFA) 108 (90 Base) MCG/ACT inhaler, Inhale 2 puffs into the lungs every 6 (six) hours as needed for wheezing or shortness of breath., Disp: 8.5 g, Rfl: 0 ?  ALPRAZolam (XANAX) 1 MG tablet, TAKE 1/2 TO 1 TABLET BY MOUTH THREE TIMES DAILY AS NEEDED FOR ANXIETY, Disp: 90 tablet, Rfl: 1 ?  apixaban (ELIQUIS) 5 MG TABS tablet, TAKE 1 TABLET BY MOUTH TWICE DAILY, Disp: 60 tablet, Rfl: 2 ?  Armodafinil 150 MG tablet, TAKE 1 TABLET BY MOUTH EVERY MORNING, Disp: 30 tablet, Rfl: 0 ?  benzonatate (TESSALON) 100 MG capsule, Take 1 capsule (100 mg total) by mouth 3 (three) times daily as needed., Disp: 30 capsule, Rfl: 0 ?  cetirizine (ZYRTEC) 10 MG tablet, Take 10 mg by mouth daily as needed for allergies., Disp: , Rfl:  ?  Cholecalciferol (VITAMIN D) 50 MCG (2000 UT) CAPS, Take by mouth., Disp: , Rfl:  ?  clindamycin (CLEOCIN) 300 MG capsule, Take 1 capsule (300 mg total) by mouth 3 (three) times daily., Disp: 30 capsule, Rfl: 0 ?  COVID-19 At Home Antigen Test [facility] COVID-19 HOME TEST) KIT, Use as directed (Patient not taking: Reported on 06/19/2021), Disp: 4 each, Rfl: 0 ?  cyanocobalamin (,VITAMIN B-12,) 1000 MCG/ML injection, INJECT 1 MILLILITER (ML) ONCE MONTHLY, Disp: 6 mL, Rfl: 4 ?  fluticasone  (FLONASE) 50 MCG/ACT nasal spray, Place 2 sprays into both nostrils daily., Disp: 16 g, Rfl: 6 ?  HYDROcodone-acetaminophen (NORCO) 10-325 MG tablet, Take 1 tablet by mouth every 8 (eight) hours as needed for severe pain., Disp: 90 tablet, Rfl: 0 ?  Insulin Pen Needle (NOVOFINE PEN NEEDLE) 32G X 6 MM MISC, Use daily with saxenda., Disp: 100 each, Rfl: 0 ?  Lemborexant (DAYVIGO) 10 MG TABS, Take 1 tablet by mouth at bedtime., Disp: 30 tablet, Rfl: 0 ?  Lemborexant (DAYVIGO) 10 MG TABS, TAKE 1 TABLET BY MOUTH AT BEDTIME., Disp: 30 tablet, Rfl: 1 ?  levothyroxine (SYNTHROID) 125 MCG tablet, Take 1 tablet (125 mcg total) by mouth daily. Please schedule an office visit before anymore refills., Disp: 90 tablet, Rfl: 4 ?  lidocaine (XYLOCAINE) 2 % solution, Use as directed 15 mLs in the mouth or throat as needed for mouth pain., Disp: 50 mL, Rfl: 0 ?  Multiple Vitamin (MULTIVITAMIN) tablet, Take 1  tablet by mouth daily., Disp: , Rfl:  ?  promethazine (PHENERGAN) 25 MG tablet, Take 1 tablet (25 mg total) by mouth every 8 (eight) hours as needed for nausea or vomiting., Disp: 30 tablet, Rfl: 2 ? ?Observations/Objective: ?Patient is well-developed, well-nourished in no acute distress.  ?Resting comfortably   ?Head is normocephalic, atraumatic.  ?No labored breathing.  ?Speech is clear and coherent with logical content.  ?Patient is alert and oriented at baseline.  ? ? ?Assessment and Plan: ?1. Bacterial conjunctivitis ?- trimethoprim-polymyxin b (POLYTRIM) ophthalmic solution; Place 1 drop into both eyes every 4 (four) hours. X 5 days  Dispense: 10 mL; Refill: 0 ? ?- Suspect bacterial conjunctivitis ?- Polytrim prescribed ?- Warm compresses ?- Good hand hygiene ?- Seek in person evaluation if not improving or if symptoms worsen ? ?Follow Up Instructions: ?I discussed the assessment and treatment plan with the patient. The patient was provided an opportunity to ask questions and all were answered. The patient agreed with the  plan and demonstrated an understanding of the instructions.  A copy of instructions were sent to the patient via MyChart unless otherwise noted below.  ? ? ?The patient was advised to call back or seek an in-pers

## 2021-10-29 NOTE — Patient Instructions (Signed)
?Michaela Jones, thank you for joining Mar Daring, PA-C for today's virtual visit.  While this provider is not your primary care provider (PCP), if your PCP is located in our provider database this encounter information will be shared with them immediately following your visit. ? ?Consent: ?(Patient) Michaela Jones provided verbal consent for this virtual visit at the beginning of the encounter. ? ?Current Medications: ? ?Current Outpatient Medications:  ?  trimethoprim-polymyxin b (POLYTRIM) ophthalmic solution, Place 1 drop into both eyes every 4 (four) hours. X 5 days, Disp: 10 mL, Rfl: 0 ?  albuterol (PROVENTIL HFA;VENTOLIN HFA) 108 (90 Base) MCG/ACT inhaler, Inhale 2 puffs into the lungs every 6 (six) hours as needed for wheezing or shortness of breath., Disp: , Rfl:  ?  albuterol (PROVENTIL) (5 MG/ML) 0.5% nebulizer solution, Inhale into the lungs., Disp: , Rfl:  ?  albuterol (VENTOLIN HFA) 108 (90 Base) MCG/ACT inhaler, Inhale 2 puffs into the lungs every 6 (six) hours as needed for wheezing or shortness of breath., Disp: 8.5 g, Rfl: 0 ?  ALPRAZolam (XANAX) 1 MG tablet, TAKE 1/2 TO 1 TABLET BY MOUTH THREE TIMES DAILY AS NEEDED FOR ANXIETY, Disp: 90 tablet, Rfl: 1 ?  apixaban (ELIQUIS) 5 MG TABS tablet, TAKE 1 TABLET BY MOUTH TWICE DAILY, Disp: 60 tablet, Rfl: 2 ?  Armodafinil 150 MG tablet, TAKE 1 TABLET BY MOUTH EVERY MORNING, Disp: 30 tablet, Rfl: 0 ?  benzonatate (TESSALON) 100 MG capsule, Take 1 capsule (100 mg total) by mouth 3 (three) times daily as needed., Disp: 30 capsule, Rfl: 0 ?  cetirizine (ZYRTEC) 10 MG tablet, Take 10 mg by mouth daily as needed for allergies., Disp: , Rfl:  ?  Cholecalciferol (VITAMIN D) 50 MCG (2000 UT) CAPS, Take by mouth., Disp: , Rfl:  ?  clindamycin (CLEOCIN) 300 MG capsule, Take 1 capsule (300 mg total) by mouth 3 (three) times daily., Disp: 30 capsule, Rfl: 0 ?  COVID-19 At Home Antigen Test [facility] COVID-19 HOME TEST) KIT, Use as directed (Patient not taking:  Reported on 06/19/2021), Disp: 4 each, Rfl: 0 ?  cyanocobalamin (,VITAMIN B-12,) 1000 MCG/ML injection, INJECT 1 MILLILITER (ML) ONCE MONTHLY, Disp: 6 mL, Rfl: 4 ?  fluticasone (FLONASE) 50 MCG/ACT nasal spray, Place 2 sprays into both nostrils daily., Disp: 16 g, Rfl: 6 ?  HYDROcodone-acetaminophen (NORCO) 10-325 MG tablet, Take 1 tablet by mouth every 8 (eight) hours as needed for severe pain., Disp: 90 tablet, Rfl: 0 ?  Insulin Pen Needle (NOVOFINE PEN NEEDLE) 32G X 6 MM MISC, Use daily with saxenda., Disp: 100 each, Rfl: 0 ?  Lemborexant (DAYVIGO) 10 MG TABS, Take 1 tablet by mouth at bedtime., Disp: 30 tablet, Rfl: 0 ?  Lemborexant (DAYVIGO) 10 MG TABS, TAKE 1 TABLET BY MOUTH AT BEDTIME., Disp: 30 tablet, Rfl: 1 ?  levothyroxine (SYNTHROID) 125 MCG tablet, Take 1 tablet (125 mcg total) by mouth daily. Please schedule an office visit before anymore refills., Disp: 90 tablet, Rfl: 4 ?  lidocaine (XYLOCAINE) 2 % solution, Use as directed 15 mLs in the mouth or throat as needed for mouth pain., Disp: 50 mL, Rfl: 0 ?  Multiple Vitamin (MULTIVITAMIN) tablet, Take 1 tablet by mouth daily., Disp: , Rfl:  ?  promethazine (PHENERGAN) 25 MG tablet, Take 1 tablet (25 mg total) by mouth every 8 (eight) hours as needed for nausea or vomiting., Disp: 30 tablet, Rfl: 2  ? ?Medications ordered in this encounter:  ?Meds ordered this encounter  ?  Medications  ? trimethoprim-polymyxin b (POLYTRIM) ophthalmic solution  ?  Sig: Place 1 drop into both eyes every 4 (four) hours. X 5 days  ?  Dispense:  10 mL  ?  Refill:  0  ?  Order Specific Question:   Supervising Provider  ?  Answer:   Noemi Chapel [[id number]]  ?  ? ?*If you need refills on other medications prior to your next appointment, please contact your pharmacy* ? ?Follow-Up: ?Call back or seek an in-person evaluation if the symptoms worsen or if the condition fails to improve as anticipated. ? ?Other Instructions ? ?Bacterial Conjunctivitis, Adult ?Bacterial conjunctivitis is an  infection of the clear membrane that covers the white part of the eye and the inner surface of the eyelid (conjunctiva). When the blood vessels in the conjunctiva become inflamed, the eye becomes red or pink. The eye often feels irritated or itchy. Bacterial conjunctivitis spreads easily from person to person (is contagious). It also spreads easily from one eye to the other eye. ?What are the causes? ?This condition is caused by bacteria. You may get the infection if you come into close contact with: ?A person who is infected with the bacteria. ?Items that are contaminated with the bacteria, such as a face towel, contact lens solution, or eye makeup. ?What increases the risk? ?You are more likely to develop this condition if: ?You are exposed to other people who have the infection. ?You wear contact lenses. ?You have a sinus infection. ?You have had a recent eye injury or surgery. ?You have a weak body defense system (immune system). ?You have a medical condition that causes dry eyes. ?What are the signs or symptoms? ?Symptoms of this condition include: ?Thick, yellowish discharge from the eye. This may turn into a crust on the eyelid overnight and cause your eyelids to stick together. ?Tearing or watery eyes. ?Itchy eyes. ?Burning feeling in your eyes. ?Eye redness. ?Swollen eyelids. ?Blurred vision. ?How is this diagnosed? ?This condition is diagnosed based on your symptoms and medical history. Your health care provider may also take a sample of discharge from your eye to find the cause of your infection. ?How is this treated? ?This condition may be treated with: ?Antibiotic eye drops or ointment to clear the infection more quickly and prevent the spread of infection to others. ?Antibiotic medicines taken by mouth (orally) to treat infections that do not respond to drops or ointments or that last longer than 10 days. ?Cool, wet cloths (cool compresses) placed on the eyes. ?Artificial tears applied 2-6 times a  day. ?Follow these instructions at home: ?Medicines ?Take or apply your antibiotic medicine as told by your health care provider. Do not stop using the antibiotic, even if your condition improves, unless directed by your health care provider. ?Take or apply over-the-counter and prescription medicines only as told by your health care provider. ?Be very careful to avoid touching the edge of your eyelid with the eye-drop bottle or the ointment tube when you apply medicines to the affected eye. This will keep you from spreading the infection to your other eye or to other people. ?Managing discomfort ?Gently wipe away any drainage from your eye with a warm, wet washcloth or a cotton ball. ?Apply a clean, cool compress to your eye for 10-20 minutes, 3-4 times a day. ?General instructions ?Do not wear contact lenses until the inflammation is gone and your health care provider says it is safe to wear them again. Ask your health care provider how to  sterilize or replace your contact lenses before you use them again. Wear glasses until you can resume wearing contact lenses. ?Avoid wearing eye makeup until the inflammation is gone. Throw away any old eye cosmetics that may be contaminated. ?Change or wash your pillowcase every day. ?Do not share towels or washcloths. This may spread the infection. ?Wash your hands often with soap and water for at least 20 seconds and especially before touching your face or eyes. Use paper towels to dry your hands. ?Avoid touching or rubbing your eyes. ?Do not drive or use heavy machinery if your vision is blurred. ?Contact a health care provider if: ?You have a fever. ?Your symptoms do not get better after 10 days. ?Get help right away if: ?You have a fever and your symptoms suddenly get worse. ?You have severe pain when you move your eye. ?You have facial pain, redness, or swelling. ?You have a sudden loss of vision. ?Summary ?Bacterial conjunctivitis is an infection of the clear membrane  that covers the white part of the eye and the inner surface of the eyelid (conjunctiva). ?Bacterial conjunctivitis spreads easily from eye to eye and from person to person (is contagious). ?Wash your hands o

## 2021-11-05 ENCOUNTER — Other Ambulatory Visit: Payer: Self-pay | Admitting: Psychiatry

## 2021-11-05 ENCOUNTER — Other Ambulatory Visit: Payer: Self-pay

## 2021-11-05 DIAGNOSIS — F411 Generalized anxiety disorder: Secondary | ICD-10-CM

## 2021-11-05 DIAGNOSIS — F5101 Primary insomnia: Secondary | ICD-10-CM

## 2021-11-05 MED ORDER — ALPRAZOLAM 1 MG PO TABS
ORAL_TABLET | ORAL | 0 refills | Status: DC
  Filled 2021-11-05: qty 90, 30d supply, fill #0

## 2021-11-06 ENCOUNTER — Other Ambulatory Visit: Payer: Self-pay

## 2021-11-22 ENCOUNTER — Other Ambulatory Visit: Payer: Self-pay | Admitting: Family Medicine

## 2021-11-22 ENCOUNTER — Other Ambulatory Visit: Payer: Self-pay | Admitting: Psychiatry

## 2021-11-22 ENCOUNTER — Other Ambulatory Visit: Payer: Self-pay

## 2021-11-22 DIAGNOSIS — F411 Generalized anxiety disorder: Secondary | ICD-10-CM

## 2021-11-22 DIAGNOSIS — I82411 Acute embolism and thrombosis of right femoral vein: Secondary | ICD-10-CM

## 2021-11-22 DIAGNOSIS — F1190 Opioid use, unspecified, uncomplicated: Secondary | ICD-10-CM

## 2021-11-22 DIAGNOSIS — F5101 Primary insomnia: Secondary | ICD-10-CM

## 2021-11-22 DIAGNOSIS — F4323 Adjustment disorder with mixed anxiety and depressed mood: Secondary | ICD-10-CM

## 2021-11-22 DIAGNOSIS — M961 Postlaminectomy syndrome, not elsewhere classified: Secondary | ICD-10-CM

## 2021-11-22 NOTE — Telephone Encounter (Signed)
Please call to schedule an appt, past due 

## 2021-11-23 ENCOUNTER — Other Ambulatory Visit: Payer: Self-pay

## 2021-11-23 ENCOUNTER — Encounter: Payer: Self-pay | Admitting: Oncology

## 2021-11-23 NOTE — Telephone Encounter (Signed)
LVM to schedule follow up

## 2021-11-26 ENCOUNTER — Other Ambulatory Visit: Payer: Self-pay

## 2021-11-26 MED ORDER — ALPRAZOLAM 1 MG PO TABS
ORAL_TABLET | ORAL | 0 refills | Status: DC
  Filled 2021-11-26: qty 90, fill #0

## 2021-11-26 MED FILL — Armodafinil Tab 150 MG: ORAL | 30 days supply | Qty: 30 | Fill #0 | Status: CN

## 2021-11-26 NOTE — Telephone Encounter (Signed)
Armodafinil sent. Alprazolam sent with fill date of 12/04/21.  ?

## 2021-11-27 ENCOUNTER — Telehealth (INDEPENDENT_AMBULATORY_CARE_PROVIDER_SITE_OTHER): Payer: Self-pay | Admitting: Family Medicine

## 2021-11-27 ENCOUNTER — Other Ambulatory Visit: Payer: Self-pay

## 2021-11-27 ENCOUNTER — Encounter: Payer: Self-pay | Admitting: Oncology

## 2021-11-27 DIAGNOSIS — E538 Deficiency of other specified B group vitamins: Secondary | ICD-10-CM

## 2021-11-27 DIAGNOSIS — F1190 Opioid use, unspecified, uncomplicated: Secondary | ICD-10-CM

## 2021-11-27 DIAGNOSIS — E039 Hypothyroidism, unspecified: Secondary | ICD-10-CM

## 2021-11-27 DIAGNOSIS — M961 Postlaminectomy syndrome, not elsewhere classified: Secondary | ICD-10-CM

## 2021-11-27 DIAGNOSIS — E559 Vitamin D deficiency, unspecified: Secondary | ICD-10-CM

## 2021-11-27 DIAGNOSIS — D509 Iron deficiency anemia, unspecified: Secondary | ICD-10-CM

## 2021-11-27 DIAGNOSIS — I82411 Acute embolism and thrombosis of right femoral vein: Secondary | ICD-10-CM

## 2021-11-27 DIAGNOSIS — E7800 Pure hypercholesterolemia, unspecified: Secondary | ICD-10-CM

## 2021-11-27 DIAGNOSIS — Z9884 Bariatric surgery status: Secondary | ICD-10-CM

## 2021-11-27 MED ORDER — CYANOCOBALAMIN 1000 MCG/ML IJ SOLN
INTRAMUSCULAR | 4 refills | Status: DC
  Filled 2021-11-27: qty 6, 84d supply, fill #0
  Filled 2022-02-10: qty 6, 84d supply, fill #1
  Filled 2022-08-05: qty 3, 90d supply, fill #2
  Filled 2022-11-19: qty 3, 90d supply, fill #3

## 2021-11-27 MED ORDER — HYDROCODONE-ACETAMINOPHEN 10-325 MG PO TABS
1.0000 | ORAL_TABLET | Freq: Three times a day (TID) | ORAL | 0 refills | Status: DC | PRN
  Filled 2021-11-27: qty 90, 30d supply, fill #0

## 2021-11-27 NOTE — Progress Notes (Signed)
MyChart Video Visit    Virtual Visit via Video Note   This visit type was conducted due to national recommendations for restrictions regarding the COVID-19 Pandemic (e.g. social distancing) in an effort to limit this patient's exposure and mitigate transmission in our community. This patient is at least at moderate risk for complications without adequate follow up. This format is felt to be most appropriate for this patient at this time. Physical exam was limited by quality of the video and audio technology used for the visit.   Patient location: work Provider location: [facility]  I discussed the limitations of evaluation and management by telemedicine and the availability of in person appointments. The patient expressed understanding and agreed to proceed.  Patient: Michaela Jones   DOB: 1975-12-24   46 y.o. Female  MRN: [id number] Visit Date: 11/27/2021  Today's healthcare provider: Lelon Huh, MD   No chief complaint on file. CC: f/u back pain and refill for hydrocodone-acetaminophen, and B-12 Subjective    Follow up for back pain  The patient was last seen for this 5 months ago. Changes made at last visit include continue current medication. She reports excellent compliance with treatment. She feels that condition is Improved. Able to work full time.   She is not having side effects.   She is also due for follow up of several other chronic medical problems including hypothyroid with last tsh of 20.500 on 04/07/2020. She has history gastric bypass with secondary B12 deficiency anemia. She had been self administering B12 injections monthly, but has now been out of this for several months and needs to get back on them. Is also due for follow up of vitamin D deficiency and hypertriglyceridemia.  -----------------------------------------------------------------------------------------   Medications: Outpatient Medications Prior to Visit  Medication Sig   albuterol (PROVENTIL  HFA;VENTOLIN HFA) 108 (90 Base) MCG/ACT inhaler Inhale 2 puffs into the lungs every 6 (six) hours as needed for wheezing or shortness of breath.   albuterol (PROVENTIL) (5 MG/ML) 0.5% nebulizer solution Inhale into the lungs.   albuterol (VENTOLIN HFA) 108 (90 Base) MCG/ACT inhaler Inhale 2 puffs into the lungs every 6 (six) hours as needed for wheezing or shortness of breath.   [START ON 12/04/2021] ALPRAZolam (XANAX) 1 MG tablet TAKE 1/2 TO 1 TABLET BY MOUTH THREE TIMES DAILY AS NEEDED FOR ANXIETY   cetirizine (ZYRTEC) 10 MG tablet Take 10 mg by mouth daily as needed for allergies.   Cholecalciferol (VITAMIN D) 50 MCG (2000 UT) CAPS Take by mouth.   fluticasone (FLONASE) 50 MCG/ACT nasal spray Place 2 sprays into both nostrils daily.   levothyroxine (SYNTHROID) 125 MCG tablet Take 1 tablet (125 mcg total) by mouth daily. Please schedule an office visit before anymore refills.   lidocaine (XYLOCAINE) 2 % solution Use as directed 15 mLs in the mouth or throat as needed for mouth pain.   Multiple Vitamin (MULTIVITAMIN) tablet Take 1 tablet by mouth daily.   promethazine (PHENERGAN) 25 MG tablet Take 1 tablet (25 mg total) by mouth every 8 (eight) hours as needed for nausea or vomiting.   [DISCONTINUED] COVID-19 At Home Antigen Test (CARESTART COVID-19 HOME TEST) KIT Use as directed   [DISCONTINUED] cyanocobalamin (,VITAMIN B-12,) 1000 MCG/ML injection INJECT 1 MILLILITER (ML) ONCE MONTHLY   [DISCONTINUED] HYDROcodone-acetaminophen (NORCO) 10-325 MG tablet Take 1 tablet by mouth every 8 (eight) hours as needed for severe pain.   apixaban (ELIQUIS) 5 MG TABS tablet TAKE 1 TABLET BY MOUTH TWICE DAILY  Armodafinil 150 MG tablet TAKE 1 TABLET BY MOUTH EVERY MORNING (Patient not taking: Reported on 11/27/2021)   benzonatate (TESSALON) 100 MG capsule Take 1 capsule (100 mg total) by mouth 3 (three) times daily as needed. (Patient not taking: Reported on 11/27/2021)   clindamycin (CLEOCIN) 300 MG capsule Take 1  capsule (300 mg total) by mouth 3 (three) times daily.   Insulin Pen Needle (NOVOFINE PEN NEEDLE) 32G X 6 MM MISC Use daily with saxenda. (Patient not taking: Reported on 11/27/2021)   Lemborexant (DAYVIGO) 10 MG TABS TAKE 1 TABLET BY MOUTH AT BEDTIME.   trimethoprim-polymyxin b (POLYTRIM) ophthalmic solution Place 1 drop into both eyes every 4 (four) hours. X 5 days (Patient not taking: Reported on 11/27/2021)   [DISCONTINUED] Lemborexant (DAYVIGO) 10 MG TABS Take 1 tablet by mouth at bedtime.   No facility-administered medications prior to visit.    Review of Systems  Constitutional:  Negative for appetite change, chills, fatigue and fever.  Respiratory:  Negative for chest tightness and shortness of breath.   Cardiovascular:  Negative for chest pain and palpitations.  Gastrointestinal:  Negative for abdominal pain, nausea and vomiting.  Neurological:  Negative for dizziness and weakness.      Objective    There were no vitals taken for this visit.   Physical Exam   Awake, alert, oriented x 3. In no apparent distress    Assessment & Plan    1. B12 deficiency Off of injections for several months. Will restart self administered cyanocobalamin (,VITAMIN B-12,) 1000 MCG/ML injection; INJECT 1 MILLILITER (ML) EVERY TWO WEEKS FOR THREE MONTHS THEN ONCE A  MONTH  Dispense: 6 mL; Refill: 4  2. Chronic, continuous use of opioids Pain adequately controlled, refill HYDROcodone-acetaminophen (NORCO) 10-325 MG tablet; Take 1 tablet by mouth every 8 (eight) hours as needed for severe pain.  Dispense: 90 tablet; Refill: 0  3. Lumbar postlaminectomy syndrome  - HYDROcodone-acetaminophen (NORCO) 10-325 MG tablet; Take 1 tablet by mouth every 8 (eight) hours as needed for severe pain.  Dispense: 90 tablet; Refill: 0  4. Adult hypothyroidism  - TSH - T4, free   5. Vitamin D deficiency  - VITAMIN D 25 Hydroxy (Vit-D Deficiency, Fractures)  6. Gastric bypass status for obesity   7.  Elevated LDL cholesterol level Advised need to be fasting 8 hours when comes in to check  Lipid panel  8. Iron deficiency anemia, unspecified iron deficiency anemia type  - Iron, TIBC and Ferritin Panel - CBC      I discussed the assessment and treatment plan with the patient. The patient was provided an opportunity to ask questions and all were answered. The patient agreed with the plan and demonstrated an understanding of the instructions.   The patient was advised to call back or seek an in-person evaluation if the symptoms worsen or if the condition fails to improve as anticipated.  I provided 14 minutes of non-face-to-face time during this encounter.  The entirety of the information documented in the History of Present Illness, Review of Systems and Physical Exam were personally obtained by me. Portions of this information were initially documented by the CMA and reviewed by me for thoroughness and accuracy.    Lelon Huh, MD [facility] [phone] (phone) [phone] (fax)  [place]

## 2021-11-27 NOTE — Patient Instructions (Signed)
.   Please review the attached list of medications and notify my office if there are any errors.   . Please bring all of your medications to every appointment so we can make sure that our medication list is the same as yours.   

## 2021-11-28 ENCOUNTER — Other Ambulatory Visit: Payer: Self-pay

## 2021-11-30 ENCOUNTER — Other Ambulatory Visit: Payer: Self-pay

## 2021-12-03 ENCOUNTER — Telehealth: Payer: Self-pay | Admitting: Psychiatry

## 2021-12-03 DIAGNOSIS — Z91199 Patient's noncompliance with other medical treatment and regimen due to unspecified reason: Secondary | ICD-10-CM

## 2021-12-03 NOTE — Progress Notes (Signed)
Attempted to reach pt after pt was not logged in for My Chart visit. Unable to reach pt. L/M recommending that she call office to reschedule.  ?

## 2021-12-06 ENCOUNTER — Other Ambulatory Visit: Payer: Self-pay

## 2021-12-06 ENCOUNTER — Telehealth (INDEPENDENT_AMBULATORY_CARE_PROVIDER_SITE_OTHER): Payer: BC Managed Care – PPO | Admitting: Psychiatry

## 2021-12-06 ENCOUNTER — Encounter: Payer: Self-pay | Admitting: Psychiatry

## 2021-12-06 DIAGNOSIS — F5101 Primary insomnia: Secondary | ICD-10-CM | POA: Diagnosis not present

## 2021-12-06 DIAGNOSIS — F4323 Adjustment disorder with mixed anxiety and depressed mood: Secondary | ICD-10-CM | POA: Diagnosis not present

## 2021-12-06 DIAGNOSIS — F411 Generalized anxiety disorder: Secondary | ICD-10-CM | POA: Diagnosis not present

## 2021-12-06 MED ORDER — ALPRAZOLAM 1 MG PO TABS
ORAL_TABLET | ORAL | 5 refills | Status: DC
  Filled 2021-12-06: qty 90, 30d supply, fill #0
  Filled 2022-01-11: qty 90, 30d supply, fill #1
  Filled 2022-02-10: qty 90, 30d supply, fill #2
  Filled 2022-03-14: qty 90, 30d supply, fill #3
  Filled 2022-04-08 – 2022-04-11 (×2): qty 90, 30d supply, fill #4
  Filled 2022-05-09: qty 90, 30d supply, fill #5
  Filled ????-??-??: fill #3

## 2021-12-06 MED ORDER — DAYVIGO 10 MG PO TABS
1.0000 | ORAL_TABLET | Freq: Every day | ORAL | 5 refills | Status: DC
  Filled 2021-12-06: qty 30, 30d supply, fill #0
  Filled 2022-01-04: qty 30, 30d supply, fill #1
  Filled 2022-02-10: qty 30, 30d supply, fill #2
  Filled 2022-03-12 – 2022-03-20 (×2): qty 30, 30d supply, fill #3
  Filled 2022-04-18: qty 30, 30d supply, fill #4
  Filled 2022-05-20 – 2022-05-31 (×3): qty 30, 30d supply, fill #5

## 2021-12-06 MED ORDER — ARMODAFINIL 150 MG PO TABS
ORAL_TABLET | Freq: Every morning | ORAL | 5 refills | Status: DC
Start: 2022-01-03 — End: 2022-06-07
  Filled 2021-12-06 (×2): qty 30, 30d supply, fill #0
  Filled 2022-01-23: qty 30, 30d supply, fill #1
  Filled 2022-02-10 – 2022-03-20 (×2): qty 30, 30d supply, fill #2
  Filled 2022-05-31: qty 30, 30d supply, fill #3

## 2021-12-06 NOTE — Progress Notes (Signed)
Michaela Jones ?[id number] ?[birth year] ?46 y.o. ? ?Virtual Visit via Video Note ? ?I connected with pt @ on 12/06/21 at  2:45 PM EDT by a video enabled telemedicine application and verified that I am speaking with the correct person using two identifiers. ?  ?I discussed the limitations of evaluation and management by telemedicine and the availability of in person appointments. The patient expressed understanding and agreed to proceed. ? ?I discussed the assessment and treatment plan with the patient. The patient was provided an opportunity to ask questions and all were answered. The patient agreed with the plan and demonstrated an understanding of the instructions. ?  ?The patient was advised to call back or seek an in-person evaluation if the symptoms worsen or if the condition fails to improve as anticipated. ? ?I provided 28 minutes of non-face-to-face time during this encounter.  The patient was located at work.  [facility] ? ? ?Corie ChiquitoJessica Truitt Cruey, PMHNP  ? ?Subjective:  ? ?Patient ID:  Michaela Natterara A Macgowan is a 46 y.o. (DOB [birth year]) female. ? ?Chief Complaint:  ?Chief Complaint  ?Patient presents with  ? Anxiety  ? Insomnia  ? ? ?Anxiety ?Symptoms include insomnia.  ? ? ?Insomnia ? ?Michaela Jones presents for follow-up of anxiety, depression, ADHD, and insomnia.  ? ?Has been working 6 days a week. 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 She has 2 classes left to get masters.  ? ?Just got custody of nephew, Ronaldo MiyamotoKyle, a few weeks ago and nephew was diagnosed with Autism. Brother-in-law has custody and Revonda Standardllison and her husband are named as secondary guardians. Brother-in-law was in MVA and was She reports that these stressors have caused her severe anxiety. She reports that medication has been helpful for her anxiety. Denies depressed mood. She has been reading at night to help manage stress. Energy has been low. Motivation has been good. Concentration is improved  with medication. She reports that she is not eating as much since she is working. She reports that she has lost weight and is a size smaller. Denies SI.  ? ?Son is turning 46 yo and daughter is about to turn 46 yo. Aurther Loftephew is 46 yo and they have nephew every evening and on Sundays.  ? ?Insurance changed and new insurance wants her to try to an alternative to [place]vigo. She has not been sleeping well without Dayvigo.  ? ?Past Psychiatric Medication Trials: ?Cymbalta ?Sertraline ?Remeron ?Wellbutrin ?Buspar ?Nuvigil ?Provigil- increased activation ?Ritalin ?Concerta ?Adderall- Not as effective ?Strattera ?Klonopin ?Xanax ?Vistaril- Ineffective ?Ambien- Effective. Caused parasomnias. ?Lunesta ?Temazepam ?Trazodone-Ineffective ?Belsomra ?Doxepin ?Silenor ?Seroquel- wt gain ?Gabapentin- Stuttering ?Lyrica ?Trileptal- tingling in extremities ? ?Review of Systems:  ?Review of Systems  ?Musculoskeletal:  Negative for gait problem.  ?Neurological:  Negative for tremors.  ?Psychiatric/Behavioral:  The patient has insomnia.   ?     Please refer to HPI  ? ?Medications: I have reviewed the patient's current medications. ? ?Current Outpatient Medications  ?Medication Sig Dispense Refill  ? Cholecalciferol (VITAMIN D) 50 MCG (2000 UT) CAPS Take by mouth.    ? cyanocobalamin (,VITAMIN B-12,) 1000 MCG/ML injection INJECT 1 MILLILITER (ML) EVERY TWO WEEKS FOR THREE MONTHS THEN ONCE A  MONTH 6 mL 4  ? levothyroxine (SYNTHROID) 125 MCG tablet Take 1 tablet (125 mcg total) by mouth daily. Please schedule an office visit before anymore refills. 90 tablet 4  ? Multiple Vitamin (MULTIVITAMIN) tablet Take 1 tablet by mouth  daily.    ? albuterol (PROVENTIL HFA;VENTOLIN HFA) 108 (90 Base) MCG/ACT inhaler Inhale 2 puffs into the lungs every 6 (six) hours as needed for wheezing or shortness of breath.    ? albuterol (PROVENTIL) (5 MG/ML) 0.5% nebulizer solution Inhale into the lungs.    ? albuterol (VENTOLIN HFA) 108 (90 Base) MCG/ACT inhaler  Inhale 2 puffs into the lungs every 6 (six) hours as needed for wheezing or shortness of breath. 8.5 g 0  ? [START ON 01/03/2022] ALPRAZolam (XANAX) 1 MG tablet TAKE 1/2 TO 1 TABLET BY MOUTH THREE TIMES DAILY AS NEEDED FOR ANXIETY 90 tablet 5  ? apixaban (ELIQUIS) 5 MG TABS tablet TAKE 1 TABLET BY MOUTH TWICE DAILY (Patient not taking: Reported on 12/06/2021) 60 tablet 2  ? [START ON 01/03/2022] Armodafinil 150 MG tablet TAKE 1 TABLET BY MOUTH EVERY MORNING 30 tablet 5  ? cetirizine (ZYRTEC) 10 MG tablet Take 10 mg by mouth daily as needed for allergies.    ? clindamycin (CLEOCIN) 300 MG capsule Take 1 capsule (300 mg total) by mouth 3 (three) times daily. 30 capsule 0  ? fluticasone (FLONASE) 50 MCG/ACT nasal spray Place 2 sprays into both nostrils daily. 16 g 6  ? HYDROcodone-acetaminophen (NORCO) 10-325 MG tablet Take 1 tablet by mouth every 8 (eight) hours as needed for severe pain. 90 tablet 0  ? Insulin Pen Needle (NOVOFINE PEN NEEDLE) 32G X 6 MM MISC Use daily with saxenda. (Patient not taking: Reported on 11/27/2021) 100 each 0  ? Lemborexant (DAYVIGO) 10 MG TABS TAKE 1 TABLET BY MOUTH AT BEDTIME. 30 tablet 5  ? lidocaine (XYLOCAINE) 2 % solution Use as directed 15 mLs in the mouth or throat as needed for mouth pain. 50 mL 0  ? promethazine (PHENERGAN) 25 MG tablet Take 1 tablet (25 mg total) by mouth every 8 (eight) hours as needed for nausea or vomiting. 30 tablet 2  ? trimethoprim-polymyxin b (POLYTRIM) ophthalmic solution Place 1 drop into both eyes every 4 (four) hours. X 5 days (Patient not taking: Reported on 11/27/2021) 10 mL 0  ? ?No current facility-administered medications for this visit.  ? ? ?Medication Side Effects: None ? ?Allergies:  ?Allergies  ?Allergen Reactions  ? Lyrica [Pregabalin] Other (See Comments)  ?  Patient reports neurological problem. Speech disturbances and unable to control motor skills  ? Gabapentin Swelling  ? Nsaids Other (See Comments)  ?  Avoids due to gastric bypass ?Avoids due  to gastric bypass  ? Azithromycin Other (See Comments)  ?  Very low absorption due to bariatric surgery; often resulting in therapeutic failure.  Use alternative abx, such as doxycycline or clarithromycin.  ? Penicillins Rash  ? ? ?Past Medical History:  ?Diagnosis Date  ? Anemia   ? Chronic back pain   ? Complication of anesthesia   ? HARD TO WAKE UP/OXYGEN LEVEL DROPPED AFTER BACK SURGERY X1  ? Constipation, chronic   ? Depression   ? DVT (deep venous thrombosis) (HCC)   ? Edema   ? Factor 5 Leiden mutation, heterozygous (HCC)   ? History of seasonal allergies   ? pt has inhalers for this if needed  ? Hypotension   ? Hypothyroid   ? Insomnia   ? Lymphedema of both lower extremities   ? Migraine   ? Restless leg syndrome   ? Vitamin D deficiency   ? ? ?Family History  ?Problem Relation Age of Onset  ? Pulmonary embolism Mother   ?  Depression Mother   ? Asthma Mother   ? Heart attack Father   ? Cancer Father   ? ? ?Social History  ? ?Socioeconomic History  ? Marital status: Married  ?  Spouse name: Not on file  ? Number of children: Not on file  ? Years of education: Not on file  ? Highest education level: Not on file  ?Occupational History  ? Not on file  ?Tobacco Use  ? Smoking status: Never  ? Smokeless tobacco: Never  ?Vaping Use  ? Vaping Use: Never used  ?Substance and Sexual Activity  ? Alcohol use: Yes  ?  Alcohol/week: 0.0 standard drinks  ?  Comment: Rare  ? Drug use: No  ? Sexual activity: Not on file  ?Other Topics Concern  ? Not on file  ?Social History Narrative  ? Not on file  ? ?Social Determinants of Health  ? ?Financial Resource Strain: Not on file  ?Food Insecurity: Not on file  ?Transportation Needs: Not on file  ?Physical Activity: Not on file  ?Stress: Not on file  ?Social Connections: Not on file  ?Intimate Partner Violence: Not on file  ? ? ?Past Medical History, Surgical history, Social history, and Family history were reviewed and updated as appropriate.  ? ?Please see review of systems for  further details on the patient's review from today.  ? ?Objective:  ? ?Physical Exam:  ?There were no vitals taken for this visit. ? ?Physical Exam ?Neurological:  ?   Mental Status: She is alert and oriente

## 2021-12-07 ENCOUNTER — Other Ambulatory Visit: Payer: Self-pay

## 2021-12-07 ENCOUNTER — Telehealth: Payer: Self-pay

## 2021-12-07 NOTE — Telephone Encounter (Signed)
Pt reported to Janett Billow she is unable to get Dayvigo and Armodafinil 150 mg. Tried to do a prior authorization on Dayvigo but unable to due to being one on file. Unable to access and will contact [employer] for both medications. ?

## 2021-12-10 ENCOUNTER — Other Ambulatory Visit: Payer: Self-pay

## 2021-12-13 ENCOUNTER — Other Ambulatory Visit: Payer: Self-pay

## 2021-12-26 ENCOUNTER — Other Ambulatory Visit: Payer: Self-pay

## 2021-12-26 ENCOUNTER — Other Ambulatory Visit: Payer: Self-pay | Admitting: Family Medicine

## 2021-12-26 DIAGNOSIS — F1190 Opioid use, unspecified, uncomplicated: Secondary | ICD-10-CM

## 2021-12-26 DIAGNOSIS — M961 Postlaminectomy syndrome, not elsewhere classified: Secondary | ICD-10-CM

## 2021-12-27 ENCOUNTER — Encounter: Payer: Self-pay | Admitting: Oncology

## 2021-12-27 ENCOUNTER — Other Ambulatory Visit: Payer: Self-pay

## 2021-12-27 MED FILL — Hydrocodone-Acetaminophen Tab 10-325 MG: ORAL | 30 days supply | Qty: 90 | Fill #0 | Status: AC

## 2022-01-01 ENCOUNTER — Other Ambulatory Visit: Payer: Self-pay

## 2022-01-04 ENCOUNTER — Other Ambulatory Visit: Payer: Self-pay

## 2022-01-11 ENCOUNTER — Other Ambulatory Visit: Payer: Self-pay

## 2022-01-14 ENCOUNTER — Other Ambulatory Visit: Payer: Self-pay

## 2022-01-14 MED ORDER — CLINDAMYCIN HCL 150 MG PO CAPS
ORAL_CAPSULE | ORAL | 0 refills | Status: DC
  Filled 2022-01-14: qty 28, 5d supply, fill #0

## 2022-01-14 MED ORDER — TRAMADOL-ACETAMINOPHEN 37.5-325 MG PO TABS
ORAL_TABLET | ORAL | 0 refills | Status: DC
  Filled 2022-01-14: qty 30, 5d supply, fill #0

## 2022-01-15 ENCOUNTER — Other Ambulatory Visit: Payer: Self-pay

## 2022-01-17 ENCOUNTER — Other Ambulatory Visit (HOSPITAL_BASED_OUTPATIENT_CLINIC_OR_DEPARTMENT_OTHER): Payer: Self-pay

## 2022-01-21 ENCOUNTER — Other Ambulatory Visit: Payer: Self-pay

## 2022-01-21 MED ORDER — CHLORHEXIDINE GLUCONATE 0.12 % MT SOLN
OROMUCOSAL | 3 refills | Status: DC
Start: 2022-01-21 — End: 2022-12-04
  Filled 2022-01-21: qty 473, 16d supply, fill #0
  Filled 2022-03-12 – 2022-10-21 (×3): qty 473, 16d supply, fill #1

## 2022-01-21 MED ORDER — OXYCODONE-ACETAMINOPHEN 5-325 MG PO TABS
1.0000 | ORAL_TABLET | Freq: Four times a day (QID) | ORAL | 0 refills | Status: DC | PRN
  Filled 2022-01-21: qty 9, 2d supply, fill #0

## 2022-01-23 ENCOUNTER — Other Ambulatory Visit: Payer: Self-pay | Admitting: Family Medicine

## 2022-01-23 ENCOUNTER — Other Ambulatory Visit: Payer: Self-pay

## 2022-01-23 DIAGNOSIS — F1190 Opioid use, unspecified, uncomplicated: Secondary | ICD-10-CM

## 2022-01-23 DIAGNOSIS — M961 Postlaminectomy syndrome, not elsewhere classified: Secondary | ICD-10-CM

## 2022-01-24 ENCOUNTER — Other Ambulatory Visit: Payer: Self-pay

## 2022-01-24 MED ORDER — HYDROCODONE-ACETAMINOPHEN 10-325 MG PO TABS
1.0000 | ORAL_TABLET | Freq: Three times a day (TID) | ORAL | 0 refills | Status: DC | PRN
  Filled 2022-01-24: qty 90, 30d supply, fill #0

## 2022-01-24 MED ORDER — OXYCODONE-ACETAMINOPHEN 5-325 MG PO TABS
1.0000 | ORAL_TABLET | Freq: Four times a day (QID) | ORAL | 0 refills | Status: DC | PRN
  Filled 2022-01-24: qty 5, 2d supply, fill #0

## 2022-02-10 ENCOUNTER — Other Ambulatory Visit: Payer: Self-pay | Admitting: Family Medicine

## 2022-02-10 DIAGNOSIS — M961 Postlaminectomy syndrome, not elsewhere classified: Secondary | ICD-10-CM

## 2022-02-10 DIAGNOSIS — I82411 Acute embolism and thrombosis of right femoral vein: Secondary | ICD-10-CM

## 2022-02-10 DIAGNOSIS — F1190 Opioid use, unspecified, uncomplicated: Secondary | ICD-10-CM

## 2022-02-11 ENCOUNTER — Other Ambulatory Visit: Payer: Self-pay

## 2022-02-11 MED ORDER — HYDROCODONE-ACETAMINOPHEN 10-325 MG PO TABS
1.0000 | ORAL_TABLET | Freq: Three times a day (TID) | ORAL | 0 refills | Status: DC | PRN
  Filled 2022-02-11 – 2022-02-21 (×3): qty 90, 30d supply, fill #0

## 2022-02-18 ENCOUNTER — Other Ambulatory Visit: Payer: Self-pay

## 2022-02-19 ENCOUNTER — Other Ambulatory Visit: Payer: Self-pay

## 2022-02-21 ENCOUNTER — Other Ambulatory Visit: Payer: Self-pay

## 2022-03-05 ENCOUNTER — Other Ambulatory Visit: Payer: Self-pay | Admitting: Family Medicine

## 2022-03-05 DIAGNOSIS — F1190 Opioid use, unspecified, uncomplicated: Secondary | ICD-10-CM

## 2022-03-05 DIAGNOSIS — M961 Postlaminectomy syndrome, not elsewhere classified: Secondary | ICD-10-CM

## 2022-03-06 ENCOUNTER — Other Ambulatory Visit: Payer: Self-pay

## 2022-03-09 ENCOUNTER — Other Ambulatory Visit: Payer: Self-pay | Admitting: Family Medicine

## 2022-03-09 DIAGNOSIS — F1190 Opioid use, unspecified, uncomplicated: Secondary | ICD-10-CM

## 2022-03-09 DIAGNOSIS — M961 Postlaminectomy syndrome, not elsewhere classified: Secondary | ICD-10-CM

## 2022-03-10 ENCOUNTER — Other Ambulatory Visit: Payer: Self-pay

## 2022-03-12 ENCOUNTER — Encounter: Payer: Self-pay | Admitting: Oncology

## 2022-03-12 ENCOUNTER — Other Ambulatory Visit: Payer: Self-pay

## 2022-03-14 ENCOUNTER — Other Ambulatory Visit: Payer: Self-pay

## 2022-03-14 ENCOUNTER — Encounter: Payer: Self-pay | Admitting: Oncology

## 2022-03-20 ENCOUNTER — Other Ambulatory Visit: Payer: Self-pay

## 2022-03-20 ENCOUNTER — Other Ambulatory Visit: Payer: Self-pay | Admitting: Family Medicine

## 2022-03-20 ENCOUNTER — Encounter: Payer: Self-pay | Admitting: Oncology

## 2022-03-20 DIAGNOSIS — F1190 Opioid use, unspecified, uncomplicated: Secondary | ICD-10-CM

## 2022-03-20 DIAGNOSIS — M961 Postlaminectomy syndrome, not elsewhere classified: Secondary | ICD-10-CM

## 2022-03-20 MED ORDER — HYDROCODONE-ACETAMINOPHEN 10-325 MG PO TABS
1.0000 | ORAL_TABLET | Freq: Three times a day (TID) | ORAL | 0 refills | Status: DC | PRN
  Filled 2022-03-20 – 2022-03-21 (×3): qty 90, 30d supply, fill #0

## 2022-03-21 ENCOUNTER — Encounter: Payer: Self-pay | Admitting: Oncology

## 2022-03-21 ENCOUNTER — Other Ambulatory Visit: Payer: Self-pay

## 2022-03-22 ENCOUNTER — Other Ambulatory Visit: Payer: Self-pay

## 2022-03-22 ENCOUNTER — Encounter: Payer: Self-pay | Admitting: Oncology

## 2022-04-04 ENCOUNTER — Encounter: Payer: Self-pay | Admitting: Oncology

## 2022-04-04 ENCOUNTER — Other Ambulatory Visit: Payer: Self-pay

## 2022-04-04 ENCOUNTER — Other Ambulatory Visit: Payer: Self-pay | Admitting: Family Medicine

## 2022-04-04 DIAGNOSIS — I82411 Acute embolism and thrombosis of right femoral vein: Secondary | ICD-10-CM

## 2022-04-04 MED ORDER — APIXABAN 5 MG PO TABS
5.0000 mg | ORAL_TABLET | Freq: Two times a day (BID) | ORAL | 0 refills | Status: DC
  Filled 2022-04-04: qty 60, 30d supply, fill #0

## 2022-04-09 ENCOUNTER — Other Ambulatory Visit: Payer: Self-pay

## 2022-04-11 ENCOUNTER — Other Ambulatory Visit: Payer: Self-pay

## 2022-04-16 ENCOUNTER — Other Ambulatory Visit: Payer: Self-pay | Admitting: Family Medicine

## 2022-04-16 DIAGNOSIS — F1190 Opioid use, unspecified, uncomplicated: Secondary | ICD-10-CM

## 2022-04-16 DIAGNOSIS — M961 Postlaminectomy syndrome, not elsewhere classified: Secondary | ICD-10-CM

## 2022-04-17 NOTE — Telephone Encounter (Signed)
Patient is due for in-office visit and labs. Needs to schedule appt before refill can be approved.

## 2022-04-18 NOTE — Telephone Encounter (Signed)
She is due for in-office (not virtual) follow up appt and labs. This needs to be scheduled sometime within the next month before we can approve refill.

## 2022-04-18 NOTE — Telephone Encounter (Signed)
Called, left vm to call back to schedule OV.

## 2022-04-19 ENCOUNTER — Other Ambulatory Visit: Payer: Self-pay

## 2022-04-19 MED ORDER — PROMETHAZINE HCL 25 MG PO TABS
25.0000 mg | ORAL_TABLET | Freq: Three times a day (TID) | ORAL | 2 refills | Status: DC | PRN
  Filled 2022-04-19: qty 30, 10d supply, fill #0
  Filled 2022-06-05: qty 30, 10d supply, fill #1
  Filled 2022-08-05: qty 30, 10d supply, fill #2

## 2022-04-19 MED ORDER — HYDROCODONE-ACETAMINOPHEN 10-325 MG PO TABS
1.0000 | ORAL_TABLET | Freq: Three times a day (TID) | ORAL | 0 refills | Status: DC | PRN
  Filled 2022-04-19: qty 30, 10d supply, fill #0

## 2022-04-19 NOTE — Telephone Encounter (Signed)
Pt returned the call scheduled for 04/22/2022. Pt stated she really needs her medication and is asking if it's possible to get a courtesy refill just enough for the weekend for her to see Dr.Fisher on Monday. Pt stated she cannot be without her medication and has no medication left.  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   Please advise pt is requesting a call back today.

## 2022-04-22 ENCOUNTER — Ambulatory Visit: Payer: Self-pay | Admitting: Family Medicine

## 2022-04-22 ENCOUNTER — Encounter: Payer: Self-pay | Admitting: Oncology

## 2022-04-22 ENCOUNTER — Ambulatory Visit (INDEPENDENT_AMBULATORY_CARE_PROVIDER_SITE_OTHER): Payer: BC Managed Care – PPO | Admitting: Family Medicine

## 2022-04-22 ENCOUNTER — Encounter: Payer: Self-pay | Admitting: Family Medicine

## 2022-04-22 ENCOUNTER — Telehealth: Payer: Self-pay | Admitting: Family Medicine

## 2022-04-22 VITALS — BP 102/76 | HR 66 | Resp 16 | Ht 65.0 in | Wt 240.0 lb

## 2022-04-22 DIAGNOSIS — G894 Chronic pain syndrome: Secondary | ICD-10-CM | POA: Diagnosis not present

## 2022-04-22 DIAGNOSIS — M5417 Radiculopathy, lumbosacral region: Secondary | ICD-10-CM

## 2022-04-22 DIAGNOSIS — Z23 Encounter for immunization: Secondary | ICD-10-CM | POA: Diagnosis not present

## 2022-04-22 DIAGNOSIS — I890 Lymphedema, not elsewhere classified: Secondary | ICD-10-CM | POA: Diagnosis not present

## 2022-04-22 DIAGNOSIS — M961 Postlaminectomy syndrome, not elsewhere classified: Secondary | ICD-10-CM

## 2022-04-22 NOTE — Progress Notes (Signed)
medi

## 2022-04-22 NOTE — Progress Notes (Signed)
   SUBJECTIVE:   CHIEF COMPLAINT / HPI:   Leg pain - was previously seen at [facility], since taken over by PCP - 2/2 lymphedema, lumbosacral radiculopathy and lumbar postlaminectomy syndrome. 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  - now having worse leg pain due to prolonged standing.  - also raising 46yo autistic nephew who is very active - wanting to know if ok to take additional norco pill in evening if needed.  - previously tried gabapentin, lyrica. Didn't tolerate.   OBJECTIVE:   BP 102/76 (BP Location: Left Arm, Patient Position: Sitting, Cuff Size: Normal)   Pulse 66   Resp 16   Ht 5\' 5"  (1.651 m)   Wt 240 lb (108.9 kg)   SpO2 98%   BMI 39.94 kg/m   Gen: well appearing, in NAD Card: Reg rate Lungs: Comfortable WOB on RA Ext: WWP, no edema   ASSESSMENT/PLAN:   Leg pain Exacerbated by work conditions. Ok to take additional norco pill in evenings as needed. Unable to take gabapentin, lyrica due to adverse event. Unable to take NSAIDs due to gastric bypass.  Letter provided for work accommodations.  F/u prn.    Myles Gip, DO

## 2022-04-22 NOTE — Patient Instructions (Signed)
It was great to see you!  Our plans for today:  - Make an appointment soon to follow up with Dr. Grayland Ormond. - You can take an extra norco in the evenings as needed until your work scheduled is sorted out.  - We are checking some labs today, we will release these results to your MyChart.  Take care and seek immediate care sooner if you develop any concerns.   Dr. Ky Barban

## 2022-04-29 ENCOUNTER — Telehealth: Payer: Self-pay | Admitting: Family Medicine

## 2022-04-29 DIAGNOSIS — M961 Postlaminectomy syndrome, not elsewhere classified: Secondary | ICD-10-CM

## 2022-04-29 DIAGNOSIS — F1190 Opioid use, unspecified, uncomplicated: Secondary | ICD-10-CM

## 2022-05-01 ENCOUNTER — Other Ambulatory Visit: Payer: Self-pay

## 2022-05-01 MED ORDER — HYDROCODONE-ACETAMINOPHEN 10-325 MG PO TABS
1.0000 | ORAL_TABLET | Freq: Three times a day (TID) | ORAL | 0 refills | Status: DC | PRN
  Filled 2022-05-01: qty 30, 10d supply, fill #0

## 2022-05-09 ENCOUNTER — Encounter: Payer: Self-pay | Admitting: Oncology

## 2022-05-09 ENCOUNTER — Other Ambulatory Visit: Payer: Self-pay

## 2022-05-09 NOTE — Telephone Encounter (Signed)
Pts husband Ovid Curd is calling to check on the status of the medication. Pt will run out tomorrow.

## 2022-05-10 ENCOUNTER — Other Ambulatory Visit: Payer: Self-pay

## 2022-05-10 ENCOUNTER — Other Ambulatory Visit: Payer: Self-pay | Admitting: Family Medicine

## 2022-05-10 ENCOUNTER — Telehealth: Payer: Self-pay | Admitting: Family Medicine

## 2022-05-10 ENCOUNTER — Encounter: Payer: Self-pay | Admitting: Oncology

## 2022-05-10 DIAGNOSIS — F1190 Opioid use, unspecified, uncomplicated: Secondary | ICD-10-CM

## 2022-05-10 DIAGNOSIS — M961 Postlaminectomy syndrome, not elsewhere classified: Secondary | ICD-10-CM

## 2022-05-10 MED ORDER — HYDROCODONE-ACETAMINOPHEN 10-325 MG PO TABS
1.0000 | ORAL_TABLET | Freq: Three times a day (TID) | ORAL | 0 refills | Status: DC | PRN
  Filled 2022-05-10: qty 30, 10d supply, fill #0

## 2022-05-10 NOTE — Telephone Encounter (Signed)
Medication Refill - Medication: HYDROcodone-acetaminophen (NORCO) 10-325 MG tablet  Pt husband called in and stated pt saw Dr.Rumball and she was okay with changing up to 4 times a day because pt is doing a lot of standing and has a lot of leg and back pain (chronic pain).  Pt has no medication left at this time.  Has the patient contacted their pharmacy? Yes.    (Agent: If yes, when and what did the pharmacy advise?)  Preferred Pharmacy (with phone number or street name):  [facility]  [address] [place] [address]  Phone: [phone] Fax: [phone]  Hours: M-F 7:30a-5:30p   Has the patient been seen for an appointment in the last year OR does the patient have an upcoming appointment? Yes.    Agent: Please be advised that RX refills may take up to 3 business days. We ask that you follow-up with your pharmacy.

## 2022-05-10 NOTE — Telephone Encounter (Signed)
Called, left vm to call back. 

## 2022-05-14 ENCOUNTER — Other Ambulatory Visit: Payer: Self-pay

## 2022-05-14 ENCOUNTER — Telehealth: Payer: Self-pay | Admitting: Oncology

## 2022-05-14 MED ORDER — HYDROCODONE-ACETAMINOPHEN 10-325 MG PO TABS
1.0000 | ORAL_TABLET | Freq: Four times a day (QID) | ORAL | 0 refills | Status: DC | PRN
  Filled 2022-05-14 – 2022-05-20 (×2): qty 100, 25d supply, fill #0

## 2022-05-14 NOTE — Telephone Encounter (Signed)
Pt called and stated she missed appts in Feb.she believes her iron is low. She is getting labs done at [facility] tomorrow for Dr. Caryn Section. Pt wants to know if she can get orders from [place] to take with her there. If not, she can come in Friday afternoon for labs.

## 2022-05-14 NOTE — Telephone Encounter (Signed)
Looks like this was last refilled by Daneil Dan, not me.  Have sent in prescription for UP to 4 times a day, but she should not take the fourth tablet every day, just occasionally. She needs to schedule a follow up with me in 2 months.

## 2022-05-14 NOTE — Telephone Encounter (Signed)
Form filled out and ready to be picked up. Patient has been informed and will come by to pick it up in the morning.

## 2022-05-14 NOTE — Telephone Encounter (Signed)
Patient has appointment scheduled for tomorrow with Dr. Caryn Section

## 2022-05-15 ENCOUNTER — Encounter: Payer: Self-pay | Admitting: Family Medicine

## 2022-05-15 ENCOUNTER — Ambulatory Visit (INDEPENDENT_AMBULATORY_CARE_PROVIDER_SITE_OTHER): Payer: BC Managed Care – PPO | Admitting: Family Medicine

## 2022-05-15 ENCOUNTER — Other Ambulatory Visit: Payer: Self-pay | Admitting: Family Medicine

## 2022-05-15 ENCOUNTER — Other Ambulatory Visit: Payer: Self-pay

## 2022-05-15 VITALS — BP 119/79 | HR 81 | Temp 98.4°F | Resp 16 | Wt 240.0 lb

## 2022-05-15 DIAGNOSIS — E7800 Pure hypercholesterolemia, unspecified: Secondary | ICD-10-CM

## 2022-05-15 DIAGNOSIS — E559 Vitamin D deficiency, unspecified: Secondary | ICD-10-CM | POA: Diagnosis not present

## 2022-05-15 DIAGNOSIS — G894 Chronic pain syndrome: Secondary | ICD-10-CM | POA: Diagnosis not present

## 2022-05-15 DIAGNOSIS — D649 Anemia, unspecified: Secondary | ICD-10-CM | POA: Diagnosis not present

## 2022-05-15 DIAGNOSIS — E538 Deficiency of other specified B group vitamins: Secondary | ICD-10-CM

## 2022-05-15 DIAGNOSIS — D509 Iron deficiency anemia, unspecified: Secondary | ICD-10-CM

## 2022-05-15 DIAGNOSIS — G2581 Restless legs syndrome: Secondary | ICD-10-CM | POA: Diagnosis not present

## 2022-05-15 DIAGNOSIS — F1190 Opioid use, unspecified, uncomplicated: Secondary | ICD-10-CM

## 2022-05-15 DIAGNOSIS — M961 Postlaminectomy syndrome, not elsewhere classified: Secondary | ICD-10-CM | POA: Diagnosis not present

## 2022-05-15 DIAGNOSIS — E039 Hypothyroidism, unspecified: Secondary | ICD-10-CM

## 2022-05-15 MED ORDER — DULOXETINE HCL 30 MG PO CPEP
30.0000 mg | ORAL_CAPSULE | Freq: Every day | ORAL | 2 refills | Status: DC
  Filled 2022-05-15: qty 30, 30d supply, fill #0

## 2022-05-15 NOTE — Progress Notes (Signed)
I,Roshena L Chambers,acting as a scribe for [name], MD.,have documented all relevant documentation on the behalf of [name], MD,as directed by  [name], MD while in the presence of [name], MD.    Established patient visit   Patient: Michaela Jones   DOB: 1975-10-31   46 y.o. Female  MRN: [id number] Visit Date: 05/15/2022  Today's healthcare provider: [name], MD   Chief Complaint  Patient presents with   Pain Management   Subjective    HPI  Follow up for chronic pain:   The patient was last seen for this on 04/22/2022 (seen by Dr. Linwood Dibbles).   Changes made at last visit include advising patient that it was okay to take additional norco pill in evenings as needed due to exacerbation. Unable to take gabapentin, lyrica due to adverse event. Unable to take NSAIDs due to gastric bypass.  Letter was  provided for work accommodations.Patient was advised to follow up as needed.   She reports good compliance with treatment. She feels that condition is Improved. 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   She is not having side effects.   -----------------------------------------------------------------------------------------   She also continues on Xarelto for recurrent DVT. She reports she will be due for follow up with hematology (Dr. Orlie Dakin) next month.   Medications: Outpatient Medications Prior to Visit  Medication Sig   albuterol (PROVENTIL HFA;VENTOLIN HFA) 108 (90 Base) MCG/ACT inhaler Inhale 2 puffs into the lungs every 6 (six) hours as needed for wheezing or shortness of breath.   albuterol (PROVENTIL) (5 MG/ML) 0.5% nebulizer solution Inhale into the lungs.   albuterol (VENTOLIN HFA) 108 (90 Base) MCG/ACT inhaler Inhale 2 puffs into the lungs every 6 (six) hours as needed for wheezing or shortness of breath.   ALPRAZolam  (XANAX) 1 MG tablet TAKE 1/2 TO 1 TABLET BY MOUTH THREE TIMES DAILY AS NEEDED FOR ANXIETY   apixaban (ELIQUIS) 5 MG TABS tablet Take 1 tablet (5 mg total) by mouth 2 (two) times daily.   cetirizine (ZYRTEC) 10 MG tablet Take 10 mg by mouth daily as needed for allergies.   chlorhexidine (PERIDEX) 0.12 % solution RINSE MOUTH WITH (1 CAPFUL) FOR 30 SECONDS AM AND PM AFTER TOOTHBRUSHING. EXPECTORATE AFTER RINSING, DO NOT SWALLOW   Cholecalciferol (VITAMIN D) 50 MCG (2000 UT) CAPS Take by mouth.   cyanocobalamin (,VITAMIN B-12,) 1000 MCG/ML injection INJECT 1 MILLILITER (ML) EVERY TWO WEEKS FOR THREE MONTHS THEN ONCE A  MONTH   fluticasone (FLONASE) 50 MCG/ACT nasal spray Place 2 sprays into both nostrils daily.   HYDROcodone-acetaminophen (NORCO) 10-325 MG tablet Take 1 tablet by mouth 4 (four) times daily as needed for severe pain.   Insulin Pen Needle (NOVOFINE PEN NEEDLE) 32G X 6 MM MISC Use daily with saxenda.   Lemborexant (DAYVIGO) 10 MG TABS TAKE 1 TABLET BY MOUTH AT BEDTIME.   levothyroxine (SYNTHROID) 125 MCG tablet Take 1 tablet (125 mcg total) by mouth daily. Please schedule an office visit before anymore refills.   lidocaine (XYLOCAINE) 2 % solution Use as directed 15 mLs in the mouth or throat as needed for mouth pain.   Multiple Vitamin (MULTIVITAMIN) tablet Take 1 tablet by mouth daily.   promethazine (PHENERGAN) 25 MG tablet Take 1 tablet (25 mg total) by mouth every 8 (eight) hours as needed for nausea or  vomiting.   trimethoprim-polymyxin b (POLYTRIM) ophthalmic solution Place 1 drop into both eyes every 4 (four) hours. X 5 days   Armodafinil 150 MG tablet TAKE 1 TABLET BY MOUTH EVERY MORNING   No facility-administered medications prior to visit.    Review of Systems  Constitutional:  Negative for appetite change, chills, fatigue and fever.  Respiratory:  Negative for chest tightness and shortness of breath.   Cardiovascular:  Positive for leg swelling. Negative for chest  pain and palpitations.  Gastrointestinal:  Negative for abdominal pain, nausea and vomiting.  Musculoskeletal:  Positive for arthralgias and myalgias.  Neurological:  Negative for dizziness and weakness.       Objective    BP 119/79 (BP Location: Left Arm, Patient Position: Sitting, Cuff Size: Large)   Pulse 81   Temp 98.4 F (36.9 C) (Oral)   Resp 16   Wt 240 lb (108.9 kg)   SpO2 97% Comment: room air  BMI 39.94 kg/m    Physical Exam   General: Appearance:    Obese female in no acute distress  Eyes:    PERRL, conjunctiva/corneas clear, EOM's intact       Lungs:     Clear to auscultation bilaterally, respirations unlabored  Heart:    Normal heart rate. Normal rhythm. No murmurs, rubs, or gallops.    MS:   All extremities are intact.    Neurologic:   Awake, alert, oriented x 3. No apparent focal neurological defect.         Assessment & Plan     1. Chronic, continuous use of opioids  - Pain [facility]gt [facility] (14 Drugs), Ur  2. Chronic pain disorder Has been requiring more hydrocodone/apap this year due to having to be on her feet more for work. She has been escalating dose slowly but consistently since transferring pain management here from pain clinic.   Will continue current dose of hydrocodone/apap and add DULoxetine (CYMBALTA) 30 MG capsule; Take 1 capsule (30 mg total) by mouth daily.  Dispense: 30 capsule; Refill: 2   3. Lumbar postlaminectomy syndrome   4. Elevated LDL cholesterol level  - CBC - Comprehensive metabolic panel - Lipid panel  5. Adult hypothyroidism  - T4, free - TSH  6. Restless leg Likely related to iron deficiency   7. Iron deficiency anemia, unspecified iron deficiency anemia type  - Iron, TIBC and Ferritin Panel  8. Vitamin D deficiency  - VITAMIN D 25 Hydroxy (Vit-D Deficiency, Fractures)  9. B12 deficiency Continues B12 injections       The entirety of the information documented in the History of Present Illness, Review of  Systems and Physical Exam were personally obtained by me. Portions of this information were initially documented by the CMA and reviewed by me for thoroughness and accuracy.     Lelon Huh, MD  [facility] [phone] (phone) [phone] (fax)  [place]

## 2022-05-15 NOTE — Telephone Encounter (Signed)
[facility] faxed refill request for the following medications:  albuterol (VENTOLIN HFA) 108 (90 Base) MCG/ACT inhaler   Please advise.

## 2022-05-16 ENCOUNTER — Other Ambulatory Visit: Payer: Self-pay

## 2022-05-16 LAB — IRON,TIBC AND FERRITIN PANEL
Ferritin: 58 ng/mL (ref 15–150)
Iron Saturation: 16 % (ref 15–55)
Iron: 51 ug/dL (ref 27–159)
Total Iron Binding Capacity: 311 ug/dL (ref 250–450)
UIBC: 260 ug/dL (ref 131–425)

## 2022-05-16 LAB — PAIN MGT SCRN (14 DRUGS), UR
Amphetamine Scrn, Ur: NEGATIVE ng/mL
BARBITURATE SCREEN URINE: NEGATIVE ng/mL
BENZODIAZEPINE SCREEN, URINE: POSITIVE ng/mL — AB
Buprenorphine, Urine: NEGATIVE ng/mL
CANNABINOIDS UR QL SCN: NEGATIVE ng/mL
Cocaine (Metab) Scrn, Ur: NEGATIVE ng/mL
Creatinine(Crt), U: 49.7 mg/dL (ref 20.0–300.0)
Fentanyl, Urine: NEGATIVE pg/mL
Meperidine Screen, Urine: NEGATIVE ng/mL
Methadone Screen, Urine: NEGATIVE ng/mL
OXYCODONE+OXYMORPHONE UR QL SCN: NEGATIVE ng/mL
Opiate Scrn, Ur: POSITIVE ng/mL — AB
Ph of Urine: 6 (ref 4.5–8.9)
Phencyclidine Qn, Ur: NEGATIVE ng/mL
Propoxyphene Scrn, Ur: NEGATIVE ng/mL
Tramadol Screen, Urine: NEGATIVE ng/mL

## 2022-05-16 LAB — LIPID PANEL
Chol/HDL Ratio: 3.1 ratio (ref 0.0–4.4)
Cholesterol, Total: 211 mg/dL — ABNORMAL HIGH (ref 100–199)
HDL: 69 mg/dL (ref >39)
LDL Chol Calc (NIH): 120 mg/dL — ABNORMAL HIGH (ref 0–99)
Triglycerides: 127 mg/dL (ref 0–149)
VLDL Cholesterol Cal: 22 mg/dL (ref 5–40)

## 2022-05-16 LAB — CBC
Hematocrit: 39.2 % (ref 34.0–46.6)
Hemoglobin: 13.1 g/dL (ref 11.1–15.9)
MCH: 33.2 pg — ABNORMAL HIGH (ref 26.6–33.0)
MCHC: 33.4 g/dL (ref 31.5–35.7)
MCV: 100 fL — ABNORMAL HIGH (ref 79–97)
Platelets: 222 10*3/uL (ref 150–450)
RBC: 3.94 x10E6/uL (ref 3.77–5.28)
RDW: 12.3 % (ref 11.7–15.4)
WBC: 7.3 10*3/uL (ref 3.4–10.8)

## 2022-05-16 LAB — COMPREHENSIVE METABOLIC PANEL
ALT: 14 IU/L (ref 0–32)
AST: 18 IU/L (ref 0–40)
Albumin/Globulin Ratio: 1.7 (ref 1.2–2.2)
Albumin: 3.9 g/dL (ref 3.9–4.9)
Alkaline Phosphatase: 64 IU/L (ref 44–121)
BUN/Creatinine Ratio: 16 (ref 9–23)
BUN: 10 mg/dL (ref 6–24)
Bilirubin Total: 0.2 mg/dL (ref 0.0–1.2)
CO2: 22 mmol/L (ref 20–29)
Calcium: 9.1 mg/dL (ref 8.7–10.2)
Chloride: 107 mmol/L — ABNORMAL HIGH (ref 96–106)
Creatinine, Ser: 0.63 mg/dL (ref 0.57–1.00)
Globulin, Total: 2.3 g/dL (ref 1.5–4.5)
Glucose: 87 mg/dL (ref 70–99)
Potassium: 4 mmol/L (ref 3.5–5.2)
Sodium: 144 mmol/L (ref 134–144)
Total Protein: 6.2 g/dL (ref 6.0–8.5)
eGFR: 111 mL/min/{1.73_m2} (ref >59)

## 2022-05-16 LAB — TSH: TSH: 5.74 u[IU]/mL — ABNORMAL HIGH (ref 0.450–4.500)

## 2022-05-16 LAB — VITAMIN D 25 HYDROXY (VIT D DEFICIENCY, FRACTURES): Vit D, 25-Hydroxy: 9 ng/mL — ABNORMAL LOW (ref 30.0–100.0)

## 2022-05-16 LAB — T4, FREE: Free T4: 0.8 ng/dL — ABNORMAL LOW (ref 0.82–1.77)

## 2022-05-16 MED ORDER — ALBUTEROL SULFATE HFA 108 (90 BASE) MCG/ACT IN AERS
2.0000 | INHALATION_SPRAY | Freq: Four times a day (QID) | RESPIRATORY_TRACT | 1 refills | Status: DC | PRN
  Filled 2022-05-16: qty 6.7, 25d supply, fill #0

## 2022-05-16 NOTE — Telephone Encounter (Signed)
Please advise refill? 

## 2022-05-20 ENCOUNTER — Encounter: Payer: Self-pay | Admitting: Oncology

## 2022-05-20 ENCOUNTER — Other Ambulatory Visit: Payer: Self-pay

## 2022-05-21 ENCOUNTER — Other Ambulatory Visit: Payer: Self-pay

## 2022-05-24 ENCOUNTER — Other Ambulatory Visit: Payer: Self-pay

## 2022-05-27 ENCOUNTER — Other Ambulatory Visit: Payer: Self-pay

## 2022-05-27 ENCOUNTER — Encounter: Payer: Self-pay | Admitting: Physician Assistant

## 2022-05-27 ENCOUNTER — Encounter: Payer: Self-pay | Admitting: Oncology

## 2022-05-27 ENCOUNTER — Telehealth: Payer: Self-pay | Admitting: Physician Assistant

## 2022-05-27 DIAGNOSIS — B9689 Other specified bacterial agents as the cause of diseases classified elsewhere: Secondary | ICD-10-CM

## 2022-05-27 DIAGNOSIS — E538 Deficiency of other specified B group vitamins: Secondary | ICD-10-CM | POA: Insufficient documentation

## 2022-05-27 DIAGNOSIS — U071 COVID-19: Secondary | ICD-10-CM

## 2022-05-27 DIAGNOSIS — J069 Acute upper respiratory infection, unspecified: Secondary | ICD-10-CM

## 2022-05-27 MED ORDER — DOXYCYCLINE HYCLATE 100 MG PO TABS
100.0000 mg | ORAL_TABLET | Freq: Two times a day (BID) | ORAL | 0 refills | Status: DC
  Filled 2022-05-27: qty 14, 7d supply, fill #0

## 2022-05-27 MED ORDER — PREDNISONE 10 MG PO TABS
ORAL_TABLET | ORAL | 0 refills | Status: AC
  Filled 2022-05-27: qty 21, 6d supply, fill #0

## 2022-05-27 NOTE — Patient Instructions (Signed)
Michaela Jones, thank you for joining Leeanne Rio, PA-C for today's virtual visit.  While this provider is not your primary care provider (PCP), if your PCP is located in our provider database this encounter information will be shared with them immediately following your visit.   [facility] account gives you access to today's visit and all your visits, tests, and labs performed at [facility] " click here if you don't have a [facility] account or go to [email or web address]  Consent: (Patient) Michaela Jones provided verbal consent for this virtual visit at the beginning of the encounter.  Current Medications:  Current Outpatient Medications:    albuterol (PROVENTIL) (5 MG/ML) 0.5% nebulizer solution, Inhale into the lungs., Disp: , Rfl:    albuterol (VENTOLIN HFA) 108 (90 Base) MCG/ACT inhaler, Inhale 2 puffs into the lungs every 6 (six) hours as needed for wheezing or shortness of breath., Disp: 8.5 g, Rfl: 0   albuterol (VENTOLIN HFA) 108 (90 Base) MCG/ACT inhaler, Inhale 2 puffs into the lungs every 6 (six) hours as needed for wheezing or shortness of breath., Disp: 6.7 g, Rfl: 1   ALPRAZolam (XANAX) 1 MG tablet, TAKE 1/2 TO 1 TABLET BY MOUTH THREE TIMES DAILY AS NEEDED FOR ANXIETY, Disp: 90 tablet, Rfl: 5   apixaban (ELIQUIS) 5 MG TABS tablet, Take 1 tablet (5 mg total) by mouth 2 (two) times daily., Disp: 60 tablet, Rfl: 0   Armodafinil 150 MG tablet, TAKE 1 TABLET BY MOUTH EVERY MORNING, Disp: 30 tablet, Rfl: 5   cetirizine (ZYRTEC) 10 MG tablet, Take 10 mg by mouth daily as needed for allergies., Disp: , Rfl:    chlorhexidine (PERIDEX) 0.12 % solution, RINSE MOUTH WITH 15ML (1 CAPFUL) FOR 30 SECONDS AM AND PM AFTER TOOTHBRUSHING. EXPECTORATE AFTER RINSING, DO NOT SWALLOW, Disp: 473 mL, Rfl: 3   Cholecalciferol (VITAMIN D) 50 MCG (2000 UT) CAPS, Take by mouth., Disp: , Rfl:    cyanocobalamin (,VITAMIN B-12,) 1000 MCG/ML injection, INJECT 1  MILLILITER (ML) EVERY TWO WEEKS FOR THREE MONTHS THEN ONCE A  MONTH, Disp: 6 mL, Rfl: 4   DULoxetine (CYMBALTA) 30 MG capsule, Take 1 capsule (30 mg total) by mouth daily., Disp: 30 capsule, Rfl: 2   fluticasone (FLONASE) 50 MCG/ACT nasal spray, Place 2 sprays into both nostrils daily., Disp: 16 g, Rfl: 6   HYDROcodone-acetaminophen (NORCO) 10-325 MG tablet, Take 1 tablet by mouth 4 (four) times daily as needed for severe pain., Disp: 100 tablet, Rfl: 0   Insulin Pen Needle (NOVOFINE PEN NEEDLE) 32G X 6 MM MISC, Use daily with saxenda., Disp: 100 each, Rfl: 0   Lemborexant (DAYVIGO) 10 MG TABS, TAKE 1 TABLET BY MOUTH AT BEDTIME., Disp: 30 tablet, Rfl: 5   levothyroxine (SYNTHROID) 125 MCG tablet, Take 1 tablet (125 mcg total) by mouth daily. Please schedule an office visit before anymore refills., Disp: 90 tablet, Rfl: 4   lidocaine (XYLOCAINE) 2 % solution, Use as directed 15 mLs in the mouth or throat as needed for mouth pain., Disp: 50 mL, Rfl: 0   Multiple Vitamin (MULTIVITAMIN) tablet, Take 1 tablet by mouth daily., Disp: , Rfl:    promethazine (PHENERGAN) 25 MG tablet, Take 1 tablet (25 mg total) by mouth every 8 (eight) hours as needed for nausea or vomiting., Disp: 30 tablet, Rfl: 2   trimethoprim-polymyxin b (POLYTRIM) ophthalmic solution, Place 1 drop into both eyes every 4 (four) hours. X 5 days, Disp: 10 mL, Rfl: 0  Medications ordered in this encounter:  No orders of the defined types were placed in this encounter.    *If you need refills on other medications prior to your next appointment, please contact your pharmacy*  Follow-Up: Call back or seek an in-person evaluation if the symptoms worsen or if the condition fails to improve as anticipated.  [facility] [phone]  Other Instructions Please keep well-hydrated and get plenty of rest. Okay to continue over-the-counter Mucinex and Tylenol as needed. Consider a saline nasal rinse. If you have a humidifier,  running in the bedroom at night. Salt water gargles and Chloraseptic spray can also be beneficial for any residual sore throat. Start the antibiotic and steroid as directed. If symptoms or not resolving, or you note any new or worsening symptoms despite treatment, please seek an in person evaluation   If you have been instructed to have an in-person evaluation today at a local Urgent Care facility, please use the link below. It will take you to a list of all of our available [facility], including address, phone number and hours of operation. Please do not delay care.  [facility]  If you or a family member do not have a primary care provider, use the link below to schedule a visit and establish care. When you choose a [facility] primary care physician or advanced practice provider, you gain a long-term partner in health. Find a Primary Care Provider  Learn more about [facility]'s in-office and virtual care options: [facility] - Get Care Now

## 2022-05-27 NOTE — Progress Notes (Signed)
Virtual Visit Consent   Michaela Jones, you are scheduled for a virtual visit with a [facility] provider today. Just as with appointments in the office, your consent must be obtained to participate. Your consent will be active for this visit and any virtual visit you may have with one of our providers in the next 365 days. If you have a MyChart account, a copy of this consent can be sent to you electronically.  As this is a virtual visit, video technology does not allow for your provider to perform a traditional examination. This may limit your provider's ability to fully assess your condition. If your provider identifies any concerns that need to be evaluated in person or the need to arrange testing (such as labs, EKG, etc.), we will make arrangements to do so. Although advances in technology are sophisticated, we cannot ensure that it will always work on either your end or our end. If the connection with a video visit is poor, the visit may have to be switched to a telephone visit. With either a video or telephone visit, we are not always able to ensure that we have a secure connection.  By engaging in this virtual visit, you consent to the provision of healthcare and authorize for your insurance to be billed (if applicable) for the services provided during this visit. Depending on your insurance coverage, you may receive a charge related to this service.  I need to obtain your verbal consent now. Are you willing to proceed with your visit today? Michaela Jones has provided verbal consent on 05/27/2022 for a virtual visit (video or telephone). Leeanne Rio, [place]  Date: 05/27/2022 3:43 PM  Virtual Visit via Video Note   I, Leeanne Rio, connected with  Michaela Jones  ([id number], [birth year]) on 05/27/22 at  3:30 PM EDT by a video-enabled telemedicine application and verified that I am speaking with the correct person using two identifiers.  Location: Patient: Virtual Visit Location  Patient: Home Provider: Virtual Visit Location Provider: Home Office   I discussed the limitations of evaluation and management by telemedicine and the availability of in person appointments. The patient expressed understanding and agreed to proceed.    History of Present Illness: Michaela Jones is a 46 y.o. who identifies as a female who was assigned female at birth, and is being seen today for URI symptoms starting last week.  Initially with scratchy throat and hoarseness which was significant over the weekend, but now the hoarseness is resolving.  Is now having intermittent fevers along with chest and head congestion for which she has been using over-the-counter Mucinex.  Denies chills, aches, chest pain or shortness of breath.  Notes as of today she has some swollen lymph nodes on her neck with right greater than left.  These are quite tender.  Notes her throat is red but denies any white patches or other exudate at the back of throat.  Denies any known exposures, but does work as a Pharmacist, hospital.  Has not tested for COVID at home.  HPI: HPI  Problems:  Patient Active Problem List   Diagnosis Date Noted   B12 deficiency 05/27/2022   DVT of deep femoral vein ([place]) 05/26/2020   Chronic pain disorder 05/16/2020   Chronic, continuous use of opioids 05/16/2020   Anxiety 06/17/2018   Lumbar postlaminectomy syndrome 10/23/2017   Lumbosacral radiculopathy 10/23/2017   Vitamin D deficiency 03/01/2015   Acquired spondylolisthesis 03/01/2015   Elevated LDL cholesterol level 03/01/2015  Chronic constipation 03/01/2015   Dysthymia 11/11/2013   Absolute anemia 10/15/2012   Gastric bypass status for obesity 10/15/2012   Insomnia 09/03/2007   Allergic rhinitis 06/15/2007   Iron deficiency anemia 01/22/2007   Restless leg 09/23/2006   Headache, migraine 08/27/2006   Acquired lymphedema 07/29/1998   Adult hypothyroidism 07/29/1992    Allergies:  Allergies  Allergen Reactions   Lyrica [Pregabalin]  Other (See Comments)    Patient reports neurological problem. Speech disturbances and unable to control motor skills   Gabapentin Swelling   Nsaids Other (See Comments)    Avoids due to gastric bypass Avoids due to gastric bypass   Azithromycin Other (See Comments)    Very low absorption due to bariatric surgery; often resulting in therapeutic failure.  Use alternative abx, such as doxycycline or clarithromycin.   Penicillins Rash   Medications:  Current Outpatient Medications:    doxycycline (VIBRA-TABS) 100 MG tablet, Take 1 tablet (100 mg total) by mouth 2 (two) times daily., Disp: 14 tablet, Rfl: 0   predniSONE (STERAPRED UNI-PAK 21 TAB) 10 MG (21) TBPK tablet, Take following package directions., Disp: 21 tablet, Rfl: 0   albuterol (PROVENTIL) (5 MG/ML) 0.5% nebulizer solution, Inhale into the lungs., Disp: , Rfl:    albuterol (VENTOLIN HFA) 108 (90 Base) MCG/ACT inhaler, Inhale 2 puffs into the lungs every 6 (six) hours as needed for wheezing or shortness of breath., Disp: 8.5 g, Rfl: 0   albuterol (VENTOLIN HFA) 108 (90 Base) MCG/ACT inhaler, Inhale 2 puffs into the lungs every 6 (six) hours as needed for wheezing or shortness of breath., Disp: 6.7 g, Rfl: 1   ALPRAZolam (XANAX) 1 MG tablet, TAKE 1/2 TO 1 TABLET BY MOUTH THREE TIMES DAILY AS NEEDED FOR ANXIETY, Disp: 90 tablet, Rfl: 5   apixaban (ELIQUIS) 5 MG TABS tablet, Take 1 tablet (5 mg total) by mouth 2 (two) times daily., Disp: 60 tablet, Rfl: 0   Armodafinil 150 MG tablet, TAKE 1 TABLET BY MOUTH EVERY MORNING, Disp: 30 tablet, Rfl: 5   cetirizine (ZYRTEC) 10 MG tablet, Take 10 mg by mouth daily as needed for allergies., Disp: , Rfl:    chlorhexidine (PERIDEX) 0.12 % solution, RINSE MOUTH WITH 15ML (1 CAPFUL) FOR 30 SECONDS AM AND PM AFTER TOOTHBRUSHING. EXPECTORATE AFTER RINSING, DO NOT SWALLOW, Disp: 473 mL, Rfl: 3   Cholecalciferol (VITAMIN D) 50 MCG (2000 UT) CAPS, Take by mouth., Disp: , Rfl:    cyanocobalamin (,VITAMIN  B-12,) 1000 MCG/ML injection, INJECT 1 MILLILITER (ML) EVERY TWO WEEKS FOR THREE MONTHS THEN ONCE A  MONTH, Disp: 6 mL, Rfl: 4   DULoxetine (CYMBALTA) 30 MG capsule, Take 1 capsule (30 mg total) by mouth daily., Disp: 30 capsule, Rfl: 2   fluticasone (FLONASE) 50 MCG/ACT nasal spray, Place 2 sprays into both nostrils daily., Disp: 16 g, Rfl: 6   HYDROcodone-acetaminophen (NORCO) 10-325 MG tablet, Take 1 tablet by mouth 4 (four) times daily as needed for severe pain., Disp: 100 tablet, Rfl: 0   Insulin Pen Needle (NOVOFINE PEN NEEDLE) 32G X 6 MM MISC, Use daily with saxenda., Disp: 100 each, Rfl: 0   Lemborexant (DAYVIGO) 10 MG TABS, TAKE 1 TABLET BY MOUTH AT BEDTIME., Disp: 30 tablet, Rfl: 5   levothyroxine (SYNTHROID) 125 MCG tablet, Take 1 tablet (125 mcg total) by mouth daily. Please schedule an office visit before anymore refills., Disp: 90 tablet, Rfl: 4   Multiple Vitamin (MULTIVITAMIN) tablet, Take 1 tablet by mouth daily., Disp: , Rfl:  promethazine (PHENERGAN) 25 MG tablet, Take 1 tablet (25 mg total) by mouth every 8 (eight) hours as needed for nausea or vomiting., Disp: 30 tablet, Rfl: 2   trimethoprim-polymyxin b (POLYTRIM) ophthalmic solution, Place 1 drop into both eyes every 4 (four) hours. X 5 days, Disp: 10 mL, Rfl: 0  Observations/Objective: Patient is well-developed, well-nourished in no acute distress.  Resting comfortably at home.  Head is normocephalic, atraumatic.  No labored breathing. Speech is clear and coherent with logical content.  Patient is alert and oriented at baseline.   Assessment and Plan: 1. Bacterial URI - doxycycline (VIBRA-TABS) 100 MG tablet; Take 1 tablet (100 mg total) by mouth 2 (two) times daily.  Dispense: 14 tablet; Refill: 0 - predniSONE (STERAPRED UNI-PAK 21 TAB) 10 MG (21) TBPK tablet; Take following package directions.  Dispense: 21 tablet; Refill: 0  Concern for bacterial URI giving initial improvement in symptoms but now worsening.  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  Supportive measures, OTC medications reviewed.  Patient with penicillin allergy.  Notes she has never ingested penicillin but had a substantial rash just with topical exposure of penicillin suspension (works at a pharmacy), and mother has anaphylactic reaction to penicillin so she keeps away from these.  Doxycycline and Sterapred per orders.  Strict in person precautions discussed with patient, who voiced understanding and agreement with the plan.  Follow Up Instructions: I discussed the assessment and treatment plan with the patient. The patient was provided an opportunity to ask questions and all were answered. The patient agreed with the plan and demonstrated an understanding of the instructions.  A copy of instructions were sent to the patient via MyChart unless otherwise noted below.   The patient was advised to call back or seek an in-person evaluation if the symptoms worsen or if the condition fails to improve as anticipated.  Time:  I spent 10 minutes with the patient via telehealth technology discussing the above problems/concerns.    Leeanne Rio, PA-C

## 2022-05-28 ENCOUNTER — Telehealth: Payer: Self-pay

## 2022-05-28 ENCOUNTER — Other Ambulatory Visit: Payer: Self-pay

## 2022-05-28 MED ORDER — BENZONATATE 100 MG PO CAPS
100.0000 mg | ORAL_CAPSULE | Freq: Three times a day (TID) | ORAL | 0 refills | Status: DC | PRN
  Filled 2022-05-28: qty 30, 10d supply, fill #0

## 2022-05-28 NOTE — Telephone Encounter (Signed)
Called and spoke with patient, she states that she had worse symptoms of cough, appetite, and diarrhea. Patient is taking prescribed and OTC medication for symptoms. Provided patient with care advise for my chart companion. Advice given:  If cough remains the same or better: continue to treat with over the counter medications.  Hard candy or cough drops and drinking warm fluids. Adults can also use honey 2 tsp (10 ML) at bedtime.  If cough is becoming worse even with the use of over the counter medications and patient is not able to sleep at night, cough becomes productive with sputum that maybe yellow or green in color, contact PCP. If appetite becomes worse:   encourage patient to drink fluids as tolerated, work their way up to bland solid food such as crackers, pretzels, soup, bread or applesauce and boiled starches.  If patient is unable to tolerate any foods or liquids, notify PCP. If diarrhea remains the same:  encourage patient to drink oral fluids and bland foods.  Avoid alcohol, spicy foods, caffeine or fatty foods that could make diarrhea worse.  Continue to monitor for signs of dehydration (increased thirst decreased urine output, yellow urine, dry skin, headache or dizziness).  Advise patient to try OTC medication (Imodium, kaopectate, Pepto-Bismol) as per manufacturer's instructions.

## 2022-05-28 NOTE — Addendum Note (Signed)
Addended by: Brunetta Jeans on: 05/28/2022 12:25 PM   Modules accepted: Orders

## 2022-05-31 ENCOUNTER — Other Ambulatory Visit: Payer: Self-pay | Admitting: Psychiatry

## 2022-05-31 ENCOUNTER — Other Ambulatory Visit: Payer: Self-pay

## 2022-05-31 ENCOUNTER — Telehealth: Payer: Self-pay

## 2022-05-31 DIAGNOSIS — F411 Generalized anxiety disorder: Secondary | ICD-10-CM

## 2022-05-31 DIAGNOSIS — F5101 Primary insomnia: Secondary | ICD-10-CM

## 2022-05-31 NOTE — Telephone Encounter (Signed)
Called patient in regards to [facility] [facility] on my chart, to offer care advise. No answer left VM to call community line with any concerns.

## 2022-05-31 NOTE — Telephone Encounter (Signed)
Copied from [facility] [phone]. Topic: General - Inquiry >> May 31, 2022  2:30 PM Michaela Jones wrote: Reason for CRM: pt called saying Tuesday she tested positive for covid  she did a virtual visit and was given  Medications but she still has a bad cough.  She called them and they told her to call her primary and see if they could give her another medication for cough besides the tessalon pearls  CB@  [place] out pt

## 2022-05-31 NOTE — Telephone Encounter (Signed)
Patient called in for refill on Xanax 1mg  states that she has enough until Monday. She also needs a PA for Dayvigo 10mg  Ph: [facility]

## 2022-06-03 ENCOUNTER — Telehealth: Payer: Self-pay

## 2022-06-03 ENCOUNTER — Encounter: Payer: Self-pay | Admitting: Oncology

## 2022-06-03 ENCOUNTER — Telehealth: Payer: BC Managed Care – PPO | Admitting: Physician Assistant

## 2022-06-03 ENCOUNTER — Other Ambulatory Visit: Payer: Self-pay

## 2022-06-03 DIAGNOSIS — U071 COVID-19: Secondary | ICD-10-CM | POA: Diagnosis not present

## 2022-06-03 DIAGNOSIS — A084 Viral intestinal infection, unspecified: Secondary | ICD-10-CM

## 2022-06-03 MED ORDER — PROMETHAZINE-DM 6.25-15 MG/5ML PO SYRP
5.0000 mL | ORAL_SOLUTION | Freq: Four times a day (QID) | ORAL | 0 refills | Status: DC | PRN
  Filled 2022-06-03: qty 118, 6d supply, fill #0

## 2022-06-03 NOTE — Progress Notes (Signed)
Virtual Visit Consent   Michaela Jones, you are scheduled for a virtual visit with a [facility] provider today. Just as with appointments in the office, your consent must be obtained to participate. Your consent will be active for this visit and any virtual visit you may have with one of our providers in the next 365 days. If you have a MyChart account, a copy of this consent can be sent to you electronically.  As this is a virtual visit, video technology does not allow for your provider to perform a traditional examination. This may limit your provider's ability to fully assess your condition. If your provider identifies any concerns that need to be evaluated in person or the need to arrange testing (such as labs, EKG, etc.), we will make arrangements to do so. Although advances in technology are sophisticated, we cannot ensure that it will always work on either your end or our end. If the connection with a video visit is poor, the visit may have to be switched to a telephone visit. With either a video or telephone visit, we are not always able to ensure that we have a secure connection.  By engaging in this virtual visit, you consent to the provision of healthcare and authorize for your insurance to be billed (if applicable) for the services provided during this visit. Depending on your insurance coverage, you may receive a charge related to this service.  I need to obtain your verbal consent now. Are you willing to proceed with your visit today? Michaela Jones has provided verbal consent on 06/03/2022 for a virtual visit (video or telephone). Mar Daring, PA-C  Date: 06/03/2022 8:20 AM  Virtual Visit via Video Note   I, Mar Daring, connected with  Michaela Jones  ([id number], [birth year]) on 06/03/22 at  8:00 AM EST by a video-enabled telemedicine application and verified that I am speaking with the correct person using two identifiers.  Location: Patient: Virtual Visit Location Patient:  Home Provider: Virtual Visit Location Provider: Home Office   I discussed the limitations of evaluation and management by telemedicine and the availability of in person appointments. The patient expressed understanding and agreed to proceed.    History of Present Illness: Michaela Jones is a 46 y.o. who identifies as a female who was assigned female at birth, and is being seen today for continued symptoms after testing positive for Covid 19 on 05/27/22.  HPI: URI  This is a new problem. The current episode started in the past 7 days (Symptoms started 05/24/22; tested positive for covid 19 on 05/27/22 and seen virtually same day; was exposed to stomach virus on 05/30/22). The problem has been gradually worsening. Maximum temperature: had fever on Friday or Saturday night again; after exposure to nephew vomiting on her Thursday. Associated symptoms include congestion, coughing, diarrhea and nausea. Pertinent negatives include no headaches, rhinorrhea, sinus pain or vomiting. Associated symptoms comments: No taste, fatigue. She has tried antihistamine, decongestant, increased fluids and sleep (doxycycline and prednisone 6 day taper, tessalon) for the symptoms. The treatment provided mild relief.     Problems:  Patient Active Problem List   Diagnosis Date Noted   B12 deficiency 05/27/2022   DVT of deep femoral vein ([place]) 05/26/2020   Chronic pain disorder 05/16/2020   Chronic, continuous use of opioids 05/16/2020   Anxiety 06/17/2018   Lumbar postlaminectomy syndrome 10/23/2017   Lumbosacral radiculopathy 10/23/2017   Vitamin D deficiency 03/01/2015   Acquired spondylolisthesis 03/01/2015  Elevated LDL cholesterol level 03/01/2015   Chronic constipation 03/01/2015   Dysthymia 11/11/2013   Absolute anemia 10/15/2012   Gastric bypass status for obesity 10/15/2012   Insomnia 09/03/2007   Allergic rhinitis 06/15/2007   Iron deficiency anemia 01/22/2007   Restless leg 09/23/2006   Headache,  migraine 08/27/2006   Acquired lymphedema 07/29/1998   Adult hypothyroidism 07/29/1992    Allergies:  Allergies  Allergen Reactions   Lyrica [Pregabalin] Other (See Comments)    Patient reports neurological problem. Speech disturbances and unable to control motor skills   Gabapentin Swelling   Nsaids Other (See Comments)    Avoids due to gastric bypass Avoids due to gastric bypass   Azithromycin Other (See Comments)    Very low absorption due to bariatric surgery; often resulting in therapeutic failure.  Use alternative abx, such as doxycycline or clarithromycin.   Penicillins Rash   Medications:  Current Outpatient Medications:    promethazine-dextromethorphan (PROMETHAZINE-DM) 6.25-15 MG/5ML syrup, Take 5 mLs by mouth 4 (four) times daily as needed., Disp: 118 mL, Rfl: 0   albuterol (PROVENTIL) (5 MG/ML) 0.5% nebulizer solution, Inhale into the lungs., Disp: , Rfl:    albuterol (VENTOLIN HFA) 108 (90 Base) MCG/ACT inhaler, Inhale 2 puffs into the lungs every 6 (six) hours as needed for wheezing or shortness of breath., Disp: 8.5 g, Rfl: 0   albuterol (VENTOLIN HFA) 108 (90 Base) MCG/ACT inhaler, Inhale 2 puffs into the lungs every 6 (six) hours as needed for wheezing or shortness of breath., Disp: 6.7 g, Rfl: 1   ALPRAZolam (XANAX) 1 MG tablet, TAKE 1/2 TO 1 TABLET BY MOUTH THREE TIMES DAILY AS NEEDED FOR ANXIETY, Disp: 90 tablet, Rfl: 5   apixaban (ELIQUIS) 5 MG TABS tablet, Take 1 tablet (5 mg total) by mouth 2 (two) times daily., Disp: 60 tablet, Rfl: 0   Armodafinil 150 MG tablet, TAKE 1 TABLET BY MOUTH EVERY MORNING, Disp: 30 tablet, Rfl: 5   benzonatate (TESSALON) 100 MG capsule, Take 1 capsule (100 mg total) by mouth 3 (three) times daily as needed for cough., Disp: 30 capsule, Rfl: 0   cetirizine (ZYRTEC) 10 MG tablet, Take 10 mg by mouth daily as needed for allergies., Disp: , Rfl:    chlorhexidine (PERIDEX) 0.12 % solution, RINSE MOUTH WITH 15ML (1 CAPFUL) FOR 30 SECONDS AM  AND PM AFTER TOOTHBRUSHING. EXPECTORATE AFTER RINSING, DO NOT SWALLOW, Disp: 473 mL, Rfl: 3   Cholecalciferol (VITAMIN D) 50 MCG (2000 UT) CAPS, Take by mouth., Disp: , Rfl:    cyanocobalamin (,VITAMIN B-12,) 1000 MCG/ML injection, INJECT 1 MILLILITER (ML) EVERY TWO WEEKS FOR THREE MONTHS THEN ONCE A  MONTH, Disp: 6 mL, Rfl: 4   doxycycline (VIBRA-TABS) 100 MG tablet, Take 1 tablet (100 mg total) by mouth 2 (two) times daily., Disp: 14 tablet, Rfl: 0   DULoxetine (CYMBALTA) 30 MG capsule, Take 1 capsule (30 mg total) by mouth daily., Disp: 30 capsule, Rfl: 2   fluticasone (FLONASE) 50 MCG/ACT nasal spray, Place 2 sprays into both nostrils daily., Disp: 16 g, Rfl: 6   HYDROcodone-acetaminophen (NORCO) 10-325 MG tablet, Take 1 tablet by mouth 4 (four) times daily as needed for severe pain., Disp: 100 tablet, Rfl: 0   Insulin Pen Needle (NOVOFINE PEN NEEDLE) 32G X 6 MM MISC, Use daily with saxenda., Disp: 100 each, Rfl: 0   Lemborexant (DAYVIGO) 10 MG TABS, TAKE 1 TABLET BY MOUTH AT BEDTIME., Disp: 30 tablet, Rfl: 5   levothyroxine (SYNTHROID) 125 MCG tablet,  Take 1 tablet (125 mcg total) by mouth daily. Please schedule an office visit before anymore refills., Disp: 90 tablet, Rfl: 4   Multiple Vitamin (MULTIVITAMIN) tablet, Take 1 tablet by mouth daily., Disp: , Rfl:    promethazine (PHENERGAN) 25 MG tablet, Take 1 tablet (25 mg total) by mouth every 8 (eight) hours as needed for nausea or vomiting., Disp: 30 tablet, Rfl: 2   trimethoprim-polymyxin b (POLYTRIM) ophthalmic solution, Place 1 drop into both eyes every 4 (four) hours. X 5 days, Disp: 10 mL, Rfl: 0  Observations/Objective: Patient is well-developed, well-nourished in no acute distress.  Resting comfortably at home.  Head is normocephalic, atraumatic.  No labored breathing.  Speech is clear and coherent with logical content.  Patient is alert and oriented at baseline.    Assessment and Plan: 1. Viral gastroenteritis -  promethazine-dextromethorphan (PROMETHAZINE-DM) 6.25-15 MG/5ML syrup; Take 5 mLs by mouth 4 (four) times daily as needed.  Dispense: 118 mL; Refill: 0  2. COVID-19 - promethazine-dextromethorphan (PROMETHAZINE-DM) 6.25-15 MG/5ML syrup; Take 5 mLs by mouth 4 (four) times daily as needed.  Dispense: 118 mL; Refill: 0  - Suspect possible secondary viral gastroenteritis due to possible positive exposure and new symptoms consistent with this started after exposure - Discussed adding Imodium A-D for diarrhea - Promethazine DM cough syrup added for cough and nausea - Push fluids; electrolyte beverages - Work note updated - Seek in person evaluation if symptoms persist or worsen  Follow Up Instructions: I discussed the assessment and treatment plan with the patient. The patient was provided an opportunity to ask questions and all were answered. The patient agreed with the plan and demonstrated an understanding of the instructions.  A copy of instructions were sent to the patient via MyChart unless otherwise noted below.    The patient was advised to call back or seek an in-person evaluation if the symptoms worsen or if the condition fails to improve as anticipated.  Time:  I spent 15 minutes with the patient via telehealth technology discussing the above problems/concerns.    Mar Daring, PA-C

## 2022-06-03 NOTE — Patient Instructions (Signed)
Michaela Jones, thank you for joining [name], PA-C for today's virtual visit.  While this provider is not your primary care provider (PCP), if your PCP is located in our provider database this encounter information will be shared with them immediately following your visit.   A [facility] MyChart account gives you access to today's visit and all your visits, tests, and labs performed at [facility] " click here if you don't have a [facility] MyChart account or go to [email or web address]  Consent: (Patient) Michaela Jones provided verbal consent for this virtual visit at the beginning of the encounter.  Current Medications:  Current Outpatient Medications:    promethazine-dextromethorphan (PROMETHAZINE-DM) 6.25-15 MG/5ML syrup, Take 5 mLs by mouth 4 (four) times daily as needed., Disp: 118 mL, Rfl: 0   albuterol (PROVENTIL) (5 MG/ML) 0.5% nebulizer solution, Inhale into the lungs., Disp: , Rfl:    albuterol (VENTOLIN HFA) 108 (90 Base) MCG/ACT inhaler, Inhale 2 puffs into the lungs every 6 (six) hours as needed for wheezing or shortness of breath., Disp: 8.5 g, Rfl: 0   albuterol (VENTOLIN HFA) 108 (90 Base) MCG/ACT inhaler, Inhale 2 puffs into the lungs every 6 (six) hours as needed for wheezing or shortness of breath., Disp: 6.7 g, Rfl: 1   ALPRAZolam (XANAX) 1 MG tablet, TAKE 1/2 TO 1 TABLET BY MOUTH THREE TIMES DAILY AS NEEDED FOR ANXIETY, Disp: 90 tablet, Rfl: 5   apixaban (ELIQUIS) 5 MG TABS tablet, Take 1 tablet (5 mg total) by mouth 2 (two) times daily., Disp: 60 tablet, Rfl: 0   Armodafinil 150 MG tablet, TAKE 1 TABLET BY MOUTH EVERY MORNING, Disp: 30 tablet, Rfl: 5   benzonatate (TESSALON) 100 MG capsule, Take 1 capsule (100 mg total) by mouth 3 (three) times daily as needed for cough., Disp: 30 capsule, Rfl: 0   cetirizine (ZYRTEC) 10 MG tablet, Take 10 mg by mouth daily as needed for allergies., Disp: , Rfl:    chlorhexidine (PERIDEX) 0.12 % solution, RINSE  MOUTH WITH (1 CAPFUL) FOR 30 SECONDS AM AND PM AFTER TOOTHBRUSHING. EXPECTORATE AFTER RINSING, DO NOT SWALLOW, Disp: 473 mL, Rfl: 3   Cholecalciferol (VITAMIN D) 50 MCG (2000 UT) CAPS, Take by mouth., Disp: , Rfl:    cyanocobalamin (,VITAMIN B-12,) 1000 MCG/ML injection, INJECT 1 MILLILITER (ML) EVERY TWO WEEKS FOR THREE MONTHS THEN ONCE A  MONTH, Disp: 6 mL, Rfl: 4   doxycycline (VIBRA-TABS) 100 MG tablet, Take 1 tablet (100 mg total) by mouth 2 (two) times daily., Disp: 14 tablet, Rfl: 0   DULoxetine (CYMBALTA) 30 MG capsule, Take 1 capsule (30 mg total) by mouth daily., Disp: 30 capsule, Rfl: 2   fluticasone (FLONASE) 50 MCG/ACT nasal spray, Place 2 sprays into both nostrils daily., Disp: 16 g, Rfl: 6   HYDROcodone-acetaminophen (NORCO) 10-325 MG tablet, Take 1 tablet by mouth 4 (four) times daily as needed for severe pain., Disp: 100 tablet, Rfl: 0   Insulin Pen Needle (NOVOFINE PEN NEEDLE) 32G X 6 MM MISC, Use daily with saxenda., Disp: 100 each, Rfl: 0   Lemborexant (DAYVIGO) 10 MG TABS, TAKE 1 TABLET BY MOUTH AT BEDTIME., Disp: 30 tablet, Rfl: 5   levothyroxine (SYNTHROID) 125 MCG tablet, Take 1 tablet (125 mcg total) by mouth daily. Please schedule an office visit before anymore refills., Disp: 90 tablet, Rfl: 4   Multiple Vitamin (MULTIVITAMIN) tablet, Take 1 tablet by mouth daily., Disp: , Rfl:    promethazine (PHENERGAN) 25 MG tablet,  Take 1 tablet (25 mg total) by mouth every 8 (eight) hours as needed for nausea or vomiting., Disp: 30 tablet, Rfl: 2   trimethoprim-polymyxin b (POLYTRIM) ophthalmic solution, Place 1 drop into both eyes every 4 (four) hours. X 5 days, Disp: 10 mL, Rfl: 0   Medications ordered in this encounter:  Meds ordered this encounter  Medications   promethazine-dextromethorphan (PROMETHAZINE-DM) 6.25-15 MG/5ML syrup    Sig: Take 5 mLs by mouth 4 (four) times daily as needed.    Dispense:  118 mL    Refill:  0    Order Specific Question:   Supervising  Provider    Answer:   Chase Picket [[id number]]     *If you need refills on other medications prior to your next appointment, please contact your pharmacy*  Follow-Up: Call back or seek an in-person evaluation if the symptoms worsen or if the condition fails to improve as anticipated.  [place] [phone]  Other Instructions  Food Choices to Help Relieve Diarrhea, Adult Diarrhea can make you feel weak and cause you to become dehydrated. Dehydration is a condition in which there is not enough water or other fluids in the body. It is important to choose the right foods and drinks to: Relieve diarrhea. Replace lost fluids and nutrients. Prevent dehydration. What are tips for following this plan? Relieving diarrhea Avoid foods that make your diarrhea worse. These may include: Foods and drinks that are sweetened with high-fructose corn syrup, honey, or sweeteners such as xylitol, sorbitol, and mannitol. Check food labels for these ingredients. Fried, greasy, or spicy foods. Raw fruits and vegetables. Eat foods that are rich in probiotics. These include foods such as yogurt and fermented milk products. Probiotics can help increase healthy bacteria in your stomach and intestines (gastrointestinal or GI tract). This may help digestion and stop diarrhea. If you have lactose intolerance, avoid dairy products. These may make your diarrhea worse. Take medicine to help stop diarrhea only as told by your health care provider. Replacing nutrients  Eat bland, easy-to-digest foods in small amounts as you are able, until your diarrhea starts to get better. These foods include bananas, applesauce, rice, toast, and crackers. Over time, add nutrient-rich foods as your body tolerates them or as told by your health care provider. These include: Well-cooked protein foods, such as eggs, lean meats like fish or chicken without skin, and tofu. Peeled, seeded, and soft-cooked fruits and  vegetables. Low-fat dairy products. Whole grains. Take vitamin and mineral supplements as told by your health care provider. Preventing dehydration  Start by sipping water or a solution to prevent dehydration (oral rehydration solution, or ORS). This is a drink that helps replace fluids and minerals your body has lost. You can buy an ORS at pharmacies and retail stores. Try to drink at least 8-10 cups (2,000-2,500 mL) of fluid each day to help replace lost fluids. If your urine is pale yellow, you are getting enough fluids. You may drink other liquids in addition to water, such as fruit juice that you have added water to (diluted fruit juice) or low-calorie sports drinks, as tolerated or as told by your health care provider. Avoid drinks with caffeine, such as coffee, tea, or soft drinks. Avoid alcohol. This information is not intended to replace advice given to you by your health care provider. Make sure you discuss any questions you have with your health care provider. Document Revised: 01/01/2022 Document Reviewed: 01/01/2022 Elsevier Patient Education  [place].  If you have been instructed to have an in-person evaluation today at a local Urgent Care facility, please use the link below. It will take you to a list of all of our available [facility], including address, phone number and hours of operation. Please do not delay care.  [facility]  If you or a family member do not have a primary care provider, use the link below to schedule a visit and establish care. When you choose a [facility] primary care physician or advanced practice provider, you gain a long-term partner in health. Find a Primary Care Provider  Learn more about 's in-office and virtual care options: [facility]

## 2022-06-03 NOTE — Telephone Encounter (Signed)
Called patient in regards to [place]. She states that she made an E- visit today to address worse symptoms cough, appetite, and diarrhea. Provided care advise through mychart. Patient will follow care plan by E- provider.

## 2022-06-03 NOTE — Telephone Encounter (Signed)
Please advise 

## 2022-06-05 ENCOUNTER — Other Ambulatory Visit: Payer: Self-pay | Admitting: Family Medicine

## 2022-06-05 ENCOUNTER — Other Ambulatory Visit: Payer: Self-pay | Admitting: Psychiatry

## 2022-06-05 DIAGNOSIS — F5101 Primary insomnia: Secondary | ICD-10-CM

## 2022-06-05 DIAGNOSIS — M961 Postlaminectomy syndrome, not elsewhere classified: Secondary | ICD-10-CM

## 2022-06-05 DIAGNOSIS — F1190 Opioid use, unspecified, uncomplicated: Secondary | ICD-10-CM

## 2022-06-06 ENCOUNTER — Other Ambulatory Visit: Payer: Self-pay

## 2022-06-06 MED ORDER — DAYVIGO 10 MG PO TABS
1.0000 | ORAL_TABLET | Freq: Every day | ORAL | 0 refills | Status: DC
  Filled 2022-06-06: qty 30, 30d supply, fill #0

## 2022-06-06 MED FILL — Alprazolam Tab 1 MG: ORAL | 30 days supply | Qty: 90 | Fill #0 | Status: AC

## 2022-06-06 NOTE — Telephone Encounter (Signed)
Please call to schedule an appt  

## 2022-06-07 ENCOUNTER — Other Ambulatory Visit: Payer: Self-pay

## 2022-06-07 ENCOUNTER — Telehealth: Payer: Self-pay

## 2022-06-07 ENCOUNTER — Telehealth: Payer: Self-pay | Admitting: Psychiatry

## 2022-06-07 ENCOUNTER — Encounter: Payer: Self-pay | Admitting: Oncology

## 2022-06-07 DIAGNOSIS — F4323 Adjustment disorder with mixed anxiety and depressed mood: Secondary | ICD-10-CM

## 2022-06-07 MED ORDER — ARMODAFINIL 150 MG PO TABS
ORAL_TABLET | Freq: Every morning | ORAL | 0 refills | Status: DC
  Filled 2022-06-07: qty 30, 30d supply, fill #0

## 2022-06-07 MED ORDER — HYDROCODONE-ACETAMINOPHEN 10-325 MG PO TABS
1.0000 | ORAL_TABLET | Freq: Four times a day (QID) | ORAL | 0 refills | Status: DC | PRN
  Filled 2022-06-07 – 2022-06-13 (×2): qty 100, 25d supply, fill #0
  Filled ????-??-??: fill #0

## 2022-06-07 NOTE — Telephone Encounter (Signed)
Patient's insurance changed in September. She said she needs a new PA for Armodafinil. Rx was pended to [facility] today.

## 2022-06-07 NOTE — Telephone Encounter (Signed)
I just did her PA for [facility] and its approved. I will have to check diagnosis for Armodafinil to see if its covered. If not its out of pocket.

## 2022-06-07 NOTE — Telephone Encounter (Signed)
Pt called requesting RF for Armodafinil, Xanax and Dayvigo. PA needed for Dayvigo due to change in insurance. Apt 11/22 [facility].

## 2022-06-07 NOTE — Telephone Encounter (Signed)
Prior Authorization initiated with [employer] for DAYVIGO 10 MG, pending response.

## 2022-06-07 NOTE — Telephone Encounter (Signed)
Xanax and Dayvigo were sent yesterday. Pended Armodafinil. Notified Traci of need for new PA.

## 2022-06-07 NOTE — Telephone Encounter (Signed)
She does not have an diagnosis of narcolepsy, obstructive sleep apnea, or shift work sleep disorders. Pt is taking off label for depression, low energy, and concentration. Not sure how she has paid for in the past but unlikely will receive an approval but I can try to submit.

## 2022-06-07 NOTE — Telephone Encounter (Signed)
Prior Approval received effective 06/07/2022-06/06/2023 for Dayvigo 10 mg

## 2022-06-07 NOTE — Telephone Encounter (Signed)
LM 11/10 to call office to schedule follow up for medication refill/management.

## 2022-06-07 NOTE — Telephone Encounter (Signed)
Submitted PA for Armodafinil and it says no pa required. Not sure if pharmacy was having issues running it?

## 2022-06-13 ENCOUNTER — Encounter: Payer: Self-pay | Admitting: Oncology

## 2022-06-13 ENCOUNTER — Other Ambulatory Visit: Payer: Self-pay

## 2022-06-19 ENCOUNTER — Telehealth (INDEPENDENT_AMBULATORY_CARE_PROVIDER_SITE_OTHER): Payer: BC Managed Care – PPO | Admitting: Psychiatry

## 2022-06-19 ENCOUNTER — Encounter: Payer: Self-pay | Admitting: Psychiatry

## 2022-06-19 ENCOUNTER — Other Ambulatory Visit: Payer: Self-pay

## 2022-06-19 DIAGNOSIS — F4323 Adjustment disorder with mixed anxiety and depressed mood: Secondary | ICD-10-CM | POA: Diagnosis not present

## 2022-06-19 DIAGNOSIS — F5101 Primary insomnia: Secondary | ICD-10-CM

## 2022-06-19 DIAGNOSIS — F411 Generalized anxiety disorder: Secondary | ICD-10-CM

## 2022-06-19 MED ORDER — ARMODAFINIL 150 MG PO TABS
150.0000 mg | ORAL_TABLET | Freq: Every morning | ORAL | 5 refills | Status: DC
  Filled 2022-06-19: qty 30, fill #0
  Filled 2022-08-05 – 2022-10-21 (×9): qty 30, 30d supply, fill #0

## 2022-06-19 MED ORDER — ALPRAZOLAM 1 MG PO TABS
0.5000 mg | ORAL_TABLET | Freq: Three times a day (TID) | ORAL | 5 refills | Status: DC | PRN
  Filled 2022-06-19: qty 90, fill #0
  Filled 2022-07-08: qty 12, 4d supply, fill #0
  Filled 2022-07-08: qty 78, 26d supply, fill #0
  Filled 2022-08-05: qty 90, 30d supply, fill #1
  Filled 2022-08-29 – 2022-08-30 (×2): qty 90, 30d supply, fill #2
  Filled 2022-09-23 – 2022-10-01 (×3): qty 90, 30d supply, fill #3
  Filled 2022-10-26 – 2022-10-29 (×2): qty 90, 30d supply, fill #4
  Filled 2022-11-27: qty 90, 30d supply, fill #5
  Filled ????-??-??: fill #4

## 2022-06-19 MED ORDER — BELSOMRA 20 MG PO TABS
20.0000 mg | ORAL_TABLET | Freq: Every day | ORAL | 5 refills | Status: DC
  Filled 2022-06-19 – 2022-08-16 (×3): qty 30, 30d supply, fill #0
  Filled 2022-08-29 – 2022-09-14 (×3): qty 30, 30d supply, fill #1
  Filled 2022-10-21: qty 30, 30d supply, fill #2
  Filled 2022-11-17: qty 30, 30d supply, fill #3
  Filled 2022-12-16: qty 30, 30d supply, fill #4

## 2022-06-19 NOTE — Progress Notes (Signed)
Michaela Jones [id number] 12/14/1975 46 y.o.  Virtual Visit via Video Note  I connected with pt @ on 06/19/22 at 11:00 AM EST by a video enabled telemedicine application and verified that I am speaking with the correct person using two identifiers.   I discussed the limitations of evaluation and management by telemedicine and the availability of in person appointments. The patient expressed understanding and agreed to proceed.  I discussed the assessment and treatment plan with the patient. The patient was provided an opportunity to ask questions and all were answered. The patient agreed with the plan and demonstrated an understanding of the instructions.   The patient was advised to call back or seek an in-person evaluation if the symptoms worsen or if the condition fails to improve as anticipated.  I provided 52 minutes of non-face-to-face time during this encounter.  The patient was located at home.  The provider was located at home.   Michaela Jones, PMHNP   Subjective:   Patient ID:  Michaela Jones is a 46 y.o. (DOB 1976-05-09) female.  Chief Complaint:  Chief Complaint  Patient presents with   Anxiety    Anxiety     Michaela Jones presents for follow-up of anxiety, depression, insomnia, and ADHD. She reports that she has been "90% good" with some "anxiety moments." She had about 7 days off all summer. 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 She has no planning time and is working longer hours. She has not had as much time as she expected to work on completing [employer]. She is also continuing to care for nephew and assist with his  needs. She also had COVID and then a GI illness where she lost 22 lbs. She has applied for and been offered a new job and will start Dec 1st. She reports that her husband's schedule of working 12 hour shifts, one week on and one week off, has been challenging.   Denies depressed mood. She reports that her appetite has improved. She reports that she has had difficulty getting Dayvigo due to insurance issues (changes, PA's) and pharmacy ordering it. Oliva Bustard was helping her stay asleep. Energy has been low. Concentration is improved with Armodafinil. Denies SI.   Reconnected with some friends from the past and will be spending Thanksgiving with them. Son will graduate in June and daughter graduates next year.   Insurance will change soon.   PCP added Duloxetine to help with pain.   Alprazolam last filled 06/06/22. Armodafinil last filled 06/07/22.   Past Psychiatric Medication Trials: Cymbalta Sertraline Remeron Wellbutrin Buspar Nuvigil Provigil- increased activation Ritalin Concerta Adderall- Not as effective Strattera Klonopin Xanax Vistaril- Ineffective Ambien- Effective. Caused parasomnias. Lunesta Temazepam Trazodone-Ineffective Belsomra Dayvigo Doxepin Silenor Seroquel- wt gain Gabapentin- Stuttering Lyrica Trileptal- tingling in extremities  Review of Systems:  Review of Systems  Constitutional:  Positive for fatigue.  HENT:  Negative for congestion.   Musculoskeletal:  Negative for gait problem.  Psychiatric/Behavioral:         Please refer to HPI    Medications: I have reviewed the patient's current medications.  Current Outpatient Medications  Medication Sig  Dispense Refill   cholecalciferol (VITAMIN D3) 25 MCG (1000 UNIT) tablet Take by mouth.     cyanocobalamin (,VITAMIN B-12,) 1000 MCG/ML injection INJECT 1 MILLILITER (ML) EVERY TWO WEEKS FOR THREE MONTHS THEN ONCE A  MONTH 6 mL 4   DULoxetine (CYMBALTA) 30 MG capsule Take 1 capsule (30 mg total) by  mouth daily. 30 capsule 2   fluticasone (FLONASE) 50 MCG/ACT nasal spray Place 2 sprays into both nostrils daily. (Patient taking differently: Place 2 sprays into both nostrils daily as needed.) 16 g 6   HYDROcodone-acetaminophen (NORCO) 10-325 MG tablet Take 1 tablet by mouth 4 (four) times daily as needed for severe pain. 100 tablet 0   levothyroxine (SYNTHROID) 125 MCG tablet Take 1 tablet (125 mcg total) by mouth daily. Please schedule an office visit before anymore refills. 90 tablet 4   albuterol (PROVENTIL) (5 MG/ML) 0.5% nebulizer solution Inhale into the lungs.     albuterol (VENTOLIN HFA) 108 (90 Base) MCG/ACT inhaler Inhale 2 puffs into the lungs every 6 (six) hours as needed for wheezing or shortness of breath. 8.5 g 0   albuterol (VENTOLIN HFA) 108 (90 Base) MCG/ACT inhaler Inhale 2 puffs into the lungs every 6 (six) hours as needed for wheezing or shortness of breath. 6.7 g 1   [START ON 07/04/2022] ALPRAZolam (XANAX) 1 MG tablet Take 0.5-1 tablets (0.5-1 mg total) by mouth 3 (three) times daily as needed for anxiety. 90 tablet 5   apixaban (ELIQUIS) 5 MG TABS tablet Take 1 tablet (5 mg total) by mouth 2 (two) times daily. 60 tablet 0   [START ON 07/05/2022] Armodafinil 150 MG tablet Take 1 tablet (150 mg total) by mouth every morning. 30 tablet 5   benzonatate (TESSALON) 100 MG capsule Take 1 capsule (100 mg total) by mouth 3 (three) times daily as needed for cough. (Patient not taking: Reported on 06/19/2022) 30 capsule 0   cetirizine (ZYRTEC) 10 MG tablet Take 10 mg by mouth daily as needed for allergies.     chlorhexidine (PERIDEX) 0.12 % solution RINSE MOUTH WITH 15ML (1 CAPFUL) FOR 30 SECONDS AM AND PM AFTER TOOTHBRUSHING. EXPECTORATE AFTER RINSING, DO NOT SWALLOW 473 mL 3   doxycycline (VIBRA-TABS) 100 MG tablet Take 1 tablet (100 mg total) by mouth 2 (two) times daily. (Patient not taking: Reported on 06/19/2022) 14 tablet 0   Insulin Pen Needle (NOVOFINE PEN NEEDLE) 32G X 6 MM MISC  Use daily with saxenda. 100 each 0   Multiple Vitamin (MULTIVITAMIN) tablet Take 1 tablet by mouth daily.     promethazine (PHENERGAN) 25 MG tablet Take 1 tablet (25 mg total) by mouth every 8 (eight) hours as needed for nausea or vomiting. 30 tablet 2   promethazine-dextromethorphan (PROMETHAZINE-DM) 6.25-15 MG/5ML syrup Take 5 mLs by mouth 4 (four) times daily as needed. (Patient not taking: Reported on 06/19/2022) 118 mL 0   Suvorexant (BELSOMRA) 20 MG TABS Take 20 mg by mouth at bedtime. 30 tablet 5   trimethoprim-polymyxin b (POLYTRIM) ophthalmic solution Place 1 drop into both eyes every 4 (four) hours. X 5 days 10 mL 0   No current facility-administered medications for this visit.    Medication Side Effects: None  Allergies:  Allergies  Allergen Reactions   Lyrica [Pregabalin] Other (See Comments)    Patient reports neurological problem. Speech disturbances and unable to control motor skills   Gabapentin Swelling   Nsaids Other (See Comments)    Avoids due to gastric bypass Avoids due to  gastric bypass   Azithromycin Other (See Comments)    Very low absorption due to bariatric surgery; often resulting in therapeutic failure.  Use alternative abx, such as doxycycline or clarithromycin.   Penicillins Rash    Past Medical History:  Diagnosis Date   Anemia    Chronic back pain    Complication of anesthesia    HARD TO WAKE UP/OXYGEN LEVEL DROPPED AFTER BACK SURGERY X1   Constipation, chronic    Depression    DVT (deep venous thrombosis) (HCC)    Edema    Factor 5 Leiden mutation, heterozygous (HCC)    History of seasonal allergies    pt has inhalers for this if needed   Hypotension    Hypothyroid    Insomnia    Lymphedema of both lower extremities    Migraine    Restless leg syndrome    Vitamin D deficiency     Family History  Problem Relation Age of Onset   Pulmonary embolism Mother    Depression Mother    Asthma Mother    Heart attack Father    Cancer Father      Social History   Socioeconomic History   Marital status: Married    Spouse name: Not on file   Number of children: Not on file   Years of education: Not on file   Highest education level: Not on file  Occupational History   Not on file  Tobacco Use   Smoking status: Never   Smokeless tobacco: Never  Vaping Use   Vaping Use: Never used  Substance and Sexual Activity   Alcohol use: Yes    Alcohol/week: 0.0 standard drinks of alcohol    Comment: Rare   Drug use: No   Sexual activity: Not on file  Other Topics Concern   Not on file  Social History Narrative   Not on file   Social Determinants of Health   Financial Resource Strain: Not on file  Food Insecurity: Not on file  Transportation Needs: Not on file  Physical Activity: Not on file  Stress: Not on file  Social Connections: Not on file  Intimate Partner Violence: Not on file    Past Medical History, Surgical history, Social history, and Family history were reviewed and updated as appropriate.   Please see review of systems for further details on the patient's review from today.   Objective:   Physical Exam:  Wt 236 lb (107 kg)   BMI 39.27 kg/m   Physical Exam Neurological:     Mental Status: She is alert and oriented to person, place, and time.     Cranial Nerves: No dysarthria.  Psychiatric:        Attention and Perception: Attention and perception normal.        Mood and Affect: Mood is not depressed.        Speech: Speech normal.        Behavior: Behavior is cooperative.        Thought Content: Thought content normal. Thought content is not paranoid or delusional. Thought content does not include homicidal or suicidal ideation. Thought content does not include homicidal or suicidal plan.        Cognition and Memory: Cognition and memory normal.        Judgment: Judgment normal.     Comments: Insight intact Mood      Lab Review:     Component Value Date/Time   NA 144 05/15/2022 1031   NA  139 12/03/2012 1136   K 4.0 05/15/2022 1031   K 4.0 12/03/2012 1136   CL 107 (H) 05/15/2022 1031   CL 105 12/03/2012 1136   CO2 22 05/15/2022 1031   CO2 26 12/03/2012 1136   GLUCOSE 87 05/15/2022 1031   GLUCOSE 104 (H) 05/26/2020 1312   GLUCOSE 77 12/03/2012 1136   BUN 10 05/15/2022 1031   BUN 16 12/03/2012 1136   CREATININE 0.63 05/15/2022 1031   CREATININE 0.46 (L) 12/03/2012 1136   CALCIUM 9.1 05/15/2022 1031   CALCIUM 9.0 12/03/2012 1136   PROT 6.2 05/15/2022 1031   ALBUMIN 3.9 05/15/2022 1031   AST 18 05/15/2022 1031   ALT 14 05/15/2022 1031   ALKPHOS 64 05/15/2022 1031   BILITOT 0.2 05/15/2022 1031   GFRNONAA >60 05/26/2020 1312   GFRNONAA >60 12/03/2012 1136   GFRAA 102 04/07/2020 1130   GFRAA >60 12/03/2012 1136       Component Value Date/Time   WBC 7.3 05/15/2022 1031   WBC 8.9 06/19/2021 1348   RBC 3.94 05/15/2022 1031   RBC 4.11 06/19/2021 1348   HGB 13.1 05/15/2022 1031   HCT 39.2 05/15/2022 1031   PLT 222 05/15/2022 1031   MCV 100 (H) 05/15/2022 1031   MCV 101 (H) 06/15/2013 1042   MCH 33.2 (H) 05/15/2022 1031   MCH 33.1 06/19/2021 1348   MCHC 33.4 05/15/2022 1031   MCHC 32.2 06/19/2021 1348   RDW 12.3 05/15/2022 1031   RDW 14.5 06/15/2013 1042   LYMPHSABS 2.8 06/19/2021 1348   LYMPHSABS 2.5 04/07/2020 1130   LYMPHSABS 3.5 06/15/2013 1042   MONOABS 0.6 06/19/2021 1348   MONOABS 0.9 06/15/2013 1042   EOSABS 0.1 06/19/2021 1348   EOSABS 0.2 04/07/2020 1130   EOSABS 0.1 06/15/2013 1042   BASOSABS 0.0 06/19/2021 1348   BASOSABS 0.1 04/07/2020 1130   BASOSABS 0.1 06/15/2013 1042    No results found for: "POCLITH", "LITHIUM"   No results found for: "PHENYTOIN", "PHENOBARB", "VALPROATE", "CBMZ"   .res Assessment: Plan:   Pt seen for 50 minutes and time spent discussing treatment plan.  Discussed her concern about availability of Dayvigo and possible alternatives. Discussed potential benefits, risks, and and side effects of Belsomra for insomnia.   Patient agrees to trial of Belsomra.  Will start Belsomra 20 mg at bedtime for insomnia. Continue Armodafinil 150 mg daily for off-label indication of ADHD.  Continue Alprazolam prn anxiety.  Recommended increasing Vit D to 2000 IU due to Vit D level being low.  Pt to follow-up in 6 months or sooner if clinically indicated.  Requested pt call in 3 months to provide update and request additional scripts.  Patient advised to contact office with any questions, adverse effects, or acute worsening in signs and symptoms.   Afifa was seen today for anxiety.  Diagnoses and all orders for this visit:  Generalized anxiety disorder -     ALPRAZolam (XANAX) 1 MG tablet; Take 0.5-1 tablets (0.5-1 mg total) by mouth 3 (three) times daily as needed for anxiety.  Primary insomnia -     Suvorexant (BELSOMRA) 20 MG TABS; Take 20 mg by mouth at bedtime. -     ALPRAZolam (XANAX) 1 MG tablet; Take 0.5-1 tablets (0.5-1 mg total) by mouth 3 (three) times daily as needed for anxiety.  Adjustment disorder with mixed anxiety and depressed mood -     Armodafinil 150 MG tablet; Take 1 tablet (150 mg total) by mouth every morning.  Please see After Visit Summary for patient specific instructions.  Future Appointments  Date Time Provider Department Center  06/24/2022  3:40 PM Fisher, Demetrios Isaacs, MD [id number]    No orders of the defined types were placed in this encounter.     -------------------------------

## 2022-06-19 NOTE — Telephone Encounter (Signed)
I called and spoke with patient. She is starting a new job. Patient says her new job requires a TB test. Patient has a follow up appointment on 06/24/2022 with Dr. Sherrie Mustache. She wants to have the TB test ordered during that appointment along with any other labs that need to be checked. Patient is aware that Dr. Sherrie Mustache is out of the office until 06/24/2022. Patient plans to discuss this request during her appointment.

## 2022-06-19 NOTE — Telephone Encounter (Signed)
Copied from CRM [id number]. Topic: Appointment Scheduling - Scheduling Inquiry for Clinic >> Jun 19, 2022 10:33 AM Macon Large wrote: Reason for CRM: Pt asked if she could have TB test done during her appt on 06/24/22. Pt requests call back to advise because she already has an appt scheduled for 07/03/22

## 2022-06-24 ENCOUNTER — Other Ambulatory Visit: Payer: Self-pay

## 2022-06-24 ENCOUNTER — Ambulatory Visit: Payer: BC Managed Care – PPO | Admitting: Family Medicine

## 2022-06-24 VITALS — BP 110/61 | HR 80 | Temp 97.9°F | Ht 66.0 in | Wt 239.1 lb

## 2022-06-24 DIAGNOSIS — E559 Vitamin D deficiency, unspecified: Secondary | ICD-10-CM

## 2022-06-24 DIAGNOSIS — Z111 Encounter for screening for respiratory tuberculosis: Secondary | ICD-10-CM

## 2022-06-24 DIAGNOSIS — E039 Hypothyroidism, unspecified: Secondary | ICD-10-CM

## 2022-06-24 DIAGNOSIS — G894 Chronic pain syndrome: Secondary | ICD-10-CM | POA: Diagnosis not present

## 2022-06-24 DIAGNOSIS — E538 Deficiency of other specified B group vitamins: Secondary | ICD-10-CM

## 2022-06-24 DIAGNOSIS — M5417 Radiculopathy, lumbosacral region: Secondary | ICD-10-CM

## 2022-06-24 DIAGNOSIS — Z0289 Encounter for other administrative examinations: Secondary | ICD-10-CM

## 2022-06-24 MED ORDER — LEVOTHYROXINE SODIUM 150 MCG PO TABS
150.0000 ug | ORAL_TABLET | Freq: Every day | ORAL | 1 refills | Status: DC
  Filled 2022-06-24: qty 90, 90d supply, fill #0
  Filled 2022-08-06 – 2022-08-29 (×2): qty 90, 90d supply, fill #1

## 2022-06-24 MED ORDER — DULOXETINE HCL 60 MG PO CPEP
60.0000 mg | ORAL_CAPSULE | Freq: Every day | ORAL | 2 refills | Status: DC
  Filled 2022-06-24: qty 30, 30d supply, fill #0
  Filled 2022-08-05: qty 30, 30d supply, fill #1
  Filled 2022-08-29: qty 30, 30d supply, fill #2

## 2022-06-24 NOTE — Progress Notes (Signed)
Established patient visit   Patient: Michaela Jones   DOB: Jul 04, 1976   46 y.o. Female  MRN: [id number] Visit Date: 06/24/2022  Today's healthcare provider: [name], MD   Florencia Reasons as a scribe for [name], MD.,have documented all relevant documentation on the behalf of [name], MD,as directed by  [name], MD while in the presence of [name], MD.   Chief Complaint  Patient presents with   Hypothyroidism    1 month follow up   Subjective    HPI  Hypothyroid, follow-up  Lab Results  Component Value Date   TSH 5.740 (H) 05/15/2022   TSH 20.500 (H) 04/07/2020   TSH 3.550 05/26/2015   FREET4 0.80 (L) 05/15/2022   T4TOTAL 4.6 05/26/2015    Wt Readings from Last 3 Encounters:  06/24/22 239 lb 1.6 oz (108.5 kg)  05/15/22 240 lb (108.9 kg)  04/22/22 240 lb (108.9 kg)    She was last seen for hypothyroid 1 months ago.   She reports good compliance with treatment.she states had been taking levothyroxine consistently every day prior to having her blood drawn in August.   She is not having side effects.   Symptoms: Yes change in energy level No constipation  No diarrhea No heat / cold intolerance  No nervousness No palpitations  Yes weight changes    ----------------------------------------------------------------------------------------- Follow up for Vitamin D deficiency   The patient was last seen for this 1 months ago. Changes made at last visit include; labs checked showing-vitamin D was extremely low. Advised to start taking vitamin D3 2,000 units once every day.   She reports excellent compliance with treatment. She feels that condition is Unchanged. She is not having side effects.  Lab Results  Component Value Date   VD25OH 9.0 (L) 05/15/2022    -----------------------------------------------------------------------------------------   Medications: Outpatient Medications Prior to Visit  Medication Sig    albuterol (PROVENTIL) (5 MG/ML) 0.5% nebulizer solution Inhale into the lungs.   albuterol (VENTOLIN HFA) 108 (90 Base) MCG/ACT inhaler Inhale 2 puffs into the lungs every 6 (six) hours as needed for wheezing or shortness of breath.   albuterol (VENTOLIN HFA) 108 (90 Base) MCG/ACT inhaler Inhale 2 puffs into the lungs every 6 (six) hours as needed for wheezing or shortness of breath.   [START ON 07/04/2022] ALPRAZolam (XANAX) 1 MG tablet Take 0.5-1 tablets (0.5-1 mg total) by mouth 3 (three) times daily as needed for anxiety.   apixaban (ELIQUIS) 5 MG TABS tablet Take 1 tablet (5 mg total) by mouth 2 (two) times daily.   [START ON 07/05/2022] Armodafinil 150 MG tablet Take 1 tablet (150 mg total) by mouth every morning.   cetirizine (ZYRTEC) 10 MG tablet Take 10 mg by mouth daily as needed for allergies.   chlorhexidine (PERIDEX) 0.12 % solution RINSE MOUTH WITH (1 CAPFUL) FOR 30 SECONDS AM AND PM AFTER TOOTHBRUSHING. EXPECTORATE AFTER RINSING, DO NOT SWALLOW   cholecalciferol (VITAMIN D3) 25 MCG (1000 UNIT) tablet Take by mouth.   cyanocobalamin (,VITAMIN B-12,) 1000 MCG/ML injection INJECT 1 MILLILITER (ML) EVERY TWO WEEKS FOR THREE MONTHS THEN ONCE A  MONTH   fluticasone (FLONASE) 50 MCG/ACT nasal spray Place 2 sprays into both nostrils daily. (Patient taking differently: Place 2 sprays into both nostrils daily as needed.)   HYDROcodone-acetaminophen (NORCO) 10-325 MG tablet Take 1 tablet by mouth 4 (four) times daily as needed for severe pain.   Insulin Pen Needle (NOVOFINE PEN  NEEDLE) 32G X 6 MM MISC Use daily with saxenda.   Multiple Vitamin (MULTIVITAMIN) tablet Take 1 tablet by mouth daily.   promethazine (PHENERGAN) 25 MG tablet Take 1 tablet (25 mg total) by mouth every 8 (eight) hours as needed for nausea or vomiting.   Suvorexant (BELSOMRA) 20 MG TABS Take 20 mg by mouth at bedtime.   trimethoprim-polymyxin b (POLYTRIM) ophthalmic solution Place 1 drop into both eyes every 4 (four)  hours. X 5 days   [DISCONTINUED] benzonatate (TESSALON) 100 MG capsule Take 1 capsule (100 mg total) by mouth 3 (three) times daily as needed for cough.   [DISCONTINUED] doxycycline (VIBRA-TABS) 100 MG tablet Take 1 tablet (100 mg total) by mouth 2 (two) times daily. (Patient not taking: Reported on 06/19/2022)   [DISCONTINUED] DULoxetine (CYMBALTA) 30 MG capsule Take 1 capsule (30 mg total) by mouth daily.   [DISCONTINUED] levothyroxine (SYNTHROID) 125 MCG tablet Take 1 tablet (125 mcg total) by mouth daily. Please schedule an office visit before anymore refills.   [DISCONTINUED] promethazine-dextromethorphan (PROMETHAZINE-DM) 6.25-15 MG/5ML syrup Take 5 mLs by mouth 4 (four) times daily as needed. (Patient not taking: Reported on 06/19/2022)   No facility-administered medications prior to visit.    Review of Systems  Constitutional:  Negative for appetite change, chills, fatigue and fever.  Respiratory:  Negative for chest tightness and shortness of breath.   Cardiovascular:  Negative for chest pain and palpitations.  Gastrointestinal:  Negative for abdominal pain, nausea and vomiting.  Neurological:  Negative for dizziness and weakness.       Objective    BP 110/61 (BP Location: Left Arm, Patient Position: Sitting, Cuff Size: Normal)   Pulse 80   Temp 97.9 F (36.6 C) (Oral)   Ht 5\' 6"  (1.676 m)   Wt 239 lb 1.6 oz (108.5 kg)   SpO2 99% Comment: room air  BMI 38.59 kg/m     Vision Screening   Right eye Left eye Both eyes  Without correction     With correction 20/30 20/40 20/25   Comments: Patient saw all colors    Physical Exam   General: Appearance:    Mildly obese female in no acute distress  Eyes:    PERRL, conjunctiva/corneas clear, EOM's intact       Lungs:     Clear to auscultation bilaterally, respirations unlabored  Heart:    Normal heart rate. Normal rhythm. No murmurs, rubs, or gallops.    MS:   All extremities are intact.    Neurologic:   Awake, alert,  oriented x 3. No apparent focal neurological defect.         Assessment & Plan     1. Adult hypothyroidism Increase  levothyroxine (SYNTHROID) to 150 MCG tablet; Take 1 tablet (150 mcg total) by mouth daily. Please schedule an office visit before anymore refills.  Dispense: 90 tablet; Refill: 1  2. Vitamin D deficiency Is now taking 1000 units vitamin D consistently every day.  - VITAMIN D 25 Hydroxy (Vit-D Deficiency, Fractures)  3. Chronic pain disorder   4. Lumbosacral radiculopathy Tolerating initiation of duloxetine 30, although has not noticed any improvement in pain. Will increase to  DULoxetine (CYMBALTA) 60 MG capsule; Take 1 capsule (60 mg total) by mouth daily.  Dispense: 30 capsule; Refill: 2  5. B12 deficiency  - Vitamin B12  6. Screening-pulmonary TB  - QuantiFERON-TB Gold Plus  7. 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  The entirety of the information documented in the History of Present Illness, Review of Systems and Physical Exam were personally obtained by me. Portions of this information were initially documented by the CMA and reviewed by me for thoroughness and accuracy.     Lelon Huh, MD  [facility] [phone] (phone) [phone] (fax)  [place]

## 2022-06-26 LAB — QUANTIFERON-TB GOLD PLUS
QuantiFERON Mitogen Value: >10 IU/mL
QuantiFERON Nil Value: 0.11 IU/mL
QuantiFERON TB1 Ag Value: 0.12 IU/mL
QuantiFERON TB2 Ag Value: 0.15 IU/mL
QuantiFERON-TB Gold Plus: NEGATIVE

## 2022-06-26 LAB — VITAMIN B12: Vitamin B-12: 289 pg/mL (ref 232–1245)

## 2022-06-26 LAB — VITAMIN D 25 HYDROXY (VIT D DEFICIENCY, FRACTURES): Vit D, 25-Hydroxy: 14 ng/mL — ABNORMAL LOW (ref 30.0–100.0)

## 2022-07-03 ENCOUNTER — Ambulatory Visit: Payer: BC Managed Care – PPO

## 2022-07-08 ENCOUNTER — Other Ambulatory Visit: Payer: Self-pay

## 2022-07-08 ENCOUNTER — Encounter: Payer: Self-pay | Admitting: Oncology

## 2022-07-11 ENCOUNTER — Encounter: Payer: Self-pay | Admitting: Oncology

## 2022-07-12 ENCOUNTER — Other Ambulatory Visit: Payer: Self-pay

## 2022-07-12 ENCOUNTER — Other Ambulatory Visit: Payer: Self-pay | Admitting: Family Medicine

## 2022-07-12 DIAGNOSIS — F1190 Opioid use, unspecified, uncomplicated: Secondary | ICD-10-CM

## 2022-07-12 DIAGNOSIS — M961 Postlaminectomy syndrome, not elsewhere classified: Secondary | ICD-10-CM

## 2022-07-12 NOTE — Telephone Encounter (Signed)
Requested medications are due for refill today.  unsure  Requested medications are on the active medications list.  yes  Last refill. 06/07/2022 #100  0 rf  Future visit scheduled.   no  Notes to clinic.  Refill not delegated.    Requested Prescriptions  Pending Prescriptions Disp Refills   HYDROcodone-acetaminophen (NORCO) 10-325 MG tablet 100 tablet 0    Sig: Take 1 tablet by mouth 4 (four) times daily as needed for severe pain.     Not Delegated - Analgesics:  Opioid Agonist Combinations Failed - 07/12/2022  3:42 PM      Failed - This refill cannot be delegated      Failed - Urine Drug Screen completed in last 360 days      Passed - Valid encounter within last 3 months    Recent Outpatient Visits           2 weeks ago Adult hypothyroidism   [facility] Malva Limes, MD   1 month ago Chronic, continuous use of opioids   [facility] Malva Limes, MD   2 months ago Need for influenza vaccination   [facility] Caro Laroche, DO   7 months ago Vitamin D deficiency   [facility] Malva Limes, MD   1 year ago COVID-19   [facility] Sherrie Mustache, Demetrios Isaacs, MD

## 2022-07-12 NOTE — Telephone Encounter (Signed)
Requested medications are due for refill today.  yes  Requested medications are on the active medications list.  yes  Last refill. 06/07/2022 #100 0 rf  Future visit scheduled.   no  Notes to clinic.  Refill not delegated.    Requested Prescriptions  Pending Prescriptions Disp Refills   HYDROcodone-acetaminophen (NORCO) 10-325 MG tablet 100 tablet 0    Sig: Take 1 tablet by mouth 4 (four) times daily as needed for severe pain.     Not Delegated - Analgesics:  Opioid Agonist Combinations Failed - 07/12/2022  4:23 PM      Failed - This refill cannot be delegated      Failed - Urine Drug Screen completed in last 360 days      Passed - Valid encounter within last 3 months    Recent Outpatient Visits           2 weeks ago Adult hypothyroidism   [facility] Malva Limes, MD   1 month ago Chronic, continuous use of opioids   [facility] Malva Limes, MD   2 months ago Need for influenza vaccination   [facility] Caro Laroche, DO   7 months ago Vitamin D deficiency   [facility] Malva Limes, MD   1 year ago COVID-19   [facility] Sherrie Mustache, Demetrios Isaacs, MD

## 2022-07-12 NOTE — Telephone Encounter (Signed)
Requested medications are due for refill today.  unsure  Requested medications are on the active medications list.  yes  Last refill. 06/07/2022 #100 0 rf  Future visit scheduled.   no  Notes to clinic.  Refill not delegated    Requested Prescriptions  Pending Prescriptions Disp Refills   HYDROcodone-acetaminophen (NORCO) 10-325 MG tablet 100 tablet 0    Sig: Take 1 tablet by mouth 4 (four) times daily as needed for severe pain.     Not Delegated - Analgesics:  Opioid Agonist Combinations Failed - 07/12/2022  4:18 PM      Failed - This refill cannot be delegated      Failed - Urine Drug Screen completed in last 360 days      Passed - Valid encounter within last 3 months    Recent Outpatient Visits           2 weeks ago Adult hypothyroidism   [facility] Malva Limes, MD   1 month ago Chronic, continuous use of opioids   [facility] Malva Limes, MD   2 months ago Need for influenza vaccination   [facility] Caro Laroche, DO   7 months ago Vitamin D deficiency   [facility] Malva Limes, MD   1 year ago COVID-19   [facility] Sherrie Mustache, Demetrios Isaacs, MD

## 2022-07-12 NOTE — Telephone Encounter (Signed)
Pt is calling to check on the status of medication. She will run out tonight. Pt requested this medication from the pharmacy on Tuesday.  CB- [phone]

## 2022-07-13 ENCOUNTER — Encounter: Payer: Self-pay | Admitting: Oncology

## 2022-07-14 ENCOUNTER — Other Ambulatory Visit: Payer: Self-pay

## 2022-07-14 ENCOUNTER — Encounter: Payer: Self-pay | Admitting: Oncology

## 2022-07-14 MED FILL — Hydrocodone-Acetaminophen Tab 10-325 MG: ORAL | 25 days supply | Qty: 100 | Fill #0 | Status: AC

## 2022-07-15 ENCOUNTER — Other Ambulatory Visit: Payer: Self-pay

## 2022-08-05 ENCOUNTER — Other Ambulatory Visit: Payer: Self-pay | Admitting: Family Medicine

## 2022-08-05 DIAGNOSIS — M961 Postlaminectomy syndrome, not elsewhere classified: Secondary | ICD-10-CM

## 2022-08-05 DIAGNOSIS — F1190 Opioid use, unspecified, uncomplicated: Secondary | ICD-10-CM

## 2022-08-06 ENCOUNTER — Other Ambulatory Visit: Payer: Self-pay

## 2022-08-06 ENCOUNTER — Encounter: Payer: Self-pay | Admitting: Oncology

## 2022-08-06 ENCOUNTER — Telehealth: Payer: Self-pay

## 2022-08-06 ENCOUNTER — Other Ambulatory Visit: Payer: Self-pay | Admitting: Family Medicine

## 2022-08-06 DIAGNOSIS — M961 Postlaminectomy syndrome, not elsewhere classified: Secondary | ICD-10-CM

## 2022-08-06 DIAGNOSIS — F1190 Opioid use, unspecified, uncomplicated: Secondary | ICD-10-CM

## 2022-08-06 MED FILL — Hydrocodone-Acetaminophen Tab 10-325 MG: ORAL | 25 days supply | Qty: 100 | Fill #0 | Status: CN

## 2022-08-06 NOTE — Telephone Encounter (Signed)
Prior Authorization Belsomra 20MG  tablets [employer]

## 2022-08-07 ENCOUNTER — Encounter: Payer: Self-pay | Admitting: Oncology

## 2022-08-07 ENCOUNTER — Other Ambulatory Visit: Payer: Self-pay

## 2022-08-07 MED FILL — Hydrocodone-Acetaminophen Tab 10-325 MG: ORAL | 25 days supply | Qty: 100 | Fill #0 | Status: AC

## 2022-08-08 ENCOUNTER — Other Ambulatory Visit: Payer: Self-pay

## 2022-08-08 ENCOUNTER — Telehealth: Payer: Self-pay

## 2022-08-08 NOTE — Telephone Encounter (Signed)
Copied from [place] [id number]. Topic: General - Inquiry >> Aug 08, 2022 11:36 AM Penni Bombard wrote: Reason for CRM: Pt called saying she is calling a couple days early for her pain medication because she has had an infection in her tooth and has taken 4 pills a day for a couple of weeks.  She is having surgery on Tuesday to remove the tooth.  She said the dentist may prescribe a couple of days.    Please call after 3:45 pm  CB#  [phone]

## 2022-08-13 ENCOUNTER — Other Ambulatory Visit: Payer: Self-pay

## 2022-08-13 ENCOUNTER — Encounter: Payer: Self-pay | Admitting: Oncology

## 2022-08-13 MED ORDER — OXYCODONE-ACETAMINOPHEN 5-325 MG PO TABS
1.0000 | ORAL_TABLET | Freq: Four times a day (QID) | ORAL | 0 refills | Status: DC | PRN
  Filled 2022-08-13: qty 7, 2d supply, fill #0

## 2022-08-13 MED ORDER — CHLORHEXIDINE GLUCONATE 0.12 % MT SOLN
OROMUCOSAL | 3 refills | Status: DC
  Filled 2022-08-13: qty 473, 7d supply, fill #0

## 2022-08-16 ENCOUNTER — Other Ambulatory Visit: Payer: Self-pay

## 2022-08-16 MED ORDER — OXYCODONE-ACETAMINOPHEN 5-325 MG PO TABS
1.0000 | ORAL_TABLET | Freq: Four times a day (QID) | ORAL | 0 refills | Status: DC | PRN
  Filled 2022-08-16: qty 7, 2d supply, fill #0

## 2022-08-18 NOTE — Telephone Encounter (Signed)
Prior Approval received for Belsomra 20 mg tablets effective through 08/16/2023 with [employer]

## 2022-08-19 ENCOUNTER — Other Ambulatory Visit: Payer: Self-pay

## 2022-08-19 NOTE — Telephone Encounter (Signed)
LVM with info

## 2022-08-20 ENCOUNTER — Other Ambulatory Visit: Payer: Self-pay

## 2022-08-22 ENCOUNTER — Other Ambulatory Visit: Payer: Self-pay

## 2022-08-27 ENCOUNTER — Other Ambulatory Visit: Payer: Self-pay

## 2022-08-29 ENCOUNTER — Other Ambulatory Visit: Payer: Self-pay | Admitting: Family Medicine

## 2022-08-29 ENCOUNTER — Other Ambulatory Visit: Payer: Self-pay

## 2022-08-29 DIAGNOSIS — F1190 Opioid use, unspecified, uncomplicated: Secondary | ICD-10-CM

## 2022-08-29 DIAGNOSIS — M961 Postlaminectomy syndrome, not elsewhere classified: Secondary | ICD-10-CM

## 2022-08-29 NOTE — Telephone Encounter (Signed)
Requested medication (s) are due for refill today: yes  Requested medication (s) are on the active medication list: yes  Last refill:  multiple;le dates  Future visit scheduled: yes  Notes to clinic:  Unable to refill per protocol, cannot delegate.      Requested Prescriptions  Pending Prescriptions Disp Refills   promethazine (PHENERGAN) 25 MG tablet 30 tablet 2    Sig: Take 1 tablet (25 mg total) by mouth every 8 (eight) hours as needed for nausea or vomiting.     Not Delegated - Gastroenterology: Antiemetics Failed - 08/29/2022 11:16 AM      Failed - This refill cannot be delegated      Passed - Valid encounter within last 6 months    Recent Outpatient Visits           2 months ago Adult hypothyroidism   [place], Donald E, MD   3 months ago Chronic, continuous use of opioids   [facility] Birdie Sons, MD   4 months ago Need for influenza vaccination   [facility] Myles Gip, DO   9 months ago Vitamin D deficiency   [facility] Birdie Sons, MD   1 year ago [place], Donald E, MD               HYDROcodone-acetaminophen (NORCO) 10-325 MG tablet 100 tablet 0    Sig: Take 1 tablet by mouth 4 (four) times daily as needed for severe pain.     Not Delegated - Analgesics:  Opioid Agonist Combinations Failed - 08/29/2022 11:16 AM      Failed - This refill cannot be delegated      Failed - Urine Drug Screen completed in last 360 days      Passed - Valid encounter within last 3 months    Recent Outpatient Visits           2 months ago Adult hypothyroidism   [place], Donald E, MD   3 months ago Chronic, continuous use of opioids   [facility] Birdie Sons, MD   4 months ago Need for influenza vaccination   [facility] Myles Gip, DO   9 months ago Vitamin D deficiency   [facility] Birdie Sons, MD   1 year ago [place], Donald E, MD

## 2022-08-29 NOTE — Telephone Encounter (Signed)
Please review. last office visit 05/2022.  KP

## 2022-08-30 ENCOUNTER — Other Ambulatory Visit: Payer: Self-pay

## 2022-08-30 ENCOUNTER — Other Ambulatory Visit: Payer: Self-pay | Admitting: Family Medicine

## 2022-08-30 DIAGNOSIS — F1190 Opioid use, unspecified, uncomplicated: Secondary | ICD-10-CM

## 2022-08-30 DIAGNOSIS — M961 Postlaminectomy syndrome, not elsewhere classified: Secondary | ICD-10-CM

## 2022-08-30 NOTE — Telephone Encounter (Signed)
Requested medications are due for refill today.  Provider to determine  Requested medications are on the active medications list.  yes  Last refill. Norco 08/06/2022 #100, Phenergan 04/19/2022 #30 2 rf  Future visit scheduled.   no  Notes to clinic.  Refills not delegated.    Requested Prescriptions  Pending Prescriptions Disp Refills   HYDROcodone-acetaminophen (NORCO) 10-325 MG tablet 100 tablet 0    Sig: Take 1 tablet by mouth 4 (four) times daily as needed for severe pain.     Not Delegated - Analgesics:  Opioid Agonist Combinations Failed - 08/30/2022  5:26 PM      Failed - This refill cannot be delegated      Failed - Urine Drug Screen completed in last 360 days      Passed - Valid encounter within last 3 months    Recent Outpatient Visits           2 months ago Adult hypothyroidism   [place], Donald E, MD   3 months ago Chronic, continuous use of opioids   [facility] Birdie Sons, MD   4 months ago Need for influenza vaccination   [facility] Myles [facility]ip[facility] DO   9 months ago Vitamin D deficiency   [facility] Birdie Sons, MD   1 year ago [place], Donald E, MD               promethazine (PHENERGAN) 25 MG tablet 30 tablet 2    Sig: Take 1 tablet (25 mg total) by mouth every 8 (eight) hours as needed for nausea or vomiting.     Not Delegated - Gastroenterology: Antiemetics Failed - 08/30/2022  5:26 PM      Failed - This refill cannot be delegated      Passed - Valid encounter within last 6 months    Recent Outpatient Visits           2 months ago Adult hypothyroidism   [place], Donald E, MD   3 months ago Chronic, continuous use of opioids   [facility] Birdie Sons, MD   4 months ago Need for influenza vaccination    [facility] Myles Gip, DO   9 months ago Vitamin D deficiency   [facility] Birdie Sons, MD   1 year ago [place], Donald E, MD

## 2022-08-31 MED FILL — Hydrocodone-Acetaminophen Tab 10-325 MG: ORAL | 25 days supply | Qty: 100 | Fill #0 | Status: CN

## 2022-08-31 MED FILL — Promethazine HCl Tab 25 MG: ORAL | 10 days supply | Qty: 30 | Fill #0 | Status: AC

## 2022-09-01 ENCOUNTER — Encounter: Payer: Self-pay | Admitting: Oncology

## 2022-09-01 ENCOUNTER — Other Ambulatory Visit: Payer: Self-pay

## 2022-09-02 ENCOUNTER — Other Ambulatory Visit: Payer: Self-pay

## 2022-09-02 ENCOUNTER — Encounter: Payer: Self-pay | Admitting: Oncology

## 2022-09-02 ENCOUNTER — Telehealth: Payer: Self-pay | Admitting: Family Medicine

## 2022-09-02 MED FILL — Hydrocodone-Acetaminophen Tab 10-325 MG: ORAL | 25 days supply | Qty: 100 | Fill #0 | Status: AC

## 2022-09-02 NOTE — Telephone Encounter (Signed)
Rx was sent in on 08/31/22. LVM advising. Ok for [facility] to advise if patient returns call.

## 2022-09-02 NOTE — Telephone Encounter (Signed)
Pt called about refill for HYDROcodone-acetaminophen (NORCO) 10-325 MG tablet [[id number]]  There is no class on the refill / pt just wants to make sure it was sent to pharmacy on 08/31/22 and she will have no issue picking it up this afternoon / please confirm RX was sent to pharmacy

## 2022-09-03 ENCOUNTER — Other Ambulatory Visit: Payer: Self-pay

## 2022-09-06 IMAGING — US US EXTREM LOW VENOUS*R*
1 series · 13 of 24 positions shown · non-contrast
Comparison: None.

CLINICAL DATA: 44-year-old female with pain in the right interpolar
and lower thigh.

EXAM:
RIGHT LOWER EXTREMITY VENOUS DOPPLER ULTRASOUND
TECHNIQUE: Gray-scale sonography with graded compression, as well as color
Doppler and duplex ultrasound were performed to evaluate the right
lower extremity deep venous systems from the level of the common
femoral vein and including the common femoral, femoral, profunda
femoral, popliteal and calf veins including the posterior tibial,
peroneal and gastrocnemius veins when visible. Spectral Doppler was
utilized to evaluate flow at rest and with distal augmentation
maneuvers in the common femoral, femoral and popliteal veins. The
contralateral common femoral vein was also evaluated for comparison.

[Series 1: us extrem low venous*right* · 0.09mm/px · 13 of 40 slices shown]
[im 1/40]
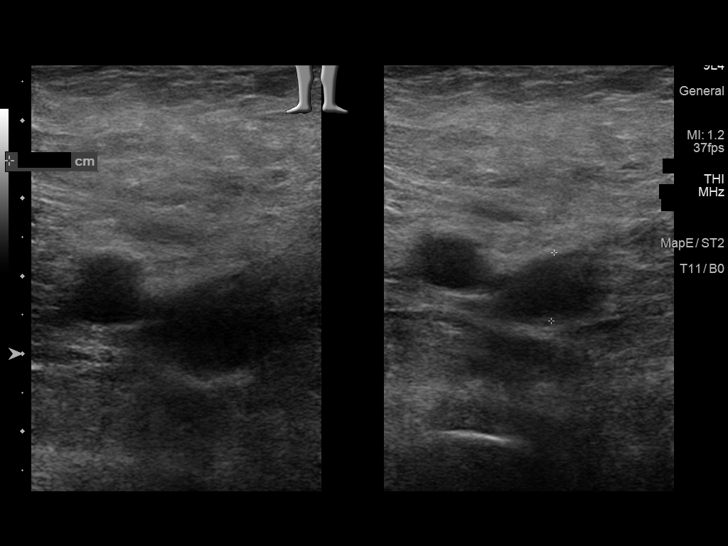
[im 4/40]
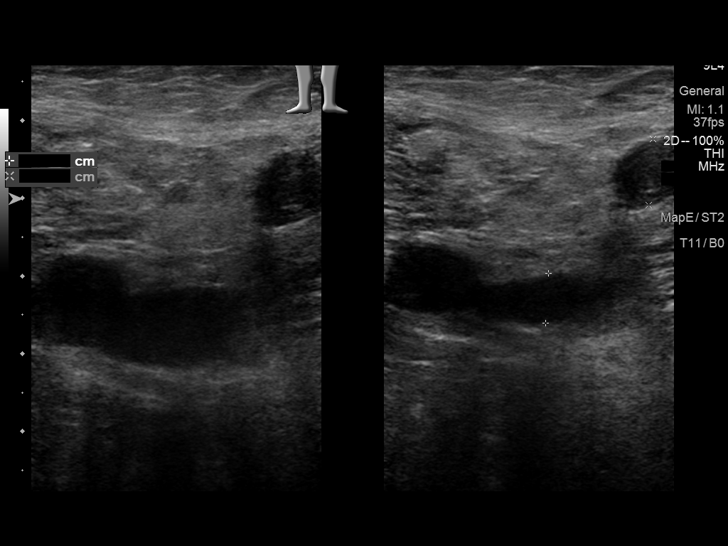
[im 7/40]
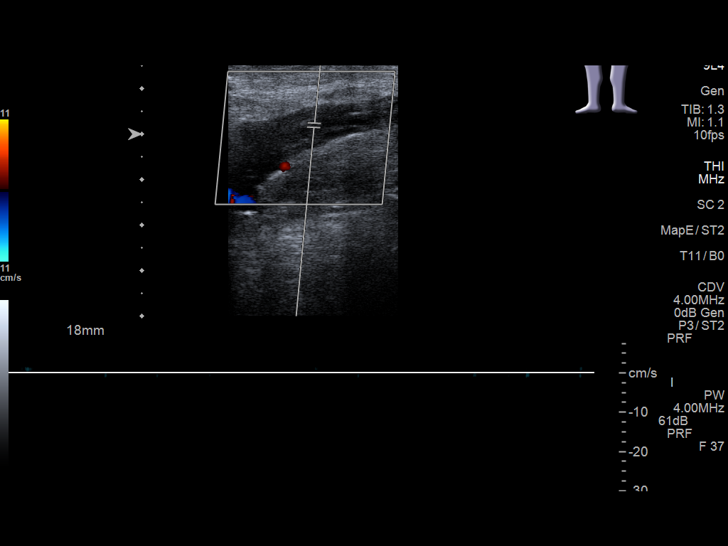
[im 11/40]
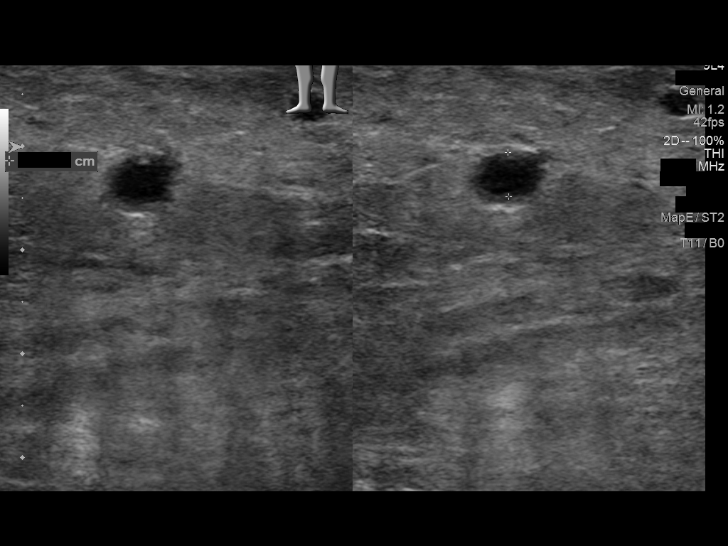
[im 14/40]
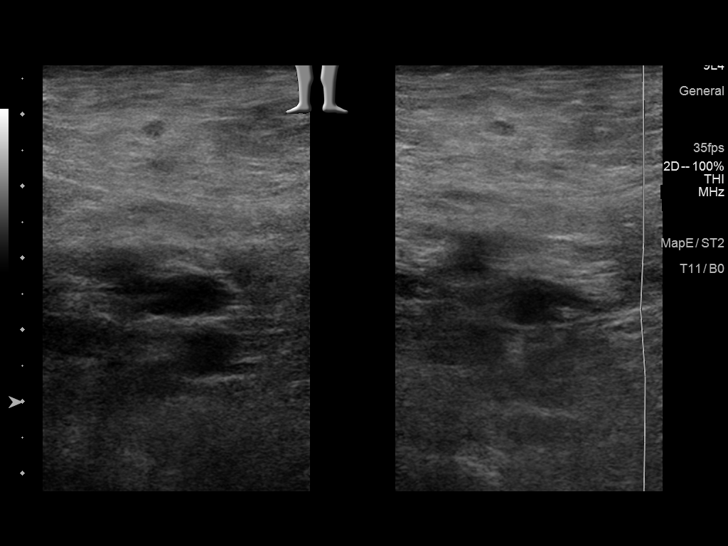
[im 17/40]
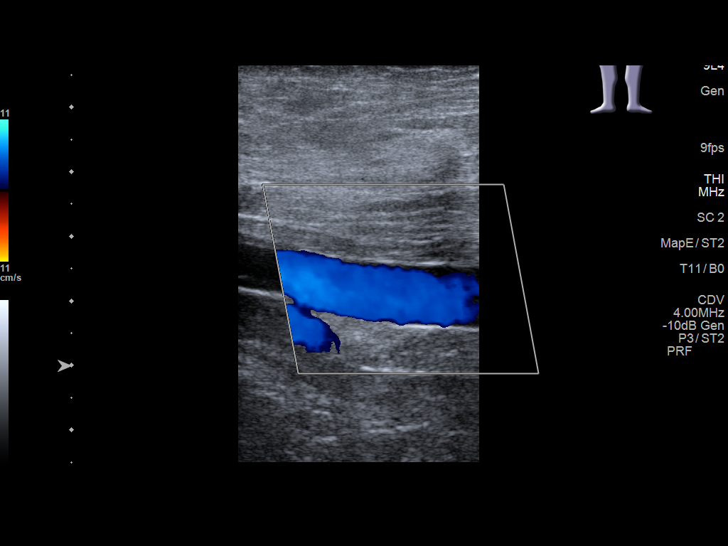
[im 21/40]
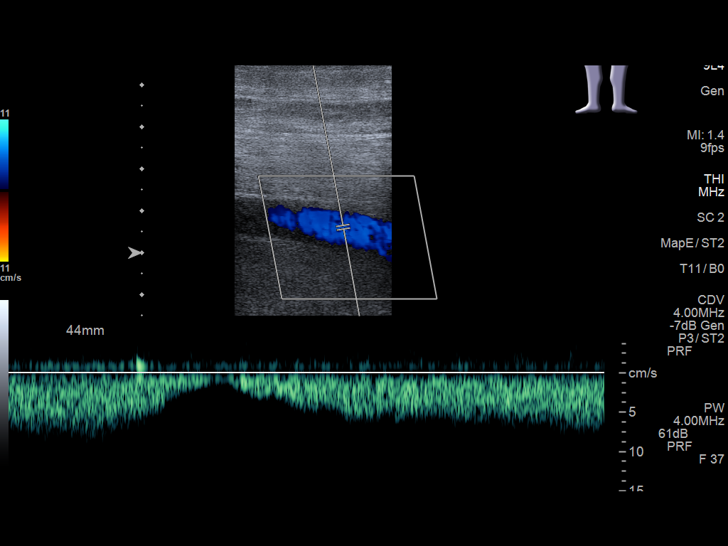
[im 23/40]
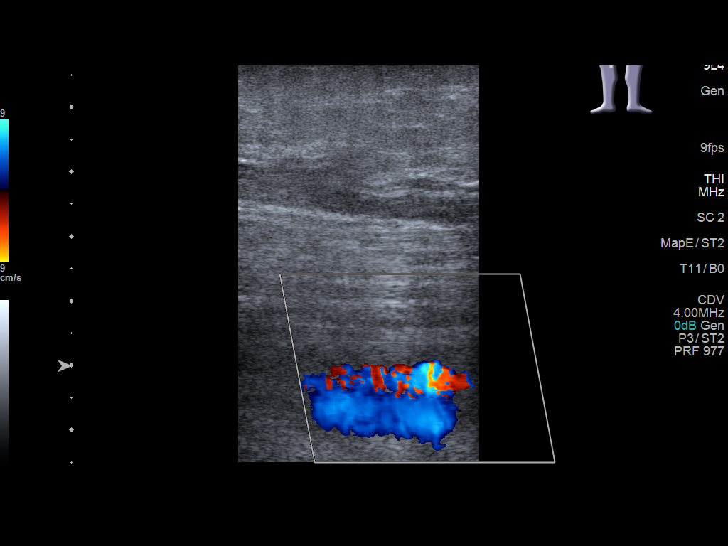
[im 26/40]
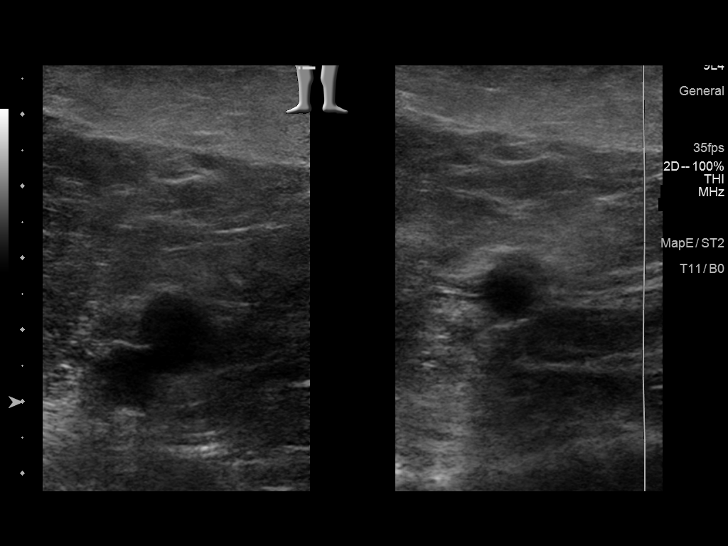
[im 29/40]
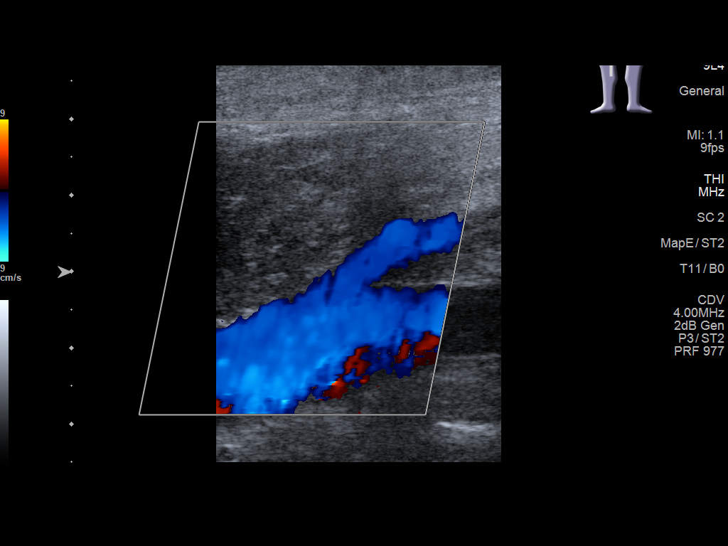
[im 33/40]
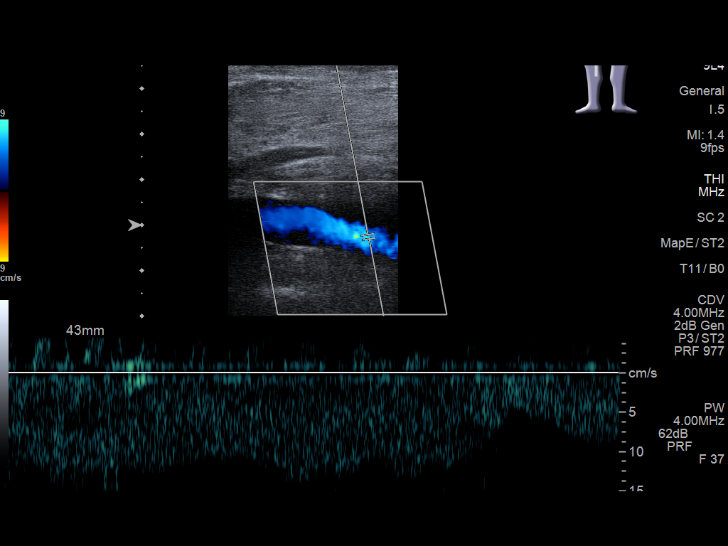
[im 36/40]
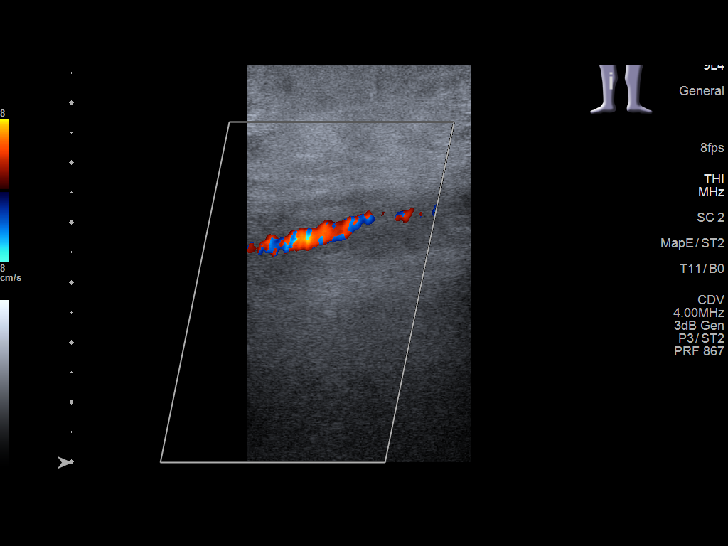
[im 40/40]
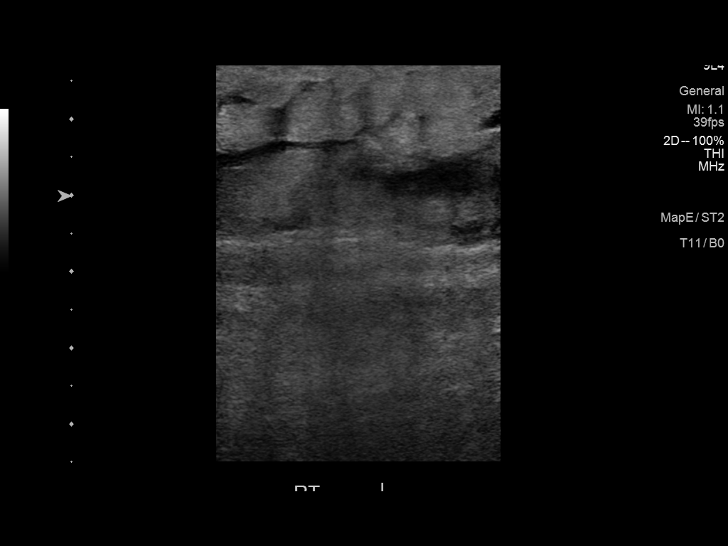

[13 of 24 positions shown; findings below may reference images not displayed]

FINDINGS: RIGHT LOWER EXTREMITY

Common Femoral Vein: Noncompressible with hypoechoic thrombus,
nonocclusive.

Central Greater Saphenous Vein: Noncompressible with occlusive
hypoechoic thrombus.

Central Profunda Femoral Vein: No evidence of thrombus. Normal
compressibility and flow on color Doppler imaging.

Femoral Vein: No evidence of thrombus. Normal compressibility,
respiratory phasicity and response to augmentation.

Popliteal Vein: No evidence of thrombus. Normal compressibility,
respiratory phasicity and response to augmentation.

Calf Veins: Poorly visualized.

Other Findings:  Subcutaneous edema noted in the right calf.

LEFT LOWER EXTREMITY

Common Femoral Vein: No evidence of thrombus. Normal
compressibility, respiratory phasicity and response to augmentation.
IMPRESSION: Occlusive thrombus throughout the visualized right greater saphenous
vein (a superficial vein, this is not a deep vein thrombosis)
extending into the common femoral vein were is nonocclusive (this is
a deep vein thrombosis). The central extent of thrombus is not
visualized on this study.

Recommend CT venogram of the abdomen and pelvis to evaluate for
iliofemoral extension of thrombus.

These results were called by telephone at the time of interpretation
on 05/26/2020 at [DATE] to provider Hueppi, Sangann , who verbally
acknowledged these results.

## 2022-09-14 ENCOUNTER — Other Ambulatory Visit: Payer: Self-pay | Admitting: Family Medicine

## 2022-09-14 DIAGNOSIS — E039 Hypothyroidism, unspecified: Secondary | ICD-10-CM

## 2022-09-14 DIAGNOSIS — M5417 Radiculopathy, lumbosacral region: Secondary | ICD-10-CM

## 2022-09-15 ENCOUNTER — Other Ambulatory Visit: Payer: Self-pay

## 2022-09-16 ENCOUNTER — Other Ambulatory Visit: Payer: Self-pay

## 2022-09-18 ENCOUNTER — Other Ambulatory Visit: Payer: Self-pay

## 2022-09-20 ENCOUNTER — Other Ambulatory Visit: Payer: Self-pay

## 2022-09-20 ENCOUNTER — Encounter: Payer: Self-pay | Admitting: Physician Assistant

## 2022-09-20 ENCOUNTER — Telehealth: Payer: BC Managed Care – PPO | Admitting: Physician Assistant

## 2022-09-20 ENCOUNTER — Encounter: Payer: Self-pay | Admitting: Oncology

## 2022-09-20 DIAGNOSIS — Z9189 Other specified personal risk factors, not elsewhere classified: Secondary | ICD-10-CM | POA: Diagnosis not present

## 2022-09-20 DIAGNOSIS — G43019 Migraine without aura, intractable, without status migrainosus: Secondary | ICD-10-CM | POA: Diagnosis not present

## 2022-09-20 MED ORDER — PREDNISONE 10 MG PO TABS
ORAL_TABLET | ORAL | 0 refills | Status: DC
  Filled 2022-09-20: qty 21, 6d supply, fill #0

## 2022-09-20 NOTE — Progress Notes (Signed)
Virtual Visit Consent   Michaela Jones, you are scheduled for a virtual visit with a [facility] provider today. Just as with appointments in the office, your consent must be obtained to participate. Your consent will be active for this visit and any virtual visit you may have with one of our providers in the next 365 days. If you have a MyChart account, a copy of this consent can be sent to you electronically.  As this is a virtual visit, video technology does not allow for your provider to perform a traditional examination. This may limit your provider's ability to fully assess your condition. If your provider identifies any concerns that need to be evaluated in person or the need to arrange testing (such as labs, EKG, etc.), we will make arrangements to do so. Although advances in technology are sophisticated, we cannot ensure that it will always work on either your end or our end. If the connection with a video visit is poor, the visit may have to be switched to a telephone visit. With either a video or telephone visit, we are not always able to ensure that we have a secure connection.  By engaging in this virtual visit, you consent to the provision of healthcare and authorize for your insurance to be billed (if applicable) for the services provided during this visit. Depending on your insurance coverage, you may receive a charge related to this service.  I need to obtain your verbal consent now. Are you willing to proceed with your visit today? Michaela Jones has provided verbal consent on 09/20/2022 for a virtual visit (video or telephone). Michaela Jones, [place]  Date: 09/20/2022 11:10 AM  Virtual Visit via Video Note   I, Michaela Jones, connected with  Michaela Jones  ([id number], Jun 06, 1976) on 09/20/22 at 10:15 AM EST by a video-enabled telemedicine application and verified that I am speaking with the correct person using two identifiers.  Location: Patient: Virtual Visit Location  Patient: Home Provider: Virtual Visit Location Provider: Home Office   I discussed the limitations of evaluation and management by telemedicine and the availability of in person appointments. The patient expressed understanding and agreed to proceed.    History of Present Illness: Michaela Jones is a 47 y.o. who identifies as a female who was assigned female at birth, and is being seen today for migraine starting yesterday evening. Came home and rested. This morning noting continued migraine headache, not resolving with her regular medications. Notes headache is associated with nausea and photophobia. Headache is more frontal - was unilateral but now bilateral. Some exposure to [place] -- works as a Pharmacist, hospital.  Denies fever, chills. Starting with some nasal congestion and loose stool starting this AM.  Took her promethazine to hep with nausea.  HPI: HPI  Problems:  Patient Active Problem List   Diagnosis Date Noted   B12 deficiency 05/27/2022   DVT of deep femoral vein ([place]) 05/26/2020   Chronic pain disorder 05/16/2020   Chronic, continuous use of opioids 05/16/2020   Anxiety 06/17/2018   Lumbar postlaminectomy syndrome 10/23/2017   Lumbosacral radiculopathy 10/23/2017   Vitamin D deficiency 03/01/2015   Acquired spondylolisthesis 03/01/2015   Elevated LDL cholesterol level 03/01/2015   Chronic constipation 03/01/2015   Dysthymia 11/11/2013   Absolute anemia 10/15/2012   Gastric bypass status for obesity 10/15/2012   Insomnia 09/03/2007   Allergic rhinitis 06/15/2007   Iron deficiency anemia 01/22/2007   Restless leg 09/23/2006   Headache, migraine 08/27/2006  Acquired lymphedema 07/29/1998   Adult hypothyroidism 07/29/1992    Allergies:  Allergies  Allergen Reactions   Lyrica [Pregabalin] Other (See Comments)    Patient reports neurological problem. Speech disturbances and unable to control motor skills   Gabapentin Swelling   Nsaids Other (See Comments)    Avoids due to  gastric bypass Avoids due to gastric bypass   Azithromycin Other (See Comments)    Very low absorption due to bariatric surgery; often resulting in therapeutic failure.  Use alternative abx, such as doxycycline or clarithromycin.   Penicillins Rash   Medications:  Current Outpatient Medications:    predniSONE (DELTASONE) 10 MG tablet, Take 6 tablets (60 mg total) by mouth daily for 1 day, THEN 5 tablets (50 mg total) daily for 1 day, THEN 4 tablets (40 mg total) daily for 1 day, THEN 3 tablets (30 mg total) daily for 1 day, THEN 2 tablets (20 mg total) daily for 1 day, THEN 1 tablet (10 mg total) daily for 1 day., Disp: 21 tablet, Rfl: 0   albuterol (PROVENTIL) (5 MG/ML) 0.5% nebulizer solution, Inhale into the lungs., Disp: , Rfl:    albuterol (VENTOLIN HFA) 108 (90 Base) MCG/ACT inhaler, Inhale 2 puffs into the lungs every 6 (six) hours as needed for wheezing or shortness of breath., Disp: 8.5 g, Rfl: 0   albuterol (VENTOLIN HFA) 108 (90 Base) MCG/ACT inhaler, Inhale 2 puffs into the lungs every 6 (six) hours as needed for wheezing or shortness of breath., Disp: 6.7 g, Rfl: 1   ALPRAZolam (XANAX) 1 MG tablet, Take 0.5-1 tablets (0.5-1 mg total) by mouth 3 (three) times daily as needed for anxiety., Disp: 90 tablet, Rfl: 5   apixaban (ELIQUIS) 5 MG TABS tablet, Take 1 tablet (5 mg total) by mouth 2 (two) times daily., Disp: 60 tablet, Rfl: 0   Armodafinil 150 MG tablet, Take 1 tablet (150 mg total) by mouth every morning., Disp: 30 tablet, Rfl: 5   cetirizine (ZYRTEC) 10 MG tablet, Take 10 mg by mouth daily as needed for allergies., Disp: , Rfl:    chlorhexidine (PERIDEX) 0.12 % solution, RINSE MOUTH WITH 15ML (1 CAPFUL) FOR 30 SECONDS AM AND PM AFTER TOOTHBRUSHING. EXPECTORATE AFTER RINSING, DO NOT SWALLOW, Disp: 473 mL, Rfl: 3   chlorhexidine (PERIDEX) 0.12 % solution, RINSE MOUTH WITH 15ML (1 CAPFUL) FOR 30 SECONDS AM AND PM AFTER TOOTHBRUSHING. EXPECTORATE AFTER RINSING, DO NOT SWALLOW, Disp:  473 mL, Rfl: 3   cholecalciferol (VITAMIN D3) 25 MCG (1000 UNIT) tablet, Take by mouth., Disp: , Rfl:    cyanocobalamin (VITAMIN B12) 1000 MCG/ML injection, INJECT 1 MILLILITER (ML) EVERY TWO WEEKS FOR THREE MONTHS THEN ONCE A  MONTH, Disp: 6 mL, Rfl: 4   DULoxetine (CYMBALTA) 60 MG capsule, Take 1 capsule (60 mg total) by mouth daily., Disp: 30 capsule, Rfl: 2   fluticasone (FLONASE) 50 MCG/ACT nasal spray, Place 2 sprays into both nostrils daily. (Patient taking differently: Place 2 sprays into both nostrils daily as needed.), Disp: 16 g, Rfl: 6   HYDROcodone-acetaminophen (NORCO) 10-325 MG tablet, Take 1 tablet by mouth 4 (four) times daily as needed for severe pain., Disp: 100 tablet, Rfl: 0   Insulin Pen Needle (NOVOFINE PEN NEEDLE) 32G X 6 MM MISC, Use daily with saxenda., Disp: 100 each, Rfl: 0   levothyroxine (SYNTHROID) 150 MCG tablet, Take 1 tablet (150 mcg total) by mouth daily. Please schedule an office visit before anymore refills., Disp: 90 tablet, Rfl: 1   Multiple  Vitamin (MULTIVITAMIN) tablet, Take 1 tablet by mouth daily., Disp: , Rfl:    promethazine (PHENERGAN) 25 MG tablet, Take 1 tablet (25 mg total) by mouth every 8 (eight) hours as needed for nausea or vomiting., Disp: 30 tablet, Rfl: 2   Suvorexant (BELSOMRA) 20 MG TABS, Take 20 mg by mouth at bedtime., Disp: 30 tablet, Rfl: 5  Observations/Objective: Patient is well-developed, well-nourished in no acute distress.  Resting comfortably at home.  Head is normocephalic, atraumatic.  No labored breathing. Speech is clear and coherent with logical content.  Patient is alert and oriented at baseline.   Assessment and Plan: 1. Intractable migraine without aura and without status migrainosus - predniSONE (DELTASONE) 10 MG tablet; Take 6 tablets (60 mg total) by mouth daily for 1 day, THEN 5 tablets (50 mg total) daily for 1 day, THEN 4 tablets (40 mg total) daily for 1 day, THEN 3 tablets (30 mg total) daily for 1 day, THEN 2  tablets (20 mg total) daily for 1 day, THEN 1 tablet (10 mg total) daily for 1 day.  Dispense: 21 tablet; Refill: 0  2. At increased risk of exposure to COVID-19 virus  Will have her COVID test to be cautious giving start of loose stool this AM and nasal congestion as this could be cause and triggering her migraine. She is to let us know the results ASAP this morning. Supportive measures  -- BRAT diet included -- reviewed. Can use promethazine as prescribed. Will give steroid pack to abort headache and break headache cycle. Strict in-person follow-up discussed.   Follow Up Instructions: I discussed the assessment and treatment plan with the patient. The patient was provided an opportunity to ask questions and all were answered. The patient agreed with the plan and demonstrated an understanding of the instructions.  A copy of instructions were sent to the patient via MyChart unless otherwise noted below.   The patient was advised to call back or seek an in-person evaluation if the symptoms worsen or if the condition fails to improve as anticipated.  Time:  I spent 10 minutes with the patient via telehealth technology discussing the above problems/concerns.    Michaela Rio, PA-C

## 2022-09-20 NOTE — Patient Instructions (Signed)
Michaela Jones, thank you for joining Leeanne Rio, PA-C for today's virtual visit.  While this provider is not your primary care provider (PCP), if your PCP is located in our provider database this encounter information will be shared with them immediately following your visit.   [facility] account gives you access to today's visit and all your visits, tests, and labs performed at [facility] " click here if you don't have a [facility] account or go to [email or web address]  Consent: (Patient) Michaela Jones provided verbal consent for this virtual visit at the beginning of the encounter.  Current Medications:  Current Outpatient Medications:    albuterol (PROVENTIL) (5 MG/ML) 0.5% nebulizer solution, Inhale into the lungs., Disp: , Rfl:    albuterol (VENTOLIN HFA) 108 (90 Base) MCG/ACT inhaler, Inhale 2 puffs into the lungs every 6 (six) hours as needed for wheezing or shortness of breath., Disp: 8.5 g, Rfl: 0   albuterol (VENTOLIN HFA) 108 (90 Base) MCG/ACT inhaler, Inhale 2 puffs into the lungs every 6 (six) hours as needed for wheezing or shortness of breath., Disp: 6.7 g, Rfl: 1   ALPRAZolam (XANAX) 1 MG tablet, Take 0.5-1 tablets (0.5-1 mg total) by mouth 3 (three) times daily as needed for anxiety., Disp: 90 tablet, Rfl: 5   apixaban (ELIQUIS) 5 MG TABS tablet, Take 1 tablet (5 mg total) by mouth 2 (two) times daily., Disp: 60 tablet, Rfl: 0   Armodafinil 150 MG tablet, Take 1 tablet (150 mg total) by mouth every morning., Disp: 30 tablet, Rfl: 5   cetirizine (ZYRTEC) 10 MG tablet, Take 10 mg by mouth daily as needed for allergies., Disp: , Rfl:    chlorhexidine (PERIDEX) 0.12 % solution, RINSE MOUTH WITH 15ML (1 CAPFUL) FOR 30 SECONDS AM AND PM AFTER TOOTHBRUSHING. EXPECTORATE AFTER RINSING, DO NOT SWALLOW, Disp: 473 mL, Rfl: 3   chlorhexidine (PERIDEX) 0.12 % solution, RINSE MOUTH WITH 15ML (1 CAPFUL) FOR 30 SECONDS AM AND PM AFTER TOOTHBRUSHING.  EXPECTORATE AFTER RINSING, DO NOT SWALLOW, Disp: 473 mL, Rfl: 3   cholecalciferol (VITAMIN D3) 25 MCG (1000 UNIT) tablet, Take by mouth., Disp: , Rfl:    cyanocobalamin (VITAMIN B12) 1000 MCG/ML injection, INJECT 1 MILLILITER (ML) EVERY TWO WEEKS FOR THREE MONTHS THEN ONCE A  MONTH, Disp: 6 mL, Rfl: 4   DULoxetine (CYMBALTA) 60 MG capsule, Take 1 capsule (60 mg total) by mouth daily., Disp: 30 capsule, Rfl: 2   fluticasone (FLONASE) 50 MCG/ACT nasal spray, Place 2 sprays into both nostrils daily. (Patient taking differently: Place 2 sprays into both nostrils daily as needed.), Disp: 16 g, Rfl: 6   HYDROcodone-acetaminophen (NORCO) 10-325 MG tablet, Take 1 tablet by mouth 4 (four) times daily as needed for severe pain., Disp: 100 tablet, Rfl: 0   Insulin Pen Needle (NOVOFINE PEN NEEDLE) 32G X 6 MM MISC, Use daily with saxenda., Disp: 100 each, Rfl: 0   levothyroxine (SYNTHROID) 150 MCG tablet, Take 1 tablet (150 mcg total) by mouth daily. Please schedule an office visit before anymore refills., Disp: 90 tablet, Rfl: 1   Multiple Vitamin (MULTIVITAMIN) tablet, Take 1 tablet by mouth daily., Disp: , Rfl:    promethazine (PHENERGAN) 25 MG tablet, Take 1 tablet (25 mg total) by mouth every 8 (eight) hours as needed for nausea or vomiting., Disp: 30 tablet, Rfl: 2   Suvorexant (BELSOMRA) 20 MG TABS, Take 20 mg by mouth at bedtime., Disp: 30 tablet, Rfl: 5   trimethoprim-polymyxin  b (POLYTRIM) ophthalmic solution, Place 1 drop into both eyes every 4 (four) hours. X 5 days, Disp: 10 mL, Rfl: 0   Medications ordered in this encounter:  No orders of the defined types were placed in this encounter.    *If you need refills on other medications prior to your next appointment, please contact your pharmacy*  Follow-Up: Call back or seek an in-person evaluation if the symptoms worsen or if the condition fails to improve as anticipated.  [place] [phone]  Other Instructions Please take  prescribed medications as directed. Keep bland diet. Ok to continue your regular medications and promethazine as directed. As discussed, test for COVID and let us know the results ASAP.    If you have been instructed to have an in-person evaluation today at a local Urgent Care facility, please use the link below. It will take you to a list of all of our available [facility], including address, phone number and hours of operation. Please do not delay care.  [facility]  If you or a family member do not have a primary care provider, use the link below to schedule a visit and establish care. When you choose a [facility] primary care physician or advanced practice provider, you gain a long-term partner in health. Find a Primary Care Provider  Learn more about [facility]'s in-office and virtual care options: [facility]

## 2022-09-23 ENCOUNTER — Other Ambulatory Visit: Payer: Self-pay

## 2022-09-23 ENCOUNTER — Other Ambulatory Visit: Payer: Self-pay | Admitting: Family Medicine

## 2022-09-23 ENCOUNTER — Telehealth (INDEPENDENT_AMBULATORY_CARE_PROVIDER_SITE_OTHER): Payer: BC Managed Care – PPO | Admitting: Physician Assistant

## 2022-09-23 ENCOUNTER — Telehealth: Payer: Self-pay | Admitting: Family Medicine

## 2022-09-23 ENCOUNTER — Ambulatory Visit: Payer: Self-pay | Admitting: *Deleted

## 2022-09-23 DIAGNOSIS — M961 Postlaminectomy syndrome, not elsewhere classified: Secondary | ICD-10-CM

## 2022-09-23 DIAGNOSIS — G43019 Migraine without aura, intractable, without status migrainosus: Secondary | ICD-10-CM | POA: Diagnosis not present

## 2022-09-23 DIAGNOSIS — F1190 Opioid use, unspecified, uncomplicated: Secondary | ICD-10-CM
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
## 2022-09-23 NOTE — Telephone Encounter (Signed)
Patient called in checking status of refill of hydrochodone. She is all out, having headaches

## 2022-09-23 NOTE — Telephone Encounter (Signed)
Requested medication (s) are due for refill today - provider review   Requested medication (s) are on the active medication list -yes  Future visit scheduled today  Last refill: 08/31/22 #100  Notes to clinic: non delegated Rx  Requested Prescriptions  Pending Prescriptions Disp Refills   HYDROcodone-acetaminophen (NORCO) 10-325 MG tablet 100 tablet 0    Sig: Take 1 tablet by mouth 4 (four) times daily as needed for severe pain.     Not Delegated - Analgesics:  Opioid Agonist Combinations Failed - 09/23/2022 11:50 AM      Failed - This refill cannot be delegated      Failed - Urine Drug Screen completed in last 360 days      Failed - Valid encounter within last 3 months    Recent Outpatient Visits           3 months ago Adult hypothyroidism   [place], Donald E, MD   4 months ago Chronic, continuous use of opioids   [facility] Birdie Sons, MD   5 months ago Need for influenza vaccination   [facility] Rory Percy M, DO   10 months ago Vitamin D deficiency   [facility] Birdie Sons, MD   1 year ago [place], MD       Future Appointments             Today Mardene Speak, PA-C [place], [facility]               Requested Prescriptions  Pending Prescriptions Disp Refills   HYDROcodone-acetaminophen (NORCO) 10-325 MG tablet 100 tablet 0    Sig: Take 1 tablet by mouth 4 (four) times daily as needed for severe pain.     Not Delegated - Analgesics:  Opioid Agonist Combinations Failed - 09/23/2022 11:50 AM      Failed - This refill cannot be delegated      Failed - Urine Drug Screen completed in last 360 days      Failed - Valid encounter within last 3 months    Recent Outpatient Visits           3 months ago Adult hypothyroidism   [place], Donald E, MD   4 months ago Chronic, continuous use of opioids   [facility] Birdie Sons, MD   5 months ago Need for influenza vaccination   [facility] Myles Gip, DO   10 months ago Vitamin D deficiency   [facility] Birdie Sons, MD   1 year ago [place], Donald E, MD       Future Appointments             Today [facility], [facility]

## 2022-09-23 NOTE — Telephone Encounter (Signed)
Disdregard prev message

## 2022-09-23 NOTE — Telephone Encounter (Signed)
  Chief Complaint: Headache Symptoms: Frontal headache, behind eyes,H/O migraines "But it's been a while."  7/10 constant Frequency: Friday Pertinent Negatives: Patient denies  Disposition: []$ ED /[]$ Urgent Care (no appt availability in office) / [x]$ Appointment(In office/virtual)/ []$  [place] [facility]/ []$ Home Care/ []$ Refused Recommended Disposition /[]$ [facility]/ []$  Follow-up with PCP Additional Notes: Pt did [facility] Virtual Friday, placed on steroids "For migraine"  States at that time had diarrhea and nausea also. Initially calling to request refill of hydrocodone Dr. Caryn Section prescribes for chronic pain. Routed to NT.   Pt did not do covid test yet as advised by [facility] Virtual provider. She is also requesting "A migraine medication that was prescribed a while ago." Secured virtual appt, only appt she could make due to work schedule.  Reason for Disposition  [1] MODERATE headache (e.g., interferes with normal activities) AND [2] present > 24 hours AND [3] unexplained  (Exceptions: analgesics not tried, typical migraine, or headache part of viral illness)  Answer Assessment - Initial Assessment Questions 1. LOCATION: "Where does it hurt?"      Behind eyes, forehead 2. ONSET: "When did the headache start?" (Minutes, hours or days)      Did VV [facility] Friday 3. PATTERN: "Does the pain come and go, or has it been constant since it started?"     Constant 4. SEVERITY: "How bad is the pain?" and "What does it keep you from doing?"  (e.g., Scale 1-10; mild, moderate, or severe)   - MILD (1-3): doesn't interfere with normal activities    - MODERATE (4-7): interferes with normal activities or awakens from sleep    - SEVERE (8-10): excruciating pain, unable to do any normal activities        7/10 5. RECURRENT SYMPTOM: "Have you ever had headaches before?" If Yes, ask: "When was the last time?" and "What happened that time?"       6. CAUSE: "What do you think is causing the headache?"      Migraine. 7. MIGRAINE: "Have you been diagnosed with migraine headaches?" If Yes, ask: "Is this headache similar?"      Yes 8. HEAD INJURY: "Has there been any recent injury to the head?"      No 9. OTHER SYMPTOMS: "Do you have any other symptoms?" (fever, stiff neck, eye pain, sore throat, cold symptoms)     Photophobia  Protocols used: Headache-A-AH

## 2022-09-23 NOTE — Telephone Encounter (Signed)
Patient

## 2022-09-24 ENCOUNTER — Other Ambulatory Visit: Payer: Self-pay

## 2022-09-24 ENCOUNTER — Encounter: Payer: Self-pay | Admitting: Physician Assistant

## 2022-09-24 MED FILL — Hydrocodone-Acetaminophen Tab 10-325 MG: ORAL | 25 days supply | Qty: 100 | Fill #0 | Status: CN

## 2022-09-25 ENCOUNTER — Encounter: Payer: Self-pay | Admitting: Oncology

## 2022-09-25 ENCOUNTER — Other Ambulatory Visit: Payer: Self-pay

## 2022-09-25 MED FILL — Hydrocodone-Acetaminophen Tab 10-325 MG: ORAL | 25 days supply | Qty: 100 | Fill #0 | Status: AC

## 2022-09-26 ENCOUNTER — Other Ambulatory Visit: Payer: Self-pay

## 2022-09-29 ENCOUNTER — Other Ambulatory Visit: Payer: Self-pay

## 2022-10-01 ENCOUNTER — Other Ambulatory Visit: Payer: Self-pay

## 2022-10-14 ENCOUNTER — Other Ambulatory Visit: Payer: Self-pay | Admitting: Family Medicine

## 2022-10-14 ENCOUNTER — Ambulatory Visit: Payer: BC Managed Care – PPO | Admitting: Family Medicine

## 2022-10-14 ENCOUNTER — Encounter: Payer: Self-pay | Admitting: Oncology

## 2022-10-14 ENCOUNTER — Encounter: Payer: Self-pay | Admitting: Family Medicine

## 2022-10-14 ENCOUNTER — Telehealth: Payer: Self-pay | Admitting: Family Medicine

## 2022-10-14 ENCOUNTER — Other Ambulatory Visit: Payer: Self-pay

## 2022-10-14 VITALS — BP 108/77 | HR 112 | Temp 97.8°F | Wt 244.1 lb

## 2022-10-14 DIAGNOSIS — R051 Acute cough: Secondary | ICD-10-CM

## 2022-10-14 DIAGNOSIS — J0110 Acute frontal sinusitis, unspecified: Secondary | ICD-10-CM

## 2022-10-14 DIAGNOSIS — M961 Postlaminectomy syndrome, not elsewhere classified: Secondary | ICD-10-CM

## 2022-10-14 DIAGNOSIS — G43E09 Chronic migraine with aura, not intractable, without status migrainosus: Secondary | ICD-10-CM | POA: Diagnosis not present

## 2022-10-14 DIAGNOSIS — E039 Hypothyroidism, unspecified: Secondary | ICD-10-CM

## 2022-10-14 DIAGNOSIS — F1190 Opioid use, unspecified, uncomplicated: Secondary | ICD-10-CM

## 2022-10-14 DIAGNOSIS — J029 Acute pharyngitis, unspecified: Secondary | ICD-10-CM | POA: Diagnosis not present

## 2022-10-14 DIAGNOSIS — I82411 Acute embolism and thrombosis of right femoral vein: Secondary | ICD-10-CM

## 2022-10-14 DIAGNOSIS — G894 Chronic pain syndrome: Secondary | ICD-10-CM

## 2022-10-14 LAB — POC COVID19 BINAXNOW: SARS Coronavirus 2 Ag: NEGATIVE

## 2022-10-14 LAB — POCT RAPID STREP A (OFFICE): Rapid Strep A Screen: NEGATIVE

## 2022-10-14 MED ORDER — VENLAFAXINE HCL ER 37.5 MG PO CP24
37.5000 mg | ORAL_CAPSULE | Freq: Every day | ORAL | 0 refills | Status: DC
  Filled 2022-10-14: qty 60, 60d supply, fill #0

## 2022-10-14 MED ORDER — DOXYCYCLINE HYCLATE 100 MG PO TABS
100.0000 mg | ORAL_TABLET | Freq: Two times a day (BID) | ORAL | 0 refills | Status: AC
  Filled 2022-10-14: qty 20, 10d supply, fill #0

## 2022-10-14 MED ORDER — NURTEC 75 MG PO TBDP: 1.0000 | ORAL_TABLET | ORAL | 0 refills | Status: DC

## 2022-10-14 MED ORDER — PROMETHAZINE-CODEINE 6.25-10 MG/5ML PO SYRP
5.0000 mL | ORAL_SOLUTION | Freq: Four times a day (QID) | ORAL | 0 refills | Status: DC | PRN
  Filled 2022-10-14: qty 120, 3d supply, fill #0

## 2022-10-14 MED ORDER — HYDROCOD POLI-CHLORPHE POLI ER 10-8 MG/5ML PO SUER: 5.0000 mL | Freq: Two times a day (BID) | ORAL | 0 refills | Status: DC | PRN

## 2022-10-14 MED FILL — Levothyroxine Sodium Tab 150 MCG: ORAL | 90 days supply | Qty: 90 | Fill #0 | Status: CN

## 2022-10-14 MED FILL — Apixaban Tab 5 MG: ORAL | 30 days supply | Qty: 60 | Fill #0 | Status: AC

## 2022-10-14 NOTE — Telephone Encounter (Signed)
Medication Refill - Medication: promethazine-codeine (PHENERGAN WITH CODEINE) 6.25-10 MG/5ML syrup   Has the patient contacted their pharmacy? Yes.    Preferred Pharmacy (with phone number or street name):  This pharmacy [facility]  does not carry this medication and they will not transfer the script.  Pt also stated [facility] pharmacy is wiped out of the medication.     Patient is asking if dr can call  [facility] She also stated that their supply is limited... [id number] [place], [place] the patient been seen for an appointment in the last year OR does the patient have an upcoming appointment? Yes.    Agent: Please be advised that RX refills may take up to 3 business days. We ask that you follow-up with your pharmacy.

## 2022-10-14 NOTE — Patient Instructions (Signed)
Please review the attached list of medications and notify my office if there are any errors.   Start venlafaxine by taking 1 tablet daily for 2 weeks, then increase to 2 tablets daily

## 2022-10-14 NOTE — Progress Notes (Signed)
I,Sha'taria Tyson,acting as a Education administrator for Lelon Huh, MD.,have documented all relevant documentation on the behalf of Lelon Huh, MD,as directed by  Lelon Huh, MD while in the presence of Lelon Huh, MD.   Established patient visit   Patient: Michaela Jones   DOB: [birth year]   47 y.o. Female  MRN: [id number] Visit Date: 10/14/2022  Today's healthcare provider: Lelon Huh, MD   No chief complaint on file.  Subjective    Sore Throat  This is a new problem. The current episode started yesterday. Neither side of throat is experiencing more pain than the other. The maximum temperature recorded prior to her arrival was 101 - 101.9 F. Associated symptoms include congestion, diarrhea, headaches and vomiting. Associated symptoms comments: Lower left side back pain. Treatments tried: soft food diet.   She is also here to follow up for chronic back, has had trial of duloxetine for several months, but states she stopped due to have episodes where she felt like she lost time and felt out of it. She is interested in trying a different medication.   She also continues to have frequent headaches, had virtual visit with Ashok Norris and planned on trial of Nurtec, but has not had a chance to pick up samples yet.   Medications: Outpatient Medications Prior to Visit  Medication Sig   albuterol (PROVENTIL) (5 MG/ML) 0.5% nebulizer solution Inhale into the lungs.   albuterol (VENTOLIN HFA) 108 (90 Base) MCG/ACT inhaler Inhale 2 puffs into the lungs every 6 (six) hours as needed for wheezing or shortness of breath.   albuterol (VENTOLIN HFA) 108 (90 Base) MCG/ACT inhaler Inhale 2 puffs into the lungs every 6 (six) hours as needed for wheezing or shortness of breath.   ALPRAZolam (XANAX) 1 MG tablet Take 0.5-1 tablets (0.5-1 mg total) by mouth 3 (three) times daily as needed for anxiety.   apixaban (ELIQUIS) 5 MG TABS tablet Take 1 tablet (5 mg total) by mouth 2 (two) times daily.   Armodafinil 150  MG tablet Take 1 tablet (150 mg total) by mouth every morning.   cetirizine (ZYRTEC) 10 MG tablet Take 10 mg by mouth daily as needed for allergies.   chlorhexidine (PERIDEX) 0.12 % solution RINSE MOUTH WITH 15ML (1 CAPFUL) FOR 30 SECONDS AM AND PM AFTER TOOTHBRUSHING. EXPECTORATE AFTER RINSING, DO NOT SWALLOW   chlorhexidine (PERIDEX) 0.12 % solution RINSE MOUTH WITH 15ML (1 CAPFUL) FOR 30 SECONDS AM AND PM AFTER TOOTHBRUSHING. EXPECTORATE AFTER RINSING, DO NOT SWALLOW   cholecalciferol (VITAMIN D3) 25 MCG (1000 UNIT) tablet Take by mouth.   cyanocobalamin (VITAMIN B12) 1000 MCG/ML injection INJECT 1 MILLILITER (ML) EVERY TWO WEEKS FOR THREE MONTHS THEN ONCE A  MONTH   DULoxetine (CYMBALTA) 60 MG capsule Take 1 capsule (60 mg total) by mouth daily.   fluticasone (FLONASE) 50 MCG/ACT nasal spray Place 2 sprays into both nostrils daily. (Patient taking differently: Place 2 sprays into both nostrils daily as needed.)   HYDROcodone-acetaminophen (NORCO) 10-325 MG tablet Take 1 tablet by mouth 4 (four) times daily as needed for severe pain.   Insulin Pen Needle (NOVOFINE PEN NEEDLE) 32G X 6 MM MISC Use daily with saxenda.   levothyroxine (SYNTHROID) 150 MCG tablet Take 1 tablet (150 mcg total) by mouth daily. Please schedule an office visit before anymore refills.   Multiple Vitamin (MULTIVITAMIN) tablet Take 1 tablet by mouth daily.   promethazine (PHENERGAN) 25 MG tablet Take 1 tablet (25 mg total) by mouth every 8 (  eight) hours as needed for nausea or vomiting.   Suvorexant (BELSOMRA) 20 MG TABS Take 20 mg by mouth at bedtime.   No facility-administered medications prior to visit.    Review of Systems  HENT:  Positive for congestion.   Gastrointestinal:  Positive for diarrhea and vomiting.  Neurological:  Positive for headaches.       Objective    BP 108/77 (BP Location: Left Arm, Patient Position: Sitting, Cuff Size: Large)   Pulse (!) 112   Temp 97.8 F (36.6 C) (Oral)   Wt 244 lb 1.6  oz (110.7 kg)   SpO2 100%   BMI 39.40 kg/m    Physical Exam   General Appearance:    Mildly obese female, alert, cooperative, in no acute distress  HENT:   bilateral TM normal without fluid or infection, neck without nodes, neck has bilateral anterior cervical nodes enlarged, bilateral  sinus tender, nasal mucosa congested. Post nasal drainage noted. Tonsils enlarged, but not erythematous and no exudate.   Eyes:    PERRL, conjunctiva/corneas clear, EOM's intact       Lungs:     Clear to auscultation bilaterally, respirations unlabored  Heart:    Tachycardic. Normal rhythm. No murmurs, rubs, or gallops.    Neurologic:   Awake, alert, oriented x 3. No apparent focal neurological           defect.       Covid - Negative Strep - Negative  Assessment & Plan     1. Acute non-recurrent frontal sinusitis  - doxycycline (VIBRA-TABS) 100 MG tablet; Take 1 tablet (100 mg total) by mouth 2 (two) times daily for 10 days.  Dispense: 20 tablet; Refill: 0  2. Acute cough  - promethazine-codeine (PHENERGAN WITH CODEINE) 6.25-10 MG/5ML syrup; Take 5-10 mLs by mouth every 6 (six) hours as needed for cough.  Dispense: 120 mL; Refill: 0 Advised not to take with oxycodone.   3. Chronic migraine with aura without status migrainosus, not intractable Try samples Rimegepant Sulfate (NURTEC) 75 MG TBDP; Take 1 tablet (75 mg total) by mouth every other day.  Dispense: 12 tablet; Refill: 0  4. Chronic pain disorder Did not tolerate duloxetine, try  venlafaxine XR (EFFEXOR XR) 37.5 MG 24 hr capsule; Take 1 capsule (37.5 mg total) by mouth daily with breakfast.  Dispense: 60 capsule; Refill: 0. May titrate to 75mg  after 2 weeks.   5. Chronic, continuous use of opioids Follow up in 2-3 months.      The entirety of the information documented in the History of Present Illness, Review of Systems and Physical Exam were personally obtained by me. Portions of this information were initially documented by the CMA  and reviewed by me for thoroughness and accuracy.     Lelon Huh, MD  [facility] [phone] (phone) [phone] (fax)  [place]

## 2022-10-15 ENCOUNTER — Other Ambulatory Visit: Payer: Self-pay

## 2022-10-15 MED FILL — Hydrocodone-Acetaminophen Tab 10-325 MG: ORAL | 25 days supply | Qty: 100 | Fill #0 | Status: CN

## 2022-10-17 ENCOUNTER — Encounter: Payer: Self-pay | Admitting: Oncology

## 2022-10-21 ENCOUNTER — Other Ambulatory Visit: Payer: Self-pay

## 2022-10-21 ENCOUNTER — Encounter: Payer: Self-pay | Admitting: Oncology

## 2022-10-21 MED FILL — Promethazine HCl Tab 25 MG: ORAL | 10 days supply | Qty: 30 | Fill #1 | Status: AC

## 2022-10-26 ENCOUNTER — Encounter: Payer: Self-pay | Admitting: Oncology

## 2022-10-26 ENCOUNTER — Other Ambulatory Visit: Payer: Self-pay

## 2022-10-26 ENCOUNTER — Other Ambulatory Visit: Payer: Self-pay | Admitting: Family Medicine

## 2022-10-26 DIAGNOSIS — G43E09 Chronic migraine with aura, not intractable, without status migrainosus: Secondary | ICD-10-CM

## 2022-10-26 MED FILL — Hydrocodone-Acetaminophen Tab 10-325 MG: ORAL | 25 days supply | Qty: 100 | Fill #0 | Status: AC

## 2022-10-28 ENCOUNTER — Other Ambulatory Visit: Payer: Self-pay

## 2022-10-28 MED ORDER — NURTEC 75 MG PO TBDP
1.0000 | ORAL_TABLET | ORAL | 3 refills | Status: DC
  Filled 2022-10-28: qty 16, 32d supply, fill #0
  Filled 2022-12-16: qty 16, 32d supply, fill #1
  Filled 2023-01-24: qty 16, 32d supply, fill #2
  Filled 2023-03-16 – 2023-04-23 (×4): qty 16, 32d supply, fill #3
  Filled 2023-07-08: qty 16, 32d supply, fill #4

## 2022-10-29 ENCOUNTER — Other Ambulatory Visit: Payer: Self-pay

## 2022-11-04 ENCOUNTER — Ambulatory Visit: Payer: Self-pay | Admitting: *Deleted

## 2022-11-04 ENCOUNTER — Ambulatory Visit: Payer: BC Managed Care – PPO | Admitting: Family Medicine

## 2022-11-04 ENCOUNTER — Other Ambulatory Visit: Payer: Self-pay

## 2022-11-04 ENCOUNTER — Encounter: Payer: Self-pay | Admitting: Family Medicine

## 2022-11-04 ENCOUNTER — Telehealth: Payer: Self-pay

## 2022-11-04 VITALS — BP 115/76 | HR 89 | Temp 98.5°F | Ht 66.0 in | Wt 239.9 lb

## 2022-11-04 DIAGNOSIS — R051 Acute cough: Secondary | ICD-10-CM | POA: Diagnosis not present

## 2022-11-04 DIAGNOSIS — H66002 Acute suppurative otitis media without spontaneous rupture of ear drum, left ear: Secondary | ICD-10-CM | POA: Insufficient documentation

## 2022-11-04 MED ORDER — HYDROCODONE BIT-HOMATROP MBR 5-1.5 MG/5ML PO SOLN: 5.0000 mL | Freq: Three times a day (TID) | ORAL | 0 refills | Status: DC | PRN

## 2022-11-04 MED ORDER — LEVOFLOXACIN 500 MG PO TABS
500.0000 mg | ORAL_TABLET | Freq: Every day | ORAL | 0 refills | Status: DC
  Filled 2022-11-04: qty 5, 5d supply, fill #0

## 2022-11-04 NOTE — Telephone Encounter (Signed)
Summary: Cough, congestion, sore throat, fever Advice   Pt is calling to report that she has been sick Wednesday last week with coughing, congestion, sore throat, headache, fever. No appts at office or with on call provider. Pt is open virtual appt.         Chief Complaint: covid like sx has not tested for covid.  Symptoms: shortness of breath with coughing. Non productive. Reports sounds congested. Sore throat, fever 101.1 this am at 5. Feeling tired  Frequency: 10/30/22 Pertinent Negatives: Patient denies chest pain no difficulty breathing  Disposition: [] ED /[] Urgent Care (no appt availability in office) / [x] Appointment(In office/virtual)/ []  [facility] [facility]/ [] Home Care/ [] Refused Recommended Disposition /[] [place]/ []  Follow-up with PCP Additional Notes:   Scheduled appt today . Recommended to wear mask to appt. Recommended if sx worsen go to ED.          Reason for Disposition  MILD difficulty breathing (e.g., minimal/no SOB at rest, SOB with walking, pulse <100)  Answer Assessment - Initial Assessment Questions 1. COVID-19 DIAGNOSIS: "How do you know that you have COVID?" (e.g., positive lab test or self-test, diagnosed by doctor or NP/PA, symptoms after exposure).     Has not tested for covid no at home tests available  2. COVID-19 EXPOSURE: "Was there any known exposure to COVID before the symptoms began?" CDC Definition of close contact: within 6 feet (2 meters) for a total of 15 minutes or more over a 24-hour period.      na 3. ONSET: "When did the COVID-19 symptoms start?"      Wednesday 10/30/22  4. WORST SYMPTOM: "What is your worst symptom?" (e.g., cough, fever, shortness of breath, muscle aches)     Cough , sore throat , congestion, fever 5. COUGH: "Do you have a cough?" If Yes, ask: "How bad is the cough?"       Yes non productive  6. FEVER: "Do you have a fever?" If Yes, ask: "What is your temperature, how was it measured, and when did it  start?"     101.1 this am at 5 7. RESPIRATORY STATUS: "Describe your breathing?" (e.g., normal; shortness of breath, wheezing, unable to speak)     Shortness of breath with coughing  8. BETTER-SAME-WORSE: "Are you getting better, staying the same or getting worse compared to yesterday?"  If getting worse, ask, "In what way?"     worse 9. OTHER SYMPTOMS: "Do you have any other symptoms?"  (e.g., chills, fatigue, headache, loss of smell or taste, muscle pain, sore throat)     Feeling tired, cough , congestion, shortness of breath with cough, fever sore throat  10. HIGH RISK DISEASE: "Do you have any chronic medical problems?" (e.g., asthma, heart or lung disease, weak immune system, obesity, etc.)       na 11. VACCINE: "Have you had the COVID-19 vaccine?" If Yes, ask: "Which one, how many shots, when did you get it?"       na 12. PREGNANCY: "Is there any chance you are pregnant?" "When was your last menstrual period?"       na 13. O2 SATURATION MONITOR:  "Do you use an oxygen saturation monitor (pulse oximeter) at home?" If Yes, ask "What is your reading (oxygen level) today?" "What is your usual oxygen saturation reading?" (e.g., 95%)       na  Protocols used: Coronavirus (COVID-19) Diagnosed or Suspected-A-AH

## 2022-11-04 NOTE — Progress Notes (Signed)
I,J'ya E Hunter,acting as a scribe for Jacky KindleElise T Skarlett Sedlacek, FNP.,have documented all relevant documentation on the behalf of Jacky Kindlelise T Elmira Olkowski, FNP,as directed by  Jacky KindleElise T Vuong Musa, FNP while in the presence of Jacky KindleElise T Elener Custodio, FNP.   Established patient visit  Patient: Michaela Jones   DOB: 1976/01/03   47 y.o. Female  MRN: [id number] Visit Date: 11/04/2022  Today's healthcare provider: Jacky KindleElise T Renika Shiflet, FNP  Introduced to nurse practitioner role and practice setting.  All questions answered.  Discussed provider/patient relationship and expectations.  Subjective    HPI  Upper Respiratory Infection: Patient complains of symptoms of a URI. Symptoms include congestion, cough, diarrhea, fever, sore throat, and swollen glands. Onset of symptoms was 5 days ago, gradually worsening since that time. She also c/o achiness, congestion, facial pain, headache described as pressure, lightheadedness, low grade fever, nasal congestion, and post nasal drip for the past 5 days .  She is drinking plenty of fluids. Evaluation to date: none. Treatment to date: antihistamines, cough suppressants, and decongestants.     Medications: Outpatient Medications Prior to Visit  Medication Sig   albuterol (PROVENTIL) (5 MG/ML) 0.5% nebulizer solution Inhale into the lungs.   albuterol (VENTOLIN HFA) 108 (90 Base) MCG/ACT inhaler Inhale 2 puffs into the lungs every 6 (six) hours as needed for wheezing or shortness of breath.   albuterol (VENTOLIN HFA) 108 (90 Base) MCG/ACT inhaler Inhale 2 puffs into the lungs every 6 (six) hours as needed for wheezing or shortness of breath.   ALPRAZolam (XANAX) 1 MG tablet Take 0.5-1 tablets (0.5-1 mg total) by mouth 3 (three) times daily as needed for anxiety.   apixaban (ELIQUIS) 5 MG TABS tablet Take 1 tablet (5 mg total) by mouth 2 (two) times daily.   Armodafinil 150 MG tablet Take 1 tablet (150 mg total) by mouth every morning.   cetirizine (ZYRTEC) 10 MG tablet Take 10 mg by mouth daily as  needed for allergies.   chlorhexidine (PERIDEX) 0.12 % solution RINSE MOUTH WITH 15ML (1 CAPFUL) FOR 30 SECONDS AM AND PM AFTER TOOTHBRUSHING. EXPECTORATE AFTER RINSING, DO NOT SWALLOW   chlorhexidine (PERIDEX) 0.12 % solution RINSE MOUTH WITH 15ML (1 CAPFUL) FOR 30 SECONDS AM AND PM AFTER TOOTHBRUSHING. EXPECTORATE AFTER RINSING, DO NOT SWALLOW   cholecalciferol (VITAMIN D3) 25 MCG (1000 UNIT) tablet Take by mouth.   cyanocobalamin (VITAMIN B12) 1000 MCG/ML injection INJECT 1 MILLILITER (ML) EVERY TWO WEEKS FOR THREE MONTHS THEN ONCE A  MONTH   fluticasone (FLONASE) 50 MCG/ACT nasal spray Place 2 sprays into both nostrils daily. (Patient taking differently: Place 2 sprays into both nostrils daily as needed.)   HYDROcodone-acetaminophen (NORCO) 10-325 MG tablet Take 1 tablet by mouth 4 (four) times daily as needed for severe pain.   Insulin Pen Needle (NOVOFINE PEN NEEDLE) 32G X 6 MM MISC Use daily with saxenda.   levothyroxine (SYNTHROID) 150 MCG tablet Take 1 tablet (150 mcg total) by mouth daily. Please schedule an office visit before anymore refills.   Multiple Vitamin (MULTIVITAMIN) tablet Take 1 tablet by mouth daily.   promethazine (PHENERGAN) 25 MG tablet Take 1 tablet (25 mg total) by mouth every 8 (eight) hours as needed for nausea or vomiting.   Rimegepant Sulfate (NURTEC) 75 MG TBDP Take 1 tablet (75 mg total) by mouth every other day.   Suvorexant (BELSOMRA) 20 MG TABS Take 20 mg by mouth at bedtime.   venlafaxine XR (EFFEXOR XR) 37.5 MG 24 hr capsule Take 1  capsule (37.5 mg total) by mouth daily with breakfast.   [DISCONTINUED] chlorpheniramine-HYDROcodone (TUSSIONEX) 10-8 MG/5ML Take 5 mLs by mouth every 12 (twelve) hours as needed for cough.   No facility-administered medications prior to visit.    Review of Systems  Constitutional:  Positive for fatigue and fever.  HENT:  Positive for congestion, postnasal drip, rhinorrhea, sore throat, trouble swallowing and voice change.    Respiratory:  Positive for cough, chest tightness and shortness of breath (from cough).   Gastrointestinal:  Positive for abdominal pain and nausea.  Neurological:  Positive for weakness and headaches.     Objective    BP 115/76 (BP Location: Right Arm, Patient Position: Sitting, Cuff Size: Large)   Pulse 89   Temp 98.5 F (36.9 C) (Oral)   Ht 5\' 6"  (1.676 m)   Wt 239 lb 14.4 oz (108.8 kg)   SpO2 99%   BMI 38.72 kg/m   Physical Exam Vitals and nursing note reviewed.  Constitutional:      General: She is not in acute distress.    Appearance: Normal appearance. She is obese. She is not ill-appearing, toxic-appearing or diaphoretic.  HENT:     Head: Normocephalic and atraumatic.     Right Ear: Tympanic membrane, ear canal and external ear normal.     Left Ear: Hearing, ear canal and external ear normal. Swelling and tenderness present.     Nose: Congestion present.     Mouth/Throat:     Mouth: Mucous membranes are moist.     Pharynx: Oropharynx is clear. Posterior oropharyngeal erythema present. No oropharyngeal exudate.  Eyes:     Extraocular Movements: Extraocular movements intact.     Conjunctiva/sclera: Conjunctivae normal.     Pupils: Pupils are equal, round, and reactive to light.  Cardiovascular:     Rate and Rhythm: Normal rate and regular rhythm.     Pulses: Normal pulses.     Heart sounds: Normal heart sounds. No murmur heard.    No friction rub. No gallop.  Pulmonary:     Effort: Pulmonary effort is normal. No respiratory distress.     Breath sounds: Normal breath sounds. No stridor. No wheezing, rhonchi or rales.  Chest:     Chest wall: No tenderness.  Abdominal:     General: Bowel sounds are normal.     Palpations: Abdomen is soft.  Musculoskeletal:        General: No swelling, tenderness, deformity or signs of injury. Normal range of motion.     Cervical back: Normal range of motion and neck supple.     Right lower leg: No edema.     Left lower leg: No  edema.  Skin:    General: Skin is warm and dry.     Capillary Refill: Capillary refill takes less than 2 seconds.     Coloration: Skin is not jaundiced or pale.     Findings: No bruising, erythema, lesion or rash.  Neurological:     General: No focal deficit present.     Mental Status: She is alert and oriented to person, place, and time. Mental status is at baseline.     Cranial Nerves: No cranial nerve deficit.     Sensory: No sensory deficit.     Motor: No weakness.     Coordination: Coordination normal.  Psychiatric:        Mood and Affect: Mood normal.        Behavior: Behavior normal.  Thought Content: Thought content normal.        Judgment: Judgment normal.      No results found for any visits on 11/04/22.  Assessment & Plan     Problem List Items Addressed This Visit       Nervous and Auditory   Non-recurrent acute suppurative otitis media of left ear without spontaneous rupture of tympanic membrane - Primary    Acute, with low grade fevers 101 Recommend use of ABX to assist generalized URI complaints previously stable with use of zyrtec, PRN inhalers, and OTC cough medication Continue supportive care; work note provided       Relevant Medications   levofloxacin (LEVAQUIN) 500 MG tablet     Other   Acute cough    Self limiting; request for cough syrup to assist       Return if symptoms worsen or fail to improve.     Leilani Merl, FNP, have reviewed all documentation for this visit. The documentation on 11/04/22 for the exam, diagnosis, procedures, and orders are all accurate and complete.  Jacky Kindle, FNP  [facility] [phone] (phone) [phone] (fax)  [facility]

## 2022-11-04 NOTE — Assessment & Plan Note (Signed)
Acute, with low grade fevers 101 Recommend use of ABX to assist generalized URI complaints previously stable with use of zyrtec, PRN inhalers, and OTC cough medication Continue supportive care; work note provided

## 2022-11-04 NOTE — Telephone Encounter (Signed)
Copied from CRM [id number]. Topic: General - Other >> Nov 04, 2022  4:12 PM Michaela Jones wrote: Reason for CRM: Pt states that she seen Merita Norton today at 3pm and is wanting to make sure Robynn Pane will remember to put her note for work in her mychart today. Pt states that she will be written out of work until Wednesday and is needing to send her employer the note today. Please advise

## 2022-11-04 NOTE — Assessment & Plan Note (Signed)
Self limiting; request for cough syrup to assist

## 2022-11-04 NOTE — Patient Instructions (Signed)
Some things that can make you feel better are: - Increased rest - Increasing Fluids - Acetaminophen / ibuprofen as needed for fever/pain.  - Salt water gargling, chloraseptic spray and throat lozenges - OTC pseudoephedrine.  - Mucinex.  - Saline sinus flushes or a neti pot.  - Humidifying the air.  

## 2022-11-06 ENCOUNTER — Ambulatory Visit: Payer: BC Managed Care – PPO | Admitting: Family Medicine

## 2022-11-08 ENCOUNTER — Ambulatory Visit: Payer: BC Managed Care – PPO | Admitting: Physician Assistant

## 2022-11-08 NOTE — Progress Notes (Unsigned)
I,Joseline E Rosas,acting as a scribe for [employer], MD.,have documented all relevant documentation on the behalf of Ronnald Ramp, MD,as directed by  Ronnald Ramp, MD while in the presence of Ronnald Ramp, MD.   Established patient visit   Patient: Michaela Jones   DOB: 1975/12/28   47 y.o. Female  MRN: [id number] Visit Date: 11/11/2022  Today's healthcare provider: Ronnald Ramp, MD   Chief Complaint  Patient presents with   Back Pain   Subjective    HPI HPI     Back Pain   This is a chronic problem.  There was an injury that may have caused the pain.  Onset: Worsening in the past few weeks.  The problem has been gradually worsening since onset.  Pain is lumbar spine.  The pain radiates to left knee and left thigh.  The quality of pain is described as sharp (shooting).  Severity of the pain is severe.  Pain occurs constantly.  The symptoms are aggravated by lying down, position, sitting, standing and walking.  Symptoms are relieved by nothing.  Abdominal Pain: Absent.  Bowel incontinence: Absent.  Chest pain: Absent.  Dysuria: Absent.  Fever: Absent.  Headaches: Absent.  Joint pains: Absent.  Weakness in leg: Numbness.  Pelvic pain: Absent.  Tingling in lower extremities: Present ("Some").  Urinary incontinence: Absent.  Weight loss: Absent.      Last edited by Marjie Skiff, CMA on 11/11/2022  2:04 PM.         Medications: Outpatient Medications Prior to Visit  Medication Sig   albuterol (PROVENTIL) (5 MG/ML) 0.5% nebulizer solution Inhale into the lungs.   albuterol (VENTOLIN HFA) 108 (90 Base) MCG/ACT inhaler Inhale 2 puffs into the lungs every 6 (six) hours as needed for wheezing or shortness of breath.   albuterol (VENTOLIN HFA) 108 (90 Base) MCG/ACT inhaler Inhale 2 puffs into the lungs every 6 (six) hours as needed for wheezing or shortness of breath.   ALPRAZolam (XANAX) 1 MG tablet Take 0.5-1 tablets  (0.5-1 mg total) by mouth 3 (three) times daily as needed for anxiety.   apixaban (ELIQUIS) 5 MG TABS tablet Take 1 tablet (5 mg total) by mouth 2 (two) times daily.   Armodafinil 150 MG tablet Take 1 tablet (150 mg total) by mouth every morning.   cetirizine (ZYRTEC) 10 MG tablet Take 10 mg by mouth daily as needed for allergies.   chlorhexidine (PERIDEX) 0.12 % solution RINSE MOUTH WITH (1 CAPFUL) FOR 30 SECONDS AM AND PM AFTER TOOTHBRUSHING. EXPECTORATE AFTER RINSING, DO NOT SWALLOW   cholecalciferol (VITAMIN D3) 25 MCG (1000 UNIT) tablet Take by mouth.   cyanocobalamin (VITAMIN B12) 1000 MCG/ML injection INJECT 1 MILLILITER (ML) EVERY TWO WEEKS FOR THREE MONTHS THEN ONCE A  MONTH   fluticasone (FLONASE) 50 MCG/ACT nasal spray Place 2 sprays into both nostrils daily. (Patient taking differently: Place 2 sprays into both nostrils daily as needed.)   HYDROcodone-acetaminophen (NORCO) 10-325 MG tablet Take 1 tablet by mouth 4 (four) times daily as needed for severe pain.   levofloxacin (LEVAQUIN) 500 MG tablet Take 1 tablet (500 mg total) by mouth daily.   levothyroxine (SYNTHROID) 150 MCG tablet Take 1 tablet (150 mcg total) by mouth daily. Please schedule an office visit before anymore refills.   Multiple Vitamin (MULTIVITAMIN) tablet Take 1 tablet by mouth daily.   promethazine (PHENERGAN) 25 MG tablet Take 1 tablet (25 mg total) by mouth every 8 (eight) hours as needed  for nausea or vomiting.   Rimegepant Sulfate (NURTEC) 75 MG TBDP Take 1 tablet (75 mg total) by mouth every other day.   Suvorexant (BELSOMRA) 20 MG TABS Take 20 mg by mouth at bedtime.   chlorhexidine (PERIDEX) 0.12 % solution RINSE MOUTH WITH (1 CAPFUL) FOR 30 SECONDS AM AND PM AFTER TOOTHBRUSHING. EXPECTORATE AFTER RINSING, DO NOT SWALLOW   HYDROcodone bit-homatropine (HYCODAN) 5-1.5 MG/5ML syrup Take 5 mLs by mouth every 8 (eight) hours as needed for cough. (Patient not taking: Reported on 11/11/2022)   Insulin Pen  Needle (NOVOFINE PEN NEEDLE) 32G X 6 MM MISC Use daily with saxenda.   venlafaxine XR (EFFEXOR XR) 37.5 MG 24 hr capsule Take 1 capsule (37.5 mg total) by mouth daily with breakfast.   No facility-administered medications prior to visit.    Review of Systems     Objective    BP 118/72 (BP Location: Left Arm, Patient Position: Sitting, Cuff Size: Large)   Pulse 89   Temp 98.6 F (37 C) (Oral)   Resp 16   Ht  (1.676 m)   Wt 245 lb (111.1 kg)   BMI 39.54 kg/m    Physical Exam Musculoskeletal:     Right lower leg: Swelling present.     Left lower leg: Swelling and tenderness present.     Comments: 5/5 strength In bilateral lower extremities  No pain with hip ROM exercises  +SLR on left  Patient is able to flex spine to about 60degrees but reports tenderness with motion  Tenderness to palpation of paraspinal muscles, left gluteal region and left lateral thigh        No results found for any visits on 11/11/22.  Assessment & Plan     Problem List Items Addressed This Visit       Nervous and Auditory   Sciatica of left side - Primary    Acute new pain involving patient's left lower extremity Will treat as sciatica She will take prednisone 20 mg daily for 7 days  Patient advised to follow-up in 10 days if no improvement Patient with concern for DVT given her history of DVT We will order vascular ultrasound to assess for DVT      Other Visit Diagnoses     Acute leg pain, left       Relevant Orders   VAS [place] LOWER EXTREMITY VENOUS (DVT)        Return in about 10 days (around 11/21/2022), or if symptoms worsen or fail to improve.        The entirety of the information documented in the History of Present Illness, Review of Systems and Physical Exam were personally obtained by me. Portions of this information were initially documented by Hetty Ely, CMA . I, Ronnald Ramp, MD have reviewed the documentation above for thoroughness and  accuracy.      Ronnald Ramp, MD  [facility] [phone] (phone) [phone] (fax)  [facility]

## 2022-11-11 ENCOUNTER — Other Ambulatory Visit: Payer: Self-pay

## 2022-11-11 ENCOUNTER — Ambulatory Visit: Payer: BC Managed Care – PPO | Admitting: Family Medicine

## 2022-11-11 ENCOUNTER — Encounter: Payer: Self-pay | Admitting: Family Medicine

## 2022-11-11 VITALS — BP 118/72 | HR 89 | Temp 98.6°F | Resp 16 | Ht 66.0 in | Wt 245.0 lb

## 2022-11-11 DIAGNOSIS — M79605 Pain in left leg: Secondary | ICD-10-CM

## 2022-11-11 DIAGNOSIS — M5432 Sciatica, left side: Secondary | ICD-10-CM

## 2022-11-11 MED ORDER — PREDNISONE 20 MG PO TABS
20.0000 mg | ORAL_TABLET | Freq: Every day | ORAL | 0 refills | Status: AC
  Filled 2022-11-11: qty 7, 7d supply, fill #0

## 2022-11-11 NOTE — Assessment & Plan Note (Signed)
Acute new pain involving patient's left lower extremity Will treat as sciatica She will take prednisone 20 mg daily for 7 days  Patient advised to follow-up in 10 days if no improvement Patient with concern for DVT given her history of DVT We will order vascular ultrasound to assess for DVT

## 2022-11-11 NOTE — Patient Instructions (Signed)
I have prescribed prednisone 20 mg daily for the next week.  Your pain appears to be related to sciatica so we will treat with prednisone to calm down the inflammation.  I have also ordered an ultrasound of your legs to assess for any blood clots.  Someone from the imaging center will be in touch with you to schedule this appointment on your preferred date.

## 2022-11-15 ENCOUNTER — Encounter: Payer: Self-pay | Admitting: Oncology

## 2022-11-15 ENCOUNTER — Emergency Department: Payer: No Typology Code available for payment source

## 2022-11-15 ENCOUNTER — Emergency Department
Admission: EM | Admit: 2022-11-15 | Discharge: 2022-11-16 | Disposition: A | Discharge status: Home / Self Care | Payer: No Typology Code available for payment source | Attending: Emergency Medicine | Admitting: Emergency Medicine

## 2022-11-15 ENCOUNTER — Emergency Department: Payer: BC Managed Care – PPO

## 2022-11-15 ENCOUNTER — Other Ambulatory Visit: Payer: Self-pay

## 2022-11-15 DIAGNOSIS — M545 Low back pain, unspecified: Secondary | ICD-10-CM | POA: Insufficient documentation

## 2022-11-15 DIAGNOSIS — E039 Hypothyroidism, unspecified: Secondary | ICD-10-CM | POA: Diagnosis not present

## 2022-11-15 DIAGNOSIS — R110 Nausea: Secondary | ICD-10-CM | POA: Insufficient documentation

## 2022-11-15 DIAGNOSIS — R519 Headache, unspecified: Secondary | ICD-10-CM | POA: Insufficient documentation

## 2022-11-15 MED ORDER — OXYCODONE-ACETAMINOPHEN 5-325 MG PO TABS
2.0000 | ORAL_TABLET | Freq: Once | ORAL | Status: AC
  Administered 2022-11-16: 2 via ORAL
  Filled 2022-11-15: qty 2
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
## 2022-11-17 ENCOUNTER — Other Ambulatory Visit: Payer: Self-pay | Admitting: Family Medicine

## 2022-11-17 ENCOUNTER — Other Ambulatory Visit: Payer: Self-pay

## 2022-11-17 DIAGNOSIS — I82411 Acute embolism and thrombosis of right femoral vein: Secondary | ICD-10-CM

## 2022-11-17 MED ORDER — APIXABAN 5 MG PO TABS
5.0000 mg | ORAL_TABLET | Freq: Two times a day (BID) | ORAL | 11 refills | Status: AC
Start: 2022-11-17 — End: ?
  Filled 2022-11-17 – 2022-11-19 (×2): qty 60, 30d supply, fill #0
  Filled 2022-12-16: qty 60, 30d supply, fill #1
  Filled 2023-01-24: qty 60, 30d supply, fill #2
  Filled 2023-03-16: qty 60, 30d supply, fill #3
  Filled 2023-04-17 – 2023-07-08 (×2): qty 60, 30d supply, fill #4
  Filled 2023-11-12: qty 60, 30d supply, fill #5
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
## 2022-11-19 ENCOUNTER — Other Ambulatory Visit: Payer: Self-pay

## 2022-11-19 ENCOUNTER — Telehealth (INDEPENDENT_AMBULATORY_CARE_PROVIDER_SITE_OTHER): Payer: BC Managed Care – PPO | Admitting: Family Medicine

## 2022-11-19 DIAGNOSIS — M961 Postlaminectomy syndrome, not elsewhere classified: Secondary | ICD-10-CM

## 2022-11-19 DIAGNOSIS — F1190 Opioid use, unspecified, uncomplicated: Secondary | ICD-10-CM | POA: Diagnosis not present

## 2022-11-19 MED ORDER — HYDROCODONE-ACETAMINOPHEN 10-325 MG PO TABS
1.0000 | ORAL_TABLET | Freq: Four times a day (QID) | ORAL | 0 refills | Status: DC | PRN
Start: 2022-11-19 — End: 2022-12-04
  Filled 2022-11-19 – 2022-11-22 (×3): qty 100, 25d supply, fill #0

## 2022-11-19 MED ORDER — OXYCODONE-ACETAMINOPHEN 5-325 MG PO TABS
1.0000 | ORAL_TABLET | Freq: Four times a day (QID) | ORAL | 0 refills | Status: DC | PRN
  Filled 2022-11-19: qty 7, 2d supply, fill #0

## 2022-11-20 ENCOUNTER — Other Ambulatory Visit: Payer: Self-pay

## 2022-11-21 ENCOUNTER — Other Ambulatory Visit: Payer: Self-pay

## 2022-11-22 ENCOUNTER — Other Ambulatory Visit: Payer: Self-pay

## 2022-11-22 ENCOUNTER — Encounter: Payer: Self-pay | Admitting: Oncology

## 2022-11-27 ENCOUNTER — Other Ambulatory Visit: Payer: Self-pay

## 2022-12-04 ENCOUNTER — Encounter: Payer: Self-pay | Admitting: Family Medicine

## 2022-12-04 ENCOUNTER — Other Ambulatory Visit: Payer: Self-pay

## 2022-12-04 ENCOUNTER — Ambulatory Visit (INDEPENDENT_AMBULATORY_CARE_PROVIDER_SITE_OTHER): Payer: BC Managed Care – PPO | Admitting: Family Medicine

## 2022-12-04 VITALS — BP 93/58 | HR 90 | Temp 97.8°F | Resp 12 | Wt 238.0 lb

## 2022-12-04 DIAGNOSIS — J301 Allergic rhinitis due to pollen: Secondary | ICD-10-CM | POA: Diagnosis not present

## 2022-12-04 DIAGNOSIS — J00 Acute nasopharyngitis [common cold]: Secondary | ICD-10-CM | POA: Insufficient documentation

## 2022-12-04 MED ORDER — AZELASTINE-FLUTICASONE 137-50 MCG/ACT NA SUSP
1.0000 | Freq: Two times a day (BID) | NASAL | 0 refills | Status: DC
Start: 2022-12-04 — End: 2023-03-03
  Filled 2022-12-04: qty 23, 30d supply, fill #0

## 2022-12-04 MED ORDER — GUAIFENESIN-DM 100-10 MG/5ML PO SYRP
5.0000 mL | ORAL_SOLUTION | ORAL | 0 refills | Status: DC | PRN
Start: 2022-12-04 — End: 2023-04-17
  Filled 2022-12-04: qty 118, 4d supply, fill #0

## 2022-12-04 MED ORDER — ONDANSETRON HCL 4 MG PO TABS
4.0000 mg | ORAL_TABLET | Freq: Three times a day (TID) | ORAL | 0 refills | Status: DC | PRN
  Filled 2022-12-04: qty 18, 21d supply, fill #0

## 2022-12-04 MED ORDER — MONTELUKAST SODIUM 10 MG PO TABS
10.0000 mg | ORAL_TABLET | Freq: Every day | ORAL | 3 refills | Status: DC
Start: 2022-12-04 — End: 2023-05-08
  Filled 2022-12-04: qty 30, 30d supply, fill #0
  Filled 2022-12-16 – 2022-12-19 (×2): qty 30, 30d supply, fill #1
  Filled 2023-03-02: qty 30, 30d supply, fill #2
  Filled 2023-03-16 – 2023-04-09 (×2): qty 30, 30d supply, fill #3

## 2022-12-04 NOTE — Assessment & Plan Note (Signed)
Chronic, stable Body mass index is 38.41 kg/m. HDL, IDA, Anxiety Wishes to restart saxenda; recommend conversation with PCP regarding next steps given hx of WLS

## 2022-12-04 NOTE — Progress Notes (Signed)
I,Sulibeya S Dimas,acting as a Neurosurgeon for Jacky Kindle, FNP.,have documented all relevant documentation on the behalf of Jacky Kindle, FNP,as directed by  Jacky Kindle, FNP while in the presence of Jacky Kindle, FNP.   Established patient visit  Patient: Michaela Jones   DOB: [birth year]   47 y.o. Female  MRN: [id number] Visit Date: 12/04/2022  Today's healthcare provider: Jacky Kindle, FNP  Introduced to nurse practitioner role and practice setting.  All questions answered.  Discussed provider/patient relationship and expectations.  Chief Complaint  Patient presents with   URI   Subjective    HPI  Upper respiratory symptoms She complains of right ear pressure/pain, congestion, nausea with vomiting, post nasal drip, productive cough with  yellow colored sputum, sinus pressure, and wheezing.with no fever, chills, night sweats or weight loss. Onset of symptoms was a few days ago and gradually worsening.She is drinking plenty of fluids.  Past history is significant for no history of pneumonia or bronchitis. Patient is non-smoker  ---------------------------------------------------------------------------------------------------  Recent use of levaquin and pred burst.  Medications: Outpatient Medications Prior to Visit  Medication Sig   albuterol (VENTOLIN HFA) 108 (90 Base) MCG/ACT inhaler Inhale 2 puffs into the lungs every 6 (six) hours as needed for wheezing or shortness of breath.   ALPRAZolam (XANAX) 1 MG tablet Take 0.5-1 tablets (0.5-1 mg total) by mouth 3 (three) times daily as needed for anxiety.   apixaban (ELIQUIS) 5 MG TABS tablet Take 1 tablet (5 mg total) by mouth 2 (two) times daily.   cetirizine (ZYRTEC) 10 MG tablet Take 10 mg by mouth daily as needed for allergies.   cholecalciferol (VITAMIN D3) 25 MCG (1000 UNIT) tablet Take by mouth.   cyanocobalamin (VITAMIN B12) 1000 MCG/ML injection INJECT 1 MILLILITER (ML) EVERY TWO WEEKS FOR THREE MONTHS THEN ONCE A  MONTH    levothyroxine (SYNTHROID) 150 MCG tablet Take 1 tablet (150 mcg total) by mouth daily. Please schedule an office visit before anymore refills.   Multiple Vitamin (MULTIVITAMIN) tablet Take 1 tablet by mouth daily.   promethazine (PHENERGAN) 25 MG tablet Take 1 tablet (25 mg total) by mouth every 8 (eight) hours as needed for nausea or vomiting.   Rimegepant Sulfate (NURTEC) 75 MG TBDP Take 1 tablet (75 mg total) by mouth every other day.   Suvorexant (BELSOMRA) 20 MG TABS Take 20 mg by mouth at bedtime.   [DISCONTINUED] chlorhexidine (PERIDEX) 0.12 % solution RINSE MOUTH WITH (1 CAPFUL) FOR 30 SECONDS AM AND PM AFTER TOOTHBRUSHING. EXPECTORATE AFTER RINSING, DO NOT SWALLOW   [DISCONTINUED] fluticasone (FLONASE) 50 MCG/ACT nasal spray Place 2 sprays into both nostrils daily. (Patient taking differently: Place 2 sprays into both nostrils daily as needed.)   [DISCONTINUED] HYDROcodone-acetaminophen (NORCO) 10-325 MG tablet Take 1 tablet by mouth 4 (four) times daily as needed for severe pain. Please place prescription on hold until patient calls for refill   [DISCONTINUED] levofloxacin (LEVAQUIN) 500 MG tablet Take 1 tablet (500 mg total) by mouth daily.   [DISCONTINUED] albuterol (PROVENTIL) (5 MG/ML) 0.5% nebulizer solution Inhale into the lungs.   [DISCONTINUED] albuterol (VENTOLIN HFA) 108 (90 Base) MCG/ACT inhaler Inhale 2 puffs into the lungs every 6 (six) hours as needed for wheezing or shortness of breath.   [DISCONTINUED] Armodafinil 150 MG tablet Take 1 tablet (150 mg total) by mouth every morning.   [DISCONTINUED] chlorhexidine (PERIDEX) 0.12 % solution RINSE MOUTH WITH (1 CAPFUL) FOR 30 SECONDS AM AND PM  AFTER TOOTHBRUSHING. EXPECTORATE AFTER RINSING, DO NOT SWALLOW   [DISCONTINUED] HYDROcodone bit-homatropine (HYCODAN) 5-1.5 MG/5ML syrup Take 5 mLs by mouth every 8 (eight) hours as needed for cough.   [DISCONTINUED] Insulin Pen Needle (NOVOFINE PEN NEEDLE) 32G X 6 MM MISC Use daily  with saxenda.   [DISCONTINUED] oxyCODONE-acetaminophen (PERCOCET) 5-325 MG tablet Take 1 tablet by mouth every 6 (six) hours as needed.   [DISCONTINUED] venlafaxine XR (EFFEXOR XR) 37.5 MG 24 hr capsule Take 1 capsule (37.5 mg total) by mouth daily with breakfast. (Patient not taking: Reported on 12/04/2022)   No facility-administered medications prior to visit.    Review of Systems  Constitutional:  Negative for chills and fever.  HENT:  Positive for congestion, ear discharge, ear pain, postnasal drip, sinus pressure, sinus pain and sore throat. Negative for rhinorrhea.   Respiratory:  Positive for cough, chest tightness and wheezing. Negative for shortness of breath.   Gastrointestinal:  Positive for abdominal pain, nausea and vomiting.  Allergic/Immunologic: Positive for environmental allergies.       Objective    BP (!) 93/58 (BP Location: Right Arm, Patient Position: Sitting, Cuff Size: Large)   Pulse 90   Temp 97.8 F (36.6 C) (Temporal)   Resp 12   Wt 238 lb (108 kg)   SpO2 98%   BMI 38.41 kg/m  BP Readings from Last 3 Encounters:  12/04/22 (!) 93/58  11/11/22 118/72  11/04/22 115/76   Wt Readings from Last 3 Encounters:  12/04/22 238 lb (108 kg)  11/11/22 245 lb (111.1 kg)  11/04/22 239 lb 14.4 oz (108.8 kg)   Physical Exam Vitals and nursing note reviewed.  Constitutional:      General: She is not in acute distress.    Appearance: Normal appearance. She is obese. She is not ill-appearing, toxic-appearing or diaphoretic.  HENT:     Head: Normocephalic and atraumatic.     Right Ear: Tympanic membrane, ear canal and external ear normal.     Left Ear: Tympanic membrane, ear canal and external ear normal.     Nose: Congestion present.     Mouth/Throat:     Mouth: Mucous membranes are moist.     Pharynx: Oropharynx is clear.  Eyes:     Extraocular Movements: Extraocular movements intact.     Conjunctiva/sclera: Conjunctivae normal.     Pupils: Pupils are equal,  round, and reactive to light.  Cardiovascular:     Rate and Rhythm: Normal rate and regular rhythm.     Pulses: Normal pulses.     Heart sounds: Normal heart sounds. No murmur heard.    No friction rub. No gallop.  Pulmonary:     Effort: Pulmonary effort is normal. No respiratory distress.     Breath sounds: Normal breath sounds. No stridor. No wheezing, rhonchi or rales.  Chest:     Chest wall: No tenderness.  Abdominal:     General: Bowel sounds are normal.     Palpations: Abdomen is soft.     Tenderness: There is no abdominal tenderness. There is no guarding.     Comments: GI looseness likely in setting of swallowed sputum and rhinorrhea   Musculoskeletal:        General: No swelling, tenderness, deformity or signs of injury. Normal range of motion.     Right lower leg: No edema.     Left lower leg: No edema.  Skin:    General: Skin is warm and dry.     Capillary Refill: Capillary  refill takes less than 2 seconds.     Coloration: Skin is not jaundiced or pale.     Findings: No bruising, erythema, lesion or rash.  Neurological:     General: No focal deficit present.     Mental Status: She is alert and oriented to person, place, and time. Mental status is at baseline.     Cranial Nerves: No cranial nerve deficit.     Sensory: No sensory deficit.     Motor: No weakness.     Coordination: Coordination normal.  Psychiatric:        Mood and Affect: Mood is anxious.        Behavior: Behavior normal.        Thought Content: Thought content normal.        Judgment: Judgment normal.    No results found for any visits on 12/04/22.  Assessment & Plan     Problem List Items Addressed This Visit       Respiratory   Allergic rhinitis - Primary    Acute, ongoing Works in school Non febrile today Continue supportive care Add nasal spray and cough syrup Add zofran for nausea Add singulair for mucous assistance       Relevant Medications   Azelastine-Fluticasone 137-50  MCG/ACT SUSP   montelukast (SINGULAIR) 10 MG tablet   guaiFENesin-dextromethorphan (ROBITUSSIN DM) 100-10 MG/5ML syrup   ondansetron (ZOFRAN) 4 MG tablet     Other   Morbid obesity (HCC)    Chronic, stable Body mass index is 38.41 kg/m. HDL, IDA, Anxiety Wishes to restart saxenda; recommend conversation with PCP regarding next steps given hx of WLS      Return if symptoms worsen or fail to improve.     Leilani Merl, FNP, have reviewed all documentation for this visit. The documentation on 12/04/22 for the exam, diagnosis, procedures, and orders are all accurate and complete.  Jacky Kindle, FNP  [facility] [phone] (phone) [phone] (fax)  [facility]

## 2022-12-04 NOTE — Assessment & Plan Note (Signed)
Acute, ongoing Works in school Non febrile today Continue supportive care Add nasal spray and cough syrup Add zofran for nausea Add singulair for mucous assistance

## 2022-12-05 ENCOUNTER — Other Ambulatory Visit: Payer: Self-pay

## 2022-12-09 ENCOUNTER — Other Ambulatory Visit: Payer: Self-pay

## 2022-12-09 ENCOUNTER — Telehealth (INDEPENDENT_AMBULATORY_CARE_PROVIDER_SITE_OTHER): Payer: BC Managed Care – PPO | Admitting: Family Medicine

## 2022-12-09 VITALS — Temp 101.2°F

## 2022-12-09 DIAGNOSIS — R051 Acute cough: Secondary | ICD-10-CM | POA: Diagnosis not present

## 2022-12-09 DIAGNOSIS — J0110 Acute frontal sinusitis, unspecified: Secondary | ICD-10-CM

## 2022-12-09 MED ORDER — ALBUTEROL SULFATE HFA 108 (90 BASE) MCG/ACT IN AERS
2.0000 | INHALATION_SPRAY | Freq: Four times a day (QID) | RESPIRATORY_TRACT | 1 refills | Status: DC | PRN
  Filled 2022-12-09: qty 6.7, 25d supply, fill #0
  Filled 2023-04-17: qty 6.7, 25d supply, fill #1

## 2022-12-09 MED ORDER — DOXYCYCLINE HYCLATE 100 MG PO TABS
100.0000 mg | ORAL_TABLET | Freq: Two times a day (BID) | ORAL | 0 refills | Status: AC
Start: 2022-12-09 — End: 2022-12-19
  Filled 2022-12-09: qty 20, 10d supply, fill #0

## 2022-12-09 NOTE — Patient Instructions (Signed)
.   Please review the attached list of medications and notify my office if there are any errors.   . Please bring all of your medications to every appointment so we can make sure that our medication list is the same as yours.   

## 2022-12-09 NOTE — Progress Notes (Signed)
I,Michaela Jones,acting as a Neurosurgeon for Michaela Merry, MD.,have documented all relevant documentation on the behalf of Michaela Merry, MD,as directed by  Michaela Merry, MD   MyChart Video Visit    Virtual Visit via Video Note   This format is felt to be most appropriate for this patient at this time. Physical exam was limited by quality of the video and audio technology used for the visit.   Patient location: [facility] Provider location: [facility]  I discussed the limitations of evaluation and management by telemedicine and the availability of in person appointments. The patient expressed understanding and agreed to proceed.  Patient: Michaela Jones   DOB: [birth year]   47 y.o. Female  MRN: [id number] Visit Date: 12/09/2022  Today's healthcare provider: [name], MD   No chief complaint on file.  Subjective    HPI  Patient is being seen due to her concerns that she is still coughing, wheezing and fever after following directions from Wednesday when she was seen by Merita Norton for URI symptoms. She was put on Singulair and Diamist nasal spray in addition to OTC Zyrtec Reports that her cough is getting worse and has been taking robitussin every 4 hours as advised. Feels it is breaking it up but it is not coming up physically and if she does it is chunky and yellow. . Reports that the rest of the symptoms aren't worse but they are just lingering. Patient has noticed she has become more fatigue since not feeling well. Laying down makes cough worse and rx is not helping  -Patient reports taking tylenol 500 mg 2 tablets at 7 am and have been taking every 8 hours. Reports she also takes hydrocodone, gunafensin 600 mg, mucinex and all other rx that were advised. States she continues to have low grade fever and hears wheezing in throat, feels a bit short of breath. States she out of the albuterol inhaler.   Medications: Outpatient Medications Prior to Visit  Medication Sig    albuterol (VENTOLIN HFA) 108 (90 Base) MCG/ACT inhaler Inhale 2 puffs into the lungs every 6 (six) hours as needed for wheezing or shortness of breath.   ALPRAZolam (XANAX) 1 MG tablet Take 0.5-1 tablets (0.5-1 mg total) by mouth 3 (three) times daily as needed for anxiety.   apixaban (ELIQUIS) 5 MG TABS tablet Take 1 tablet (5 mg total) by mouth 2 (two) times daily.   Azelastine-Fluticasone 137-50 MCG/ACT SUSP Place 1 spray into the nose every 12 (twelve) hours.   cetirizine (ZYRTEC) 10 MG tablet Take 10 mg by mouth daily as needed for allergies.   cholecalciferol (VITAMIN D3) 25 MCG (1000 UNIT) tablet Take by mouth.   cyanocobalamin (VITAMIN B12) 1000 MCG/ML injection INJECT 1 MILLILITER (ML) EVERY TWO WEEKS FOR THREE MONTHS THEN ONCE A  MONTH   guaiFENesin-dextromethorphan (ROBITUSSIN DM) 100-10 MG/5ML syrup Take 5 mLs by mouth every 4 (four) hours as needed for cough.   levothyroxine (SYNTHROID) 150 MCG tablet Take 1 tablet (150 mcg total) by mouth daily. Please schedule an office visit before anymore refills.   montelukast (SINGULAIR) 10 MG tablet Take 1 tablet (10 mg total) by mouth at bedtime.   Multiple Vitamin (MULTIVITAMIN) tablet Take 1 tablet by mouth daily.   ondansetron (ZOFRAN) 4 MG tablet Take 1 tablet (4 mg total) by mouth every 8 (eight) hours as needed for nausea or vomiting.   promethazine (PHENERGAN) 25 MG tablet Take 1 tablet (25 mg total) by mouth every 8 (  eight) hours as needed for nausea or vomiting.   Rimegepant Sulfate (NURTEC) 75 MG TBDP Take 1 tablet (75 mg total) by mouth every other day.   Suvorexant (BELSOMRA) 20 MG TABS Take 20 mg by mouth at bedtime.   No facility-administered medications prior to visit.    Review of Systems     Objective         Physical Exam   Awake, alert, oriented x 3. In no apparent distress   Assessment & Plan     1. Acute non-recurrent frontal sinusitis  - doxycycline (VIBRA-TABS) 100 MG tablet; Take 1 tablet (100 mg  total) by mouth 2 (two) times daily for 10 days.  Dispense: 20 tablet; Refill: 0  Work excuse today and tomorrow.   2. Acute cough Continue Robitussin DM  - albuterol (VENTOLIN HFA) 108 (90 Base) MCG/ACT inhaler; Inhale 2 puffs into the lungs every 6 (six) hours as needed for wheezing or shortness of breath.  Dispense: 6.7 g; Refill: 1     I discussed the assessment and treatment plan with the patient. The patient was provided an opportunity to ask questions and all were answered. The patient agreed with the plan and demonstrated an understanding of the instructions.   The patient was advised to call back or seek an in-person evaluation if the symptoms worsen or if the condition fails to improve as anticipated.  I provided 12 minutes of non-face-to-face time during this encounter.  The entirety of the information documented in the History of Present Illness, Review of Systems and Physical Exam were personally obtained by me. Portions of this information were initially documented by the CMA and reviewed by me for thoroughness and accuracy.    Michaela Merry, MD [facility] [phone] (phone) [phone] (fax)  [facility]

## 2022-12-10 ENCOUNTER — Encounter: Payer: Self-pay | Admitting: Oncology

## 2022-12-16 ENCOUNTER — Other Ambulatory Visit: Payer: Self-pay | Admitting: Psychiatry

## 2022-12-16 DIAGNOSIS — F5101 Primary insomnia: Secondary | ICD-10-CM

## 2022-12-16 DIAGNOSIS — F411 Generalized anxiety disorder: Secondary | ICD-10-CM

## 2022-12-16 MED FILL — Promethazine HCl Tab 25 MG: ORAL | 10 days supply | Qty: 30 | Fill #2 | Status: AC

## 2022-12-16 MED FILL — Levothyroxine Sodium Tab 150 MCG: ORAL | 90 days supply | Qty: 90 | Fill #0 | Status: AC

## 2022-12-17 ENCOUNTER — Other Ambulatory Visit: Payer: Self-pay

## 2022-12-19 ENCOUNTER — Encounter: Payer: Self-pay | Admitting: Oncology

## 2022-12-19 ENCOUNTER — Other Ambulatory Visit: Payer: Self-pay

## 2022-12-19 ENCOUNTER — Other Ambulatory Visit: Payer: Self-pay | Admitting: Family Medicine

## 2022-12-19 ENCOUNTER — Other Ambulatory Visit: Payer: Self-pay | Admitting: Psychiatry

## 2022-12-19 DIAGNOSIS — F411 Generalized anxiety disorder: Secondary | ICD-10-CM

## 2022-12-19 DIAGNOSIS — F5101 Primary insomnia: Secondary | ICD-10-CM

## 2022-12-19 DIAGNOSIS — F1190 Opioid use, unspecified, uncomplicated: Secondary | ICD-10-CM

## 2022-12-19 DIAGNOSIS — M961 Postlaminectomy syndrome, not elsewhere classified: Secondary | ICD-10-CM

## 2022-12-19 MED ORDER — BELSOMRA 20 MG PO TABS
20.0000 mg | ORAL_TABLET | Freq: Every day | ORAL | 0 refills | Status: DC
Start: 2022-12-19 — End: 2023-01-24
  Filled 2022-12-19: qty 30, 30d supply, fill #0

## 2022-12-19 MED FILL — Hydrocodone-Acetaminophen Tab 10-325 MG: ORAL | Qty: 100 | Fill #0 | Status: CN

## 2022-12-19 NOTE — Telephone Encounter (Signed)
One month supply sent to bridge to appointment.

## 2022-12-20 ENCOUNTER — Other Ambulatory Visit: Payer: Self-pay

## 2022-12-20 ENCOUNTER — Encounter: Payer: Self-pay | Admitting: Oncology

## 2022-12-20 MED FILL — Hydrocodone-Acetaminophen Tab 10-325 MG: ORAL | 25 days supply | Qty: 100 | Fill #0 | Status: CN

## 2022-12-20 MED FILL — Hydrocodone-Acetaminophen Tab 10-325 MG: ORAL | 25 days supply | Qty: 100 | Fill #0 | Status: AC

## 2022-12-24 ENCOUNTER — Other Ambulatory Visit: Payer: Self-pay

## 2022-12-25 ENCOUNTER — Other Ambulatory Visit: Payer: Self-pay | Admitting: Psychiatry

## 2022-12-25 DIAGNOSIS — F5101 Primary insomnia: Secondary | ICD-10-CM

## 2022-12-25 DIAGNOSIS — F411 Generalized anxiety disorder: Secondary | ICD-10-CM

## 2022-12-26 ENCOUNTER — Other Ambulatory Visit: Payer: Self-pay | Admitting: Psychiatry

## 2022-12-26 ENCOUNTER — Other Ambulatory Visit: Payer: Self-pay

## 2022-12-26 DIAGNOSIS — F411 Generalized anxiety disorder: Secondary | ICD-10-CM

## 2022-12-26 DIAGNOSIS — F5101 Primary insomnia: Secondary | ICD-10-CM

## 2022-12-27 ENCOUNTER — Other Ambulatory Visit: Payer: Self-pay

## 2022-12-27 MED ORDER — ARMODAFINIL 150 MG PO TABS
150.0000 mg | ORAL_TABLET | Freq: Every day | ORAL | 1 refills | Status: DC
  Filled 2022-12-27 – 2023-01-19 (×2): qty 30, 30d supply, fill #0

## 2022-12-27 MED FILL — Alprazolam Tab 1 MG: ORAL | 30 days supply | Qty: 90 | Fill #0 | Status: AC

## 2022-12-27 NOTE — Telephone Encounter (Signed)
Michaela Jones called at 1:25 to make appt and request refills of her medications.  Appt is 6/28.  She needs refill of her Xanax and Amodafinil.  She said the Belsomra was had to get so she wants to discuss a possible replacement at the next appt. So doesn't want a refill right now.  Send to [facility]

## 2022-12-27 NOTE — Telephone Encounter (Signed)
Pended both. 

## 2022-12-27 NOTE — Telephone Encounter (Signed)
Script sent  

## 2022-12-30 ENCOUNTER — Telehealth: Payer: Self-pay | Admitting: Psychiatry

## 2022-12-30 NOTE — Telephone Encounter (Signed)
[facility] sent [place] Request for ARMODAFINIL 150mg  , See CMM

## 2023-01-06 ENCOUNTER — Ambulatory Visit (INDEPENDENT_AMBULATORY_CARE_PROVIDER_SITE_OTHER): Payer: BC Managed Care – PPO | Admitting: Family Medicine

## 2023-01-06 ENCOUNTER — Encounter: Payer: Self-pay | Admitting: Family Medicine

## 2023-01-06 VITALS — BP 109/76 | HR 76 | Temp 98.8°F | Resp 13 | Ht 66.0 in | Wt 243.4 lb

## 2023-01-06 DIAGNOSIS — M79602 Pain in left arm: Secondary | ICD-10-CM

## 2023-01-06 DIAGNOSIS — M25512 Pain in left shoulder: Secondary | ICD-10-CM | POA: Diagnosis not present

## 2023-01-06 DIAGNOSIS — G8929 Other chronic pain: Secondary | ICD-10-CM | POA: Diagnosis not present
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
## 2023-01-10 ENCOUNTER — Encounter (INDEPENDENT_AMBULATORY_CARE_PROVIDER_SITE_OTHER): Payer: Self-pay

## 2023-01-17 ENCOUNTER — Ambulatory Visit: Payer: BC Managed Care – PPO | Admitting: Family Medicine

## 2023-01-19 ENCOUNTER — Other Ambulatory Visit: Payer: Self-pay

## 2023-01-19 ENCOUNTER — Other Ambulatory Visit: Payer: Self-pay | Admitting: Family Medicine

## 2023-01-19 DIAGNOSIS — F1190 Opioid use, unspecified, uncomplicated: Secondary | ICD-10-CM

## 2023-01-19 DIAGNOSIS — M961 Postlaminectomy syndrome, not elsewhere classified: Secondary | ICD-10-CM

## 2023-01-20 ENCOUNTER — Other Ambulatory Visit: Payer: Self-pay

## 2023-01-20 ENCOUNTER — Telehealth: Payer: Self-pay | Admitting: Psychiatry

## 2023-01-20 ENCOUNTER — Encounter: Payer: Self-pay | Admitting: Oncology

## 2023-01-20 MED ORDER — HYDROCODONE-ACETAMINOPHEN 10-325 MG PO TABS
1.0000 | ORAL_TABLET | Freq: Four times a day (QID) | ORAL | 0 refills | Status: DC | PRN
Start: 2023-01-20 — End: 2023-02-11
  Filled 2023-01-20: qty 100, 25d supply, fill #0

## 2023-01-20 NOTE — Telephone Encounter (Signed)
Prior Approval received effective through 01/16/2024 with [employer] for Armodafinil 150 mg

## 2023-01-20 NOTE — Telephone Encounter (Signed)
[facility] [employer] approved NUVIGIL/ARMODAFINIL 150mg   from pt from 01/17/23-01/17/2024

## 2023-01-23 ENCOUNTER — Ambulatory Visit (INDEPENDENT_AMBULATORY_CARE_PROVIDER_SITE_OTHER): Payer: BC Managed Care – PPO

## 2023-01-23 DIAGNOSIS — M79605 Pain in left leg: Secondary | ICD-10-CM

## 2023-01-24 ENCOUNTER — Encounter: Payer: Self-pay | Admitting: Oncology

## 2023-01-24 ENCOUNTER — Other Ambulatory Visit: Payer: Self-pay

## 2023-01-24 ENCOUNTER — Encounter: Payer: Self-pay | Admitting: Psychiatry

## 2023-01-24 ENCOUNTER — Ambulatory Visit: Payer: BC Managed Care – PPO | Admitting: Psychiatry

## 2023-01-24 VITALS — BP 131/92 | HR 77

## 2023-01-24 DIAGNOSIS — F909 Attention-deficit hyperactivity disorder, unspecified type: Secondary | ICD-10-CM

## 2023-01-24 DIAGNOSIS — F411 Generalized anxiety disorder: Secondary | ICD-10-CM

## 2023-01-24 DIAGNOSIS — F5101 Primary insomnia: Secondary | ICD-10-CM

## 2023-01-24 MED ORDER — TRAZODONE HCL 100 MG PO TABS
50.0000 mg | ORAL_TABLET | Freq: Every evening | ORAL | 2 refills | Status: DC | PRN
Start: 2023-01-24 — End: 2023-02-13
  Filled 2023-01-24: qty 30, 30d supply, fill #0

## 2023-01-24 MED ORDER — ARMODAFINIL 150 MG PO TABS
150.0000 mg | ORAL_TABLET | Freq: Every day | ORAL | 5 refills | Status: DC
Start: 2023-02-19 — End: 2023-06-25
  Filled 2023-01-24 – 2023-03-02 (×2): qty 30, 30d supply, fill #0
  Filled 2023-03-13 – 2023-04-09 (×4): qty 30, 30d supply, fill #1

## 2023-01-24 MED FILL — Alprazolam Tab 1 MG: ORAL | 30 days supply | Qty: 90 | Fill #1 | Status: AC
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
## 2023-01-27 ENCOUNTER — Other Ambulatory Visit: Payer: Self-pay

## 2023-01-28 ENCOUNTER — Emergency Department
Admission: EM | Admit: 2023-01-28 | Discharge: 2023-01-29 | Disposition: A | Discharge status: Home / Self Care | Payer: Commercial Managed Care - PPO | Attending: Emergency Medicine | Admitting: Emergency Medicine

## 2023-01-28 ENCOUNTER — Emergency Department: Payer: Commercial Managed Care - PPO

## 2023-01-28 ENCOUNTER — Other Ambulatory Visit: Payer: Self-pay

## 2023-01-28 ENCOUNTER — Encounter: Payer: Self-pay | Admitting: Oncology

## 2023-01-28 ENCOUNTER — Telehealth: Payer: Self-pay | Admitting: Psychiatry

## 2023-01-28 DIAGNOSIS — R440 Auditory hallucinations: Secondary | ICD-10-CM | POA: Diagnosis not present

## 2023-01-28 DIAGNOSIS — T50905A Adverse effect of unspecified drugs, medicaments and biological substances, initial encounter: Secondary | ICD-10-CM

## 2023-01-28 DIAGNOSIS — T402X5A Adverse effect of other opioids, initial encounter: Secondary | ICD-10-CM | POA: Insufficient documentation

## 2023-01-28 DIAGNOSIS — R001 Bradycardia, unspecified: Secondary | ICD-10-CM | POA: Diagnosis not present

## 2023-01-28 DIAGNOSIS — F419 Anxiety disorder, unspecified: Secondary | ICD-10-CM | POA: Diagnosis not present

## 2023-01-28 DIAGNOSIS — Y900 Blood alcohol level of less than 20 mg/100 ml: Secondary | ICD-10-CM | POA: Insufficient documentation

## 2023-01-28 DIAGNOSIS — T40715A Adverse effect of cannabis, initial encounter: Secondary | ICD-10-CM | POA: Diagnosis not present

## 2023-01-28 DIAGNOSIS — T424X5A Adverse effect of benzodiazepines, initial encounter: Secondary | ICD-10-CM | POA: Diagnosis not present

## 2023-01-28 DIAGNOSIS — M542 Cervicalgia: Secondary | ICD-10-CM | POA: Diagnosis not present

## 2023-01-28 DIAGNOSIS — Z79899 Other long term (current) drug therapy: Secondary | ICD-10-CM | POA: Insufficient documentation

## 2023-01-28 DIAGNOSIS — M25512 Pain in left shoulder: Secondary | ICD-10-CM | POA: Diagnosis not present

## 2023-01-28 DIAGNOSIS — G4700 Insomnia, unspecified: Secondary | ICD-10-CM

## 2023-01-28 DIAGNOSIS — Z7901 Long term (current) use of anticoagulants: Secondary | ICD-10-CM | POA: Insufficient documentation

## 2023-01-28 DIAGNOSIS — T426X5A Adverse effect of other antiepileptic and sedative-hypnotic drugs, initial encounter: Secondary | ICD-10-CM | POA: Diagnosis not present

## 2023-01-28 DIAGNOSIS — E039 Hypothyroidism, unspecified: Secondary | ICD-10-CM | POA: Insufficient documentation

## 2023-01-28 DIAGNOSIS — T43215A Adverse effect of selective serotonin and norepinephrine reuptake inhibitors, initial encounter: Secondary | ICD-10-CM | POA: Diagnosis not present

## 2023-01-28 DIAGNOSIS — F22 Delusional disorders: Secondary | ICD-10-CM | POA: Diagnosis not present

## 2023-01-28 LAB — CBC WITH DIFFERENTIAL/PLATELET
Abs Immature Granulocytes: 0.03 10*3/uL (ref 0.00–0.07)
Basophils Absolute: 0 10*3/uL (ref 0.0–0.1)
Basophils Relative: 0 %
Eosinophils Absolute: 0.1 10*3/uL (ref 0.0–0.5)
Eosinophils Relative: 1 %
HCT: 41 % (ref 36.0–46.0)
Hemoglobin: 13.3 g/dL (ref 12.0–15.0)
Immature Granulocytes: 0 %
Lymphocytes Relative: 26 %
Lymphs Abs: 2.7 10*3/uL (ref 0.7–4.0)
MCH: 33.2 pg (ref 26.0–34.0)
MCHC: 32.4 g/dL (ref 30.0–36.0)
MCV: 102.2 fL — ABNORMAL HIGH (ref 80.0–100.0)
Monocytes Absolute: 1 10*3/uL (ref 0.1–1.0)
Monocytes Relative: 10 %
Neutro Abs: 6.7 10*3/uL (ref 1.7–7.7)
Neutrophils Relative %: 63 %
Platelets: 288 10*3/uL (ref 150–400)
RBC: 4.01 MIL/uL (ref 3.87–5.11)
RDW: 13.2 % (ref 11.5–15.5)
WBC: 10.6 10*3/uL — ABNORMAL HIGH (ref 4.0–10.5)
nRBC: 0 % (ref 0.0–0.2)

## 2023-01-28 LAB — COMPREHENSIVE METABOLIC PANEL
ALT: 16 U/L (ref 0–44)
AST: 20 U/L (ref 15–41)
Albumin: 3.9 g/dL (ref 3.5–5.0)
Alkaline Phosphatase: 52 U/L (ref 38–126)
Anion gap: 9 (ref 5–15)
BUN: 7 mg/dL (ref 6–20)
CO2: 24 mmol/L (ref 22–32)
Calcium: 8.8 mg/dL — ABNORMAL LOW (ref 8.9–10.3)
Chloride: 105 mmol/L (ref 98–111)
Creatinine, Ser: 0.54 mg/dL (ref 0.44–1.00)
GFR, Estimated: >60 mL/min (ref >60)
Glucose, Bld: 102 mg/dL — ABNORMAL HIGH (ref 70–99)
Potassium: 3.6 mmol/L (ref 3.5–5.1)
Sodium: 138 mmol/L (ref 135–145)
Total Bilirubin: 0.8 mg/dL (ref 0.3–1.2)
Total Protein: 6.7 g/dL (ref 6.5–8.1)

## 2023-01-28 LAB — URINE DRUG SCREEN, QUALITATIVE (ARMC ONLY)
Amphetamines, Ur Screen: NOT DETECTED
Barbiturates, Ur Screen: NOT DETECTED
Benzodiazepine, Ur Scrn: POSITIVE — AB
Cannabinoid 50 Ng, Ur ~~LOC~~: POSITIVE — AB
Cocaine Metabolite,Ur ~~LOC~~: NOT DETECTED
MDMA (Ecstasy)Ur Screen: NOT DETECTED
Methadone Scn, Ur: NOT DETECTED
Opiate, Ur Screen: POSITIVE — AB
Phencyclidine (PCP) Ur S: NOT DETECTED
Tricyclic, Ur Screen: NOT DETECTED

## 2023-01-28 LAB — URINALYSIS, ROUTINE W REFLEX MICROSCOPIC
Bilirubin Urine: NEGATIVE
Glucose, UA: NEGATIVE mg/dL
Hgb urine dipstick: NEGATIVE
Ketones, ur: NEGATIVE mg/dL
Leukocytes,Ua: NEGATIVE
Nitrite: NEGATIVE
Protein, ur: NEGATIVE mg/dL
Specific Gravity, Urine: 1.005 (ref 1.005–1.030)
pH: 6 (ref 5.0–8.0)

## 2023-01-28 LAB — SALICYLATE LEVEL: Salicylate Lvl: <7 mg/dL — ABNORMAL LOW (ref 7.0–30.0)

## 2023-01-28 LAB — TSH: TSH: 2.311 u[IU]/mL (ref 0.350–4.500)

## 2023-01-28 LAB — ACETAMINOPHEN LEVEL: Acetaminophen (Tylenol), Serum: <10 ug/mL — ABNORMAL LOW (ref 10–30)

## 2023-01-28 LAB — ETHANOL: Alcohol, Ethyl (B): <10 mg/dL (ref <10)

## 2023-01-28 MED ORDER — OXYCODONE-ACETAMINOPHEN 5-325 MG PO TABS
1.0000 | ORAL_TABLET | Freq: Once | ORAL | Status: AC
  Administered 2023-01-28: 1 via ORAL
  Filled 2023-01-28: qty 1

## 2023-01-28 MED ORDER — LORAZEPAM 1 MG PO TABS
1.0000 mg | ORAL_TABLET | Freq: Once | ORAL | Status: AC
  Administered 2023-01-28: 1 mg via ORAL
  Filled 2023-01-28: qty 1

## 2023-01-28 NOTE — ED Notes (Signed)
Pt's husband went home at this time. Pt denies SI or HI. MD aware and comfortable with patient being in room by self. This RN had a 30 minute conversation with patient. Pt calm and cooperative the entire time. Pt reported to this RN that she has a difficult and stressful home life and has a hard time sleeping. Pt states that she last took trazodone last night before bed (01-27-23)

## 2023-01-28 NOTE — Consult Note (Signed)
Telepsych Consultation   Reason for Consult:  Intermittent Hallucinations, Vomiting and Diarrhea Referring Physician:  EDP Location of Patient: [facility] Location of Provider: Other: [facility]  Patient Identification: Michaela Jones MRN:  [id number] Principal Diagnosis: Adverse Drug reaction Diagnosis: Active problems Insomnia Anxiety Total Time spent with patient: 20 minutes  Subjective:   Michaela Jones is a 47 y.o. female patient admitted with past psychiatric history of depression, anxiety and current insomnia, who presented voluntarily to [facility] via EMS with complaints of auditory hallucinations, vomiting and diarrhea after taking her precipitation Trazodone 100 mg at bedtime over a 3 day period.   HPI:  On evaluation, patient is alert, oriented x 4, and cooperative. Speech is clear, normal rate and coherent. Pt appears in hospital scrubs. Eye contact is good. Mood is anxious, affect is congruent with mood. Thought process is coherent and thought content is WDL. Pt denies SI/HI/AVH or paranoia. There is no objective indication that the patient is responding to internal stimuli. No delusions elicited during this assessment.    Patient reports " I just had a bad reaction to a new medication, Trazodone for my insomnia, prescribed by my outpatient psychiatric provider Michaela Jones at [facility] psychiatry, and I've been seeing her for the past 3-4 years for my depression and anxiety, which I've gotten over".  Patient reports she does not recall what her behaviors were, but she was informed by everybody around her that she was hallucinating and talking gibberish with bizarre behaviors. Patient reports " Friday and Sunday, it was really bad, and I went to bed trying to sleep and I heard someone saying mom, and I thought someone was asking a question because sometimes when we are in other parts of the house, we call out to each other".   Patient reports she took the first dose of Trazodone 50 mg on  Friday night around 9-11 pm, and at 1:30 am, she began having nausea and vomiting, throwing up all night, N & V stopped on Saturday around 6 am, then she began having diarrhea, which has continued, making it difficult for her to sleep well in 4 day, as she's only slept for 2 hours total, the last 4 days. Patient reports she increased the dosage to 100 mg PO at bedtime for Sunday and Monday night. Patient reports " I'm still having diarrhea but I have a history of gastric bypass, so I'm not too worried about it and just want to go home".   Patient reports she is also prescribed Levothyroxine, Xanax, Hydrocodone, Nuvigil, Multivitamin tablets and B12 shots monthly by her PCP [name].  She denies illicit substance use, reports enjoying spending time with her family and reading.  She lives with her husband, son, daughter, brother in [place] and [place]. Reports presence of a firearm at home which is secured and accessible to only her husband who has the lock combinations.  Patient denies history of suicide attempts of self harm behaviors. She denies history of inpatient psychiatric hospitalizations.   She identifies her current stressors as worrying about her kids, especially her son who just graduated from [employer], her school work, and reports she is trying to enjoy her summer by relaxing and taking off from work.   Support, encouragement and reassurance provided about ongoing stressors. Patient provided with opportunity for questions.  Patient educated on potential adverse reactions with trazodone, which are consistent with her symptoms such as mania, N/V/D, and confusion.  Discussed recommendation for discharge and follow up with her outpatient  psychiatric provider for medication management after medical clearance. Recommend discontinuing Trazodone.  Discharge recommendations:  Please follow up with your primary care provider for all medical related needs.   Medications: The patient or guardian is to  contact a medical professional and/or outpatient provider to address any new side effects that develop. The patient or guardian should update outpatient providers of any new medications and/or medication changes.   Safety:  The patient should abstain from use of illicit substances/drugs and abuse of any medications. If symptoms worsen or do not continue to improve or if the patient becomes actively suicidal or homicidal then it is recommended that the patient return to the closest hospital emergency department, the [facility], or call 911 for further evaluation and treatment. National Suicide Prevention Lifeline 1-800-SUICIDE or (501) 422-3379.  About 988 988 offers 24/7 access to trained crisis counselors who can help people experiencing mental health-related distress. People can call or text 988 or chat 988lifeline.org for themselves or if they are worried about a loved one who may need crisis support.  Crisis Mobile: [facility]:                     (351) 220-9110 (for crisis response 24 hours a day) [facility] Hotline:                                            312-186-0715    Past Psychiatric History: Depression, anxiety and Insomnia  Risk to Self:  N Risk to Others:  N Prior Inpatient Therapy:  N Prior Outpatient Therapy:  Y  Past Medical History:  Past Medical History:  Diagnosis Date   Anemia    Chronic back pain    Complication of anesthesia    HARD TO WAKE UP/OXYGEN LEVEL DROPPED AFTER BACK SURGERY X1   Constipation, chronic    Depression    DVT (deep venous thrombosis) (HCC)    Edema    Factor 5 Leiden mutation, heterozygous (HCC)    History of seasonal allergies    pt has inhalers for this if needed   Hypotension    Hypothyroid    Insomnia    Lymphedema of both lower extremities    Migraine    Restless leg syndrome    Vitamin D deficiency     Past Surgical History:  Procedure Laterality Date   ABDOMINOPLASTY   2004   ABLATION  2014   BACK SURGERY  10/2012   BREAST ENHANCEMENT SURGERY  07/2008   BREAST SURGERY  09/05/2011   removal of implants   CHOLECYSTECTOMY N/A 04/16/2016   Procedure: LAPAROSCOPIC CHOLECYSTECTOMY WITH INTRAOPERATIVE CHOLANGIOGRAM;  Surgeon: Earline Mayotte, MD;  Location: [facility] ORS;  Service: General;  Laterality: N/A;   GASTRIC BYPASS  2002   Lower body  01/2008, 03/2008   lift legs and thighs   Family History:  Family History  Problem Relation Age of Onset   Pulmonary embolism Mother    Depression Mother    Asthma Mother    Heart attack Father    Cancer Father    Family Psychiatric  History: N/A Social History:  Social History   Substance and Sexual Activity  Alcohol Use Yes   Alcohol/week: 0.0 standard drinks of alcohol   Comment: Rare     Social History   Substance and Sexual Activity  Drug Use No  Social History   Socioeconomic History   Marital status: Married    Spouse name: Not on file   Number of children: Not on file   Years of education: Not on file   Highest education level: Not on file  Occupational History   Not on file  Tobacco Use   Smoking status: Never   Smokeless tobacco: Never  Vaping Use   Vaping Use: Never used  Substance and Sexual Activity   Alcohol use: Yes    Alcohol/week: 0.0 standard drinks of alcohol    Comment: Rare   Drug use: No   Sexual activity: Not on file  Other Topics Concern   Not on file  Social History Narrative   Not on file   Social Determinants of Health   Financial Resource Strain: Not on file  Food Insecurity: Not on file  Transportation Needs: Not on file  Physical Activity: Not on file  Stress: Not on file  Social Connections: Not on file   Additional Social History:    Allergies:   Allergies  Allergen Reactions   Lyrica [Pregabalin] Other (See Comments)    Patient reports neurological problem. Speech disturbances and unable to control motor skills   Penicillins Rash    Per  Husband, Patient can tolerate Cephalosporins    Trazodone And Nefazodone Other (See Comments)    Possible hallucinations, Mania    Gabapentin Swelling   Nsaids Other (See Comments)    Avoids due to gastric bypass Avoids due to gastric bypass   Duloxetine Diarrhea, Nausea And Vomiting, Other (See Comments) and Photosensitivity   Azithromycin Other (See Comments)    Very low absorption due to bariatric surgery; often resulting in therapeutic failure.  Use alternative abx, such as doxycycline or clarithromycin.    Labs:  Results for orders placed or performed during the hospital encounter of 01/28/23 (from the past 48 hour(s))  Comprehensive metabolic panel     Status: Abnormal   Collection Time: 01/28/23  2:50 PM  Result Value Ref Range   Sodium 138 135 - 145 mmol/L   Potassium 3.6 3.5 - 5.1 mmol/L   Chloride 105 98 - 111 mmol/L   CO2 24 22 - 32 mmol/L   Glucose, Bld 102 (H) 70 - 99 mg/dL    Comment: Glucose reference range applies only to samples taken after fasting for at least 8 hours.   BUN 7 6 - 20 mg/dL   Creatinine, Ser 1.61 0.44 - 1.00 mg/dL   Calcium 8.8 (L) 8.9 - 10.3 mg/dL   Total Protein 6.7 6.5 - 8.1 g/dL   Albumin 3.9 3.5 - 5.0 g/dL   AST 20 15 - 41 U/L   ALT 16 0 - 44 U/L   Alkaline Phosphatase 52 38 - 126 U/L   Total Bilirubin 0.8 0.3 - 1.2 mg/dL   GFR, Estimated >09 >60 mL/min    Comment: (NOTE) Calculated using the CKD-EPI Creatinine Equation (2021)    Anion gap 9 5 - 15    Comment: Performed at [facility], [address] [place], [place] [address]  Ethanol     Status: None   Collection Time: 01/28/23  2:50 PM  Result Value Ref Range   Alcohol, Ethyl (B) <10 <10 mg/dL    Comment: (NOTE) Lowest detectable limit for serum alcohol is 10 mg/dL.  For medical purposes only. Performed at [facility], [address] [place], [place] [address]   Acetaminophen level     Status: Abnormal  Collection Time: 01/28/23  2:50 PM  Result  Value Ref Range   Acetaminophen (Tylenol), Serum <10 (L) 10 - 30 ug/mL    Comment: (NOTE) Therapeutic concentrations vary significantly. A range of 10-30 ug/mL  may be an effective concentration for many patients. However, some  are best treated at concentrations outside of this range. Acetaminophen concentrations >150 ug/mL at 4 hours after ingestion  and >50 ug/mL at 12 hours after ingestion are often associated with  toxic reactions.  Performed at [facility], [address] [place], [place] [address]   Salicylate level     Status: Abnormal   Collection Time: 01/28/23  2:50 PM  Result Value Ref Range   Salicylate Lvl <7.0 (L) 7.0 - 30.0 mg/dL    Comment: Performed at [facility], [address] [place], [place] [address]  CBC with Differential     Status: Abnormal   Collection Time: 01/28/23  2:50 PM  Result Value Ref Range   WBC 10.6 (H) 4.0 - 10.5 K/uL   RBC 4.01 3.87 - 5.11 MIL/uL   Hemoglobin 13.3 12.0 - 15.0 g/dL   HCT 09.8 11.9 - 14.7 %   MCV 102.2 (H) 80.0 - 100.0 fL   MCH 33.2 26.0 - 34.0 pg   MCHC 32.4 30.0 - 36.0 g/dL   RDW 82.9 56.2 - 13.0 %   Platelets 288 150 - 400 K/uL   nRBC 0.0 0.0 - 0.2 %   Neutrophils Relative % 63 %   Neutro Abs 6.7 1.7 - 7.7 K/uL   Lymphocytes Relative 26 %   Lymphs Abs 2.7 0.7 - 4.0 K/uL   Monocytes Relative 10 %   Monocytes Absolute 1.0 0.1 - 1.0 K/uL   Eosinophils Relative 1 %   Eosinophils Absolute 0.1 0.0 - 0.5 K/uL   Basophils Relative 0 %   Basophils Absolute 0.0 0.0 - 0.1 K/uL   Immature Granulocytes 0 %   Abs Immature Granulocytes 0.03 0.00 - 0.07 K/uL    Comment: Performed at [facility], [address] [place], [place] [address]  TSH     Status: None   Collection Time: 01/28/23  2:50 PM  Result Value Ref Range   TSH 2.311 0.350 - 4.500 uIU/mL    Comment: Performed by a 3rd Generation assay with a functional sensitivity of <=0.01 uIU/mL. Performed at [facility], [address] [place], [place] [address]   Urine Drug Screen, Qualitative     Status: Abnormal   Collection Time: 01/28/23  4:05 PM  Result Value Ref Range   Tricyclic, Ur Screen NONE DETECTED NONE DETECTED   Amphetamines, Ur Screen NONE DETECTED NONE DETECTED   MDMA (Ecstasy)Ur Screen NONE DETECTED NONE DETECTED   Cocaine Metabolite,Ur [place] NONE DETECTED NONE DETECTED   Opiate, Ur Screen POSITIVE (A) NONE DETECTED   Phencyclidine (PCP) Ur S NONE DETECTED NONE DETECTED   Cannabinoid 50 Ng, Ur [place] POSITIVE (A) NONE DETECTED   Barbiturates, Ur Screen NONE DETECTED NONE DETECTED   Benzodiazepine, Ur Scrn POSITIVE (A) NONE DETECTED   Methadone Scn, Ur NONE DETECTED NONE DETECTED    Comment: (NOTE) Tricyclics + metabolites, urine    Cutoff 1000 ng/mL Amphetamines + metabolites, urine  Cutoff 1000 ng/mL MDMA (Ecstasy), urine              Cutoff 500 ng/mL Cocaine Metabolite, urine          Cutoff 300 ng/mL Opiate + metabolites, urine  Cutoff 300 ng/mL Phencyclidine (PCP), urine         Cutoff 25 ng/mL Cannabinoid, urine                 Cutoff 50 ng/mL Barbiturates + metabolites, urine  Cutoff 200 ng/mL Benzodiazepine, urine              Cutoff 200 ng/mL Methadone, urine                   Cutoff 300 ng/mL  The urine drug screen provides only a preliminary, unconfirmed analytical test result and should not be used for non-medical purposes. Clinical consideration and professional judgment should be applied to any positive drug screen result due to possible interfering substances. A more specific alternate chemical method must be used in order to obtain a confirmed analytical result. Gas chromatography / mass spectrometry (GC/MS) is the preferred confirm atory method. Performed at [facility], [address] [place], [place] [address]   Urinalysis, Routine w reflex microscopic -Urine, Clean Catch     Status: Abnormal   Collection Time: 01/28/23  4:05 PM  Result Value Ref  Range   Color, Urine YELLOW (A) YELLOW   APPearance HAZY (A) CLEAR   Specific Gravity, Urine 1.005 1.005 - 1.030   pH 6.0 5.0 - 8.0   Glucose, UA NEGATIVE NEGATIVE mg/dL   Hgb urine dipstick NEGATIVE NEGATIVE   Bilirubin Urine NEGATIVE NEGATIVE   Ketones, ur NEGATIVE NEGATIVE mg/dL   Protein, ur NEGATIVE NEGATIVE mg/dL   Nitrite NEGATIVE NEGATIVE   Leukocytes,Ua NEGATIVE NEGATIVE    Comment: Performed at [facility], [address] [place], [place] [address]    Medications:  No current facility-administered medications for this encounter.   Current Outpatient Medications  Medication Sig Dispense Refill   albuterol (VENTOLIN HFA) 108 (90 Base) MCG/ACT inhaler Inhale 2 puffs into the lungs every 6 (six) hours as needed for wheezing or shortness of breath. 6.7 g 1   ALPRAZolam (XANAX) 1 MG tablet Take 0.5-1 tablets (0.5-1 mg total) by mouth 3 (three) times daily as needed for anxiety. 90 tablet 5   apixaban (ELIQUIS) 5 MG TABS tablet Take 1 tablet (5 mg total) by mouth 2 (two) times daily. 60 tablet 11   [START ON 02/19/2023] Armodafinil 150 MG tablet Take 1 tablet (150 mg total) by mouth daily. 30 tablet 5   Azelastine-Fluticasone 137-50 MCG/ACT SUSP Place 1 spray into the nose every 12 (twelve) hours. 23 g 0   cetirizine (ZYRTEC) 10 MG tablet Take 10 mg by mouth daily as needed for allergies.     cholecalciferol (VITAMIN D3) 25 MCG (1000 UNIT) tablet Take 1,000 Units by mouth daily.     cyanocobalamin (VITAMIN B12) 1000 MCG/ML injection INJECT 1 MILLILITER (ML) EVERY TWO WEEKS FOR THREE MONTHS THEN ONCE A  MONTH 6 mL 4   HYDROcodone-acetaminophen (NORCO) 10-325 MG tablet Take 1 tablet by mouth 4 (four) times daily as needed. 100 tablet 0   levothyroxine (SYNTHROID) 150 MCG tablet Take 1 tablet (150 mcg total) by mouth daily. Please schedule an office visit before anymore refills. 90 tablet 1   montelukast (SINGULAIR) 10 MG tablet Take 1 tablet (10 mg total) by mouth at  bedtime. 30 tablet 3   Multiple Vitamin (MULTIVITAMIN) tablet Take 1 tablet by mouth daily.     ondansetron (ZOFRAN) 4 MG tablet Take 1 tablet (4 mg total) by mouth every 8 (eight) hours as needed for nausea or vomiting.  20 tablet 0   promethazine (PHENERGAN) 25 MG tablet Take 1 tablet (25 mg total) by mouth every 8 (eight) hours as needed for nausea or vomiting. 30 tablet 2   Rimegepant Sulfate (NURTEC) 75 MG TBDP Take 1 tablet (75 mg total) by mouth every other day. 20 tablet 3   guaiFENesin-dextromethorphan (ROBITUSSIN DM) 100-10 MG/5ML syrup Take 5 mLs by mouth every 4 (four) hours as needed for cough. (Patient taking differently: Take 5 mLs by mouth as needed for cough.) 118 mL 0   traZODone (DESYREL) 100 MG tablet Take 0.5-1 tablets (50-100 mg total) by mouth at bedtime as needed for insomnia (Patient not taking: Reported on 01/28/2023) 30 tablet 2    Musculoskeletal: Strength & Muscle Tone: within normal limits Gait & Station: normal Patient leans: N/A   Psychiatric Specialty Exam:  Presentation  General Appearance:  Other (comment) (In  hospital scrubs)  Eye Contact: Good  Speech: Clear and Coherent  Speech Volume: Normal  Handedness: Right   Mood and Affect  Mood: Anxious  Affect: Congruent   Thought Process  Thought Processes: Coherent  Descriptions of Associations:Intact  Orientation:Full (Time, Place and Person)  Thought Content:WDL  History of Schizophrenia/Schizoaffective disorder:No  Duration of Psychotic Symptoms:Less than six months  Hallucinations:Hallucinations: None  Ideas of Reference:None  Suicidal Thoughts:Suicidal Thoughts: No  Homicidal Thoughts:Homicidal Thoughts: No   Sensorium  Memory: Immediate Good  Judgment: Intact  Insight: Fair; Present   Executive Functions  Concentration: Good  Attention Span: Good  Recall: Good  Fund of Knowledge: Good  Language: Good   Psychomotor Activity  Psychomotor  Activity: Psychomotor Activity: Normal   Assets  Assets:No data recorded  Sleep  Sleep: Sleep: Poor    Physical Exam: Physical Exam Constitutional:      General: She is not in acute distress.    Appearance: She is not diaphoretic.  HENT:     Head: Normocephalic.     Right Ear: External ear normal.     Left Ear: External ear normal.     Nose: No congestion.  Eyes:     General:        Right eye: No discharge.        Left eye: No discharge.  Pulmonary:     Effort: No respiratory distress.  Chest:     Chest wall: No tenderness.  Neurological:     Mental Status: She is alert and oriented to person, place, and time.  Psychiatric:        Attention and Perception: Attention and perception normal.        Mood and Affect: Mood is anxious. Affect is not blunt.        Speech: Speech normal.        Behavior: Behavior is cooperative.        Thought Content: Thought content normal. Thought content is not paranoid or delusional. Thought content does not include homicidal or suicidal ideation. Thought content does not include homicidal or suicidal plan.        Cognition and Memory: Cognition and memory normal.    Review of Systems  Constitutional:  Negative for chills, diaphoresis and fever.  HENT:  Negative for congestion.   Eyes:  Negative for discharge.  Respiratory:  Negative for cough, shortness of breath and wheezing.   Cardiovascular:  Negative for chest pain and palpitations.  Gastrointestinal:  Positive for diarrhea. Negative for nausea and vomiting.  Psychiatric/Behavioral:  Negative for depression, hallucinations, substance abuse and suicidal ideas. The patient is  nervous/anxious and has insomnia.    Blood pressure (!) 146/77, pulse (!) 59, temperature 98.9 F (37.2 C), temperature source Oral, resp. rate 18, height 5\' 6"  (1.676 m), weight 110.4 kg, SpO2 100 %. Body mass index is 39.28 kg/m.  Treatment Plan Summary: Plan Psych cleared, F/U with outpatient psychiatric  provider after medical clearance  Disposition: No evidence of imminent risk to self or others at present.   Patient does not meet criteria for psychiatric inpatient admission. Supportive therapy provided about ongoing stressors. Discussed crisis plan, support from social network, calling 911, coming to the Emergency Department, and calling Suicide Hotline.  This service was provided via telemedicine using a 2-way, interactive audio and video technology.  Names of all persons participating in this telemedicine service and their role in this encounter. Name: Williemae Natter Role: Patient  Name: Mancel Bale Role: NP  Name: Andee Poles Role: LCAS  Name: Benjamine Mola Role: RN    Mancel Bale, NP 01/28/2023 11:25 PM

## 2023-01-28 NOTE — ED Notes (Addendum)
Pt was dressed out husband was present in the room. Husband will be taking pt's belonging home.  White tee-shirt Grey sweat pants black writing on butt Blue panties Black flip flops

## 2023-01-28 NOTE — Telephone Encounter (Signed)
Please see message from patient's husband. He is a Teacher, early years/pre and said he researched and this is not a common SE, but does report being noted. Patient was seen on 6/28. Husband said she did not take trazodone last night, so she should have only had 3 tablets at most. Rx was for 100 mg, 1/2 to 1 tablet. Husband thinks she was taking 1 whole tablet. Patient is still not sleeping on the trazodone. I see that she is also on alprazolam and he didn't think she took extra of that because he said that will knock her out and she isn't sleeping.

## 2023-01-28 NOTE — ED Notes (Signed)
Pt requesting for husband to come back. Registration informed this RN that they took the lock off her chart. MD asked this RN to call husband and have him come back to room. Attempted to call husband with number that was listed in the chart, and it was husbands work number. Will try again after asking patient for husband cell phone number.

## 2023-01-28 NOTE — ED Notes (Signed)
EKG completed and given to provider. 2nd attempt for blood draw without success. Float RN at bedside at this time to attempt blood collection.

## 2023-01-28 NOTE — Telephone Encounter (Signed)
Will add Trazodone to list of allergies/contraindications and advise not taking it again. Have the hallucinations resolved? Recommend not starting a new medication until hallucinations resolve. It looks like she is currently in ED at [facility]. Please ask husband to call back if they have any questions or would like patient to be placed on cancellation list.

## 2023-01-28 NOTE — ED Notes (Signed)
Telepsych in progress. 

## 2023-01-28 NOTE — ED Notes (Signed)
First Nurse Note: Pt here via [facility], pt RX'ed trazodone for insomnia and had vomiting and had auditory hallucinations intermittently.   121/69 HR: 87 100%

## 2023-01-28 NOTE — ED Notes (Signed)
This RN was walking by patients door and asked pt if everything was ok. Pt responded "no, its not. I know he's here. I know my husband is here. I can here his voice. I know my kids are here too. I know their voices." This RN was able to redirect patient and remind her that her husband had went home. Pt cooperative and reported understanding. MD made aware. Charge RN made aware.

## 2023-01-28 NOTE — ED Notes (Signed)
Pt requesting night time medications and imodium. MD notified
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
## 2023-01-28 NOTE — ED Notes (Signed)
Pt and husband both adamantly refusing [facility]. States that is where her mother was frequently in and out of and that it would not be a good idea for placement there. MD and social worker made aware

## 2023-01-28 NOTE — ED Notes (Signed)
Information given to Psychologist, educational at [facility].  Pt to be transferred to [facility].

## 2023-01-28 NOTE — ED Notes (Signed)
Attempted to stick patient for blood draw without success. Will try again after TTS consult is complete. TTS and husband at bedside with patient.

## 2023-01-28 NOTE — BH Assessment (Signed)
PATIENT BED AVAILABLE AFTER 8:30AM ON 01/29/23  Patient has been accepted to [facility].  Accepting physician is Dr. Loni Beckwith.  Call report to [phone].  Representative was Brie.   ER Staff is aware of it:  [facility] ER Secretary  Dr. Scotty Court, ER MD  Aundra Millet Patient's Nurse

## 2023-01-28 NOTE — ED Triage Notes (Signed)
Pt here via [facility] from home with a medication issue. Pt states she took only 4 total Trazodone and is now hearing things. Pt also thinks that she has a needle stuck in her arm. Pt states she has not slept in days as well. Pt is also concerned about her husband having access to her chart.

## 2023-01-28 NOTE — Consult Note (Incomplete)
Telepsych Consultation   Reason for Consult:  Intermittent Hallucinations, Vomiting and Diarrhea Referring Physician:  EDP Location of Patient: [facility] Location of Provider: Other: [facility]  Patient Identification: Michaela Jones MRN:  [id number] Principal Diagnosis: Adverse Drug reaction Diagnosis: Active problems Insomnia Anxiety Total Time spent with patient: 20 minutes  Subjective:   Michaela Jones is a 47 y.o. female patient admitted with past psychiatric history of depression, anxiety and current insomnia, who presented voluntarily to [facility] via EMS with complaints of auditory hallucinations, vomiting and diarrhea after taking her precipitation Trazodone 100 mg at bedtime over a 3 day period.   HPI:  On evaluation, patient is alert, oriented x 4, and cooperative. Speech is clear, normal rate and coherent. Pt appears in hospital scrubs. Eye contact is good. Mood is anxious, affect is congruent with mood. Thought process is coherent and thought content is WDL. Pt denies SI/HI/AVH or paranoia. There is no objective indication that the patient is responding to internal stimuli. No delusions elicited during this assessment.    Patient reports " I just had a bad reaction to a new medication, Trazodone for my insomnia, prescribed by my outpatient psychiatric provider Corie Chiquito at [facility], and I've been seeing her for the past 3-4 years for my depression and anxiety, which I've gotten over".  Patient reports she does not recall what her behaviors were, but she was informed by everybody around her that she was hallucinating and talking gibberish with bizarre behaviors. Patient reports " Friday and Sunday, it was really bad, and I went to bed trying to sleep and I heard someone saying mom, and I thought someone was asking a question because sometimes when we are in other parts of the house, we call out to each other".   Patient reports she took the first dose of Trazodone 50 mg on  Friday night around 9-11 pm, and at 1:30 am, she began having nausea and vomiting, throwing up all night, N & V stopped on Saturday around 6 am, then she began having diarrhea, which has continued, making it difficult for her to sleep well in 4 day, as she's only slept for 2 hours total, the last 4 days. Patient reports she increased the dosage to 100 mg PO at bedtime for Sunday and Monday night. Patient reports " I'm still having diarrhea but I have a history of gastric bypass, so I'm not too worried about it and just want to go home".   Patient reports she is also prescribed Levothyroxine, Xanax, Hydrocodone, Nuvigil, Multivitamin tablets and B12 shots monthly by her PCP [name].  She denies illicit substance use, reports enjoying spending time with her family and reading.  She lives with her husband, son, daughter, brother in [place] and [place]. Reports presence of a firearm at home which is secured and accessible to only her husband who has the lock combinations.  Patient denies history of suicide attempts of self harm behaviors. She denies history of inpatient psychiatric hospitalizations.   She identifies her current stressors as worrying about her kids, especially her son who just graduated from [employer], her school work, and reports she is trying to enjoy her summer by relaxing and taking off from work.   Support, encouragement and reassurance provided about ongoing stressors. Patient provided with opportunity for questions.  Discussed recommendation for discharge and follow up with her outpatint psyc   Past Psychiatric History: Depression, anxiety and Insomnia  Risk to Self:  N Risk to Others:  N  Prior Inpatient Therapy:  N Prior Outpatient Therapy:  Y  Past Medical History:  Past Medical History:  Diagnosis Date  . Anemia   . Chronic back pain   . Complication of anesthesia    HARD TO WAKE UP/OXYGEN LEVEL DROPPED AFTER BACK SURGERY X1  . Constipation, chronic   . Depression   .  DVT (deep venous thrombosis) (HCC)   . Edema   . Factor 5 Leiden mutation, heterozygous (HCC)   . History of seasonal allergies    pt has inhalers for this if needed  . Hypotension   . Hypothyroid   . Insomnia   . Lymphedema of both lower extremities   . Migraine   . Restless leg syndrome   . Vitamin D deficiency     Past Surgical History:  Procedure Laterality Date  . ABDOMINOPLASTY  2004  . ABLATION  2014  . BACK SURGERY  10/2012  . BREAST ENHANCEMENT SURGERY  07/2008  . BREAST SURGERY  09/05/2011   removal of implants  . CHOLECYSTECTOMY N/A 04/16/2016   Procedure: LAPAROSCOPIC CHOLECYSTECTOMY WITH INTRAOPERATIVE CHOLANGIOGRAM;  Surgeon: Earline Mayotte, MD;  Location: [facility] OR[facility];  Service: General;  Laterality: N/A;  . GASTRIC BYPASS  2002  . Lower body  01/2008, 03/2008   lift legs and thighs   Family History:  Family History  Problem Relation Age of Onset  . Pulmonary embolism Mother   . Depression Mother   . Asthma Mother   . Heart attack Father   . Cancer Father    Family Psychiatric  History: *** Social History:  Social History   Substance and Sexual Activity  Alcohol Use Yes  . Alcohol/week: 0.0 standard drinks of alcohol   Comment: Rare     Social History   Substance and Sexual Activity  Drug Use No    Social History   Socioeconomic History  . Marital status: Married    Spouse name: Not on file  . Number of children: Not on file  . Years of education: Not on file  . Highest education level: Not on file  Occupational History  . Not on file  Tobacco Use  . Smoking status: Never  . Smokeless tobacco: Never  Vaping Use  . Vaping Use: Never used  Substance and Sexual Activity  . Alcohol use: Yes    Alcohol/week: 0.0 standard drinks of alcohol    Comment: Rare  . Drug use: No  . Sexual activity: Not on file  Other Topics Concern  . Not on file  Social History Narrative  . Not on file   Social Determinants of Health   Financial Resource  Strain: Not on file  Food Insecurity: Not on file  Transportation Needs: Not on file  Physical Activity: Not on file  Stress: Not on file  Social Connections: Not on file   Additional Social History:    Allergies:   Allergies  Allergen Reactions  . Lyrica [Pregabalin] Other (See Comments)    Patient reports neurological problem. Speech disturbances and unable to control motor skills  . Penicillins Rash    Per Husband, Patient can tolerate Cephalosporins   . Trazodone And Nefazodone Other (See Comments)    Possible hallucinations, Mania   . Gabapentin Swelling  . Nsaids Other (See Comments)    Avoids due to gastric bypass Avoids due to gastric bypass  . Duloxetine Diarrhea, Nausea And Vomiting, Other (See Comments) and Photosensitivity  . Azithromycin Other (See Comments)    Very low  absorption due to bariatric surgery; often resulting in therapeutic failure.  Use alternative abx, such as doxycycline or clarithromycin.    Labs:  Results for orders placed or performed during the hospital encounter of 01/28/23 (from the past 48 hour(s))  Comprehensive metabolic panel     Status: Abnormal   Collection Time: 01/28/23  2:50 PM  Result Value Ref Range   Sodium 138 135 - 145 mmol/L   Potassium 3.6 3.5 - 5.1 mmol/L   Chloride 105 98 - 111 mmol/L   CO2 24 22 - 32 mmol/L   Glucose, Bld 102 (H) 70 - 99 mg/dL    Comment: Glucose reference range applies only to samples taken after fasting for at least 8 hours.   BUN 7 6 - 20 mg/dL   Creatinine, Ser 1.61 0.44 - 1.00 mg/dL   Calcium 8.8 (L) 8.9 - 10.3 mg/dL   Total Protein 6.7 6.5 - 8.1 g/dL   Albumin 3.9 3.5 - 5.0 g/dL   AST 20 15 - 41 U/L   ALT 16 0 - 44 U/L   Alkaline Phosphatase 52 38 - 126 U/L   Total Bilirubin 0.8 0.3 - 1.2 mg/dL   GFR, Estimated >09 >60 mL/min    Comment: (NOTE) Calculated using the CKD-EPI Creatinine Equation (2021)    Anion gap 9 5 - 15    Comment: Performed at [facility], [address] [place], [place] [address]  Ethanol     Status: None   Collection Time: 01/28/23  2:50 PM  Result Value Ref Range   Alcohol, Ethyl (B) <10 <10 mg/dL    Comment: (NOTE) Lowest detectable limit for serum alcohol is 10 mg/dL.  For medical purposes only. Performed at [facility], [address] [place], [place] [address]   Acetaminophen level     Status: Abnormal   Collection Time: 01/28/23  2:50 PM  Result Value Ref Range   Acetaminophen (Tylenol), Serum <10 (L) 10 - 30 ug/mL    Comment: (NOTE) Therapeutic concentrations vary significantly. A range of 10-30 ug/mL  may be an effective concentration for many patients. However, some  are best treated at concentrations outside of this range. Acetaminophen concentrations >150 ug/mL at 4 hours after ingestion  and >50 ug/mL at 12 hours after ingestion are often associated with  toxic reactions.  Performed at [facility], [address] [place], [place] [address]   Salicylate level     Status: Abnormal   Collection Time: 01/28/23  2:50 PM  Result Value Ref Range   Salicylate Lvl <7.0 (L) 7.0 - 30.0 mg/dL    Comment: Performed at [facility], [address] [place], [place] [address]  CBC with Differential     Status: Abnormal   Collection Time: 01/28/23  2:50 PM  Result Value Ref Range   WBC 10.6 (H) 4.0 - 10.5 K/uL   RBC 4.01 3.87 - 5.11 MIL/uL   Hemoglobin 13.3 12.0 - 15.0 g/dL   HCT 08.6 57.8 - 46.9 %   MCV 102.2 (H) 80.0 - 100.0 fL   MCH 33.2 26.0 - 34.0 pg   MCHC 32.4 30.0 - 36.0 g/dL   RDW 62.9 52.8 - 41.3 %   Platelets 288 150 - 400 K/uL   nRBC 0.0 0.0 - 0.2 %   Neutrophils Relative % 63 %   Neutro Abs 6.7 1.7 - 7.7 K/uL   Lymphocytes Relative 26 %   Lymphs Abs 2.7 0.7 - 4.0 K/uL  Monocytes Relative 10 %   Monocytes Absolute 1.0 0.1 - 1.0 K/uL   Eosinophils Relative 1 %   Eosinophils Absolute 0.1 0.0 - 0.5 K/uL   Basophils Relative 0 %   Basophils Absolute 0.0 0.0 - 0.1 K/uL    Immature Granulocytes 0 %   Abs Immature Granulocytes 0.03 0.00 - 0.07 K/uL    Comment: Performed at [facility], [address] [place], [place] [address]  TSH     Status: None   Collection Time: 01/28/23  2:50 PM  Result Value Ref Range   TSH 2.311 0.350 - 4.500 uIU/mL    Comment: Performed by a 3rd Generation assay with a functional sensitivity of <=0.01 uIU/mL. Performed at [facility], [address] [place], [place] [address]   Urine Drug Screen, Qualitative     Status: Abnormal   Collection Time: 01/28/23  4:05 PM  Result Value Ref Range   Tricyclic, Ur Screen NONE DETECTED NONE DETECTED   Amphetamines, Ur Screen NONE DETECTED NONE DETECTED   MDMA (Ecstasy)Ur Screen NONE DETECTED NONE DETECTED   Cocaine Metabolite,Ur [place] NONE DETECTED NONE DETECTED   Opiate, Ur Screen POSITIVE (A) NONE DETECTED   Phencyclidine (PCP) Ur S NONE DETECTED NONE DETECTED   Cannabinoid 50 Ng, Ur [place] POSITIVE (A) NONE DETECTED   Barbiturates, Ur Screen NONE DETECTED NONE DETECTED   Benzodiazepine, Ur Scrn POSITIVE (A) NONE DETECTED   Methadone Scn, Ur NONE DETECTED NONE DETECTED    Comment: (NOTE) Tricyclics + metabolites, urine    Cutoff 1000 ng/mL Amphetamines + metabolites, urine  Cutoff 1000 ng/mL MDMA (Ecstasy), urine              Cutoff 500 ng/mL Cocaine Metabolite, urine          Cutoff 300 ng/mL Opiate + metabolites, urine        Cutoff 300 ng/mL Phencyclidine (PCP), urine         Cutoff 25 ng/mL Cannabinoid, urine                 Cutoff 50 ng/mL Barbiturates + metabolites, urine  Cutoff 200 ng/mL Benzodiazepine, urine              Cutoff 200 ng/mL Methadone, urine                   Cutoff 300 ng/mL  The urine drug screen provides only a preliminary, unconfirmed analytical test result and should not be used for non-medical purposes. Clinical consideration and professional judgment should be applied to any positive drug screen result due to possible interfering  substances. A more specific alternate chemical method must be used in order to obtain a confirmed analytical result. Gas chromatography / mass spectrometry (GC/MS) is the preferred confirm atory method. Performed at [facility], [address] [place], [place] [address]   Urinalysis, Routine w reflex microscopic -Urine, Clean Catch     Status: Abnormal   Collection Time: 01/28/23  4:05 PM  Result Value Ref Range   Color, Urine YELLOW (A) YELLOW   APPearance HAZY (A) CLEAR   Specific Gravity, Urine 1.005 1.005 - 1.030   pH 6.0 5.0 - 8.0   Glucose, UA NEGATIVE NEGATIVE mg/dL   Hgb urine dipstick NEGATIVE NEGATIVE   Bilirubin Urine NEGATIVE NEGATIVE   Ketones, ur NEGATIVE NEGATIVE mg/dL   Protein, ur NEGATIVE NEGATIVE mg/dL   Nitrite NEGATIVE NEGATIVE   Leukocytes,Ua NEGATIVE NEGATIVE    Comment: Performed at [facility],  [address] [place], [place] [address]    Medications:  No current facility-administered medications for this encounter.   Current Outpatient Medications  Medication Sig Dispense Refill  . albuterol (VENTOLIN HFA) 108 (90 Base) MCG/ACT inhaler Inhale 2 puffs into the lungs every 6 (six) hours as needed for wheezing or shortness of breath. 6.7 g 1  . ALPRAZolam (XANAX) 1 MG tablet Take 0.5-1 tablets (0.5-1 mg total) by mouth 3 (three) times daily as needed for anxiety. 90 tablet 5  . apixaban (ELIQUIS) 5 MG TABS tablet Take 1 tablet (5 mg total) by mouth 2 (two) times daily. 60 tablet 11  . [START ON 02/19/2023] Armodafinil 150 MG tablet Take 1 tablet (150 mg total) by mouth daily. 30 tablet 5  . Azelastine-Fluticasone 137-50 MCG/ACT SUSP Place 1 spray into the nose every 12 (twelve) hours. 23 g 0  . cetirizine (ZYRTEC) 10 MG tablet Take 10 mg by mouth daily as needed for allergies.    . cholecalciferol (VITAMIN D3) 25 MCG (1000 UNIT) tablet Take 1,000 Units by mouth daily.    . cyanocobalamin (VITAMIN B12) 1000 MCG/ML injection INJECT 1  MILLILITER (ML) EVERY TWO WEEKS FOR THREE MONTHS THEN ONCE A  MONTH 6 mL 4  . HYDROcodone-acetaminophen (NORCO) 10-325 MG tablet Take 1 tablet by mouth 4 (four) times daily as needed. 100 tablet 0  . levothyroxine (SYNTHROID) 150 MCG tablet Take 1 tablet (150 mcg total) by mouth daily. Please schedule an office visit before anymore refills. 90 tablet 1  . montelukast (SINGULAIR) 10 MG tablet Take 1 tablet (10 mg total) by mouth at bedtime. 30 tablet 3  . Multiple Vitamin (MULTIVITAMIN) tablet Take 1 tablet by mouth daily.    . ondansetron (ZOFRAN) 4 MG tablet Take 1 tablet (4 mg total) by mouth every 8 (eight) hours as needed for nausea or vomiting. 20 tablet 0  . promethazine (PHENERGAN) 25 MG tablet Take 1 tablet (25 mg total) by mouth every 8 (eight) hours as needed for nausea or vomiting. 30 tablet 2  . Rimegepant Sulfate (NURTEC) 75 MG TBDP Take 1 tablet (75 mg total) by mouth every other day. 20 tablet 3  . guaiFENesin-dextromethorphan (ROBITUSSIN DM) 100-10 MG/5ML syrup Take 5 mLs by mouth every 4 (four) hours as needed for cough. (Patient taking differently: Take 5 mLs by mouth as needed for cough.) 118 mL 0  . traZODone (DESYREL) 100 MG tablet Take 0.5-1 tablets (50-100 mg total) by mouth at bedtime as needed for insomnia (Patient not taking: Reported on 01/28/2023) 30 tablet 2    Musculoskeletal: Strength & Muscle Tone: {desc; muscle tone:32375} Gait & Station: {PE GAIT ED UEAV:40981} Patient leans: N/A   Psychiatric Specialty Exam:  Presentation  General Appearance:  Other (comment) (In  hospital scrubs)  Eye Contact: Good  Speech: Clear and Coherent  Speech Volume: Normal  Handedness: Right   Mood and Affect  Mood: Anxious  Affect: Congruent   Thought Process  Thought Processes: Coherent  Descriptions of Associations:Intact  Orientation:Full (Time, Place and Person)  Thought Content:WDL  History of Schizophrenia/Schizoaffective disorder:No  Duration  of Psychotic Symptoms:Less than six months  Hallucinations:Hallucinations: None  Ideas of Reference:None  Suicidal Thoughts:Suicidal Thoughts: No  Homicidal Thoughts:Homicidal Thoughts: No   Sensorium  Memory: Immediate Good  Judgment: Intact  Insight: Fair; Present   Executive Functions  Concentration: Good  Attention Span: Good  Recall: Good  Fund of Knowledge: Good  Language: Good   Psychomotor Activity  Psychomotor Activity: Psychomotor  Activity: Normal   Assets  Assets:No data recorded  Sleep  Sleep: Sleep: Poor    Physical Exam: Physical Exam Constitutional:      General: She is not in acute distress.    Appearance: She is not diaphoretic.  HENT:     Head: Normocephalic.     Right Ear: External ear normal.     Left Ear: External ear normal.     Nose: No congestion.  Eyes:     General:        Right eye: No discharge.        Left eye: No discharge.  Pulmonary:     Effort: No respiratory distress.  Chest:     Chest wall: No tenderness.  Neurological:     Mental Status: She is alert and oriented to person, place, and time.  Psychiatric:        Attention and Perception: Attention and perception normal.        Mood and Affect: Mood is anxious. Affect is not blunt.        Speech: Speech normal.        Behavior: Behavior is cooperative.        Thought Content: Thought content normal. Thought content is not paranoid or delusional. Thought content does not include homicidal or suicidal ideation. Thought content does not include homicidal or suicidal plan.        Cognition and Memory: Cognition and memory normal.    Review of Systems  Constitutional:  Negative for chills, diaphoresis and fever.  HENT:  Negative for congestion.   Eyes:  Negative for discharge.  Respiratory:  Negative for cough, shortness of breath and wheezing.   Cardiovascular:  Negative for chest pain and palpitations.  Gastrointestinal:  Positive for diarrhea.  Negative for nausea and vomiting.  Psychiatric/Behavioral:  Negative for depression, hallucinations, substance abuse and suicidal ideas. The patient is nervous/anxious and has insomnia.    Blood pressure (!) 146/77, pulse (!) 59, temperature 98.9 F (37.2 C), temperature source Oral, resp. rate 18, height 5\' 6"  (1.676 m), weight 110.4 kg, SpO2 100 %. Body mass index is 39.28 kg/m.  Treatment Plan Summary: Plan Psych cleared, F/U with outpatient psychiatric provider when medically stable  Disposition: No evidence of imminent risk to self or others at present.   Patient does not meet criteria for psychiatric inpatient admission. Supportive therapy provided about ongoing stressors. Discussed crisis plan, support from social network, calling 911, coming to the Emergency Department, and calling Suicide Hotline.  This service was provided via telemedicine using a 2-way, interactive audio and video technology.  Names of all persons participating in this telemedicine service and their role in this encounter. Name: Williemae Natter Role: Patient  Name: Mancel Bale Role: NP  Name: Andee Poles Role: LCAS  Name: Benjamine Mola Role: RN    Mancel Bale, NP 01/28/2023 11:25 PM

## 2023-01-28 NOTE — ED Notes (Signed)
Pt given sandwich tray and diet soda at this time. Husband remains at bedside.

## 2023-01-28 NOTE — Telephone Encounter (Signed)
Patient's husband Harrold Donath) called into office regarding Trazedone that Laurissa was recently prescribed. States that since [place] has been on medication she has been in a manic episode. She is experiencing hallucinations and delusions. 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 "Its like she is daydreaming and acting out the daydreams. This morning she thought they were in classroom and Resha told husband to be quiet because they're may be other parents around who could hear. He became somewhat upset and did curse and she said that teachers could hear him. He is thinking of taking her to hospital but wanted some input from [facility] or nurse. He is listed on DPR. Ph: [phone] Appt 8/8

## 2023-01-28 NOTE — ED Notes (Signed)
Vol pending consult 
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
## 2023-01-28 NOTE — ED Notes (Signed)
ED provider at bedside.

## 2023-01-28 NOTE — BH Assessment (Signed)
Per [facility] (Danika, R.), patient to be referred out of system.  Referral information for Psychiatric Hospitalization faxed to;    [facility] [phone]- [phone]),   [facility] ([phone]--[phone])  [facility] [phone] -[phone]) [phone]  [facility] [phone] & [phone])  [facility] [phone]),  [address] [phone]),   [facility] [phone] -or- [phone]),   [facility] [phone]),  [place] [phone])  [place] [phone] or [phone]),   [facility] [phone]

## 2023-01-29 ENCOUNTER — Telehealth: Payer: Self-pay | Admitting: Family Medicine

## 2023-01-29 ENCOUNTER — Other Ambulatory Visit: Payer: Self-pay

## 2023-01-29 ENCOUNTER — Telehealth: Payer: Self-pay

## 2023-01-29 LAB — POC URINE PREG, ED: Preg Test, Ur: NEGATIVE

## 2023-01-29 MED ORDER — OXYCODONE-ACETAMINOPHEN 5-325 MG PO TABS
1.0000 | ORAL_TABLET | Freq: Once | ORAL | Status: AC
  Administered 2023-01-29: 1 via ORAL
  Filled 2023-01-29: qty 1

## 2023-01-29 MED ORDER — ALPRAZOLAM 0.5 MG PO TABS
1.0000 mg | ORAL_TABLET | Freq: Once | ORAL | Status: AC
  Administered 2023-01-29: 1 mg via ORAL
  Filled 2023-01-29: qty 2

## 2023-01-29 NOTE — Discharge Instructions (Addendum)
Please stop taking Trazodone.  Return to the ER for worsening symptoms, persistent vomiting, difficulty breathing, feelings of hurting yourself or others, or other concerns.

## 2023-01-29 NOTE — Telephone Encounter (Signed)
Copied from CRM [id number]. Topic: Appointment Scheduling - Scheduling Inquiry for Clinic >> Jan 29, 2023  2:37 PM Carrielelia G wrote: Pt. Would like for Dr to read the hospital after visit summary, would to get in sooner to see dr Sherrie Mustache, also.

## 2023-01-29 NOTE — Telephone Encounter (Signed)
Medication Refill - Medication: oxyCODONE-acetaminophen (PERCOCET/ROXICET) 5-325 MG per tablet 2 tablet   Has the patient contacted their [facility] [address], [place] [address] Phone: [phone]  Fax: [phone] DEA #: [id number]    Has the patient been seen for an appointment in the last year OR does the patient have an upcoming appointment? Yes.    Agent: Please be advised that RX refills may take up to 3 business days. We ask that you follow-up with your pharmacy.

## 2023-01-29 NOTE — ED Notes (Signed)
Psych cleared by NP Chinwendu.

## 2023-01-29 NOTE — ED Provider Notes (Signed)
-----------------------------------------   12:00 AM on 01/29/2023 -----------------------------------------   Care assumed of patient who has seen psychiatric NP face-to-face via telepsychiatry modality and deemed psychiatrically cleared for discharge home.  She is trying to get a hold of her husband to update him as she has declined two facilities ([address] and [place]).  If needed, I have offered her a sleep aid and to sleep here overnight and reassess her mental state in the morning.  ----------------------------------------- 12:46 AM on 01/29/2023 -----------------------------------------   Patient spoke with husband who is in agreement that she should go home tonight.  He is on his way to pick her up.  He works as a Photographer and knows to bring her back if her symptoms worsen.  She is not SI/HI.  Patient asking for her nighttime medications before discharge.  Will follow-up with her therapist closely.  Strict return precautions given.  Patient verbalizes understanding agrees with plan of care.   Irean Hong, MD 01/29/23 [phone]

## 2023-01-31 ENCOUNTER — Encounter: Payer: Self-pay | Admitting: Oncology

## 2023-01-31 ENCOUNTER — Other Ambulatory Visit: Payer: Self-pay

## 2023-01-31 MED ORDER — OXYCODONE-ACETAMINOPHEN 5-325 MG PO TABS
1.0000 | ORAL_TABLET | Freq: Four times a day (QID) | ORAL | 0 refills | Status: DC | PRN
  Filled 2023-01-31: qty 7, 2d supply, fill #0

## 2023-01-31 NOTE — Telephone Encounter (Signed)
Left message advising as below. Advised to call back to schedule appt.

## 2023-01-31 NOTE — Telephone Encounter (Signed)
You can use SameDay slot for Hospital Follow up appts.

## 2023-01-31 NOTE — Telephone Encounter (Signed)
Please advise 

## 2023-02-03 ENCOUNTER — Other Ambulatory Visit: Payer: Self-pay

## 2023-02-04 ENCOUNTER — Ambulatory Visit (INDEPENDENT_AMBULATORY_CARE_PROVIDER_SITE_OTHER): Payer: Commercial Managed Care - PPO | Admitting: Family Medicine

## 2023-02-04 ENCOUNTER — Encounter: Payer: Self-pay | Admitting: Oncology

## 2023-02-04 ENCOUNTER — Other Ambulatory Visit: Payer: Self-pay

## 2023-02-04 VITALS — BP 120/80 | HR 87 | Temp 98.6°F | Ht 65.0 in | Wt 236.0 lb

## 2023-02-04 DIAGNOSIS — G43019 Migraine without aura, intractable, without status migrainosus: Secondary | ICD-10-CM

## 2023-02-04 DIAGNOSIS — M25512 Pain in left shoulder: Secondary | ICD-10-CM | POA: Diagnosis not present

## 2023-02-04 DIAGNOSIS — G8929 Other chronic pain: Secondary | ICD-10-CM

## 2023-02-04 MED ORDER — CELECOXIB 100 MG PO CAPS
100.0000 mg | ORAL_CAPSULE | Freq: Two times a day (BID) | ORAL | 0 refills | Status: DC | PRN
  Filled 2023-02-04: qty 30, 15d supply, fill #0

## 2023-02-04 MED ORDER — PREDNISONE 10 MG PO TABS
ORAL_TABLET | ORAL | 0 refills | Status: AC
Start: 2023-02-04 — End: 2023-02-13
  Filled 2023-02-04: qty 20, 8d supply, fill #0
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
## 2023-02-05 ENCOUNTER — Encounter: Payer: Self-pay | Admitting: Oncology

## 2023-02-06 ENCOUNTER — Encounter: Payer: Self-pay | Admitting: Oncology

## 2023-02-06 ENCOUNTER — Other Ambulatory Visit: Payer: Self-pay

## 2023-02-06 ENCOUNTER — Other Ambulatory Visit: Payer: Self-pay | Admitting: Family Medicine

## 2023-02-06 DIAGNOSIS — T50905A Adverse effect of unspecified drugs, medicaments and biological substances, initial encounter: Secondary | ICD-10-CM

## 2023-02-06 DIAGNOSIS — R440 Auditory hallucinations: Secondary | ICD-10-CM

## 2023-02-06 MED ORDER — OXYCODONE-ACETAMINOPHEN 5-325 MG PO TABS
1.0000 | ORAL_TABLET | Freq: Four times a day (QID) | ORAL | 0 refills | Status: DC | PRN
  Filled 2023-02-06: qty 7, 2d supply, fill #0

## 2023-02-07 NOTE — Telephone Encounter (Signed)
Please see message from patient and advise.  ?

## 2023-02-07 NOTE — Telephone Encounter (Signed)
Spoke with patient regarding previous messages. While in hospital they took her off the [place] and was advised she reach out to update [name]. She inquired about if she needed to come in earlier than appt scheduled 7/24. She is fine with waiting until than but if she needs to be send sooner she come in. Ph: [phone]

## 2023-02-11 ENCOUNTER — Other Ambulatory Visit: Payer: Self-pay | Admitting: Family Medicine

## 2023-02-11 DIAGNOSIS — F1190 Opioid use, unspecified, uncomplicated: Secondary | ICD-10-CM

## 2023-02-11 DIAGNOSIS — M961 Postlaminectomy syndrome, not elsewhere classified: Secondary | ICD-10-CM

## 2023-02-11 NOTE — Telephone Encounter (Signed)
Patient ok with 7/24 visit.

## 2023-02-12 ENCOUNTER — Inpatient Hospital Stay: Payer: BC Managed Care – PPO | Admitting: Family Medicine

## 2023-02-12 ENCOUNTER — Other Ambulatory Visit: Payer: Self-pay

## 2023-02-13 ENCOUNTER — Other Ambulatory Visit: Payer: Self-pay

## 2023-02-13 ENCOUNTER — Encounter: Payer: Self-pay | Admitting: Oncology

## 2023-02-13 MED ORDER — HYDROCODONE-ACETAMINOPHEN 10-325 MG PO TABS
1.0000 | ORAL_TABLET | Freq: Four times a day (QID) | ORAL | 0 refills | Status: DC | PRN
Start: 2023-02-13 — End: 2023-03-06
  Filled 2023-02-13: qty 100, 25d supply, fill #0

## 2023-02-13 MED ORDER — OXYCODONE-ACETAMINOPHEN 5-325 MG PO TABS
1.0000 | ORAL_TABLET | Freq: Every day | ORAL | 0 refills | Status: DC | PRN
  Filled 2023-02-13: qty 7, 7d supply, fill #0

## 2023-02-17 ENCOUNTER — Other Ambulatory Visit: Payer: Self-pay

## 2023-02-17 DIAGNOSIS — S43402A Unspecified sprain of left shoulder joint, initial encounter: Secondary | ICD-10-CM | POA: Diagnosis not present

## 2023-02-17 DIAGNOSIS — M5412 Radiculopathy, cervical region: Secondary | ICD-10-CM | POA: Diagnosis not present

## 2023-02-17 MED ORDER — METHOCARBAMOL 500 MG PO TABS
500.0000 mg | ORAL_TABLET | Freq: Two times a day (BID) | ORAL | 3 refills | Status: DC
  Filled 2023-02-17: qty 60, 30d supply, fill #0
  Filled 2023-03-02 – 2023-03-16 (×2): qty 60, 30d supply, fill #1
  Filled 2023-04-09: qty 60, 30d supply, fill #2
  Filled 2023-04-23 – 2023-05-05 (×2): qty 60, 30d supply, fill #3

## 2023-02-19 ENCOUNTER — Ambulatory Visit: Payer: Commercial Managed Care - PPO | Admitting: Psychiatry

## 2023-02-19 ENCOUNTER — Encounter: Payer: Self-pay | Admitting: Psychiatry

## 2023-02-19 ENCOUNTER — Encounter: Payer: Self-pay | Admitting: Oncology

## 2023-02-19 ENCOUNTER — Other Ambulatory Visit: Payer: Self-pay

## 2023-02-19 DIAGNOSIS — F411 Generalized anxiety disorder: Secondary | ICD-10-CM

## 2023-02-19 DIAGNOSIS — F909 Attention-deficit hyperactivity disorder, unspecified type: Secondary | ICD-10-CM | POA: Diagnosis not present

## 2023-02-19 DIAGNOSIS — F5101 Primary insomnia: Secondary | ICD-10-CM | POA: Diagnosis not present

## 2023-02-19 MED ORDER — ALPRAZOLAM 1 MG PO TABS
1.0000 mg | ORAL_TABLET | Freq: Every day | ORAL | 1 refills | Status: DC
Start: 2023-02-19 — End: 2023-03-13
  Filled 2023-02-19: qty 60, fill #0
  Filled 2023-02-20 – 2023-02-24 (×2): qty 60, 30d supply, fill #0

## 2023-02-19 MED ORDER — ALPRAZOLAM ER 1 MG PO TB24
1.0000 mg | ORAL_TABLET | Freq: Every day | ORAL | 1 refills | Status: DC
  Filled 2023-02-19: qty 30, 30d supply, fill #0

## 2023-02-19 NOTE — Progress Notes (Signed)
Michaela Jones [id number] June 10, 1976 47 y.o.  Subjective:   Patient ID:  Michaela Jones is a 47 y.o. (DOB Jun 17, 1976) female.  Chief Complaint:  Chief Complaint  Patient presents with   Sleeping Problem   Medication Reaction    Hallucinations and delusions after brief trial of Trazodone    HPI Michaela Jones presents to the office today after adverse effects with Trazodone. She is accompanied by her husband.   She reports that she took Trazodone for the first time on Friday night, 01/24/23, and she started vomiting. She reports that she initially thought she may have food poisoning. Her husband reports that Saturday she "was talking about things that didn't make sense." She reports that she took another dose Sunday night. Her husband reports that there were #15 Trazodone tablets left in the bottle after 4 days, however it looks like the tablets had been spilled and swept up. Patient does not recall taking more that prescribed and denies any suicidal thoughts. Her husband reports that she did not make any suicidal statements and that he has not observed any signs that she may be suicidal.  She reports that within a few days after starting Trazodone she started to hallucinate and experience paranoia. She reports that she saw people inside her house that were not there, and thought she saw students in her bathroom while she was taking a shower. She also saw people outside in the branches of their trees. At one point she thought her husband was home despite her teenage children re-assuring her he was not there. She was then convinced that he had parked his truck in the neighbor's driveway. They reports that she was talking to people that were not there and sending text messages that were disorganized. She did not sleep for about 4 days while having these symptoms. She had some impulsivity and came downstairs without being fully dressed, which is not typical for her, and became agitated when husband insisted  she go upstairs and put on more clothing. She had racing thoughts, excessive energy, and increased goal-directed activity.   She went to the ED 01/28/23 and was later discharged home in the care of her husband. They report that it took a couple of days for her symptoms to resolve. Husband reports that she had some "brief moments" of psychosis on Wednesday, 01/29/23, and psychosis fully resolved by Thursday, 01/30/23. Husband reports that she slept for about 2 days when symptoms resolved. Husband reports that her mental status has fully returned to baseline.   Patient agrees that her mental status has returned to baseline. She has some increased anxiety with job stress. She has had increased anxiety and at times is sweaty and shaky due to anxiety. She received a job offer yesterday for a position that she is interested in and reports that her anxiety has improved since then and feels "relieved." She reports that her sleep is fair. She usually takes Xanax 2 mg to be able to fall asleep and then sleeps for about 4 hours and is usually unable to return to sleep unless she takes Xanax 1 mg. She describes her mood as "not happy, not sad." Denies racing thoughts and reports that concentration is ok. Denies SI. She and husband report that she did not have any suicidal thoughts during adverse effects of Trazodone.   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 He and patient deny any past manic symptoms. They both also deny any past hallucinations or delusions.   Alprazolam last filled 01/27/23.  Past Psychiatric Medication Trials: Cymbalta Sertraline Remeron Wellbutrin Buspar Nuvigil Provigil- increased activation Ritalin Concerta Adderall- Not as effective Strattera Klonopin Xanax Vistaril- Ineffective Ambien- Effective. Caused parasomnias. Lunesta Temazepam Trazodone-Hallucinations,  delusions Belsomra Dayvigo Doxepin Silenor Seroquel- wt gain Gabapentin- Stuttering Lyrica- "Almost catatonic" Trileptal- tingling in extremities   PHQ2-9    Flowsheet Row Office Visit from 02/04/2023 in [facility] Office Visit from 01/06/2023 in [facility] Office Visit from 11/11/2022 in [facility] Office Visit from 11/04/2022 in [facility] Office Visit from 06/24/2022 in [facility]  PHQ-2 Total Score 2 2 1 3  0  PHQ-9 Total Score 15 11 6 10 4       Flowsheet Row ED from 01/28/2023 in [facility] Emergency Department at [facility]  C-SSRS RISK CATEGORY No Risk        Review of Systems:  Review of Systems  Musculoskeletal:  Negative for gait problem.  Neurological:  Negative for tremors.  Psychiatric/Behavioral:         Please refer to HPI    Medications: I have reviewed the patient's current medications.  Current Outpatient Medications  Medication Sig Dispense Refill   ALPRAZolam (ALPRAZOLAM XR) 1 MG 24 hr tablet Take 1 tablet (1 mg total) by mouth at bedtime. 30 tablet 1   albuterol (VENTOLIN HFA) 108 (90 Base) MCG/ACT inhaler Inhale 2 puffs into the lungs every 6 (six) hours as needed for wheezing or shortness of breath. 6.7 g 1   ALPRAZolam (XANAX) 1 MG tablet Take 1-2 tablets (1-2 mg total) by mouth at bedtime. 60 tablet 1   apixaban (ELIQUIS) 5 MG TABS tablet Take 1 tablet (5 mg total) by mouth 2 (two) times daily. 60 tablet 11   Armodafinil 150 MG tablet Take 1 tablet (150 mg total) by mouth daily. 30 tablet 5   Azelastine-Fluticasone 137-50 MCG/ACT SUSP Place 1 spray into the nose every 12 (twelve) hours. 23 g 0   celecoxib (CELEBREX) 100 MG capsule Take 1 capsule (100 mg total) by mouth 2 (two) times daily as needed. 30 capsule 0   cetirizine (ZYRTEC) 10 MG tablet Take 10 mg by mouth daily as needed for allergies.      cholecalciferol (VITAMIN D3) 25 MCG (1000 UNIT) tablet Take 1,000 Units by mouth daily.     cyanocobalamin (VITAMIN B12) 1000 MCG/ML injection INJECT 1 MILLILITER (ML) EVERY TWO WEEKS FOR THREE MONTHS THEN ONCE A  MONTH 6 mL 4   guaiFENesin-dextromethorphan (ROBITUSSIN DM) 100-10 MG/5ML syrup Take 5 mLs by mouth every 4 (four) hours as needed for cough. (Patient taking differently: Take 5 mLs by mouth as needed for cough.) 118 mL 0   HYDROcodone-acetaminophen (NORCO) 10-325 MG tablet Take 1 tablet by mouth 4 (four) times daily as needed. 100 tablet 0   levothyroxine (SYNTHROID) 150 MCG tablet Take 1 tablet (150 mcg total) by mouth daily. Please schedule an office visit before anymore refills. 90 tablet 1   methocarbamol (ROBAXIN) 500 MG tablet Take 1 tablet (500 mg total) by mouth 2 (two) times daily. 60 tablet 3   montelukast (SINGULAIR) 10 MG tablet Take 1 tablet (10 mg total) by mouth at bedtime. 30 tablet 3   Multiple Vitamin (MULTIVITAMIN) tablet Take 1 tablet by mouth daily.     ondansetron (  ZOFRAN) 4 MG tablet Take 1 tablet (4 mg total) by mouth every 8 (eight) hours as needed for nausea or vomiting. 20 tablet 0   oxyCODONE-acetaminophen (PERCOCET) 5-325 MG tablet Take 1 tablet by mouth daily as needed. 7 tablet 0   promethazine (PHENERGAN) 25 MG tablet Take 1 tablet (25 mg total) by mouth every 8 (eight) hours as needed for nausea or vomiting. 30 tablet 2   Rimegepant Sulfate (NURTEC) 75 MG TBDP Take 1 tablet (75 mg total) by mouth every other day. 20 tablet 3   No current facility-administered medications for this visit.    Medication Side Effects: Other: psychotic symptoms after briefly taking Trazodone  Allergies:  Allergies  Allergen Reactions   Lyrica [Pregabalin] Other (See Comments)    Patient reports neurological problem. Speech disturbances and unable to control motor skills   Penicillins Rash    Per Husband, Patient can tolerate Cephalosporins    Trazodone And Nefazodone  Other (See Comments)    Possible hallucinations, Mania    Gabapentin Swelling   Nsaids Other (See Comments)    Avoids due to gastric bypass Avoids due to gastric bypass   Duloxetine Diarrhea, Nausea And Vomiting, Other (See Comments) and Photosensitivity   Azithromycin Other (See Comments)    Very low absorption due to bariatric surgery; often resulting in therapeutic failure.  Use alternative abx, such as doxycycline or clarithromycin.    Past Medical History:  Diagnosis Date   Anemia    Chronic back pain    Complication of anesthesia    HARD TO WAKE UP/OXYGEN LEVEL DROPPED AFTER BACK SURGERY X1   Constipation, chronic    Depression    DVT (deep venous thrombosis) (HCC)    Edema    Factor 5 Leiden mutation, heterozygous (HCC)    History of seasonal allergies    pt has inhalers for this if needed   Hypotension    Hypothyroid    Insomnia    Lymphedema of both lower extremities    Migraine    Restless leg syndrome    Vitamin D deficiency     Past Medical History, Surgical history, Social history, and Family history were reviewed and updated as appropriate.   Please see review of systems for further details on the patient's review from today.   Objective:   Physical Exam:  There were no vitals taken for this visit.  Physical Exam Constitutional:      General: She is not in acute distress. Musculoskeletal:        General: No deformity.  Neurological:     Mental Status: She is alert and oriented to person, place, and time.     Coordination: Coordination normal.  Psychiatric:        Attention and Perception: Attention and perception normal. She does not perceive auditory or visual hallucinations.        Mood and Affect: Mood is anxious. Mood is not depressed. Affect is not labile, blunt, angry or inappropriate.        Speech: Speech normal.        Behavior: Behavior normal.        Thought Content: Thought content normal. Thought content is not paranoid or delusional.  Thought content does not include homicidal or suicidal ideation. Thought content does not include homicidal or suicidal plan.        Cognition and Memory: Cognition and memory normal.        Judgment: Judgment normal.     Comments: Insight intact  Lab Review:     Component Value Date/Time   NA 138 01/28/2023 1450   NA 144 05/15/2022 1031   NA 139 12/03/2012 1136   K 3.6 01/28/2023 1450   K 4.0 12/03/2012 1136   CL 105 01/28/2023 1450   CL 105 12/03/2012 1136   CO2 24 01/28/2023 1450   CO2 26 12/03/2012 1136   GLUCOSE 102 (H) 01/28/2023 1450   GLUCOSE 77 12/03/2012 1136   BUN 7 01/28/2023 1450   BUN 10 05/15/2022 1031   BUN 16 12/03/2012 1136   CREATININE 0.54 01/28/2023 1450   CREATININE 0.46 (L) 12/03/2012 1136   CALCIUM 8.8 (L) 01/28/2023 1450   CALCIUM 9.0 12/03/2012 1136   PROT 6.7 01/28/2023 1450   PROT 6.2 05/15/2022 1031   ALBUMIN 3.9 01/28/2023 1450   ALBUMIN 3.9 05/15/2022 1031   AST 20 01/28/2023 1450   ALT 16 01/28/2023 1450   ALKPHOS 52 01/28/2023 1450   BILITOT 0.8 01/28/2023 1450   BILITOT 0.2 05/15/2022 1031   GFRNONAA >60 01/28/2023 1450   GFRNONAA >60 12/03/2012 1136   GFRAA 102 04/07/2020 1130   GFRAA >60 12/03/2012 1136       Component Value Date/Time   WBC 10.6 (H) 01/28/2023 1450   RBC 4.01 01/28/2023 1450   HGB 13.3 01/28/2023 1450   HGB 13.1 05/15/2022 1031   HCT 41.0 01/28/2023 1450   HCT 39.2 05/15/2022 1031   PLT 288 01/28/2023 1450   PLT 222 05/15/2022 1031   MCV 102.2 (H) 01/28/2023 1450   MCV 100 (H) 05/15/2022 1031   MCV 101 (H) 06/15/2013 1042   MCH 33.2 01/28/2023 1450   MCHC 32.4 01/28/2023 1450   RDW 13.2 01/28/2023 1450   RDW 12.3 05/15/2022 1031   RDW 14.5 06/15/2013 1042   LYMPHSABS 2.7 01/28/2023 1450   LYMPHSABS 2.5 04/07/2020 1130   LYMPHSABS 3.5 06/15/2013 1042   MONOABS 1.0 01/28/2023 1450   MONOABS 0.9 06/15/2013 1042   EOSABS 0.1 01/28/2023 1450   EOSABS 0.2 04/07/2020 1130   EOSABS 0.1 06/15/2013 1042    BASOSABS 0.0 01/28/2023 1450   BASOSABS 0.1 04/07/2020 1130   BASOSABS 0.1 06/15/2013 1042    No results found for: "POCLITH", "LITHIUM"   No results found for: "PHENYTOIN", "PHENOBARB", "VALPROATE", "CBMZ"   .res Assessment: Plan:    48 minutes spent dedicated to the care of this patient on the date of this encounter to include pre-visit review of records, ordering of medication, post visit documentation, and face-to-face time with the patient discussing adverse reaction to Trazodone and other treatment options for insomnia. Trazodone has been added to list of allergies/contraindications and agree that recent mania with psychosis seems to have been precipitated by Trazodone, and likely exacerbated by sleep deprivation, since there are no other known precipitating factors and patient has never experienced mania or psychosis prior to this episode. Discussed that Xanax immediate release is likely helping with sleep initiation and then is not helping with sleep maintenance since it typically has a duration of 4 hours. Discussed starting combination of Xanax IR and Xanax XR since Xanax XR has a longer duration and may help her sleep longer and continuing a lower amount of Xanax IR may help with sleep initiation. Pt and husband are in agreement with this plan. Discussed that pt has tolerated Xanax overall and benefits of using combination of Xanax XR and Xanax IR seem to outweigh potential risks at this time, considering history of adverse effect with Trazodone and  other medications.  Will continue Armodafinil 150 mg daily for off-label indication of ADHD.  Pt to follow-up with this provider in 3-4 weeks or sooner if clinically indicated.  Patient advised to contact office with any questions, adverse effects, or acute worsening in signs and symptoms.    Michaela "[name]" was seen today for sleeping problem and medication reaction.  Diagnoses and all orders for this visit:  Generalized anxiety  disorder -     ALPRAZolam (XANAX) 1 MG tablet; Take 1-2 tablets (1-2 mg total) by mouth at bedtime.  Primary insomnia -     ALPRAZolam (XANAX) 1 MG tablet; Take 1-2 tablets (1-2 mg total) by mouth at bedtime.  Other orders -     ALPRAZolam (ALPRAZOLAM XR) 1 MG 24 hr tablet; Take 1 tablet (1 mg total) by mouth at bedtime.     Please see After Visit Summary for patient specific instructions.  Future Appointments  Date Time Provider Department Center  03/13/2023  4:00 PM Corie Chiquito, PMHNP [facility] None    No orders of the defined types were placed in this encounter.   -------------------------------

## 2023-02-20 ENCOUNTER — Other Ambulatory Visit: Payer: Self-pay

## 2023-02-24 ENCOUNTER — Other Ambulatory Visit: Payer: Self-pay

## 2023-02-27 ENCOUNTER — Encounter: Payer: Self-pay | Admitting: Oncology

## 2023-02-27 ENCOUNTER — Other Ambulatory Visit: Payer: Self-pay

## 2023-02-27 ENCOUNTER — Other Ambulatory Visit (HOSPITAL_COMMUNITY): Payer: Self-pay

## 2023-02-28 ENCOUNTER — Encounter: Payer: Self-pay | Admitting: Oncology

## 2023-02-28 ENCOUNTER — Other Ambulatory Visit: Payer: Self-pay

## 2023-03-02 ENCOUNTER — Other Ambulatory Visit: Payer: Self-pay | Admitting: Family Medicine

## 2023-03-02 ENCOUNTER — Telehealth: Payer: Self-pay | Admitting: Family Medicine

## 2023-03-02 DIAGNOSIS — J301 Allergic rhinitis due to pollen: Secondary | ICD-10-CM

## 2023-03-03 ENCOUNTER — Other Ambulatory Visit: Payer: Self-pay

## 2023-03-03 ENCOUNTER — Other Ambulatory Visit: Payer: Self-pay | Admitting: Family Medicine

## 2023-03-03 DIAGNOSIS — J301 Allergic rhinitis due to pollen: Secondary | ICD-10-CM

## 2023-03-03 MED FILL — Azelastine HCl-Fluticasone Prop Nasal Spray 137-50 MCG/ACT: NASAL | 30 days supply | Qty: 23 | Fill #0 | Status: CN

## 2023-03-03 NOTE — Telephone Encounter (Signed)
Pt wants Dr Sherrie Mustache to know the reason she needs the pain meds she is dealing with neck pain.  She states Dr Sherrie Mustache is aware of her She is having breakthrough pain. Pt  has a court date on 03/11/2023.

## 2023-03-04 ENCOUNTER — Other Ambulatory Visit: Payer: Self-pay

## 2023-03-05 ENCOUNTER — Other Ambulatory Visit: Payer: Self-pay

## 2023-03-05 MED FILL — Oxycodone w/ Acetaminophen Tab 5-325 MG: ORAL | 7 days supply | Qty: 7 | Fill #0 | Status: AC

## 2023-03-05 MED FILL — Promethazine HCl Tab 25 MG: ORAL | 10 days supply | Qty: 30 | Fill #0 | Status: AC

## 2023-03-06 ENCOUNTER — Other Ambulatory Visit: Payer: Self-pay

## 2023-03-06 ENCOUNTER — Ambulatory Visit: Payer: BC Managed Care – PPO | Admitting: Psychiatry

## 2023-03-06 ENCOUNTER — Other Ambulatory Visit: Payer: Self-pay | Admitting: Family Medicine

## 2023-03-06 DIAGNOSIS — F1190 Opioid use, unspecified, uncomplicated: Secondary | ICD-10-CM

## 2023-03-06 DIAGNOSIS — M961 Postlaminectomy syndrome, not elsewhere classified: Secondary | ICD-10-CM

## 2023-03-06 MED ORDER — HYDROCODONE-ACETAMINOPHEN 10-325 MG PO TABS
1.0000 | ORAL_TABLET | Freq: Four times a day (QID) | ORAL | 0 refills | Status: DC | PRN
Start: 2023-03-06 — End: 2023-04-01
  Filled 2023-03-06 – 2023-03-07 (×2): qty 100, 25d supply, fill #0

## 2023-03-07 ENCOUNTER — Telehealth (INDEPENDENT_AMBULATORY_CARE_PROVIDER_SITE_OTHER): Payer: Commercial Managed Care - PPO | Admitting: Family Medicine

## 2023-03-07 ENCOUNTER — Other Ambulatory Visit: Payer: Self-pay

## 2023-03-07 DIAGNOSIS — Z91199 Patient's noncompliance with other medical treatment and regimen due to unspecified reason: Secondary | ICD-10-CM

## 2023-03-07 MED FILL — Azelastine HCl-Fluticasone Prop Nasal Spray 137-50 MCG/ACT: NASAL | 30 days supply | Qty: 23 | Fill #0 | Status: CN

## 2023-03-07 NOTE — Progress Notes (Signed)
No visit, appt cancelled by patient

## 2023-03-09 ENCOUNTER — Other Ambulatory Visit: Payer: Self-pay

## 2023-03-10 ENCOUNTER — Other Ambulatory Visit: Payer: Self-pay

## 2023-03-13 ENCOUNTER — Encounter: Payer: Self-pay | Admitting: Psychiatry

## 2023-03-13 ENCOUNTER — Other Ambulatory Visit: Payer: Self-pay

## 2023-03-13 ENCOUNTER — Telehealth (INDEPENDENT_AMBULATORY_CARE_PROVIDER_SITE_OTHER): Payer: Commercial Managed Care - PPO | Admitting: Psychiatry

## 2023-03-13 DIAGNOSIS — F5101 Primary insomnia: Secondary | ICD-10-CM

## 2023-03-13 DIAGNOSIS — F909 Attention-deficit hyperactivity disorder, unspecified type: Secondary | ICD-10-CM

## 2023-03-13 DIAGNOSIS — F411 Generalized anxiety disorder: Secondary | ICD-10-CM

## 2023-03-13 MED ORDER — ALPRAZOLAM ER 1 MG PO TB24
1.0000 mg | ORAL_TABLET | Freq: Every day | ORAL | 2 refills | Status: DC
Start: 2023-04-16 — End: 2023-06-13
  Filled 2023-03-13 – 2023-03-20 (×2): qty 30, 30d supply, fill #0
  Filled 2023-04-17: qty 30, 30d supply, fill #1
  Filled 2023-05-08 – 2023-05-14 (×4): qty 30, 30d supply, fill #2

## 2023-03-13 MED ORDER — ALPRAZOLAM 1 MG PO TABS
1.0000 mg | ORAL_TABLET | Freq: Every day | ORAL | 2 refills | Status: DC
Start: 2023-04-21 — End: 2023-06-16
  Filled 2023-03-13: qty 60, 60d supply, fill #0
  Filled 2023-03-24: qty 60, 30d supply, fill #0
  Filled 2023-04-21: qty 60, 30d supply, fill #1
  Filled 2023-05-15 – 2023-05-19 (×4): qty 60, 30d supply, fill #2
  Filled ????-??-??: fill #2
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
## 2023-03-14 ENCOUNTER — Other Ambulatory Visit: Payer: Self-pay

## 2023-03-16 ENCOUNTER — Other Ambulatory Visit: Payer: Self-pay | Admitting: Family Medicine

## 2023-03-16 DIAGNOSIS — E538 Deficiency of other specified B group vitamins: Secondary | ICD-10-CM

## 2023-03-16 MED FILL — Levothyroxine Sodium Tab 150 MCG: ORAL | 90 days supply | Qty: 90 | Fill #1 | Status: AC

## 2023-03-17 ENCOUNTER — Other Ambulatory Visit: Payer: Self-pay

## 2023-03-17 MED ORDER — CYANOCOBALAMIN 1000 MCG/ML IJ SOLN
1000.0000 ug | INTRAMUSCULAR | 4 refills | Status: AC
Start: 2023-03-17 — End: ?
  Filled 2023-03-17: qty 6, 84d supply, fill #0
  Filled 2023-07-08: qty 6, 84d supply, fill #1
  Filled 2023-10-12: qty 6, 84d supply, fill #2

## 2023-03-17 MED ORDER — OXYCODONE-ACETAMINOPHEN 5-325 MG PO TABS
1.0000 | ORAL_TABLET | Freq: Every day | ORAL | 0 refills | Status: DC | PRN
  Filled 2023-03-17: qty 7, 7d supply, fill #0

## 2023-03-18 ENCOUNTER — Other Ambulatory Visit: Payer: Self-pay | Admitting: Family Medicine

## 2023-03-18 ENCOUNTER — Other Ambulatory Visit: Payer: Self-pay

## 2023-03-19 ENCOUNTER — Other Ambulatory Visit: Payer: Self-pay

## 2023-03-19 MED ORDER — CELECOXIB 100 MG PO CAPS
100.0000 mg | ORAL_CAPSULE | Freq: Two times a day (BID) | ORAL | 2 refills | Status: DC | PRN
  Filled 2023-03-19: qty 30, 15d supply, fill #0
  Filled 2023-04-23: qty 30, 15d supply, fill #1

## 2023-03-20 ENCOUNTER — Other Ambulatory Visit: Payer: Self-pay

## 2023-03-24 ENCOUNTER — Other Ambulatory Visit: Payer: Self-pay

## 2023-03-25 ENCOUNTER — Other Ambulatory Visit: Payer: Self-pay

## 2023-04-01 ENCOUNTER — Other Ambulatory Visit: Payer: Self-pay | Admitting: Family Medicine

## 2023-04-01 DIAGNOSIS — M961 Postlaminectomy syndrome, not elsewhere classified: Secondary | ICD-10-CM

## 2023-04-01 DIAGNOSIS — F1190 Opioid use, unspecified, uncomplicated: Secondary | ICD-10-CM

## 2023-04-02 ENCOUNTER — Other Ambulatory Visit: Payer: Self-pay

## 2023-04-02 MED ORDER — HYDROCODONE-ACETAMINOPHEN 10-325 MG PO TABS
1.0000 | ORAL_TABLET | Freq: Four times a day (QID) | ORAL | 0 refills | Status: DC | PRN
Start: 2023-04-02 — End: 2023-04-18
  Filled 2023-04-02: qty 100, 25d supply, fill #0

## 2023-04-09 ENCOUNTER — Other Ambulatory Visit: Payer: Self-pay

## 2023-04-09 ENCOUNTER — Other Ambulatory Visit: Payer: Self-pay | Admitting: Family Medicine

## 2023-04-09 ENCOUNTER — Encounter: Payer: Self-pay | Admitting: Oncology

## 2023-04-09 DIAGNOSIS — E039 Hypothyroidism, unspecified: Secondary | ICD-10-CM

## 2023-04-09 MED ORDER — CETIRIZINE HCL 10 MG PO TABS
10.0000 mg | ORAL_TABLET | Freq: Every day | ORAL | 2 refills | Status: DC | PRN
  Filled 2023-04-09: qty 90, 90d supply, fill #0
  Filled 2023-06-11 – 2023-07-08 (×2): qty 90, 90d supply, fill #1
  Filled 2023-08-10: qty 90, 90d supply, fill #2

## 2023-04-09 MED FILL — Promethazine HCl Tab 25 MG: ORAL | 10 days supply | Qty: 30 | Fill #1 | Status: AC

## 2023-04-09 MED FILL — Azelastine HCl-Fluticasone Prop Nasal Spray 137-50 MCG/ACT: NASAL | 30 days supply | Qty: 23 | Fill #0 | Status: CN

## 2023-04-10 ENCOUNTER — Other Ambulatory Visit: Payer: Self-pay

## 2023-04-10 MED ORDER — OXYCODONE-ACETAMINOPHEN 5-325 MG PO TABS
1.0000 | ORAL_TABLET | Freq: Every day | ORAL | 0 refills | Status: DC | PRN
  Filled 2023-04-10: qty 7, 7d supply, fill #0

## 2023-04-10 MED ORDER — LEVOTHYROXINE SODIUM 150 MCG PO TABS
150.0000 ug | ORAL_TABLET | Freq: Every day | ORAL | 1 refills | Status: DC
Start: 2023-04-10 — End: 2024-04-24
  Filled 2023-04-10 – 2023-07-08 (×4): qty 90, 90d supply, fill #0
  Filled 2023-11-12: qty 90, 90d supply, fill #1

## 2023-04-12 DIAGNOSIS — R2689 Other abnormalities of gait and mobility: Secondary | ICD-10-CM | POA: Diagnosis not present

## 2023-04-12 DIAGNOSIS — R4781 Slurred speech: Secondary | ICD-10-CM | POA: Diagnosis not present

## 2023-04-12 DIAGNOSIS — G4700 Insomnia, unspecified: Secondary | ICD-10-CM | POA: Diagnosis not present

## 2023-04-12 DIAGNOSIS — Z9884 Bariatric surgery status: Secondary | ICD-10-CM | POA: Diagnosis not present

## 2023-04-12 DIAGNOSIS — Z86718 Personal history of other venous thrombosis and embolism: Secondary | ICD-10-CM | POA: Diagnosis not present

## 2023-04-12 DIAGNOSIS — I639 Cerebral infarction, unspecified: Secondary | ICD-10-CM | POA: Diagnosis not present

## 2023-04-12 DIAGNOSIS — F10129 Alcohol abuse with intoxication, unspecified: Secondary | ICD-10-CM | POA: Diagnosis not present

## 2023-04-12 DIAGNOSIS — R29703 NIHSS score 3: Secondary | ICD-10-CM | POA: Diagnosis not present

## 2023-04-12 DIAGNOSIS — R278 Other lack of coordination: Secondary | ICD-10-CM | POA: Diagnosis not present

## 2023-04-12 DIAGNOSIS — Z7901 Long term (current) use of anticoagulants: Secondary | ICD-10-CM | POA: Diagnosis not present

## 2023-04-12 DIAGNOSIS — F10120 Alcohol abuse with intoxication, uncomplicated: Secondary | ICD-10-CM | POA: Diagnosis not present

## 2023-04-12 DIAGNOSIS — I69919 Unspecified symptoms and signs involving cognitive functions following unspecified cerebrovascular disease: Secondary | ICD-10-CM | POA: Diagnosis not present

## 2023-04-12 DIAGNOSIS — R4182 Altered mental status, unspecified: Secondary | ICD-10-CM | POA: Diagnosis not present

## 2023-04-13 DIAGNOSIS — R4781 Slurred speech: Secondary | ICD-10-CM | POA: Diagnosis not present

## 2023-04-17 ENCOUNTER — Other Ambulatory Visit: Payer: Self-pay

## 2023-04-17 ENCOUNTER — Telehealth: Payer: Self-pay | Admitting: Family Medicine

## 2023-04-17 ENCOUNTER — Other Ambulatory Visit: Payer: Self-pay | Admitting: Family Medicine

## 2023-04-17 ENCOUNTER — Telehealth: Payer: Commercial Managed Care - PPO | Admitting: Physician Assistant

## 2023-04-17 ENCOUNTER — Encounter: Payer: Self-pay | Admitting: Oncology

## 2023-04-17 ENCOUNTER — Encounter: Payer: Self-pay | Admitting: Physician Assistant

## 2023-04-17 DIAGNOSIS — R6889 Other general symptoms and signs: Secondary | ICD-10-CM

## 2023-04-17 DIAGNOSIS — U071 COVID-19: Secondary | ICD-10-CM | POA: Diagnosis not present

## 2023-04-17 DIAGNOSIS — F1190 Opioid use, unspecified, uncomplicated: Secondary | ICD-10-CM

## 2023-04-17 DIAGNOSIS — J301 Allergic rhinitis due to pollen: Secondary | ICD-10-CM

## 2023-04-17 DIAGNOSIS — M961 Postlaminectomy syndrome, not elsewhere classified: Secondary | ICD-10-CM

## 2023-04-17 MED ORDER — PROMETHAZINE-DM 6.25-15 MG/5ML PO SYRP
5.0000 mL | ORAL_SOLUTION | Freq: Four times a day (QID) | ORAL | 0 refills | Status: AC | PRN
Start: 2023-04-17 — End: ?
  Filled 2023-04-17: qty 118, 6d supply, fill #0

## 2023-04-17 MED ORDER — MOLNUPIRAVIR EUA 200MG CAPSULE
4.0000 | ORAL_CAPSULE | Freq: Two times a day (BID) | ORAL | 0 refills | Status: AC
Start: 2023-04-17 — End: 2023-04-22
  Filled 2023-04-17: qty 40, 5d supply, fill #0

## 2023-04-17 MED ORDER — PREDNISONE 20 MG PO TABS
40.0000 mg | ORAL_TABLET | Freq: Every day | ORAL | 0 refills | Status: DC
Start: 2023-04-17 — End: 2023-06-19
  Filled 2023-04-17: qty 14, 7d supply, fill #0

## 2023-04-17 MED ORDER — ALBUTEROL SULFATE HFA 108 (90 BASE) MCG/ACT IN AERS
2.0000 | INHALATION_SPRAY | Freq: Four times a day (QID) | RESPIRATORY_TRACT | 1 refills | Status: DC | PRN
  Filled 2023-04-17: qty 6.7, 25d supply, fill #0

## 2023-04-17 NOTE — Progress Notes (Addendum)
Virtual Visit Consent   Michaela Jones, you are scheduled for a virtual visit with a [facility] provider today. Just as with appointments in the office, your consent must be obtained to participate. Your consent will be active for this visit and any virtual visit you may have with one of our providers in the next 365 days. If you have a MyChart account, a copy of this consent can be sent to you electronically.  As this is a virtual visit, video technology does not allow for your provider to perform a traditional examination. This may limit your provider's ability to fully assess your condition. If your provider identifies any concerns that need to be evaluated in person or the need to arrange testing (such as labs, EKG, etc.), we will make arrangements to do so. Although advances in technology are sophisticated, we cannot ensure that it will always work on either your end or our end. If the connection with a video visit is poor, the visit may have to be switched to a telephone visit. With either a video or telephone visit, we are not always able to ensure that we have a secure connection.  By engaging in this virtual visit, you consent to the provision of healthcare and authorize for your insurance to be billed (if applicable) for the services provided during this visit. Depending on your insurance coverage, you may receive a charge related to this service.  I need to obtain your verbal consent now. Are you willing to proceed with your visit today? RENISHA DIBATTISTA has provided verbal consent on 04/17/2023 for a virtual visit (video or telephone). [name], PA-C  Date: 04/17/2023 10:03 AM  Virtual Visit via Video Note   I, [name], connected with  SUMANA Jones  ([id number], Feb 05, 1976) on 04/17/23 at  8:30 AM EDT by a video-enabled telemedicine application and verified that I am speaking with the correct person using two identifiers.  Location: Patient: Virtual Visit Location  Patient: Mobile Provider: Virtual Visit Location Provider: Home Office   I discussed the limitations of evaluation and management by telemedicine and the availability of in person appointments. The patient expressed understanding and agreed to proceed.    History of Present Illness: Michaela Jones is a 47 y.o. who identifies as a female who was assigned female at birth, and is being seen today for tight chest and hoarse voice.  HPI: URI  This is a new problem. The current episode started in the past 7 days (Started Tuesday 04/15/23). Maximum temperature: subjective fevers. Associated symptoms include chest pain (tightness), congestion, coughing, ear pain, headaches, rhinorrhea, sinus pain (frontal), a sore throat (scratchy and mucus covered) and wheezing. Pertinent negatives include no plugged ear sensation. Associated symptoms comments: Fatigue, hoarse voice. Treatments tried: albuterol inhaler, cetirizine, singulair, mucinex dm. The treatment provided no relief.      Problems:  Patient Active Problem List   Diagnosis Date Noted   Drug reaction 02/06/2023   Auditory hallucination 02/06/2023   Medication reaction, initial encounter 02/06/2023   Common cold 12/04/2022   Morbid obesity (HCC) 12/04/2022   Sciatica of left side 11/11/2022   Non-recurrent acute suppurative otitis media of left ear without spontaneous rupture of tympanic membrane 11/04/2022   Acute cough 11/04/2022   B12 deficiency 05/27/2022   DVT of deep femoral vein (HCC) 05/26/2020   Chronic pain disorder 05/16/2020   Chronic, continuous use of opioids 05/16/2020   Anxiety 06/17/2018   Lumbar postlaminectomy syndrome 10/23/2017  Lumbosacral radiculopathy 10/23/2017   Vitamin D deficiency 03/01/2015   Acquired spondylolisthesis 03/01/2015   Elevated LDL cholesterol level 03/01/2015   Chronic constipation 03/01/2015   Dysthymia 11/11/2013   Absolute anemia 10/15/2012   Gastric bypass status for obesity 10/15/2012    Insomnia 09/03/2007   Allergic rhinitis 06/15/2007   Iron deficiency anemia 01/22/2007   Restless leg 09/23/2006   Headache, migraine 08/27/2006   Acquired lymphedema 07/29/1998   Adult hypothyroidism 07/29/1992    Allergies:  Allergies  Allergen Reactions   Lyrica [Pregabalin] Other (See Comments)    Patient reports neurological problem. Speech disturbances and unable to control motor skills   Penicillins Rash    Per Husband, Patient can tolerate Cephalosporins    Trazodone And Nefazodone Other (See Comments)    Possible hallucinations, Mania    Gabapentin Swelling   Nsaids Other (See Comments)    Avoids due to gastric bypass Avoids due to gastric bypass   Duloxetine Diarrhea, Nausea And Vomiting, Other (See Comments) and Photosensitivity   Azithromycin Other (See Comments)    Very low absorption due to bariatric surgery; often resulting in therapeutic failure.  Use alternative abx, such as doxycycline or clarithromycin.   Medications:  Current Outpatient Medications:    predniSONE (DELTASONE) 20 MG tablet, Take 2 tablets (40 mg total) by mouth daily with breakfast., Disp: 14 tablet, Rfl: 0   promethazine-dextromethorphan (PROMETHAZINE-DM) 6.25-15 MG/5ML syrup, Take 5 mLs by mouth 4 (four) times daily as needed., Disp: 118 mL, Rfl: 0   albuterol (VENTOLIN HFA) 108 (90 Base) MCG/ACT inhaler, Inhale 2 puffs into the lungs every 6 (six) hours as needed for wheezing or shortness of breath., Disp: 6.7 g, Rfl: 1   ALPRAZolam (ALPRAZOLAM XR) 1 MG 24 hr tablet, Take 1 tablet (1 mg total) by mouth at bedtime., Disp: 30 tablet, Rfl: 2   [START ON 04/21/2023] ALPRAZolam (XANAX) 1 MG tablet, Take 1 tablet (1 mg total) by mouth at bedtime. May take an additional tablet as needed, Disp: 60 tablet, Rfl: 2   apixaban (ELIQUIS) 5 MG TABS tablet, Take 1 tablet (5 mg total) by mouth 2 (two) times daily., Disp: 60 tablet, Rfl: 11   Armodafinil 150 MG tablet, Take 1 tablet (150 mg total) by mouth  daily., Disp: 30 tablet, Rfl: 5   Azelastine-Fluticasone 137-50 MCG/ACT SUSP, Place 1 spray into the nose every 12 (twelve) hours., Disp: 23 g, Rfl: 0   celecoxib (CELEBREX) 100 MG capsule, Take 1 capsule (100 mg total) by mouth 2 (two) times daily as needed., Disp: 30 capsule, Rfl: 2   cetirizine (ZYRTEC) 10 MG tablet, Take 1 tablet (10 mg total) by mouth daily as needed for allergies., Disp: 90 tablet, Rfl: 2   cholecalciferol (VITAMIN D3) 25 MCG (1000 UNIT) tablet, Take 1,000 Units by mouth daily., Disp: , Rfl:    cyanocobalamin (VITAMIN B12) 1000 MCG/ML injection, Inject 1 mL (1,000 mcg total) into the skin every 14 (fourteen) days (2 weeks) for 3 months then 1ml once a month, Disp: 6 mL, Rfl: 4   HYDROcodone-acetaminophen (NORCO) 10-325 MG tablet, Take 1 tablet by mouth 4 (four) times daily as needed., Disp: 100 tablet, Rfl: 0   levothyroxine (SYNTHROID) 150 MCG tablet, Take 1 tablet (150 mcg total) by mouth daily. Please schedule an office visit before anymore refills., Disp: 90 tablet, Rfl: 1   methocarbamol (ROBAXIN) 500 MG tablet, Take 1 tablet (500 mg total) by mouth 2 (two) times daily., Disp: 60 tablet, Rfl: 3  montelukast (SINGULAIR) 10 MG tablet, Take 1 tablet (10 mg total) by mouth at bedtime., Disp: 30 tablet, Rfl: 3   Multiple Vitamin (MULTIVITAMIN) tablet, Take 1 tablet by mouth daily., Disp: , Rfl:    ondansetron (ZOFRAN) 4 MG tablet, Take 1 tablet (4 mg total) by mouth every 8 (eight) hours as needed for nausea or vomiting., Disp: 20 tablet, Rfl: 0   oxyCODONE-acetaminophen (PERCOCET/ROXICET) 5-325 MG tablet, Take 1 tablet by mouth daily as needed., Disp: 7 tablet, Rfl: 0   promethazine (PHENERGAN) 25 MG tablet, Take 1 tablet (25 mg total) by mouth every 8 (eight) hours as needed for nausea or vomiting., Disp: 30 tablet, Rfl: 2   Rimegepant Sulfate (NURTEC) 75 MG TBDP, Take 1 tablet (75 mg total) by mouth every other day., Disp: 20 tablet, Rfl: 3  Observations/Objective: Patient  is well-developed, well-nourished in no acute distress.  Resting comfortably Head is normocephalic, atraumatic.  No labored breathing. Speech is clear and coherent with logical content.  Patient is alert and oriented at baseline.    Assessment and Plan: 1. Flu-like symptoms - predniSONE (DELTASONE) 20 MG tablet; Take 2 tablets (40 mg total) by mouth daily with breakfast.  Dispense: 14 tablet; Refill: 0 - albuterol (VENTOLIN HFA) 108 (90 Base) MCG/ACT inhaler; Inhale 2 puffs into the lungs every 6 (six) hours as needed for wheezing or shortness of breath.  Dispense: 6.7 g; Refill: 1 - promethazine-dextromethorphan (PROMETHAZINE-DM) 6.25-15 MG/5ML syrup; Take 5 mLs by mouth 4 (four) times daily as needed.  Dispense: 118 mL; Refill: 0  - Suspect viral URI; advised to take at home Covid 19 test - Albuterol, Prednisone, and Promethazine DM prescribed - Symptomatic medications of choice over the counter as needed - Push fluids - Rest - Can follow up if Covid 19 positive (already discussed Molnupiravir; patient on Eliquis) and for updated work note - Seek further evaluation if symptoms change or worsen  *Addend: Patient tested positive for Covid 19. Molnupiravir prescribed. Work note updated.   Follow Up Instructions: I discussed the assessment and treatment plan with the patient. The patient was provided an opportunity to ask questions and all were answered. The patient agreed with the plan and demonstrated an understanding of the instructions.  A copy of instructions were sent to the patient via MyChart unless otherwise noted below.    The patient was advised to call back or seek an in-person evaluation if the symptoms worsen or if the condition fails to improve as anticipated.  Time:  I spent 18 minutes with the patient via telehealth technology discussing the above problems/concerns.    [name], PA-C

## 2023-04-17 NOTE — Telephone Encounter (Signed)
Received a fax from covermymeds for Nurtec 75mg   Key:  [id number]

## 2023-04-17 NOTE — Addendum Note (Signed)
Addended by: [name] on: 04/17/2023 12:32 PM   Modules accepted: Orders

## 2023-04-17 NOTE — Patient Instructions (Signed)
Michaela Jones, thank you for joining [name], PA-C for today's virtual visit.  While this provider is not your primary care provider (PCP), if your PCP is located in our provider database this encounter information will be shared with them immediately following your visit.   A [facility] MyChart account gives you access to today's visit and all your visits, tests, and labs performed at [facility] " click here if you don't have a [facility] MyChart account or go to [email or web address]  Consent: (Patient) Michaela Jones provided verbal consent for this virtual visit at the beginning of the encounter.  Current Medications:  Current Outpatient Medications:    predniSONE (DELTASONE) 20 MG tablet, Take 2 tablets (40 mg total) by mouth daily with breakfast., Disp: 14 tablet, Rfl: 0   promethazine-dextromethorphan (PROMETHAZINE-DM) 6.25-15 MG/5ML syrup, Take 5 mLs by mouth 4 (four) times daily as needed., Disp: 118 mL, Rfl: 0   albuterol (VENTOLIN HFA) 108 (90 Base) MCG/ACT inhaler, Inhale 2 puffs into the lungs every 6 (six) hours as needed for wheezing or shortness of breath., Disp: 6.7 g, Rfl: 1   ALPRAZolam (ALPRAZOLAM XR) 1 MG 24 hr tablet, Take 1 tablet (1 mg total) by mouth at bedtime., Disp: 30 tablet, Rfl: 2   [START ON 04/21/2023] ALPRAZolam (XANAX) 1 MG tablet, Take 1 tablet (1 mg total) by mouth at bedtime. May take an additional tablet as needed, Disp: 60 tablet, Rfl: 2   apixaban (ELIQUIS) 5 MG TABS tablet, Take 1 tablet (5 mg total) by mouth 2 (two) times daily., Disp: 60 tablet, Rfl: 11   Armodafinil 150 MG tablet, Take 1 tablet (150 mg total) by mouth daily., Disp: 30 tablet, Rfl: 5   Azelastine-Fluticasone 137-50 MCG/ACT SUSP, Place 1 spray into the nose every 12 (twelve) hours., Disp: 23 g, Rfl: 0   celecoxib (CELEBREX) 100 MG capsule, Take 1 capsule (100 mg total) by mouth 2 (two) times daily as needed., Disp: 30 capsule, Rfl: 2   cetirizine (ZYRTEC) 10  MG tablet, Take 1 tablet (10 mg total) by mouth daily as needed for allergies., Disp: 90 tablet, Rfl: 2   cholecalciferol (VITAMIN D3) 25 MCG (1000 UNIT) tablet, Take 1,000 Units by mouth daily., Disp: , Rfl:    cyanocobalamin (VITAMIN B12) 1000 MCG/ML injection, Inject 1 mL (1,000 mcg total) into the skin every 14 (fourteen) days (2 weeks) for 3 months then 1ml once a month, Disp: 6 mL, Rfl: 4   HYDROcodone-acetaminophen (NORCO) 10-325 MG tablet, Take 1 tablet by mouth 4 (four) times daily as needed., Disp: 100 tablet, Rfl: 0   levothyroxine (SYNTHROID) 150 MCG tablet, Take 1 tablet (150 mcg total) by mouth daily. Please schedule an office visit before anymore refills., Disp: 90 tablet, Rfl: 1   methocarbamol (ROBAXIN) 500 MG tablet, Take 1 tablet (500 mg total) by mouth 2 (two) times daily., Disp: 60 tablet, Rfl: 3   montelukast (SINGULAIR) 10 MG tablet, Take 1 tablet (10 mg total) by mouth at bedtime., Disp: 30 tablet, Rfl: 3   Multiple Vitamin (MULTIVITAMIN) tablet, Take 1 tablet by mouth daily., Disp: , Rfl:    ondansetron (ZOFRAN) 4 MG tablet, Take 1 tablet (4 mg total) by mouth every 8 (eight) hours as needed for nausea or vomiting., Disp: 20 tablet, Rfl: 0   oxyCODONE-acetaminophen (PERCOCET/ROXICET) 5-325 MG tablet, Take 1 tablet by mouth daily as needed., Disp: 7 tablet, Rfl: 0   promethazine (PHENERGAN) 25 MG tablet, Take 1 tablet (25 mg  total) by mouth every 8 (eight) hours as needed for nausea or vomiting., Disp: 30 tablet, Rfl: 2   Rimegepant Sulfate (NURTEC) 75 MG TBDP, Take 1 tablet (75 mg total) by mouth every other day., Disp: 20 tablet, Rfl: 3   Medications ordered in this encounter:  Meds ordered this encounter  Medications   predniSONE (DELTASONE) 20 MG tablet    Sig: Take 2 tablets (40 mg total) by mouth daily with breakfast.    Dispense:  14 tablet    Refill:  0    Order Specific Question:   Supervising Provider    Answer:   Merrilee Jansky [[id number]]   albuterol  (VENTOLIN HFA) 108 (90 Base) MCG/ACT inhaler    Sig: Inhale 2 puffs into the lungs every 6 (six) hours as needed for wheezing or shortness of breath.    Dispense:  6.7 g    Refill:  1    Order Specific Question:   Supervising Provider    Answer:   Merrilee Jansky [id number]   promethazine-dextromethorphan (PROMETHAZINE-DM) 6.25-15 MG/5ML syrup    Sig: Take 5 mLs by mouth 4 (four) times daily as needed.    Dispense:  118 mL    Refill:  0    Order Specific Question:   Supervising Provider    Answer:   Merrilee Jansky [[id number]]     *If you need refills on other medications prior to your next appointment, please contact your pharmacy*  Follow-Up: Call back or seek an in-person evaluation if the symptoms worsen or if the condition fails to improve as anticipated.  [facility] [phone]  Care Instructions: Can take to lessen severity: Vit C 500mg  twice daily Quercertin 250-500mg  twice daily Zinc 75-100mg  daily Melatonin 3-6 mg at bedtime Vit D3 1000-2000 IU daily Aspirin 81 mg daily with food Optional: Famotidine 20mg  daily Also can add tylenol/ibuprofen as needed for fevers and body aches May add Mucinex or Mucinex DM as needed for cough/congestion    Isolation Instructions: You are to isolate at home until you have been fever free for at least 24 hours without a fever-reducing medication, and symptoms have been steadily improving for 24 hours. At that time,  you can end isolation but need to mask for an additional 5 days.   If you must be around other household members who do not have symptoms, you need to make sure that both you and the family members are masking consistently with a high-quality mask.  If you note any worsening of symptoms despite treatment, please seek an in-person evaluation ASAP. If you note any significant shortness of breath or any chest pain, please seek ER evaluation. Please do not delay care!   COVID-19: What to Do if You Are  Sick If you test positive and are an older adult or someone who is at high risk of getting very sick from COVID-19, treatment may be available. Contact a healthcare provider right away after a positive test to determine if you are eligible, even if your symptoms are mild right now. You can also visit a Test to Treat location and, if eligible, receive a prescription from a provider. Don't delay: Treatment must be started within the first few days to be effective. If you have a fever, cough, or other symptoms, you might have COVID-19. Most people have mild illness and are able to recover at home. If you are sick: Keep track of your symptoms. If you have an emergency warning sign (including  trouble breathing), call 911. Steps to help prevent the spread of COVID-19 if you are sick If you are sick with COVID-19 or think you might have COVID-19, follow the steps below to care for yourself and to help protect other people in your home and community. Stay home except to get medical care Stay home. Most people with COVID-19 have mild illness and can recover at home without medical care. Do not leave your home, except to get medical care. Do not visit public areas and do not go to places where you are unable to wear a mask. Take care of yourself. Get rest and stay hydrated. Take over-the-counter medicines, such as acetaminophen, to help you feel better. Stay in touch with your doctor. Call before you get medical care. Be sure to get care if you have trouble breathing, or have any other emergency warning signs, or if you think it is an emergency. Avoid public transportation, ride-sharing, or taxis if possible. Get tested If you have symptoms of COVID-19, get tested. While waiting for test results, stay away from others, including staying apart from those living in your household. Get tested as soon as possible after your symptoms start. Treatments may be available for people with COVID-19 who are at risk for  becoming very sick. Don't delay: Treatment must be started early to be effective--some treatments must begin within 5 days of your first symptoms. Contact your healthcare provider right away if your test result is positive to determine if you are eligible. Self-tests are one of several options for testing for the virus that causes COVID-19 and may be more convenient than laboratory-based tests and point-of-care tests. Ask your healthcare provider or your local health department if you need help interpreting your test results. You can visit your state, tribal, local, and territorial health department's website to look for the latest local information on testing sites. Separate yourself from other people As much as possible, stay in a specific room and away from other people and pets in your home. If possible, you should use a separate bathroom. If you need to be around other people or animals in or outside of the home, wear a well-fitting mask. Tell your close contacts that they may have been exposed to COVID-19. An infected person can spread COVID-19 starting 48 hours (or 2 days) before the person has any symptoms or tests positive. By letting your close contacts know they may have been exposed to COVID-19, you are helping to protect everyone. See COVID-19 and Animals if you have questions about pets. If you are diagnosed with COVID-19, someone from the health department may call you. Answer the call to slow the spread. Monitor your symptoms Symptoms of COVID-19 include fever, cough, or other symptoms. Follow care instructions from your healthcare provider and local health department. Your local health authorities may give instructions on checking your symptoms and reporting information. When to seek emergency medical attention Look for emergency warning signs* for COVID-19. If someone is showing any of these signs, seek emergency medical care immediately: Trouble breathing Persistent pain or pressure  in the chest New confusion Inability to wake or stay awake Pale, gray, or blue-colored skin, lips, or nail beds, depending on skin tone *This list is not all possible symptoms. Please call your medical provider for any other symptoms that are severe or concerning to you. Call 911 or call ahead to your local emergency facility: Notify the operator that you are seeking care for someone who has or may have COVID-19.  Call ahead before visiting your doctor Call ahead. Many medical visits for routine care are being postponed or done by phone or telemedicine. If you have a medical appointment that cannot be postponed, call your doctor's office, and tell them you have or may have COVID-19. This will help the office protect themselves and other patients. If you are sick, wear a well-fitting mask You should wear a mask if you must be around other people or animals, including pets (even at home). Wear a mask with the best fit, protection, and comfort for you. You don't need to wear the mask if you are alone. If you can't put on a mask (because of trouble breathing, for example), cover your coughs and sneezes in some other way. Try to stay at least 6 feet away from other people. This will help protect the people around you. Masks should not be placed on young children under age 27 years, anyone who has trouble breathing, or anyone who is not able to remove the mask without help. Cover your coughs and sneezes Cover your mouth and nose with a tissue when you cough or sneeze. Throw away used tissues in a lined trash can. Immediately wash your hands with soap and water for at least 20 seconds. If soap and water are not available, clean your hands with an alcohol-based hand sanitizer that contains at least 60% alcohol. Clean your hands often Wash your hands often with soap and water for at least 20 seconds. This is especially important after blowing your nose, coughing, or sneezing; going to the bathroom; and  before eating or preparing food. Use hand sanitizer if soap and water are not available. Use an alcohol-based hand sanitizer with at least 60% alcohol, covering all surfaces of your hands and rubbing them together until they feel dry. Soap and water are the best option, especially if hands are visibly dirty. Avoid touching your eyes, nose, and mouth with unwashed hands. Handwashing Tips Avoid sharing personal household items Do not share dishes, drinking glasses, cups, eating utensils, towels, or bedding with other people in your home. Wash these items thoroughly after using them with soap and water or put in the dishwasher. Clean surfaces in your home regularly Clean and disinfect high-touch surfaces (for example, doorknobs, tables, handles, light switches, and countertops) in your "sick room" and bathroom. In shared spaces, you should clean and disinfect surfaces and items after each use by the person who is ill. If you are sick and cannot clean, a caregiver or other person should only clean and disinfect the area around you (such as your bedroom and bathroom) on an as needed basis. Your caregiver/other person should wait as long as possible (at least several hours) and wear a mask before entering, cleaning, and disinfecting shared spaces that you use. Clean and disinfect areas that may have blood, stool, or body fluids on them. Use household cleaners and disinfectants. Clean visible dirty surfaces with household cleaners containing soap or detergent. Then, use a household disinfectant. Use a product from [employer] List N: Disinfectants for Coronavirus (COVID-19). Be sure to follow the instructions on the label to ensure safe and effective use of the product. Many products recommend keeping the surface wet with a disinfectant for a certain period of time (look at "contact time" on the product label). You may also need to wear personal protective equipment, such as gloves, depending on the directions on  the product label. Immediately after disinfecting, wash your hands with soap and water for 20  seconds. For completed guidance on cleaning and disinfecting your home, visit Complete Disinfection Guidance. Take steps to improve ventilation at home Improve ventilation (air flow) at home to help prevent from spreading COVID-19 to other people in your household. Clear out COVID-19 virus particles in the air by opening windows, using air filters, and turning on fans in your home. Use this interactive tool to learn how to improve air flow in your home. When you can be around others after being sick with COVID-19 Deciding when you can be around others is different for different situations. Find out when you can safely end home isolation. For any additional questions about your care, contact your healthcare provider or state or local health department. 10/17/2020 Content source: [facility] for Immunization and Respiratory Diseases ([facility]), Division of Viral Diseases This information is not intended to replace advice given to you by your health care provider. Make sure you discuss any questions you have with your health care provider. Document Revised: 11/30/2020 Document Reviewed: 11/30/2020 Elsevier Patient Education  2022 [employer].     If you have been instructed to have an in-person evaluation today at a local Urgent Care facility, please use the link below. It will take you to a list of all of our available [facility], including address, phone number and hours of operation. Please do not delay care.  [facility]  If you or a family member do not have a primary care provider, use the link below to schedule a visit and establish care. When you choose a [facility] primary care physician or advanced practice provider, you gain a long-term partner in health. Find a Primary Care Provider  Learn more about [facility]'s in-office and virtual care options: [facility] - Get Care Now

## 2023-04-18 ENCOUNTER — Other Ambulatory Visit: Payer: Self-pay | Admitting: Family Medicine

## 2023-04-18 DIAGNOSIS — F1190 Opioid use, unspecified, uncomplicated: Secondary | ICD-10-CM

## 2023-04-18 DIAGNOSIS — M961 Postlaminectomy syndrome, not elsewhere classified: Secondary | ICD-10-CM

## 2023-04-21 ENCOUNTER — Other Ambulatory Visit: Payer: Self-pay

## 2023-04-21 NOTE — Telephone Encounter (Signed)
Requested medications are due for refill today.  yes  Requested medications are on the active medications list.  yes  Last refill. 04/02/2023 #100 0 rf  Future visit scheduled.   no  Notes to clinic.  Refill not delegated.    Requested Prescriptions  Pending Prescriptions Disp Refills   HYDROcodone-acetaminophen (NORCO) 10-325 MG tablet 100 tablet 0    Sig: Take 1 tablet by mouth 4 (four) times daily as needed.     Not Delegated - Analgesics:  Opioid Agonist Combinations Failed - 04/18/2023  7:56 PM      Failed - This refill cannot be delegated      Passed - Urine Drug Screen completed in last 360 days      Passed - Valid encounter within last 3 months    Recent Outpatient Visits           1 month ago No-show for appointment   [facility] Malva Limes, MD   2 months ago Chronic left shoulder pain   [place] [facility] Malva Limes, MD   3 months ago Left arm pain   [facility] Malva Limes, MD   4 months ago Acute non-recurrent frontal sinusitis    [facility] Malva Limes, MD   4 months ago Seasonal allergic rhinitis due to pollen   [facility] Jacky Kindle, FNP

## 2023-04-22 ENCOUNTER — Other Ambulatory Visit: Payer: Self-pay

## 2023-04-22 MED ORDER — HYDROCODONE-ACETAMINOPHEN 10-325 MG PO TABS
1.0000 | ORAL_TABLET | Freq: Four times a day (QID) | ORAL | 0 refills | Status: DC | PRN
Start: 2023-04-22 — End: 2023-05-16
  Filled 2023-04-22 – 2023-04-25 (×3): qty 100, 25d supply, fill #0

## 2023-04-22 NOTE — Telephone Encounter (Signed)
Information sent to [employer]

## 2023-04-22 NOTE — Telephone Encounter (Signed)
Outcome Approved today by [employer] 2017 The request has been approved. The authorization is effective from 04/17/2023 to 10/20/2023, as long as the member is enrolled in their current health plan. This has been approved for a max daily dosage of 0.6. A written notification letter will follow with additional details. Authorization Expiration Date: 10/19/2023 Drug Nurtec 75MG  dispersible tablets

## 2023-04-23 MED FILL — Azelastine HCl-Fluticasone Prop Nasal Spray 137-50 MCG/ACT: NASAL | 30 days supply | Qty: 23 | Fill #0 | Status: CN

## 2023-04-24 ENCOUNTER — Other Ambulatory Visit: Payer: Self-pay

## 2023-04-25 ENCOUNTER — Other Ambulatory Visit: Payer: Self-pay

## 2023-04-27 ENCOUNTER — Other Ambulatory Visit: Payer: Self-pay

## 2023-04-29 ENCOUNTER — Other Ambulatory Visit: Payer: Self-pay

## 2023-04-29 ENCOUNTER — Other Ambulatory Visit: Payer: Self-pay | Admitting: Family Medicine

## 2023-04-29 DIAGNOSIS — R6889 Other general symptoms and signs: Secondary | ICD-10-CM

## 2023-04-29 NOTE — Telephone Encounter (Signed)
Requested medication (s) are due for refill today: Yes  Requested medication (s) are on the active medication list: Yes  Last refill:  04/17/23  Future visit scheduled: No  Notes to clinic:  Unable to refill per protocol, last refill by another provider.      Requested Prescriptions  Pending Prescriptions Disp Refills   promethazine-dextromethorphan (PROMETHAZINE-DM) 6.25-15 MG/5ML syrup 118 mL 0    Sig: Take 5 mLs by mouth 4 (four) times daily as needed.     Ear, Nose, and Throat:  Antitussives/Expectorants Passed - 04/29/2023 10:51 AM      Passed - Valid encounter within last 12 months    Recent Outpatient Visits           1 month ago No-show for appointment   [facility] Malva Limes, MD   2 months ago Chronic left shoulder pain   [facility] Malva Limes, MD   3 months ago Left arm pain   [place] [facility] Malva Limes, MD   4 months ago Acute non-recurrent frontal sinusitis    [facility] Malva Limes, MD   4 months ago Seasonal allergic rhinitis due to pollen   [facility] Jacky Kindle, FNP

## 2023-04-30 ENCOUNTER — Encounter: Payer: Self-pay | Admitting: Oncology

## 2023-04-30 ENCOUNTER — Other Ambulatory Visit: Payer: Self-pay

## 2023-04-30 MED FILL — Promethazine-DM Syrup 6.25-15 MG/5ML: ORAL | 6 days supply | Qty: 118 | Fill #0 | Status: AC

## 2023-05-05 ENCOUNTER — Other Ambulatory Visit: Payer: Self-pay

## 2023-05-05 ENCOUNTER — Other Ambulatory Visit: Payer: Self-pay | Admitting: Family Medicine

## 2023-05-06 ENCOUNTER — Other Ambulatory Visit: Payer: Self-pay

## 2023-05-06 MED ORDER — OXYCODONE-ACETAMINOPHEN 5-325 MG PO TABS
1.0000 | ORAL_TABLET | Freq: Every day | ORAL | 0 refills | Status: DC | PRN
  Filled 2023-05-13: qty 7, 7d supply, fill #0

## 2023-05-08 ENCOUNTER — Other Ambulatory Visit: Payer: Self-pay | Admitting: Family Medicine

## 2023-05-08 DIAGNOSIS — F1190 Opioid use, unspecified, uncomplicated: Secondary | ICD-10-CM

## 2023-05-08 DIAGNOSIS — M961 Postlaminectomy syndrome, not elsewhere classified: Secondary | ICD-10-CM

## 2023-05-08 DIAGNOSIS — J301 Allergic rhinitis due to pollen: Secondary | ICD-10-CM

## 2023-05-08 MED FILL — Azelastine HCl-Fluticasone Prop Nasal Spray 137-50 MCG/ACT: NASAL | 30 days supply | Qty: 23 | Fill #0 | Status: AC

## 2023-05-09 ENCOUNTER — Other Ambulatory Visit: Payer: Self-pay

## 2023-05-09 ENCOUNTER — Other Ambulatory Visit: Payer: Self-pay | Admitting: Family Medicine

## 2023-05-09 DIAGNOSIS — M961 Postlaminectomy syndrome, not elsewhere classified: Secondary | ICD-10-CM

## 2023-05-09 DIAGNOSIS — F1190 Opioid use, unspecified, uncomplicated: Secondary | ICD-10-CM

## 2023-05-09 MED ORDER — MONTELUKAST SODIUM 10 MG PO TABS
10.0000 mg | ORAL_TABLET | Freq: Every day | ORAL | 0 refills | Status: DC
  Filled 2023-05-09: qty 90, 90d supply, fill #0

## 2023-05-09 NOTE — Telephone Encounter (Signed)
Requested Prescriptions  Pending Prescriptions Disp Refills   montelukast (SINGULAIR) 10 MG tablet 90 tablet 0    Sig: Take 1 tablet (10 mg total) by mouth at bedtime.     Pulmonology:  Leukotriene Inhibitors Passed - 05/08/2023 10:59 PM      Passed - Valid encounter within last 12 months    Recent Outpatient Visits           2 months ago No-show for appointment   [facility] Malva Limes, MD   3 months ago Chronic left shoulder pain   [facility] [facility] Malva Limes, MD   4 months ago Left arm pain   [facility] [facility] Malva Limes, MD   5 months ago Acute non-recurrent frontal sinusitis   [place] Malva Limes, MD   5 months ago Seasonal allergic rhinitis due to pollen   [facility] Jacky Kindle, FNP

## 2023-05-11 ENCOUNTER — Other Ambulatory Visit: Payer: Self-pay

## 2023-05-13 ENCOUNTER — Encounter: Payer: Self-pay | Admitting: Oncology

## 2023-05-13 ENCOUNTER — Other Ambulatory Visit: Payer: Self-pay

## 2023-05-14 ENCOUNTER — Other Ambulatory Visit: Payer: Self-pay

## 2023-05-15 ENCOUNTER — Other Ambulatory Visit: Payer: Self-pay | Admitting: Family Medicine

## 2023-05-15 ENCOUNTER — Other Ambulatory Visit: Payer: Self-pay

## 2023-05-15 DIAGNOSIS — F1190 Opioid use, unspecified, uncomplicated: Secondary | ICD-10-CM

## 2023-05-15 DIAGNOSIS — M961 Postlaminectomy syndrome, not elsewhere classified: Secondary | ICD-10-CM

## 2023-05-16 ENCOUNTER — Other Ambulatory Visit: Payer: Self-pay

## 2023-05-16 ENCOUNTER — Encounter: Payer: Self-pay | Admitting: Oncology

## 2023-05-16 MED FILL — Hydrocodone-Acetaminophen Tab 10-325 MG: ORAL | 25 days supply | Qty: 100 | Fill #0 | Status: CN

## 2023-05-19 ENCOUNTER — Other Ambulatory Visit: Payer: Self-pay

## 2023-05-19 ENCOUNTER — Encounter: Payer: Self-pay | Admitting: Oncology

## 2023-05-19 MED FILL — Hydrocodone-Acetaminophen Tab 10-325 MG: ORAL | 25 days supply | Qty: 100 | Fill #0 | Status: AC

## 2023-05-19 MED FILL — Hydrocodone-Acetaminophen Tab 10-325 MG: ORAL | 25 days supply | Qty: 100 | Fill #0 | Status: CN

## 2023-06-03 ENCOUNTER — Encounter: Payer: Self-pay | Admitting: Family Medicine

## 2023-06-05 ENCOUNTER — Encounter: Payer: Self-pay | Admitting: Oncology

## 2023-06-06 ENCOUNTER — Other Ambulatory Visit: Payer: Self-pay | Admitting: Family Medicine

## 2023-06-06 DIAGNOSIS — M961 Postlaminectomy syndrome, not elsewhere classified: Secondary | ICD-10-CM

## 2023-06-06 DIAGNOSIS — F1190 Opioid use, unspecified, uncomplicated: Secondary | ICD-10-CM

## 2023-06-06 NOTE — Telephone Encounter (Signed)
Requested medication (s) are due for refill today - provider review   Requested medication (s) are on the active medication list -yes  Future visit scheduled -no  Last refill: 05/16/23 #100  Notes to clinic: non delegated Rx  Requested Prescriptions  Pending Prescriptions Disp Refills   HYDROcodone-acetaminophen (NORCO) 10-325 MG tablet 100 tablet 0    Sig: Take 1 tablet by mouth 4 (four) times daily as needed.     Not Delegated - Analgesics:  Opioid Agonist Combinations Failed - 06/06/2023 11:51 AM      Failed - This refill cannot be delegated      Failed - Valid encounter within last 3 months    Recent Outpatient Visits           3 months ago No-show for appointment   [facility] Malva Limes, MD   4 months ago Chronic left shoulder pain   [facility] Malva Limes, MD   5 months ago Left arm pain   [place] [facility] Malva Limes, MD   5 months ago Acute non-recurrent frontal sinusitis   [place] [facility] Malva Limes, MD   6 months ago Seasonal allergic rhinitis due to pollen   [facility] Jacky Kindle, FNP              Passed - Urine Drug Screen completed in last 360 days         Requested Prescriptions  Pending Prescriptions Disp Refills   HYDROcodone-acetaminophen (NORCO) 10-325 MG tablet 100 tablet 0    Sig: Take 1 tablet by mouth 4 (four) times daily as needed.     Not Delegated - Analgesics:  Opioid Agonist Combinations Failed - 06/06/2023 11:51 AM      Failed - This refill cannot be delegated      Failed - Valid encounter within last 3 months    Recent Outpatient Visits           3 months ago No-show for appointment   [facility] Malva Limes, MD   4 months ago Chronic left shoulder pain   [place] [facility] Malva Limes, MD   5 months ago Left arm pain    [place] [facility] Malva Limes, MD   5 months ago Acute non-recurrent frontal sinusitis    [facility] Malva Limes, MD   6 months ago Seasonal allergic rhinitis due to pollen   [facility] Jacky Kindle, FNP              Passed - Urine Drug Screen completed in last 360 days

## 2023-06-06 NOTE — Telephone Encounter (Signed)
Medication Refill - *insurance is requiring a prior authorization for refill Most Recent Primary Care Visit:  Provider: Malva Limes  Department: [facility]  Visit Type: HOSPITAL FU  Date: 02/04/2023  Medication: HYDROcodone-acetaminophen (NORCO) 10-325 MG tablet   Has the patient contacted their pharmacy? Yes (Agent: If no, request that the patient contact the pharmacy for the refill. If patient does not wish to contact the pharmacy document the reason why and proceed with request.) (Agent: If yes, when and what did the pharmacy advise?)  Is this the correct pharmacy for this prescription? Yes If no, delete pharmacy and type the correct one.  This is the patient's preferred pharmacy:  [facility] Phone: [phone]  Fax: [phone]      Has the prescription been filled recently? Yes  Is the patient out of the medication? No  Has the patient been seen for an appointment in the last year OR does the patient have an upcoming appointment? Yes  Can we respond through MyChart? Yes  Agent: Please be advised that Rx refills may take up to 3 business days. We ask that you follow-up with your pharmacy.

## 2023-06-08 ENCOUNTER — Other Ambulatory Visit: Payer: Self-pay

## 2023-06-08 MED ORDER — HYDROCODONE-ACETAMINOPHEN 10-325 MG PO TABS
1.0000 | ORAL_TABLET | Freq: Four times a day (QID) | ORAL | 0 refills | Status: DC | PRN
  Filled 2023-06-08 – 2023-06-11 (×2): qty 100, 25d supply, fill #0

## 2023-06-10 ENCOUNTER — Other Ambulatory Visit: Payer: Self-pay

## 2023-06-10 ENCOUNTER — Other Ambulatory Visit: Payer: Self-pay | Admitting: Psychiatry

## 2023-06-10 DIAGNOSIS — F411 Generalized anxiety disorder: Secondary | ICD-10-CM

## 2023-06-10 DIAGNOSIS — F5101 Primary insomnia: Secondary | ICD-10-CM

## 2023-06-10 NOTE — Telephone Encounter (Signed)
Due 11/14

## 2023-06-11 ENCOUNTER — Other Ambulatory Visit: Payer: Self-pay

## 2023-06-11 ENCOUNTER — Encounter: Payer: Self-pay | Admitting: Psychiatry

## 2023-06-11 ENCOUNTER — Encounter: Payer: Self-pay | Admitting: Oncology

## 2023-06-11 ENCOUNTER — Other Ambulatory Visit: Payer: Self-pay | Admitting: Family Medicine

## 2023-06-11 ENCOUNTER — Other Ambulatory Visit: Payer: Self-pay | Admitting: Psychiatry

## 2023-06-11 DIAGNOSIS — J301 Allergic rhinitis due to pollen: Secondary | ICD-10-CM

## 2023-06-11 DIAGNOSIS — F411 Generalized anxiety disorder: Secondary | ICD-10-CM

## 2023-06-11 DIAGNOSIS — F5101 Primary insomnia: Secondary | ICD-10-CM

## 2023-06-11 NOTE — Telephone Encounter (Signed)
Cover my meds page been down since Monday.

## 2023-06-12 ENCOUNTER — Other Ambulatory Visit: Payer: Self-pay | Admitting: Family Medicine

## 2023-06-12 ENCOUNTER — Other Ambulatory Visit: Payer: Self-pay

## 2023-06-12 ENCOUNTER — Telehealth: Payer: Commercial Managed Care - PPO | Admitting: Psychiatry

## 2023-06-12 ENCOUNTER — Other Ambulatory Visit: Payer: Self-pay | Admitting: Psychiatry

## 2023-06-12 DIAGNOSIS — J301 Allergic rhinitis due to pollen: Secondary | ICD-10-CM

## 2023-06-12 DIAGNOSIS — F411 Generalized anxiety disorder: Secondary | ICD-10-CM

## 2023-06-12 DIAGNOSIS — F5101 Primary insomnia: Secondary | ICD-10-CM

## 2023-06-12 NOTE — Progress Notes (Signed)
Michaela Jones [id number] [birth year] 47 y.o.   Pt did not connect for scheduled video visit. Pt was also sent video link via text.

## 2023-06-12 NOTE — Telephone Encounter (Signed)
Due 11/18

## 2023-06-12 NOTE — Telephone Encounter (Signed)
Sent MyChart message to schedule FU. Was a no show today.

## 2023-06-12 NOTE — Telephone Encounter (Signed)
Requested Prescriptions  Refused Prescriptions Disp Refills   montelukast (SINGULAIR) 10 MG tablet 90 tablet 0    Sig: Take 1 tablet (10 mg total) by mouth at bedtime.     Pulmonology:  Leukotriene Inhibitors Passed - 06/12/2023 10:19 AM      Passed - Valid encounter within last 12 months    Recent Outpatient Visits           3 months ago No-show for appointment   [facility] Malva Limes, MD   4 months ago Chronic left shoulder pain   [facility] Malva Limes, MD   5 months ago Left arm pain   [facility] Malva Limes, MD   6 months ago Acute non-recurrent frontal sinusitis   [facility] Malva Limes, MD   6 months ago Seasonal allergic rhinitis due to pollen   [facility] Jacky Kindle, FNP

## 2023-06-13 ENCOUNTER — Other Ambulatory Visit: Payer: Self-pay

## 2023-06-13 MED FILL — Alprazolam Tab ER 24HR 1 MG: ORAL | 30 days supply | Qty: 30 | Fill #0 | Status: AC

## 2023-06-13 NOTE — Telephone Encounter (Signed)
Pt called at 9:30a.  She was on the mychart video yesterday and somehow missed the visit with Shanda Bumps.  She called back to r/s and to request her medication.  I advised her the regular Xanax is not due until 11/18, but she is also requesting refill of the Xanax XR to [facility].  Just an [place], pharmacy is only open M-F.     Next appt 11/27

## 2023-06-13 NOTE — Telephone Encounter (Signed)
I tried to pend the XR, however.... This order already has pended modifications, reorders, and/or refill requests.       ALPRAZolam (ALPRAZOLAM XR) 1 MG 24 hr tablet (Order [id number]) was ordered by Teena Dunk

## 2023-06-16 ENCOUNTER — Encounter: Payer: Self-pay | Admitting: Oncology

## 2023-06-16 ENCOUNTER — Other Ambulatory Visit: Payer: Self-pay

## 2023-06-16 ENCOUNTER — Telehealth (INDEPENDENT_AMBULATORY_CARE_PROVIDER_SITE_OTHER): Payer: BC Managed Care – PPO | Admitting: Family Medicine

## 2023-06-16 DIAGNOSIS — R059 Cough, unspecified: Secondary | ICD-10-CM | POA: Diagnosis not present

## 2023-06-16 DIAGNOSIS — J029 Acute pharyngitis, unspecified: Secondary | ICD-10-CM

## 2023-06-16 DIAGNOSIS — R509 Fever, unspecified: Secondary | ICD-10-CM

## 2023-06-16 DIAGNOSIS — R6889 Other general symptoms and signs: Secondary | ICD-10-CM

## 2023-06-16 MED ORDER — OSELTAMIVIR PHOSPHATE 75 MG PO CAPS
75.0000 mg | ORAL_CAPSULE | Freq: Every day | ORAL | 0 refills | Status: AC
  Filled 2023-06-16: qty 10, 10d supply, fill #0

## 2023-06-16 MED FILL — Alprazolam Tab 1 MG: ORAL | 30 days supply | Qty: 60 | Fill #0 | Status: AC

## 2023-06-16 NOTE — Progress Notes (Signed)
MyChart Video Visit    Virtual Visit via Video Note   This format is felt to be most appropriate for this patient at this time. Physical exam was limited by quality of the video and audio technology used for the visit.   Patient location: home Provider location: [facility]  I discussed the limitations of evaluation and management by telemedicine and the availability of in person appointments. The patient expressed understanding and agreed to proceed.  Patient: Michaela Jones   DOB: Jul 16, 1976   47 y.o. Female  MRN: [id number] Visit Date: 06/16/2023  Today's healthcare provider: [name], MD   No chief complaint on file.  Subjective    Discussed the use of AI scribe software for clinical note transcription with the patient, who gave verbal consent to proceed.  History of Present Illness   The patient began experiencing flu-like symptoms on Friday evening, with the onset of full symptoms by Saturday. She reported excessive sleepiness over the weekend, which was uncharacteristic for her. She has been experiencing a sore throat, nasal drip, and an intermittent severe cough. The patient took her temperature prior to the consultation, which was recorded at 101.47F. She last took hydrocodone at 4:30 AM on the day of the consultation.  The patient reported a sensation of pressure on her chest, likening it to someone sitting on it, although she believes the infection has not yet reached her chest. She has been using an inhaler that was prescribed during a previous illness. Over-the-counter medications, throat lozenges, and cough drops have been used for symptom management. Despite being advised against it, the patient has taken ibuprofen due to the severity of her discomfort. She has also been using hydrocodone as a source of Tylenol, being careful not to double up on Tylenol intake. The patient reported a lack of appetite and has not been eating well over the weekend.  The patient has not taken  a COVID test yet, as she had taken COVID antivirals about a month ago during a previous illness. During that illness, she was prescribed steroids, promethazine syrup for a cough, and a COVID test. The patient's daughter had similar symptoms the previous week, including a sore throat, fever, and body aches, which resolved over the weekend. The patient received a flu vaccine in October. She is considering taking Tamiflu for her current symptoms.        Current Outpatient Medications:    oseltamivir (TAMIFLU) 75 MG capsule, Take 1 capsule (75 mg total) by mouth daily for 10 days. If you develop symptoms of flu, take twice daily for remainder of prescription., Disp: 10 capsule, Rfl: 0   albuterol (VENTOLIN HFA) 108 (90 Base) MCG/ACT inhaler, Inhale 2 puffs into the lungs every 6 (six) hours as needed for wheezing or shortness of breath., Disp: 6.7 g, Rfl: 1   ALPRAZolam (XANAX XR) 1 MG 24 hr tablet, Take 1 tablet (1 mg total) by mouth at bedtime., Disp: 30 tablet, Rfl: 2   ALPRAZolam (XANAX) 1 MG tablet, Take 1 tablet (1 mg total) by mouth at bedtime. May take an additional tablet as needed, Disp: 60 tablet, Rfl: 2   apixaban (ELIQUIS) 5 MG TABS tablet, Take 1 tablet (5 mg total) by mouth 2 (two) times daily., Disp: 60 tablet, Rfl: 11   Armodafinil 150 MG tablet, Take 1 tablet (150 mg total) by mouth daily., Disp: 30 tablet, Rfl: 5   Azelastine-Fluticasone 137-50 MCG/ACT SUSP, Place 1 spray into the nose every 12 (twelve) hours., Disp:  23 g, Rfl: 0   celecoxib (CELEBREX) 100 MG capsule, Take 1 capsule (100 mg total) by mouth 2 (two) times daily as needed., Disp: 30 capsule, Rfl: 2   cetirizine (ZYRTEC) 10 MG tablet, Take 1 tablet (10 mg total) by mouth daily as needed for allergies., Disp: 90 tablet, Rfl: 2   cholecalciferol (VITAMIN D3) 25 MCG (1000 UNIT) tablet, Take 1,000 Units by mouth daily., Disp: , Rfl:    cyanocobalamin (VITAMIN B12) 1000 MCG/ML injection, Inject 1 mL (1,000 mcg total) into the  skin every 14 (fourteen) days (2 weeks) for 3 months then 1ml once a month, Disp: 6 mL, Rfl: 4   HYDROcodone-acetaminophen (NORCO) 10-325 MG tablet, Take 1 tablet by mouth 4 (four) times daily as needed., Disp: 100 tablet, Rfl: 0   levothyroxine (SYNTHROID) 150 MCG tablet, Take 1 tablet (150 mcg total) by mouth daily. Please schedule an office visit before anymore refills., Disp: 90 tablet, Rfl: 1   methocarbamol (ROBAXIN) 500 MG tablet, Take 1 tablet (500 mg total) by mouth 2 (two) times daily., Disp: 60 tablet, Rfl: 3   montelukast (SINGULAIR) 10 MG tablet, Take 1 tablet (10 mg total) by mouth at bedtime., Disp: 90 tablet, Rfl: 0   Multiple Vitamin (MULTIVITAMIN) tablet, Take 1 tablet by mouth daily., Disp: , Rfl:    ondansetron (ZOFRAN) 4 MG tablet, Take 1 tablet (4 mg total) by mouth every 8 (eight) hours as needed for nausea or vomiting., Disp: 20 tablet, Rfl: 0   oxyCODONE-acetaminophen (PERCOCET/ROXICET) 5-325 MG tablet, Take 1 tablet by mouth daily as needed., Disp: 7 tablet, Rfl: 0   predniSONE (DELTASONE) 20 MG tablet, Take 2 tablets (40 mg total) by mouth daily with breakfast., Disp: 14 tablet, Rfl: 0   promethazine (PHENERGAN) 25 MG tablet, Take 1 tablet (25 mg total) by mouth every 8 (eight) hours as needed for nausea or vomiting., Disp: 30 tablet, Rfl: 2   promethazine-dextromethorphan (PROMETHAZINE-DM) 6.25-15 MG/5ML syrup, Take 5 mLs by mouth 4 (four) times daily as needed., Disp: 118 mL, Rfl: 0   Rimegepant Sulfate (NURTEC) 75 MG TBDP, Take 1 tablet (75 mg total) by mouth every other day., Disp: 20 tablet, Rfl: 3      Objective    There were no vitals taken for this visit.    Physical Exam  Awake, alert, oriented x 3. In no apparent distress      Assessment & Plan        Influenza-like Illness Rapid onset of fever (101.9 F), cough, sore throat, and fatigue. Patient has been using an inhaler and over-the-counter medications for symptom relief. Patient received the  influenza vaccine in October. -Prescribe Tamiflu. -Advise patient to stay home from work until 24 hours fever-free. -Check temperature every 4-6 hours. -Consider Celebrex as an alternative to ibuprofen for fever management. -If symptoms worsen or secondary infection signs appear, patient should contact the office.    No follow-ups on file.     I discussed the assessment and treatment plan with the patient. The patient was provided an opportunity to ask questions and all were answered. The patient agreed with the plan and demonstrated an understanding of the instructions.   The patient was advised to call back or seek an in-person evaluation if the symptoms worsen or if the condition fails to improve as anticipated.  I provided  minutes of non-face-to-face time during this encounter.    [name], MD [facility] [phone] (phone) [phone] (fax)  [facility]

## 2023-06-16 NOTE — Patient Instructions (Signed)
.   Please review the attached list of medications and notify my office if there are any errors.   . Please bring all of your medications to every appointment so we can make sure that our medication list is the same as yours.   

## 2023-06-17 ENCOUNTER — Encounter: Payer: Self-pay | Admitting: Oncology

## 2023-06-18 ENCOUNTER — Other Ambulatory Visit: Payer: Self-pay

## 2023-06-19 ENCOUNTER — Telehealth: Payer: BC Managed Care – PPO | Admitting: Nurse Practitioner

## 2023-06-19 ENCOUNTER — Other Ambulatory Visit: Payer: Self-pay

## 2023-06-19 ENCOUNTER — Encounter: Payer: Self-pay | Admitting: Oncology

## 2023-06-19 ENCOUNTER — Ambulatory Visit: Payer: Self-pay

## 2023-06-19 ENCOUNTER — Telehealth: Payer: Self-pay

## 2023-06-19 DIAGNOSIS — J069 Acute upper respiratory infection, unspecified: Secondary | ICD-10-CM | POA: Diagnosis not present

## 2023-06-19 MED ORDER — PREDNISONE 20 MG PO TABS
20.0000 mg | ORAL_TABLET | Freq: Two times a day (BID) | ORAL | 0 refills | Status: AC
  Filled 2023-06-19: qty 10, 5d supply, fill #0

## 2023-06-19 MED ORDER — PROMETHAZINE-DM 6.25-15 MG/5ML PO SYRP
5.0000 mL | ORAL_SOLUTION | Freq: Four times a day (QID) | ORAL | 0 refills | Status: DC | PRN
  Filled 2023-06-19: qty 118, 6d supply, fill #0

## 2023-06-19 NOTE — Telephone Encounter (Signed)
 Copied from CRM [id number]. Topic: General - Other >> Jun 19, 2023  2:00 PM Ja-Kwan M wrote: Reason for CRM: Pt reports that she is not feeling any better and she was told to call back if the symptoms did not get better. Pt stated that her employer sent her home yesterday and she called out today so she would like to request that the note be extended to include Wednesday, Thursday, and Friday. Cb# [phone]

## 2023-06-19 NOTE — Progress Notes (Signed)
Virtual Visit Consent   Michaela Jones, you are scheduled for a virtual visit with a [facility] provider today. Just as with appointments in the office, your consent must be obtained to participate. Your consent will be active for this visit and any virtual visit you may have with one of our providers in the next 365 days. If you have a MyChart account, a copy of this consent can be sent to you electronically.  As this is a virtual visit, video technology does not allow for your provider to perform a traditional examination. This may limit your provider's ability to fully assess your condition. If your provider identifies any concerns that need to be evaluated in person or the need to arrange testing (such as labs, EKG, etc.), we will make arrangements to do so. Although advances in technology are sophisticated, we cannot ensure that it will always work on either your end or our end. If the connection with a video visit is poor, the visit may have to be switched to a telephone visit. With either a video or telephone visit, we are not always able to ensure that we have a secure connection.  By engaging in this virtual visit, you consent to the provision of healthcare and authorize for your insurance to be billed (if applicable) for the services provided during this visit. Depending on your insurance coverage, you may receive a charge related to this service.  I need to obtain your verbal consent now. Are you willing to proceed with your visit today? Michaela Jones has provided verbal consent on 06/19/2023 for a virtual visit (video or telephone). Michaela Simas, FNP  Date: 06/19/2023 5:14 PM  Virtual Visit via Video Note   I, Michaela Jones, connected with  Michaela Jones  ([id number], April 14, 1976) on 06/19/23 at  5:15 PM EST by a video-enabled telemedicine application and verified that I am speaking with the correct person using two identifiers.  Location: Patient: Virtual Visit Location Patient:  Home Provider: Virtual Visit Location Provider: Home Office   I discussed the limitations of evaluation and management by telemedicine and the availability of in person appointments. The patient expressed understanding and agreed to proceed.    History of Present Illness: Michaela Jones is a 47 y.o. who identifies as a female who was assigned female at birth, and is being seen today for follow up.  She was seen on 06/16/23 via a Video Visit by [facility]. She was treated with tamiflu at that time for flu like symptoms including a fever of 101.9   Today she has nasal congestion, dry cough, and sore throat   She has completed 4 days of Tamiflu  She has not been tested for flu  She did not take a COVID test but was treated fr COVID in September- was given anti-virals at that time  She has had a flu vaccine this year   She does have a history of reactive airway and has an Albuterol inhaler that she has used for relief      Problems:  Patient Active Problem List   Diagnosis Date Noted   Drug reaction 02/06/2023   Auditory hallucination 02/06/2023   Medication reaction, initial encounter 02/06/2023   Common cold 12/04/2022   Morbid obesity (HCC) 12/04/2022   Sciatica of left side 11/11/2022   Non-recurrent acute suppurative otitis media of left ear without spontaneous rupture of tympanic membrane 11/04/2022   Acute cough 11/04/2022   B12 deficiency 05/27/2022   DVT  of deep femoral vein (HCC) 05/26/2020   Chronic pain disorder 05/16/2020   Chronic, continuous use of opioids 05/16/2020   Anxiety 06/17/2018   Lumbar postlaminectomy syndrome 10/23/2017   Lumbosacral radiculopathy 10/23/2017   Vitamin D deficiency 03/01/2015   Acquired spondylolisthesis 03/01/2015   Elevated LDL cholesterol level 03/01/2015   Chronic constipation 03/01/2015   Dysthymia 11/11/2013   Absolute anemia 10/15/2012   Gastric bypass status for obesity 10/15/2012   Insomnia 09/03/2007    Allergic rhinitis 06/15/2007   Iron deficiency anemia 01/22/2007   Restless leg 09/23/2006   Headache, migraine 08/27/2006   Acquired lymphedema 07/29/1998   Adult hypothyroidism 07/29/1992    Allergies:  Allergies  Allergen Reactions   Lyrica [Pregabalin] Other (See Comments)    Patient reports neurological problem. Speech disturbances and unable to control motor skills   Penicillins Rash    Per Husband, Patient can tolerate Cephalosporins    Trazodone And Nefazodone Other (See Comments)    Possible hallucinations, Mania    Gabapentin Swelling   Nsaids Other (See Comments)    Avoids due to gastric bypass Avoids due to gastric bypass   Duloxetine Diarrhea, Nausea And Vomiting, Other (See Comments) and Photosensitivity   Azithromycin Other (See Comments)    Very low absorption due to bariatric surgery; often resulting in therapeutic failure.  Use alternative abx, such as doxycycline or clarithromycin.   Medications:  Current Outpatient Medications:    albuterol (VENTOLIN HFA) 108 (90 Base) MCG/ACT inhaler, Inhale 2 puffs into the lungs every 6 (six) hours as needed for wheezing or shortness of breath., Disp: 6.7 g, Rfl: 1   ALPRAZolam (XANAX XR) 1 MG 24 hr tablet, Take 1 tablet (1 mg total) by mouth at bedtime., Disp: 30 tablet, Rfl: 2   ALPRAZolam (XANAX) 1 MG tablet, Take 1 tablet (1 mg total) by mouth at bedtime. May take an additional tablet as needed, Disp: 60 tablet, Rfl: 2   apixaban (ELIQUIS) 5 MG TABS tablet, Take 1 tablet (5 mg total) by mouth 2 (two) times daily., Disp: 60 tablet, Rfl: 11   Armodafinil 150 MG tablet, Take 1 tablet (150 mg total) by mouth daily., Disp: 30 tablet, Rfl: 5   Azelastine-Fluticasone 137-50 MCG/ACT SUSP, Place 1 spray into the nose every 12 (twelve) hours., Disp: 23 g, Rfl: 0   celecoxib (CELEBREX) 100 MG capsule, Take 1 capsule (100 mg total) by mouth 2 (two) times daily as needed., Disp: 30 capsule, Rfl: 2   cetirizine (ZYRTEC) 10 MG tablet,  Take 1 tablet (10 mg total) by mouth daily as needed for allergies., Disp: 90 tablet, Rfl: 2   cholecalciferol (VITAMIN D3) 25 MCG (1000 UNIT) tablet, Take 1,000 Units by mouth daily., Disp: , Rfl:    cyanocobalamin (VITAMIN B12) 1000 MCG/ML injection, Inject 1 mL (1,000 mcg total) into the skin every 14 (fourteen) days (2 weeks) for 3 months then 1ml once a month, Disp: 6 mL, Rfl: 4   HYDROcodone-acetaminophen (NORCO) 10-325 MG tablet, Take 1 tablet by mouth 4 (four) times daily as needed., Disp: 100 tablet, Rfl: 0   levothyroxine (SYNTHROID) 150 MCG tablet, Take 1 tablet (150 mcg total) by mouth daily. Please schedule an office visit before anymore refills., Disp: 90 tablet, Rfl: 1   methocarbamol (ROBAXIN) 500 MG tablet, Take 1 tablet (500 mg total) by mouth 2 (two) times daily., Disp: 60 tablet, Rfl: 3   montelukast (SINGULAIR) 10 MG tablet, Take 1 tablet (10 mg total) by mouth at bedtime., Disp: 90  tablet, Rfl: 0   Multiple Vitamin (MULTIVITAMIN) tablet, Take 1 tablet by mouth daily., Disp: , Rfl:    ondansetron (ZOFRAN) 4 MG tablet, Take 1 tablet (4 mg total) by mouth every 8 (eight) hours as needed for nausea or vomiting., Disp: 20 tablet, Rfl: 0   oseltamivir (TAMIFLU) 75 MG capsule, Take 1 capsule (75 mg total) by mouth daily for 10 days. If you develop symptoms of flu, take twice daily for remainder of prescription., Disp: 10 capsule, Rfl: 0   oxyCODONE-acetaminophen (PERCOCET/ROXICET) 5-325 MG tablet, Take 1 tablet by mouth daily as needed., Disp: 7 tablet, Rfl: 0   predniSONE (DELTASONE) 20 MG tablet, Take 2 tablets (40 mg total) by mouth daily with breakfast., Disp: 14 tablet, Rfl: 0   promethazine (PHENERGAN) 25 MG tablet, Take 1 tablet (25 mg total) by mouth every 8 (eight) hours as needed for nausea or vomiting., Disp: 30 tablet, Rfl: 2   promethazine-dextromethorphan (PROMETHAZINE-DM) 6.25-15 MG/5ML syrup, Take 5 mLs by mouth 4 (four) times daily as needed., Disp: 118 mL, Rfl: 0    Rimegepant Sulfate (NURTEC) 75 MG TBDP, Take 1 tablet (75 mg total) by mouth every other day., Disp: 20 tablet, Rfl: 3  Observations/Objective: Patient is well-developed, well-nourished in no acute distress.  Resting comfortably  at home.  Head is normocephalic, atraumatic.  No labored breathing.  Speech is clear and coherent with logical content.  Patient is alert and oriented at baseline.    Assessment and Plan:  1. Viral upper respiratory tract infection  Finish out tamiflu  Follow up with new/ worsening symptoms    Meds ordered this encounter  Medications   promethazine-dextromethorphan (PROMETHAZINE-DM) 6.25-15 MG/5ML syrup    Sig: Take 5 mLs by mouth 4 (four) times daily as needed for cough.    Dispense:  118 mL    Refill:  0   predniSONE (DELTASONE) 20 MG tablet    Sig: Take 1 tablet (20 mg total) by mouth 2 (two) times daily with a meal for 5 days.    Dispense:  10 tablet    Refill:  0          Follow Up Instructions: I discussed the assessment and treatment plan with the patient. The patient was provided an opportunity to ask questions and all were answered. The patient agreed with the plan and demonstrated an understanding of the instructions.  A copy of instructions were sent to the patient via MyChart unless otherwise noted below.    The patient was advised to call back or seek an in-person evaluation if the symptoms worsen or if the condition fails to improve as anticipated.    Michaela Simas, FNP

## 2023-06-19 NOTE — Telephone Encounter (Signed)
  Chief Complaint: Sore throat, cough, chest tightness - Requesting medications Symptoms: above Frequency: several days Pertinent Negatives: Patient denies  Disposition: [] ED /[] Urgent Care (no appt availability in office) / [] Appointment(In office/virtual)/ [x]  [place] [facility]/ [] Home Care/ [] Refused Recommended Disposition /[] [facility]/ []  Follow-up with PCP Additional Notes: Pt is interested in getting additional medications. Pt does not want to wait to get medications until tomorrow. Pt wants to have a VV this evening.   Summary: flu   Pt called saying she is not feeling any better since her visit Monday.  She wants to know if dr. Sherrie Mustache could give her something.  He told her to call back if she is no better.  She has flu like symptoms.  She is taking tamiflu.  She wants an antibiotic and cough medication.  ZO@  [phone]     Reason for Disposition  Prescription request for new medicine (not a refill)  Answer Assessment - Initial Assessment Questions 1. DRUG NAME: "What medicine do you need to have refilled?"     Pt is wanting cough medication and abx for S/S 2. REFILLS REMAINING: "How many refills are remaining?" (Note: The label on the medicine or pill bottle will show how many refills are remaining. If there are no refills remaining, then a renewal may be needed.)     none 5. SYMPTOMS: "Do you have any symptoms?"     Yes - cough, sore throat. Chest pressure  Protocols used: Medication Refill and Renewal Call-A-AH

## 2023-06-20 ENCOUNTER — Encounter: Payer: Self-pay | Admitting: Physician Assistant

## 2023-06-25 ENCOUNTER — Telehealth (INDEPENDENT_AMBULATORY_CARE_PROVIDER_SITE_OTHER): Payer: BC Managed Care – PPO | Admitting: Psychiatry

## 2023-06-25 ENCOUNTER — Other Ambulatory Visit: Payer: Self-pay

## 2023-06-25 ENCOUNTER — Encounter: Payer: Self-pay | Admitting: Psychiatry

## 2023-06-25 DIAGNOSIS — F411 Generalized anxiety disorder: Secondary | ICD-10-CM | POA: Diagnosis not present

## 2023-06-25 DIAGNOSIS — F5101 Primary insomnia: Secondary | ICD-10-CM

## 2023-06-25 DIAGNOSIS — F909 Attention-deficit hyperactivity disorder, unspecified type: Secondary | ICD-10-CM | POA: Diagnosis not present

## 2023-06-25 MED ORDER — ARMODAFINIL 150 MG PO TABS
150.0000 mg | ORAL_TABLET | Freq: Every day | ORAL | 5 refills | Status: DC
  Filled 2023-06-25: qty 30, 30d supply, fill #0
  Filled 2023-11-12: qty 30, 30d supply, fill #1
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
## 2023-06-27 ENCOUNTER — Other Ambulatory Visit: Payer: Self-pay

## 2023-06-29 ENCOUNTER — Other Ambulatory Visit: Payer: Self-pay | Admitting: Family Medicine

## 2023-06-29 ENCOUNTER — Other Ambulatory Visit: Payer: Self-pay

## 2023-06-29 DIAGNOSIS — M961 Postlaminectomy syndrome, not elsewhere classified: Secondary | ICD-10-CM

## 2023-06-29 DIAGNOSIS — F1190 Opioid use, unspecified, uncomplicated: Secondary | ICD-10-CM

## 2023-06-30 ENCOUNTER — Other Ambulatory Visit: Payer: Self-pay

## 2023-06-30 ENCOUNTER — Other Ambulatory Visit: Payer: Self-pay | Admitting: Nurse Practitioner

## 2023-06-30 ENCOUNTER — Telehealth: Payer: Self-pay | Admitting: Family Medicine

## 2023-06-30 DIAGNOSIS — J069 Acute upper respiratory infection, unspecified: Secondary | ICD-10-CM

## 2023-06-30 MED FILL — Hydrocodone-Acetaminophen Tab 10-325 MG: ORAL | 25 days supply | Qty: 100 | Fill #0 | Status: CN

## 2023-06-30 NOTE — Telephone Encounter (Signed)
Patient called stated she was given a note for 11/18, 11/19, 11/21 and 11/22 but her employer is wanting a note for 11/20 as she was too sick to work. Please f/u with patient when it is sent to her mychart.

## 2023-07-01 ENCOUNTER — Other Ambulatory Visit: Payer: Self-pay

## 2023-07-01 ENCOUNTER — Other Ambulatory Visit: Payer: Self-pay | Admitting: Family Medicine

## 2023-07-01 DIAGNOSIS — J069 Acute upper respiratory infection, unspecified: Secondary | ICD-10-CM

## 2023-07-01 NOTE — Telephone Encounter (Signed)
That's fine, she can have a note for 06-18-23

## 2023-07-02 NOTE — Telephone Encounter (Signed)
Letter sent via Mychart. Detailed VM left per DPR advising letter sent  CRM created. Ok for [facility] to advise if patient returns call.

## 2023-07-03 NOTE — Telephone Encounter (Signed)
Requested medications are due for refill today.  unsure  Requested medications are on the active medications list.  yes  Last refill. 06/19/2023 118 mL 0 rf  Future visit scheduled.   no  Notes to clinic.  Please review for refill.     Requested Prescriptions  Pending Prescriptions Disp Refills   promethazine-dextromethorphan (PROMETHAZINE-DM) 6.25-15 MG/5ML syrup 118 mL 0    Sig: Take 5 mLs by mouth 4 (four) times daily as needed for cough.     Ear, Nose, and Throat:  Antitussives/Expectorants Passed - 07/01/2023 11:03 AM      Passed - Valid encounter within last 12 months    Recent Outpatient Visits           2 weeks ago Flu-like symptoms   [place] Malva Limes, MD   3 months ago No-show for appointment   [facility] Malva Limes, MD   4 months ago Chronic left shoulder pain   [facility] Malva Limes, MD   5 months ago Left arm pain   [facility] Malva Limes, MD   6 months ago Acute non-recurrent frontal sinusitis   [facility] Malva Limes, MD

## 2023-07-04 ENCOUNTER — Other Ambulatory Visit: Payer: Self-pay

## 2023-07-04 ENCOUNTER — Encounter: Payer: Self-pay | Admitting: Oncology

## 2023-07-04 MED FILL — Hydrocodone-Acetaminophen Tab 10-325 MG: ORAL | 25 days supply | Qty: 100 | Fill #0 | Status: AC

## 2023-07-04 MED FILL — Promethazine-DM Syrup 6.25-15 MG/5ML: ORAL | 6 days supply | Qty: 118 | Fill #0 | Status: AC

## 2023-07-07 ENCOUNTER — Other Ambulatory Visit: Payer: Self-pay

## 2023-07-07 MED FILL — Alprazolam Tab ER 24HR 1 MG: ORAL | 30 days supply | Qty: 30 | Fill #1 | Status: CN

## 2023-07-08 ENCOUNTER — Other Ambulatory Visit: Payer: Self-pay | Admitting: Family Medicine

## 2023-07-08 ENCOUNTER — Other Ambulatory Visit: Payer: Self-pay

## 2023-07-08 ENCOUNTER — Encounter: Payer: Self-pay | Admitting: Oncology

## 2023-07-08 DIAGNOSIS — J301 Allergic rhinitis due to pollen: Secondary | ICD-10-CM

## 2023-07-08 MED ORDER — MONTELUKAST SODIUM 10 MG PO TABS
10.0000 mg | ORAL_TABLET | Freq: Every day | ORAL | 2 refills | Status: AC
  Filled 2023-07-08 – 2023-08-07 (×2): qty 90, 90d supply, fill #0
  Filled 2023-10-12 – 2023-11-12 (×2): qty 90, 90d supply, fill #1
  Filled 2024-04-24: qty 90, 90d supply, fill #2

## 2023-07-08 MED ORDER — AZELASTINE-FLUTICASONE 137-50 MCG/ACT NA SUSP
1.0000 | Freq: Two times a day (BID) | NASAL | 2 refills | Status: DC
  Filled 2023-07-08: qty 23, 30d supply, fill #0
  Filled 2023-10-12: qty 23, 30d supply, fill #1
  Filled 2024-04-24 – 2024-04-25 (×2): qty 23, 30d supply, fill #2

## 2023-07-08 MED FILL — Alprazolam Tab 1 MG: ORAL | 30 days supply | Qty: 60 | Fill #1 | Status: AC

## 2023-07-11 ENCOUNTER — Other Ambulatory Visit: Payer: Self-pay

## 2023-07-11 MED FILL — Alprazolam Tab ER 24HR 1 MG: ORAL | 30 days supply | Qty: 30 | Fill #1 | Status: AC

## 2023-07-14 ENCOUNTER — Other Ambulatory Visit: Payer: Self-pay

## 2023-07-14 ENCOUNTER — Telehealth: Payer: Self-pay | Admitting: Psychiatry

## 2023-07-14 DIAGNOSIS — F411 Generalized anxiety disorder: Secondary | ICD-10-CM

## 2023-07-14 NOTE — Telephone Encounter (Signed)
Patient reporting that things at work have gotten really bad and she feels like the principal is ganging up on her. She said she keeps getting emails asking for things that she is not able to provide. She is reporting her hands/feet are sweaty, feet are cold, not sleeping or eating well since Thursday and is having PA. She called out of work today due to [place]. She denied SI, but said she is overwhelmed. I can hear the anxiety during our conversation, sounds SOB, but is not tearful.  Said she was told to reach out if situation worsened.

## 2023-07-14 NOTE — Telephone Encounter (Signed)
Please call to see if you can get patient in with [facility]. If no availability please put on wait list.

## 2023-07-14 NOTE — Telephone Encounter (Signed)
Last visit was 06/25/23. She is calling about a new med that she was put on. She is having a conflict with it. Is it safe to drive? She has been unable to sleep or eat. She says that she cannot return to work until she feels better. Her phone  number is [phone].

## 2023-07-14 NOTE — Telephone Encounter (Signed)
Pt LVM @  1:07p to see if we got the first message.  She said she's not doing well.  The correct phone number is [phone].  Pls call her back to discuss situation.

## 2023-07-14 NOTE — Telephone Encounter (Signed)
Can provide work letter for today. Does she feel she is able to return to work tomorrow? Recommend scheduling earlier apt and getting on wait list to be seen sooner.

## 2023-07-15 NOTE — Telephone Encounter (Signed)
Pt called back at 10:54a.  She is asking if Shanda Bumps would write her a note for yesterday (12/16), today(12/17) and tomorrow (12/18).  She said she thinks she can go in on Thurs (12/19) and Fri (12/20).  She took a work in appt on 12/27.  I have also put her on the wait list.

## 2023-07-15 NOTE — Telephone Encounter (Signed)
Please let patient know work note was sent via My Chart. Please print the following on letterhead so that she can have a signed work note.     To Whom It May Concern :  Please excuse Michaela Jones from any missed work 07/14/23-07/16/23. She is able to return to work without restrictions on 07/17/23. Please contact our office with any questions or if additional information is needed.    Sincerely,     Corie Chiquito , PMH-NP

## 2023-07-15 NOTE — Telephone Encounter (Signed)
Asking for work note for yesterday (12/16), today(12/17) and tomorrow (12/18).  She said she thinks she can go in on Thurs (12/19) and Fri (12/20).   She took a work in appt on 12/27.  I have also put her on the wait list.

## 2023-07-15 NOTE — Telephone Encounter (Signed)
Printed note on letterhead and put in your box in admin.

## 2023-07-16 ENCOUNTER — Ambulatory Visit: Payer: BC Managed Care – PPO | Admitting: Family Medicine

## 2023-07-16 NOTE — Telephone Encounter (Signed)
Pt LVM @ 1:26p asking for the note to be extended to the end of this week.  She also wants to know if there is another medicine she can take between now and when she has her appt on 12/27 (in addition to what she is taking).

## 2023-07-16 NOTE — Telephone Encounter (Signed)
Signed and placed in office inbox.

## 2023-07-17 ENCOUNTER — Other Ambulatory Visit: Payer: Self-pay

## 2023-07-17 MED ORDER — BUSPIRONE HCL 15 MG PO TABS
ORAL_TABLET | ORAL | 0 refills | Status: DC
  Filled 2023-07-17: qty 60, 30d supply, fill #0

## 2023-07-17 NOTE — Telephone Encounter (Signed)
Patient notified of Rx and recommendation to retry Buspar. She would like to pick up the letter.

## 2023-07-17 NOTE — Telephone Encounter (Signed)
Patient requesting to amend letter to take her out for the rest of this week. I have amended letter and will reprint on letterhead and put in your box after lunch.   She has FU 12/27, is asking if you can prescribe something for her in the meantime.

## 2023-07-17 NOTE — Telephone Encounter (Signed)
Recommend re-trying Buspar for anxiety. Will sign letter once ready.

## 2023-07-18 ENCOUNTER — Ambulatory Visit: Payer: BC Managed Care – PPO | Admitting: Psychiatry

## 2023-07-18 ENCOUNTER — Encounter: Payer: Self-pay | Admitting: Psychiatry

## 2023-07-18 ENCOUNTER — Other Ambulatory Visit: Payer: Self-pay

## 2023-07-18 DIAGNOSIS — F5101 Primary insomnia: Secondary | ICD-10-CM

## 2023-07-18 DIAGNOSIS — F411 Generalized anxiety disorder: Secondary | ICD-10-CM

## 2023-07-18 MED ORDER — ALPRAZOLAM 1 MG PO TABS
2.0000 mg | ORAL_TABLET | Freq: Every day | ORAL | 2 refills | Status: DC
  Filled 2023-07-18 – 2023-08-01 (×2): qty 90, 45d supply, fill #0
  Filled 2023-08-04: qty 90, 30d supply, fill #0
  Filled 2023-08-04: qty 90, 45d supply, fill #0
  Filled ????-??-??: fill #1

## 2023-07-18 NOTE — Progress Notes (Signed)
DOREENA LORY [id number] 05/30/1976 47 y.o.  Subjective:   Patient ID:  Michaela Jones is a 47 y.o. (DOB May 21, 1976) female.  Chief Complaint:  Chief Complaint  Patient presents with   Panic Attack   Anxiety   Insomnia    HPI Licet Ebnet Pless presents to the office today emergently for increased anxiety and insomnia. She is accompanied by husband. She reports that she had a meeting last Thursday at work "that did not go well." She reports that she had a panic attack on Thursday. She received work-related emails early Monday morning that caused increased anxiety and panic with sweating and shaking.  She reports that she has had severe anxiety in response to not having access to a system that she needs to complete her work responsibilities and she has taken several days off to finish her work. .  She reports that she was not able to eat for most of last week and just started eating small amounts of food again after family encouraged her. She reports losing 9 lbs in the last week. She reports that she has been taking Xanax XR 1 mg at bedtime and then has taken Xanax IR 2 mg at bedtime. She reports that she has needed to take an additional Xanax at times during the day due to panic and high anxiety. She reports that this is the worst anxiety she has experienced. She reports multiple panic attacks daily. She has had multiple episodes of diarrhea since acute stressors. She reports some hypervigilance and exaggerated startle response. She reports that she has been having nightmares about work. She reports rumination and intrusive thoughts. Denies SI.   Husband reports that she is staying up late at night to complete work responsibilities. He reports that she is occasionally oversleeping after staying up late working. He reports that she is working on her days off. Husband reports that she was not able to sleep last weekend after the meeting on Thursday. Husband reports that she "crashed" and fell asleep  immediately. Husband reports that she was awake until 2:30 am-3 am.    Alprazolam last filled 07/14/23 Alprazolam XR last filled 07/14/23.   Past Psychiatric Medication Trials: Cymbalta Sertraline Remeron Wellbutrin Buspar Nuvigil Provigil- increased activation Ritalin Concerta Adderall- Not as effective Strattera Klonopin Xanax Vistaril- Ineffective Ambien- Effective. Caused parasomnias. Lunesta Temazepam Trazodone-Hallucinations, delusions Belsomra Dayvigo Doxepin Silenor Seroquel- wt gain Gabapentin- Stuttering Lyrica- "Almost catatonic" Trileptal- tingling in extremities  PHQ2-9    Flowsheet Row Office Visit from 02/04/2023 in [facility] Office Visit from 01/06/2023 in [facility] Office Visit from 11/11/2022 in [facility] Office Visit from 11/04/2022 in [facility] Office Visit from 06/24/2022 in [facility]  PHQ-2 Total Score 2 2 1 3  0  PHQ-9 Total Score 15 11 6 10 4       Flowsheet Row ED from 01/28/2023 in [facility] Emergency Department at [facility]  C-SSRS RISK CATEGORY No Risk        Review of Systems:  Review of Systems  Gastrointestinal:  Positive for diarrhea.  Musculoskeletal:  Negative for gait problem.       Leg pain after falling when foot got tangled in sheets.  Neurological:  Positive for tremors.  Psychiatric/Behavioral:         Please refer to HPI    Medications: I have reviewed the patient's current medications.  Current Outpatient Medications  Medication Sig Dispense  Refill   busPIRone (BUSPAR) 15 MG tablet Take 1/3 tablet p.o. twice daily for 1 week, then take 2/3 tablet p.o. twice daily for 1 week, then take 1 tablet p.o. twice daily 60 tablet 0   albuterol (VENTOLIN HFA) 108 (90 Base) MCG/ACT inhaler Inhale 2 puffs into the lungs every 6 (six) hours as needed for wheezing or shortness of  breath. 6.7 g 1   ALPRAZolam (XANAX XR) 1 MG 24 hr tablet Take 1 tablet (1 mg total) by mouth at bedtime. 30 tablet 2   [START ON 08/02/2023] ALPRAZolam (XANAX) 1 MG tablet Take 2 tablets (2 mg total) by mouth at bedtime. May take an additional tablet as needed 90 tablet 2   apixaban (ELIQUIS) 5 MG TABS tablet Take 1 tablet (5 mg total) by mouth 2 (two) times daily. 60 tablet 11   [START ON 07/23/2023] Armodafinil 150 MG tablet Take 1 tablet (150 mg total) by mouth daily. 30 tablet 5   Azelastine-Fluticasone 137-50 MCG/ACT SUSP Place 1 spray into the nose every 12 (twelve) hours. 23 g 2   celecoxib (CELEBREX) 100 MG capsule Take 1 capsule (100 mg total) by mouth 2 (two) times daily as needed. 30 capsule 2   cetirizine (ZYRTEC) 10 MG tablet Take 1 tablet (10 mg total) by mouth daily as needed for allergies. 90 tablet 2   cholecalciferol (VITAMIN D3) 25 MCG (1000 UNIT) tablet Take 1,000 Units by mouth daily.     cyanocobalamin (VITAMIN B12) 1000 MCG/ML injection Inject 1 mL (1,000 mcg total) into the skin every 14 (fourteen) days (2 weeks) for 3 months then 1ml once a month 6 mL 4   HYDROcodone-acetaminophen (NORCO) 10-325 MG tablet Take 1 tablet by mouth 4 (four) times daily as needed. 100 tablet 0   levothyroxine (SYNTHROID) 150 MCG tablet Take 1 tablet (150 mcg total) by mouth daily. Please schedule an office visit before anymore refills. 90 tablet 1   montelukast (SINGULAIR) 10 MG tablet Take 1 tablet (10 mg total) by mouth at bedtime. 90 tablet 2   Multiple Vitamin (MULTIVITAMIN) tablet Take 1 tablet by mouth daily.     oxyCODONE-acetaminophen (PERCOCET/ROXICET) 5-325 MG tablet Take 1 tablet by mouth daily as needed. 7 tablet 0   promethazine (PHENERGAN) 25 MG tablet Take 1 tablet (25 mg total) by mouth every 8 (eight) hours as needed for nausea or vomiting. 30 tablet 2   promethazine-dextromethorphan (PROMETHAZINE-DM) 6.25-15 MG/5ML syrup Take 5 mLs by mouth 4 (four) times daily as needed for  cough. 118 mL 0   Rimegepant Sulfate (NURTEC) 75 MG TBDP Take 1 tablet (75 mg total) by mouth every other day. 20 tablet 3   No current facility-administered medications for this visit.    Medication Side Effects: None  Allergies:  Allergies  Allergen Reactions   Lyrica [Pregabalin] Other (See Comments)    Patient reports neurological problem. Speech disturbances and unable to control motor skills   Penicillins Rash    Per Husband, Patient can tolerate Cephalosporins    Trazodone And Nefazodone Other (See Comments)    Possible hallucinations, Mania    Gabapentin Swelling   Nsaids Other (See Comments)    Avoids due to gastric bypass Avoids due to gastric bypass   Duloxetine Diarrhea, Nausea And Vomiting, Other (See Comments) and Photosensitivity   Azithromycin Other (See Comments)    Very low absorption due to bariatric surgery; often resulting in therapeutic failure.  Use alternative abx, such as doxycycline or clarithromycin.  Past Medical History:  Diagnosis Date   Anemia    Chronic back pain    Complication of anesthesia    HARD TO WAKE UP/OXYGEN LEVEL DROPPED AFTER BACK SURGERY X1   Constipation, chronic    Depression    DVT (deep venous thrombosis) (HCC)    Edema    Factor 5 Leiden mutation, heterozygous (HCC)    History of seasonal allergies    pt has inhalers for this if needed   Hypotension    Hypothyroid    Insomnia    Lymphedema of both lower extremities    Migraine    Restless leg syndrome    Vitamin D deficiency     Past Medical History, Surgical history, Social history, and Family history were reviewed and updated as appropriate.   Please see review of systems for further details on the patient's review from today.   Objective:   Physical Exam:  There were no vitals taken for this visit.  Physical Exam Constitutional:      General: She is not in acute distress. Musculoskeletal:        General: No deformity.  Neurological:     Mental  Status: She is alert and oriented to person, place, and time.     Coordination: Coordination normal.  Psychiatric:        Attention and Perception: Attention and perception normal. She does not perceive auditory or visual hallucinations.        Mood and Affect: Mood is anxious. Mood is not depressed. Affect is not labile, blunt, angry or inappropriate.        Speech: Speech normal.        Behavior: Behavior normal.        Thought Content: Thought content normal. Thought content is not paranoid or delusional. Thought content does not include homicidal or suicidal ideation. Thought content does not include homicidal or suicidal plan.        Cognition and Memory: Cognition and memory normal.        Judgment: Judgment normal.     Comments: Insight intact     Lab Review:     Component Value Date/Time   NA 138 01/28/2023 1450   NA 144 05/15/2022 1031   NA 139 12/03/2012 1136   K 3.6 01/28/2023 1450   K 4.0 12/03/2012 1136   CL 105 01/28/2023 1450   CL 105 12/03/2012 1136   CO2 24 01/28/2023 1450   CO2 26 12/03/2012 1136   GLUCOSE 102 (H) 01/28/2023 1450   GLUCOSE 77 12/03/2012 1136   BUN 7 01/28/2023 1450   BUN 10 05/15/2022 1031   BUN 16 12/03/2012 1136   CREATININE 0.54 01/28/2023 1450   CREATININE 0.46 (L) 12/03/2012 1136   CALCIUM 8.8 (L) 01/28/2023 1450   CALCIUM 9.0 12/03/2012 1136   PROT 6.7 01/28/2023 1450   PROT 6.2 05/15/2022 1031   ALBUMIN 3.9 01/28/2023 1450   ALBUMIN 3.9 05/15/2022 1031   AST 20 01/28/2023 1450   ALT 16 01/28/2023 1450   ALKPHOS 52 01/28/2023 1450   BILITOT 0.8 01/28/2023 1450   BILITOT 0.2 05/15/2022 1031   GFRNONAA >60 01/28/2023 1450   GFRNONAA >60 12/03/2012 1136   GFRAA 102 04/07/2020 1130   GFRAA >60 12/03/2012 1136       Component Value Date/Time   WBC 10.6 (H) 01/28/2023 1450   RBC 4.01 01/28/2023 1450   HGB 13.3 01/28/2023 1450   HGB 13.1 05/15/2022 1031   HCT 41.0 01/28/2023 1450  HCT 39.2 05/15/2022 1031   PLT 288  01/28/2023 1450   PLT 222 05/15/2022 1031   MCV 102.2 (H) 01/28/2023 1450   MCV 100 (H) 05/15/2022 1031   MCV 101 (H) 06/15/2013 1042   MCH 33.2 01/28/2023 1450   MCHC 32.4 01/28/2023 1450   RDW 13.2 01/28/2023 1450   RDW 12.3 05/15/2022 1031   RDW 14.5 06/15/2013 1042   LYMPHSABS 2.7 01/28/2023 1450   LYMPHSABS 2.5 04/07/2020 1130   LYMPHSABS 3.5 06/15/2013 1042   MONOABS 1.0 01/28/2023 1450   MONOABS 0.9 06/15/2013 1042   EOSABS 0.1 01/28/2023 1450   EOSABS 0.2 04/07/2020 1130   EOSABS 0.1 06/15/2013 1042   BASOSABS 0.0 01/28/2023 1450   BASOSABS 0.1 04/07/2020 1130   BASOSABS 0.1 06/15/2013 1042    No results found for: "POCLITH", "LITHIUM"   No results found for: "PHENYTOIN", "PHENOBARB", "VALPROATE", "CBMZ"   .res Assessment: Plan:    35 minutes spent dedicated to the care of this patient on the date of this encounter to include pre-visit review of records, ordering of medication, post visit documentation, and face-to-face time with the patient discussing acute anxiety and panic in response to work-related stress. Discussed continuing recent re-start of Buspar to possibly improve anxiety.  Will temporarily increase Xanax IR to 2 mg at bedtime for sleep initiation and 1 mg daily as needed for anxiety.  Will continue Alprazolam XR 1 mg for sleep maintenance. Continue Armodafinil 150 mg daily for off label indication for ADHD. Recommend that pt continue to be on leave of absence through today, 07/18/23 due to panic, anxiety, and insomnia.  Pt to follow-up with this provider in 4-6 weeks or sooner if clinically indicated.  Patient advised to contact office with any questions, adverse effects, or acute worsening in signs and symptoms.  Jashae "[name]" was seen today for panic attack, anxiety and insomnia.  Diagnoses and all orders for this visit:  Generalized anxiety disorder -     ALPRAZolam (XANAX) 1 MG tablet; Take 2 tablets (2 mg total) by mouth at bedtime. May take an  additional tablet as needed  Primary insomnia -     ALPRAZolam (XANAX) 1 MG tablet; Take 2 tablets (2 mg total) by mouth at bedtime. May take an additional tablet as needed     Please see After Visit Summary for patient specific instructions.  No future appointments.   No orders of the defined types were placed in this encounter.   -------------------------------

## 2023-07-19 ENCOUNTER — Other Ambulatory Visit: Payer: Self-pay | Admitting: Family Medicine

## 2023-07-20 ENCOUNTER — Other Ambulatory Visit: Payer: Self-pay

## 2023-07-20 ENCOUNTER — Encounter: Payer: Self-pay | Admitting: Oncology

## 2023-07-20 MED ORDER — OXYCODONE-ACETAMINOPHEN 5-325 MG PO TABS
1.0000 | ORAL_TABLET | Freq: Every day | ORAL | 0 refills | Status: DC | PRN
  Filled 2023-07-20: qty 7, 7d supply, fill #0

## 2023-07-21 ENCOUNTER — Ambulatory Visit: Payer: BC Managed Care – PPO | Admitting: Family Medicine

## 2023-07-21 ENCOUNTER — Other Ambulatory Visit: Payer: Self-pay

## 2023-07-21 ENCOUNTER — Encounter: Payer: Self-pay | Admitting: Family Medicine

## 2023-07-21 VITALS — BP 119/74 | HR 78 | Resp 16 | Wt 247.0 lb

## 2023-07-21 DIAGNOSIS — G894 Chronic pain syndrome: Secondary | ICD-10-CM

## 2023-07-21 DIAGNOSIS — M961 Postlaminectomy syndrome, not elsewhere classified: Secondary | ICD-10-CM

## 2023-07-21 DIAGNOSIS — M5417 Radiculopathy, lumbosacral region: Secondary | ICD-10-CM

## 2023-07-21 DIAGNOSIS — F1190 Opioid use, unspecified, uncomplicated: Secondary | ICD-10-CM | POA: Diagnosis not present

## 2023-07-21 DIAGNOSIS — F419 Anxiety disorder, unspecified: Secondary | ICD-10-CM

## 2023-07-21 DIAGNOSIS — M4310 Spondylolisthesis, site unspecified: Secondary | ICD-10-CM

## 2023-07-21 MED ORDER — CELECOXIB 100 MG PO CAPS
100.0000 mg | ORAL_CAPSULE | Freq: Two times a day (BID) | ORAL | 2 refills | Status: DC | PRN
  Filled 2023-07-21: qty 30, 15d supply, fill #0
  Filled 2023-09-22: qty 30, 15d supply, fill #1
  Filled 2023-10-12: qty 30, 15d supply, fill #2

## 2023-07-21 MED ORDER — HYDROCODONE-ACETAMINOPHEN 7.5-325 MG PO TABS
1.0000 | ORAL_TABLET | Freq: Four times a day (QID) | ORAL | 0 refills | Status: DC | PRN
  Filled 2023-07-21 – 2023-07-24 (×3): qty 120, 15d supply, fill #0
  Filled ????-??-??: fill #0

## 2023-07-22 ENCOUNTER — Other Ambulatory Visit: Payer: Self-pay

## 2023-07-22 ENCOUNTER — Encounter: Payer: Self-pay | Admitting: Oncology

## 2023-07-22 NOTE — Progress Notes (Signed)
Established patient visit   Patient: Michaela Jones   DOB: [birth year]   47 y.o. Female  MRN: [id number] Visit Date: 07/21/2023  Today's healthcare provider: [name], MD   Chief Complaint  Patient presents with   Anxiety    Sees psychiatrist-[name] Montez Morita   Leg Injury    tripped getting out of bed and hurt left leg and just now able to put weight on it.   Subjective    Discussed the use of AI scribe software for clinical note transcription with the patient, who gave verbal consent to proceed.  History of Present Illness   The patient, with a history of lymphedema and back pain, presents with increased pain due to a recent change in her job responsibilities. The patient's job now requires her to climb over eight flights of stairs and walk more than four miles daily, which has exacerbated her existing conditions. The patient also reports a recent fall at home, which resulted in a leg injury. She believes she pulled a muscle in her groin or buttock area, as she is experiencing pain in these regions and is having to rely on her right leg more.  The patient has been managing her pain with hydrocodone (10/325) for several years, but had previously been prescribed significantly higher dose of opiods from pain clinic. Due to  the increased physical demands of her job and the recent fall, she is requesting a temporary increase in her medication. She has also been taking Celebrex for inflammation, occasionally taking an extra dose for severe headaches or increased inflammation.  In addition to her physical health concerns, the patient is also dealing with a high-stress work environment. She reports feeling attacked by her superiors and is struggling with the lack of support and resources needed to perform her job effectively. This has led to increased anxiety, for which she has been prescribed Buspar and alprazolam. The patient reports having panic attacks, with symptoms including sweaty  palms and shaking. She has been taking an extra dose of the short-acting alprazolam to manage these symptoms.  The patient is keen to manage her pain and stress effectively so she can continue to perform her job. She is not seeking long-term increases in her medication but is requesting temporary adjustments to help her cope with her current circumstances.       Medications: Outpatient Medications Prior to Visit  Medication Sig   albuterol (VENTOLIN HFA) 108 (90 Base) MCG/ACT inhaler Inhale 2 puffs into the lungs every 6 (six) hours as needed for wheezing or shortness of breath.   ALPRAZolam (XANAX XR) 1 MG 24 hr tablet Take 1 tablet (1 mg total) by mouth at bedtime.   [START ON 08/02/2023] ALPRAZolam (XANAX) 1 MG tablet Take 2 tablets (2 mg total) by mouth at bedtime. May take an additional tablet as needed   apixaban (ELIQUIS) 5 MG TABS tablet Take 1 tablet (5 mg total) by mouth 2 (two) times daily.   [START ON 07/23/2023] Armodafinil 150 MG tablet Take 1 tablet (150 mg total) by mouth daily.   Azelastine-Fluticasone 137-50 MCG/ACT SUSP Place 1 spray into the nose every 12 (twelve) hours.   busPIRone (BUSPAR) 15 MG tablet Take 1/3 tablet p.o. twice daily for 1 week, then take 2/3 tablet p.o. twice daily for 1 week, then take 1 tablet p.o. twice daily   cetirizine (ZYRTEC) 10 MG tablet Take 1 tablet (10 mg total) by mouth daily as needed for allergies.   cholecalciferol (  VITAMIN D3) 25 MCG (1000 UNIT) tablet Take 1,000 Units by mouth daily.   cyanocobalamin (VITAMIN B12) 1000 MCG/ML injection Inject 1 mL (1,000 mcg total) into the skin every 14 (fourteen) days (2 weeks) for 3 months then 1ml once a month   HYDROcodone-acetaminophen (NORCO) 10-325 MG tablet Take 1 tablet by mouth 4 (four) times daily as needed.   levothyroxine (SYNTHROID) 150 MCG tablet Take 1 tablet (150 mcg total) by mouth daily. Please schedule an office visit before anymore refills.   montelukast (SINGULAIR) 10 MG tablet Take  1 tablet (10 mg total) by mouth at bedtime.   Multiple Vitamin (MULTIVITAMIN) tablet Take 1 tablet by mouth daily.   oxyCODONE-acetaminophen (PERCOCET/ROXICET) 5-325 MG tablet Take 1 tablet by mouth daily as needed.   promethazine (PHENERGAN) 25 MG tablet Take 1 tablet (25 mg total) by mouth every 8 (eight) hours as needed for nausea or vomiting.   promethazine-dextromethorphan (PROMETHAZINE-DM) 6.25-15 MG/5ML syrup Take 5 mLs by mouth 4 (four) times daily as needed for cough.   Rimegepant Sulfate (NURTEC) 75 MG TBDP Take 1 tablet (75 mg total) by mouth every other day.   [DISCONTINUED] celecoxib (CELEBREX) 100 MG capsule Take 1 capsule (100 mg total) by mouth 2 (two) times daily as needed.   No facility-administered medications prior to visit.       Objective    BP 119/74 (BP Location: Left Arm, Patient Position: Sitting, Cuff Size: Large)   Pulse 78   Resp 16   Wt 247 lb (112 kg)   BMI 41.10 kg/m   Physical Exam   General: Appearance:    Obese female in no acute distress  Eyes:    PERRL, conjunctiva/corneas clear, EOM's intact       Lungs:     Clear to auscultation bilaterally, respirations unlabored  Heart:    Normal heart rate. Normal rhythm. No murmurs, rubs, or gallops.    MS:   All extremities are intact.    Neurologic:   Awake, alert, oriented x 3. No apparent focal neurological defect.         Assessment & Plan        Chronic Pain (Lymphedema and post laminectomy syndrome) Increased physical activity due to job requirements exacerbating chronic pain. Current regimen of Hydrocodone 10/325mg  not providing adequate relief. -Increase Hydrocodone to 7.5/325mg , 1-2 tablets as needed for pain. -Plan to revert back to 10/325mg  with next refill.  Anxiety Increased work stress leading to panic attacks and insomnia. Currently on Alprazolam extended release and Buspar. -Continue current regimen. -Ensure adequate supply of Alprazolam for current usage.  Musculoskeletal  Injury Recent fall resulting in groin/buttock pain, likely due to muscle strain. Using Celebrex for inflammation. -Continue Celebrex as needed for inflammation and pain. -Refill Celebrex prescription.    No follow-ups on file.      [name], MD  [facility] [phone] (phone) [phone] (fax)  [facility]

## 2023-07-24 ENCOUNTER — Other Ambulatory Visit: Payer: Self-pay

## 2023-07-24 ENCOUNTER — Encounter: Payer: Self-pay | Admitting: Oncology

## 2023-07-25 ENCOUNTER — Other Ambulatory Visit: Payer: Self-pay

## 2023-07-25 ENCOUNTER — Telehealth: Payer: BC Managed Care – PPO | Admitting: Psychiatry

## 2023-07-28 ENCOUNTER — Encounter: Payer: Self-pay | Admitting: Oncology

## 2023-07-28 ENCOUNTER — Other Ambulatory Visit: Payer: Self-pay

## 2023-07-28 MED FILL — Alprazolam Tab ER 24HR 1 MG: ORAL | 30 days supply | Qty: 30 | Fill #2 | Status: CN

## 2023-07-29 ENCOUNTER — Other Ambulatory Visit: Payer: Self-pay

## 2023-07-29 ENCOUNTER — Telehealth: Payer: BC Managed Care – PPO | Admitting: Family Medicine

## 2023-07-29 DIAGNOSIS — M5417 Radiculopathy, lumbosacral region: Secondary | ICD-10-CM

## 2023-07-29 DIAGNOSIS — F1190 Opioid use, unspecified, uncomplicated: Secondary | ICD-10-CM

## 2023-07-29 DIAGNOSIS — J069 Acute upper respiratory infection, unspecified: Secondary | ICD-10-CM

## 2023-07-29 DIAGNOSIS — M549 Dorsalgia, unspecified: Secondary | ICD-10-CM

## 2023-07-29 MED ORDER — METHOCARBAMOL 500 MG PO TABS
500.0000 mg | ORAL_TABLET | Freq: Four times a day (QID) | ORAL | 1 refills | Status: DC | PRN
  Filled 2023-07-29: qty 60, 8d supply, fill #0
  Filled 2023-08-10: qty 60, 8d supply, fill #1

## 2023-07-29 MED FILL — Alprazolam Tab ER 24HR 1 MG: ORAL | 30 days supply | Qty: 30 | Fill #2 | Status: CN

## 2023-07-29 NOTE — Progress Notes (Signed)
 MyChart Video Visit    Virtual Visit via Video Note   This format is felt to be most appropriate for this patient at this time. Physical exam was limited by quality of the video and audio technology used for the visit.   Patient location: home Provider location: [facility]  I discussed the limitations of evaluation and management by telemedicine and the availability of in person appointments. The patient expressed understanding and agreed to proceed.  Patient: Michaela Jones   DOB: 1975/11/15   47 y.o. Female  MRN: [id number] Visit Date: 07/29/2023  Today's healthcare provider: Nancyann Perry, MD   No chief complaint on file.  Subjective    Discussed the use of AI scribe software for clinical note transcription with the patient, who gave verbal consent to proceed.  History of Present Illness   The patient presents with two primary concerns: a recent development of chest and throat congestion, and persistent back pain following a fall.  The patient describes the chest congestion as gunk that they are having difficulty clearing, despite using Mucinex , Singulair , and an inhaler. They believe the issue is more related to their throat and sinuses rather than a chest cold. The patient reports nasal drainage and a sensation of fullness in the ears, but denies any fever, with the highest recorded temperature being 99.1 degrees Fahrenheit.  Regarding the back pain, the patient reports that it has remained super, super tight since their last visit following a fall. They have been managing the pain with a hot compress and pain medication, but express a desire to avoid continued use of pain medication if the issue is muscular in nature. They have previously taken Flexeril  for muscle relaxation.  The patient's current medication regimen includes cetirizine  and Singular for their respiratory symptoms, and a prescribed azelastine -fluticasone  nasal spray (A G E L A S T I N E). They also report using  Afrin, but not a saline spray.  The patient's consultation was complicated by a recent family bereavement, which has led to an increase in text messages and potential distractions during the consultation.       Medications: Outpatient Medications Prior to Visit  Medication Sig   albuterol  (VENTOLIN  HFA) 108 (90 Base) MCG/ACT inhaler Inhale 2 puffs into the lungs every 6 (six) hours as needed for wheezing or shortness of breath.   ALPRAZolam  (XANAX  XR) 1 MG 24 hr tablet Take 1 tablet (1 mg total) by mouth at bedtime.   [START ON 08/02/2023] ALPRAZolam  (XANAX ) 1 MG tablet Take 2 tablets (2 mg total) by mouth at bedtime. May take an additional tablet as needed   apixaban  (ELIQUIS ) 5 MG TABS tablet Take 1 tablet (5 mg total) by mouth 2 (two) times daily.   Armodafinil  150 MG tablet Take 1 tablet (150 mg total) by mouth daily.   Azelastine -Fluticasone  137-50 MCG/ACT SUSP Place 1 spray into the nose every 12 (twelve) hours.   busPIRone  (BUSPAR ) 15 MG tablet Take 1/3 tablet p.o. twice daily for 1 week, then take 2/3 tablet p.o. twice daily for 1 week, then take 1 tablet p.o. twice daily   celecoxib  (CELEBREX ) 100 MG capsule Take 1 capsule (100 mg total) by mouth 2 (two) times daily as needed.   cetirizine  (ZYRTEC ) 10 MG tablet Take 1 tablet (10 mg total) by mouth daily as needed for allergies.   cholecalciferol  (VITAMIN D3) 25 MCG (1000 UNIT) tablet Take 1,000 Units by mouth daily.   cyanocobalamin  (VITAMIN B12) 1000 MCG/ML injection Inject  1 mL (1,000 mcg total) into the skin every 14 (fourteen) days (2 weeks) for 3 months then 1ml once a month   HYDROcodone -acetaminophen  (NORCO) 10-325 MG tablet Take 1 tablet by mouth 4 (four) times daily as needed.   HYDROcodone -acetaminophen  (NORCO) 7.5-325 MG tablet Take 1-2 tablets by mouth every 6 (six) hours as needed for moderate pain (pain score 4-6).   levothyroxine  (SYNTHROID ) 150 MCG tablet Take 1 tablet (150 mcg total) by mouth daily. Please schedule an  office visit before anymore refills.   montelukast  (SINGULAIR ) 10 MG tablet Take 1 tablet (10 mg total) by mouth at bedtime.   Multiple Vitamin (MULTIVITAMIN) tablet Take 1 tablet by mouth daily.   oxyCODONE -acetaminophen  (PERCOCET/ROXICET) 5-325 MG tablet Take 1 tablet by mouth daily as needed.   promethazine  (PHENERGAN ) 25 MG tablet Take 1 tablet (25 mg total) by mouth every 8 (eight) hours as needed for nausea or vomiting.   promethazine -dextromethorphan  (PROMETHAZINE -DM) 6.25-15 MG/5ML syrup Take 5 mLs by mouth 4 (four) times daily as needed for cough.   Rimegepant Sulfate  (NURTEC) 75 MG TBDP Take 1 tablet (75 mg total) by mouth every other day.   No facility-administered medications prior to visit.       Objective    There were no vitals taken for this visit.    Physical Exam  Awake, alert, oriented x 3. In no apparent distress      Assessment & Plan        Back Pain Chronic back pain exacerbated by recent fall. Patient has been using hot compresses and pain medication with limited relief. Discussed the use of muscle relaxants. -Prescribe Robaxin  (methocarbamol ) with instructions to take one during the day and two at night if needed.  Upper Respiratory Infection Recent onset of chest congestion, sinus pressure, and nasal drainage. No fever. Patient has been using Mucinex , Singulair , cetirizine , and Azelastine  nasal spray. -Continue current medications and supportive care. -Advise patient to contact office if symptoms worsen or do not improve, as this may indicate a transition from a viral to bacterial infection.         I discussed the assessment and treatment plan with the patient. The patient was provided an opportunity to ask questions and all were answered. The patient agreed with the plan and demonstrated an understanding of the instructions.   The patient was advised to call back or seek an in-person evaluation if the symptoms worsen or if the condition fails to  improve as anticipated.  I provided 11 minutes of non-face-to-face time during this encounter.    Nancyann Perry, MD [facility] [phone] (phone) [phone] (fax)  [facility]

## 2023-07-30 ENCOUNTER — Other Ambulatory Visit: Payer: Self-pay | Admitting: Family Medicine

## 2023-07-30 DIAGNOSIS — M961 Postlaminectomy syndrome, not elsewhere classified: Secondary | ICD-10-CM

## 2023-07-30 DIAGNOSIS — J069 Acute upper respiratory infection, unspecified: Secondary | ICD-10-CM

## 2023-07-30 DIAGNOSIS — F1190 Opioid use, unspecified, uncomplicated: Secondary | ICD-10-CM

## 2023-07-31 ENCOUNTER — Other Ambulatory Visit: Payer: Self-pay

## 2023-07-31 MED FILL — Alprazolam Tab ER 24HR 1 MG: ORAL | 30 days supply | Qty: 30 | Fill #2 | Status: CN

## 2023-08-01 ENCOUNTER — Other Ambulatory Visit: Payer: Self-pay

## 2023-08-03 ENCOUNTER — Other Ambulatory Visit: Payer: Self-pay | Admitting: Family Medicine

## 2023-08-03 ENCOUNTER — Other Ambulatory Visit: Payer: Self-pay

## 2023-08-03 DIAGNOSIS — F1190 Opioid use, unspecified, uncomplicated: Secondary | ICD-10-CM

## 2023-08-03 DIAGNOSIS — M961 Postlaminectomy syndrome, not elsewhere classified: Secondary | ICD-10-CM

## 2023-08-03 DIAGNOSIS — J069 Acute upper respiratory infection, unspecified: Secondary | ICD-10-CM

## 2023-08-04 ENCOUNTER — Telehealth: Payer: Self-pay | Admitting: Psychiatry

## 2023-08-04 ENCOUNTER — Other Ambulatory Visit: Payer: Self-pay

## 2023-08-04 NOTE — Telephone Encounter (Signed)
 Courtni called requesting a refill on her Xanax [facility]    Phone: [phone]  Fax: [phone]

## 2023-08-04 NOTE — Telephone Encounter (Signed)
 Michaela Jones requesting a refill on her Xanax [facility]    Phone: [phone]  Fax: [phone]

## 2023-08-04 NOTE — Telephone Encounter (Signed)
 Called patient and she said the problem has been resolved.

## 2023-08-05 ENCOUNTER — Other Ambulatory Visit: Payer: Self-pay

## 2023-08-05 NOTE — Telephone Encounter (Signed)
 Requested medication (s) are due for refill today: review  Requested medication (s) are on the active medication list: yes  Last refill:  Hydrocodone  06/30/23, promethazine  DM 07/04/23  Future visit scheduled: no  Notes to clinic:  Unable to refill per protocol, cannot delegate.    Requested Prescriptions  Pending Prescriptions Disp Refills   HYDROcodone -acetaminophen  (NORCO) 10-325 MG tablet 100 tablet 0    Sig: Take 1 tablet by mouth 4 (four) times daily as needed.     Not Delegated - Analgesics:  Opioid Agonist Combinations Failed - 08/05/2023  5:26 PM      Failed - This refill cannot be delegated      Passed - Urine Drug Screen completed in last 360 days      Passed - Valid encounter within last 3 months    Recent Outpatient Visits           1 week ago Upper respiratory tract infection, unspecified type   [facility] Nancyann BRAVO, [facility]   2 weeks ago Lumbar postlaminectomy syndrome   [place] [facility] Nancyann BRAVO, MD   1 month ago Flu-like symptoms   [place] [facility] Nancyann BRAVO, MD   5 months ago No-show for appointment   [facility] Nancyann B[facility], MD   6 months ago Chronic left shoulder pain   [place] [facility] Nancyann BRAVO, MD               promethazine -dextromethorphan  (PROMETHAZINE -DM) 6.25-15 MG/5ML syrup 118 mL 0    Sig: Take 5 mLs by mouth 4 (four) times daily as needed for cough.     Ear, Nose, and Throat:  Antitussives/Expectorants Passed - 08/05/2023  5:26 PM      Passed - Valid encounter within last 12 months    Recent Outpatient Visits           1 week ago Upper respiratory tract infection, unspecified type   [facility] Nancyann BRAVO, MD   2 weeks ago Lumbar postlaminectomy syndrome    [facility] B[facility], MD   1 month ago Flu-like symptoms   [facility]ann BRAVO, MD   5 months ago No-show for appointment   [facility] B[facility], MD   6 months ago Chronic left shoulder pain   [facility] Nancyann BRAVO, MD

## 2023-08-06 ENCOUNTER — Other Ambulatory Visit: Payer: Self-pay

## 2023-08-06 MED FILL — Hydrocodone-Acetaminophen Tab 10-325 MG: ORAL | 25 days supply | Qty: 100 | Fill #0 | Status: CN

## 2023-08-06 MED FILL — Promethazine-DM Syrup 6.25-15 MG/5ML: ORAL | 6 days supply | Qty: 118 | Fill #0 | Status: AC

## 2023-08-07 ENCOUNTER — Encounter: Payer: Self-pay | Admitting: Oncology

## 2023-08-07 ENCOUNTER — Other Ambulatory Visit: Payer: Self-pay

## 2023-08-07 ENCOUNTER — Telehealth: Payer: Self-pay | Admitting: Family Medicine

## 2023-08-07 MED FILL — Alprazolam Tab ER 24HR 1 MG: ORAL | 30 days supply | Qty: 30 | Fill #2 | Status: CN

## 2023-08-07 MED FILL — Hydrocodone-Acetaminophen Tab 10-325 MG: ORAL | 25 days supply | Qty: 100 | Fill #0 | Status: AC

## 2023-08-07 NOTE — Telephone Encounter (Signed)
 PA submitted in Cover My Meds for Hydrocodone-Acetaminophen.   Lovette Cliche (Key: [id number]) PA Case ID #: [id number] Rx #: [id number]

## 2023-08-07 NOTE — Telephone Encounter (Signed)
 [facility] health care [facility] is requesting prior authorization  Key: [id number] HYDROcodone- Actaminophen 10-325 MG tablets

## 2023-08-07 NOTE — Telephone Encounter (Signed)
 Plan Member Name: Michaela Jones Plan Member ID:   Prescriber Name: NANCYANN PERRY Prescriber Phone: [phone] Prescriber Fax: [phone]  Plan-approved Criteria used for review: Opioids IR - 7-Day APAP-ASA-IBU Combo Products - Acute Pain Duration Limit  Dear [name]:  [facility] [employer]  received a request from your provider for coverage of HydrocodoneAcetaminophen Tab 10-325 mg.  As long as you remain covered by the [facility] and there are no changes to your plan benefits, this request is APPROVED for the following time period: 08/07/2023 - 07/09/202  Approvals may be subject to dosing limits in accordance with FDA approved labeling, evidence-based practice guidelines or your pharmacy plan benefits.

## 2023-08-08 ENCOUNTER — Other Ambulatory Visit: Payer: Self-pay

## 2023-08-10 MED FILL — Promethazine HCl Tab 25 MG: ORAL | 10 days supply | Qty: 30 | Fill #2 | Status: AC

## 2023-08-11 ENCOUNTER — Ambulatory Visit (INDEPENDENT_AMBULATORY_CARE_PROVIDER_SITE_OTHER): Payer: Self-pay | Admitting: Adult Health

## 2023-08-11 ENCOUNTER — Other Ambulatory Visit: Payer: Self-pay

## 2023-08-11 ENCOUNTER — Encounter: Payer: Self-pay | Admitting: Oncology

## 2023-08-11 DIAGNOSIS — Z0389 Encounter for observation for other suspected diseases and conditions ruled out: Secondary | ICD-10-CM

## 2023-08-11 MED FILL — Alprazolam Tab ER 24HR 1 MG: ORAL | 30 days supply | Qty: 30 | Fill #2 | Status: AC

## 2023-08-11 NOTE — Progress Notes (Signed)
 Patient no show appointment. ? ?

## 2023-08-19 ENCOUNTER — Ambulatory Visit: Payer: Self-pay | Admitting: Family Medicine
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
## 2023-08-20 ENCOUNTER — Other Ambulatory Visit: Payer: Self-pay | Admitting: Family Medicine

## 2023-08-20 ENCOUNTER — Other Ambulatory Visit: Payer: Self-pay

## 2023-08-20 ENCOUNTER — Other Ambulatory Visit: Payer: Self-pay | Admitting: Psychiatry

## 2023-08-20 ENCOUNTER — Telehealth (INDEPENDENT_AMBULATORY_CARE_PROVIDER_SITE_OTHER): Payer: Self-pay | Admitting: Family Medicine

## 2023-08-20 DIAGNOSIS — F419 Anxiety disorder, unspecified: Secondary | ICD-10-CM

## 2023-08-20 DIAGNOSIS — F411 Generalized anxiety disorder: Secondary | ICD-10-CM

## 2023-08-20 DIAGNOSIS — F909 Attention-deficit hyperactivity disorder, unspecified type: Secondary | ICD-10-CM

## 2023-08-20 DIAGNOSIS — F5101 Primary insomnia: Secondary | ICD-10-CM

## 2023-08-20 DIAGNOSIS — E538 Deficiency of other specified B group vitamins: Secondary | ICD-10-CM

## 2023-08-20 DIAGNOSIS — G4719 Other hypersomnia: Secondary | ICD-10-CM

## 2023-08-20 DIAGNOSIS — D509 Iron deficiency anemia, unspecified: Secondary | ICD-10-CM

## 2023-08-20 DIAGNOSIS — M961 Postlaminectomy syndrome, not elsewhere classified: Secondary | ICD-10-CM

## 2023-08-20 DIAGNOSIS — R5383 Other fatigue: Secondary | ICD-10-CM

## 2023-08-20 DIAGNOSIS — F1190 Opioid use, unspecified, uncomplicated: Secondary | ICD-10-CM

## 2023-08-20 DIAGNOSIS — E559 Vitamin D deficiency, unspecified: Secondary | ICD-10-CM

## 2023-08-20 DIAGNOSIS — Z136 Encounter for screening for cardiovascular disorders: Secondary | ICD-10-CM

## 2023-08-20 DIAGNOSIS — E039 Hypothyroidism, unspecified: Secondary | ICD-10-CM

## 2023-08-20 NOTE — Progress Notes (Unsigned)
MyChart Video Visit    Virtual Visit via Video Note   This format is felt to be most appropriate for this patient at this time. Physical exam was limited by quality of the video and audio technology used for the visit.   Patient location: *** Provider location: ***  I discussed the limitations of evaluation and management by telemedicine and the availability of in person appointments. The patient expressed understanding and agreed to proceed.  Patient: Michaela Jones   DOB: 17-May-1976   48 y.o. Female  MRN: [id number] Visit Date: 08/20/2023  Today's healthcare provider: [name], MD   No chief complaint on file.  Subjective    HPI  ***  Medications: Outpatient Medications Prior to Visit  Medication Sig   albuterol (VENTOLIN HFA) 108 (90 Base) MCG/ACT inhaler Inhale 2 puffs into the lungs every 6 (six) hours as needed for wheezing or shortness of breath.   ALPRAZolam (XANAX XR) 1 MG 24 hr tablet Take 1 tablet (1 mg total) by mouth at bedtime.   ALPRAZolam (XANAX) 1 MG tablet Take 2 tablets (2 mg total) by mouth at bedtime. May take an additional tablet as needed   apixaban (ELIQUIS) 5 MG TABS tablet Take 1 tablet (5 mg total) by mouth 2 (two) times daily.   Armodafinil 150 MG tablet Take 1 tablet (150 mg total) by mouth daily.   Azelastine-Fluticasone 137-50 MCG/ACT SUSP Place 1 spray into the nose every 12 (twelve) hours.   busPIRone (BUSPAR) 15 MG tablet Take 1/3 tablet p.o. twice daily for 1 week, then take 2/3 tablet p.o. twice daily for 1 week, then take 1 tablet p.o. twice daily   celecoxib (CELEBREX) 100 MG capsule Take 1 capsule (100 mg total) by mouth 2 (two) times daily as needed.   cetirizine (ZYRTEC) 10 MG tablet Take 1 tablet (10 mg total) by mouth daily as needed for allergies.   cholecalciferol (VITAMIN D3) 25 MCG (1000 UNIT) tablet Take 1,000 Units by mouth daily.   cyanocobalamin (VITAMIN B12) 1000 MCG/ML injection Inject 1 mL (1,000 mcg total) into the  skin every 14 (fourteen) days (2 weeks) for 3 months then 1ml once a month   HYDROcodone-acetaminophen (NORCO) 10-325 MG tablet Take 1 tablet by mouth 4 (four) times daily as needed.   levothyroxine (SYNTHROID) 150 MCG tablet Take 1 tablet (150 mcg total) by mouth daily. Please schedule an office visit before anymore refills.   methocarbamol (ROBAXIN) 500 MG tablet Take 1-2 tablets (500-1,000 mg total) by mouth every 6 (six) hours as needed for muscle spasms.   montelukast (SINGULAIR) 10 MG tablet Take 1 tablet (10 mg total) by mouth at bedtime.   Multiple Vitamin (MULTIVITAMIN) tablet Take 1 tablet by mouth daily.   oxyCODONE-acetaminophen (PERCOCET/ROXICET) 5-325 MG tablet Take 1 tablet by mouth daily as needed.   promethazine (PHENERGAN) 25 MG tablet Take 1 tablet (25 mg total) by mouth every 8 (eight) hours as needed for nausea or vomiting.   promethazine-dextromethorphan (PROMETHAZINE-DM) 6.25-15 MG/5ML syrup Take 5 mLs by mouth 4 (four) times daily as needed for cough.   Rimegepant Sulfate (NURTEC) 75 MG TBDP Take 1 tablet (75 mg total) by mouth every other day.   No facility-administered medications prior to visit.   Review of Systems {Insert previous labs (optional):23779} {See past labs  Heme  Chem  Endocrine  Serology  Results Review (optional):1}   Objective    There were no vitals taken for this visit. {Insert last BP/Wt (optional):23777}{See  vitals history (optional):1}  Physical Exam  Awake, alert, oriented x 3. In no apparent distress***   Assessment & Plan     ***  No follow-ups on file.     I discussed the assessment and treatment plan with the patient. The patient was provided an opportunity to ask questions and all were answered. The patient agreed with the plan and demonstrated an understanding of the instructions.   The patient was advised to call back or seek an in-person evaluation if the symptoms worsen or if the condition fails to improve as  anticipated.  I provided 20 minutes of non-face-to-face time during this encounter.   [name], MD [facility] [phone] (phone) [phone] (fax)  [facility]

## 2023-08-21 ENCOUNTER — Other Ambulatory Visit: Payer: Self-pay

## 2023-08-21 ENCOUNTER — Other Ambulatory Visit: Payer: Self-pay | Admitting: Family Medicine

## 2023-08-21 DIAGNOSIS — M961 Postlaminectomy syndrome, not elsewhere classified: Secondary | ICD-10-CM

## 2023-08-21 DIAGNOSIS — F1190 Opioid use, unspecified, uncomplicated: Secondary | ICD-10-CM

## 2023-08-21 NOTE — Telephone Encounter (Signed)
Requested medication (s) are due for refill today: yes  Requested medication (s) are on the active medication list: yes  Last refill:  08/06/23  #100  Future visit scheduled: no  Notes to clinic:  med not delegated to NT to RF   Requested Prescriptions  Pending Prescriptions Disp Refills   HYDROcodone-acetaminophen (NORCO) 10-325 MG tablet 100 tablet 0    Sig: Take 1 tablet by mouth 4 (four) times daily as needed.     Not Delegated - Analgesics:  Opioid Agonist Combinations Failed - 08/21/2023  8:26 AM      Failed - This refill cannot be delegated      Passed - Urine Drug Screen completed in last 360 days      Passed - Valid encounter within last 3 months    Recent Outpatient Visits           Yesterday Excessive daytime sleepiness   [place] [facility] Malva Limes, MD   3 weeks ago Upper respiratory tract infection, unspecified type   [facility], [facility]   1 month ago Lumbar postlaminectomy syndrome   [facility] Malva Limes, MD   2 months ago Flu-like symptoms   [place] [facility] L[facility], [facility]   5 months ago No-show for appointment   [facility] Malva Limes, MD

## 2023-08-21 NOTE — Telephone Encounter (Signed)
Requested medication (s) are due for refill today: yes  Requested medication (s) are on the active medication list: yes  Last refill:  08/06/23/ #100  Future visit scheduled: no  Notes to clinic:  med not delegated to NT to RF    Requested Prescriptions  Pending Prescriptions Disp Refills   HYDROcodone-acetaminophen (NORCO) 10-325 MG tablet 100 tablet 0    Sig: Take 1 tablet by mouth 4 (four) times daily as needed.     Not Delegated - Analgesics:  Opioid Agonist Combinations Failed - 08/21/2023  1:19 PM      Failed - This refill cannot be delegated      Passed - Urine Drug Screen completed in last 360 days      Passed - Valid encounter within last 3 months    Recent Outpatient Visits           Yesterday Excessive daytime sleepiness   [facility] Malva Limes, MD   3 weeks ago Upper respiratory tract infection, unspecified type   [facility] Malva Limes, MD   1 month ago Lumbar postlaminectomy syndrome   [place] [facility] Malva Limes, MD   2 months ago Flu-like symptoms   [place] [facility] Malva Limes, MD   5 months ago No-show for appointment   [facility] Malva Limes, MD

## 2023-08-22 ENCOUNTER — Other Ambulatory Visit: Payer: Self-pay

## 2023-08-24 ENCOUNTER — Other Ambulatory Visit: Payer: Self-pay

## 2023-08-25 ENCOUNTER — Other Ambulatory Visit: Payer: Self-pay | Admitting: Family Medicine

## 2023-08-25 ENCOUNTER — Other Ambulatory Visit: Payer: Self-pay

## 2023-08-25 MED ORDER — ALPRAZOLAM 1 MG PO TABS
ORAL_TABLET | ORAL | 0 refills | Status: DC
  Filled 2023-08-25: qty 11, 3d supply, fill #0

## 2023-08-25 NOTE — Telephone Encounter (Signed)
Requested medication (s) are due for refill today: yes  Requested medication (s) are on the active medication list: yes  Last refill:  07/20/23  Future visit scheduled: yes  Notes to clinic:  Unable to refill per protocol, cannot delegate.      Requested Prescriptions  Pending Prescriptions Disp Refills   oxyCODONE-acetaminophen (PERCOCET/ROXICET) 5-325 MG tablet 7 tablet 0    Sig: Take 1 tablet by mouth daily as needed.     Not Delegated - Analgesics:  Opioid Agonist Combinations Failed - 08/25/2023  1:39 PM      Failed - This refill cannot be delegated      Passed - Urine Drug Screen completed in last 360 days      Passed - Valid encounter within last 3 months    Recent Outpatient Visits           5 days ago Excessive daytime sleepiness   [facility] Malva Limes, MD   3 weeks ago Upper respiratory tract infection, unspecified type   [facility] Malva Limes, MD   1 month ago Lumbar postlaminectomy syndrome   [place] [facility] Malva Limes, MD   2 months ago Flu-like symptoms   [facility] Malva Limes, MD   5 months ago No-show for appointment   [facility] Malva Limes, MD

## 2023-08-26 ENCOUNTER — Other Ambulatory Visit: Payer: Self-pay

## 2023-08-26 ENCOUNTER — Other Ambulatory Visit: Payer: Self-pay | Admitting: Family Medicine

## 2023-08-26 MED FILL — Oxycodone w/ Acetaminophen Tab 5-325 MG: ORAL | 7 days supply | Qty: 7 | Fill #0 | Status: AC

## 2023-08-27 ENCOUNTER — Other Ambulatory Visit: Payer: Self-pay

## 2023-08-27 ENCOUNTER — Other Ambulatory Visit: Payer: Self-pay | Admitting: Family Medicine

## 2023-08-27 DIAGNOSIS — F1190 Opioid use, unspecified, uncomplicated: Secondary | ICD-10-CM

## 2023-08-27 DIAGNOSIS — M961 Postlaminectomy syndrome, not elsewhere classified: Secondary | ICD-10-CM

## 2023-08-27 MED ORDER — ALPRAZOLAM ER 1 MG PO TB24
1.0000 mg | ORAL_TABLET | Freq: Every evening | ORAL | 1 refills | Status: DC
  Filled 2023-09-16: qty 30, 30d supply, fill #0
  Filled 2023-09-30 – 2023-10-14 (×4): qty 30, 30d supply, fill #1

## 2023-08-27 MED ORDER — ALPRAZOLAM 1 MG PO TABS
1.0000 mg | ORAL_TABLET | Freq: Three times a day (TID) | ORAL | 0 refills | Status: DC
  Filled 2023-08-27: qty 21, 7d supply, fill #0
  Filled 2023-08-28: qty 21, fill #0
  Filled 2023-08-28: qty 21, 7d supply, fill #0

## 2023-08-27 MED ORDER — LURASIDONE HCL 40 MG PO TABS
ORAL_TABLET | ORAL | 0 refills | Status: DC
  Filled 2023-08-27 – 2023-09-02 (×2): qty 30, 34d supply, fill #0
  Filled 2023-09-10 – 2023-09-22 (×2): qty 30, 30d supply, fill #0

## 2023-08-27 NOTE — Telephone Encounter (Signed)
Requested medication (s) are due for refill today: yes  Requested medication (s) are on the active medication list: yes  Last refill:  08/06/23  Future visit scheduled: no  Notes to clinic:  Unable to refill per protocol, cannot delegate.      Requested Prescriptions  Pending Prescriptions Disp Refills   HYDROcodone-acetaminophen (NORCO) 10-325 MG tablet 100 tablet 0    Sig: Take 1 tablet by mouth 4 (four) times daily as needed.     Not Delegated - Analgesics:  Opioid Agonist Combinations Failed - 08/27/2023 12:03 PM      Failed - This refill cannot be delegated      Passed - Urine Drug Screen completed in last 360 days      Passed - Valid encounter within last 3 months    Recent Outpatient Visits           1 week ago Excessive daytime sleepiness   [place] Malva Limes, MD   4 weeks ago Upper respiratory tract infection, unspecified type   [facility] Malva Limes, MD   1 month ago Lumbar postlaminectomy syndrome   [place] [facility] Malva Limes, MD   2 months ago Flu-like symptoms   [place] [facility] Malva Limes, MD   5 months ago No-show for appointment   [facility] Malva Limes, MD

## 2023-08-28 ENCOUNTER — Other Ambulatory Visit: Payer: Self-pay | Admitting: Family Medicine

## 2023-08-28 ENCOUNTER — Other Ambulatory Visit: Payer: Self-pay

## 2023-08-28 DIAGNOSIS — F1190 Opioid use, unspecified, uncomplicated: Secondary | ICD-10-CM

## 2023-08-28 DIAGNOSIS — M961 Postlaminectomy syndrome, not elsewhere classified: Secondary | ICD-10-CM

## 2023-08-29 ENCOUNTER — Other Ambulatory Visit: Payer: Self-pay

## 2023-08-31 ENCOUNTER — Other Ambulatory Visit: Payer: Self-pay

## 2023-08-31 MED FILL — Hydrocodone-Acetaminophen Tab 10-325 MG: ORAL | 25 days supply | Qty: 100 | Fill #0 | Status: AC

## 2023-09-01 ENCOUNTER — Other Ambulatory Visit: Payer: Self-pay

## 2023-09-02 ENCOUNTER — Other Ambulatory Visit: Payer: Self-pay

## 2023-09-02 ENCOUNTER — Encounter: Payer: Self-pay | Admitting: Oncology

## 2023-09-02 ENCOUNTER — Other Ambulatory Visit: Payer: Self-pay | Admitting: Family Medicine

## 2023-09-03 ENCOUNTER — Other Ambulatory Visit: Payer: Self-pay

## 2023-09-03 MED ORDER — ALPRAZOLAM 1 MG PO TABS
1.0000 mg | ORAL_TABLET | Freq: Three times a day (TID) | ORAL | 0 refills | Status: DC
  Filled 2023-09-03: qty 21, 7d supply, fill #0

## 2023-09-04 ENCOUNTER — Other Ambulatory Visit: Payer: Self-pay

## 2023-09-04 MED ORDER — PROMETHAZINE HCL 25 MG PO TABS
25.0000 mg | ORAL_TABLET | Freq: Three times a day (TID) | ORAL | 2 refills | Status: DC | PRN
  Filled 2023-09-04: qty 30, 10d supply, fill #0
  Filled 2024-02-25: qty 30, 10d supply, fill #1
  Filled 2024-04-24: qty 30, 10d supply, fill #2

## 2023-09-05 ENCOUNTER — Other Ambulatory Visit: Payer: Self-pay

## 2023-09-05 MED ORDER — LURASIDONE HCL 20 MG PO TABS
10.0000 mg | ORAL_TABLET | Freq: Every evening | ORAL | 0 refills | Status: DC
  Filled 2023-09-05 – 2023-09-17 (×6): qty 30, 30d supply, fill #0

## 2023-09-08 ENCOUNTER — Ambulatory Visit: Payer: 59 | Admitting: Behavioral Health

## 2023-09-09 ENCOUNTER — Other Ambulatory Visit: Payer: Self-pay

## 2023-09-09 ENCOUNTER — Encounter: Payer: Self-pay | Admitting: Oncology

## 2023-09-09 MED ORDER — ALPRAZOLAM 1 MG PO TABS
1.0000 mg | ORAL_TABLET | Freq: Three times a day (TID) | ORAL | 0 refills | Status: DC
  Filled 2023-09-09: qty 21, 7d supply, fill #0

## 2023-09-10 ENCOUNTER — Other Ambulatory Visit: Payer: Self-pay

## 2023-09-11 ENCOUNTER — Encounter: Payer: Self-pay | Admitting: Oncology

## 2023-09-11 ENCOUNTER — Other Ambulatory Visit: Payer: Self-pay

## 2023-09-12 ENCOUNTER — Other Ambulatory Visit: Payer: Self-pay

## 2023-09-16 ENCOUNTER — Other Ambulatory Visit: Payer: Self-pay

## 2023-09-16 MED ORDER — ALPRAZOLAM 1 MG PO TABS
1.0000 mg | ORAL_TABLET | Freq: Three times a day (TID) | ORAL | 0 refills | Status: DC
  Filled 2023-09-16: qty 21, 7d supply, fill #0

## 2023-09-17 ENCOUNTER — Telehealth: Payer: Self-pay

## 2023-09-17 ENCOUNTER — Other Ambulatory Visit: Payer: Self-pay

## 2023-09-17 NOTE — Telephone Encounter (Signed)
 Prior Authorization submitted and approved for Lurasidone 20 mg with [employer] effective through 09/15/2026.

## 2023-09-18 ENCOUNTER — Other Ambulatory Visit: Payer: Self-pay

## 2023-09-22 ENCOUNTER — Other Ambulatory Visit: Payer: Self-pay

## 2023-09-22 ENCOUNTER — Other Ambulatory Visit: Payer: Self-pay | Admitting: Family Medicine

## 2023-09-22 DIAGNOSIS — F1190 Opioid use, unspecified, uncomplicated: Secondary | ICD-10-CM

## 2023-09-22 DIAGNOSIS — M961 Postlaminectomy syndrome, not elsewhere classified: Secondary | ICD-10-CM

## 2023-09-22 MED ORDER — ALPRAZOLAM 1 MG PO TABS
1.0000 mg | ORAL_TABLET | Freq: Three times a day (TID) | ORAL | 0 refills | Status: DC
  Filled 2023-09-22: qty 21, 7d supply, fill #0

## 2023-09-23 ENCOUNTER — Other Ambulatory Visit: Payer: Self-pay

## 2023-09-23 MED FILL — Hydrocodone-Acetaminophen Tab 10-325 MG: ORAL | 25 days supply | Qty: 100 | Fill #0 | Status: CN

## 2023-09-24 ENCOUNTER — Encounter: Payer: Self-pay | Admitting: Oncology

## 2023-09-24 ENCOUNTER — Other Ambulatory Visit: Payer: Self-pay

## 2023-09-24 MED ORDER — TRIAZOLAM 0.25 MG PO TABS
0.2500 mg | ORAL_TABLET | ORAL | 0 refills | Status: DC
  Filled 2023-09-24: qty 1, 1d supply, fill #0

## 2023-09-24 MED FILL — Hydrocodone-Acetaminophen Tab 10-325 MG: ORAL | 25 days supply | Qty: 100 | Fill #0 | Status: AC

## 2023-09-30 ENCOUNTER — Other Ambulatory Visit: Payer: Self-pay | Admitting: Family Medicine

## 2023-09-30 ENCOUNTER — Other Ambulatory Visit: Payer: Self-pay

## 2023-09-30 DIAGNOSIS — J069 Acute upper respiratory infection, unspecified: Secondary | ICD-10-CM

## 2023-09-30 MED ORDER — ALPRAZOLAM 1 MG PO TABS
1.0000 mg | ORAL_TABLET | Freq: Three times a day (TID) | ORAL | 0 refills | Status: DC
  Filled 2023-09-30: qty 21, 7d supply, fill #0

## 2023-10-01 ENCOUNTER — Ambulatory Visit: Payer: Self-pay | Admitting: Family Medicine

## 2023-10-01 ENCOUNTER — Other Ambulatory Visit: Payer: Self-pay

## 2023-10-01 MED FILL — Promethazine-DM Syrup 6.25-15 MG/5ML: ORAL | 6 days supply | Qty: 118 | Fill #0 | Status: CN

## 2023-10-01 NOTE — Telephone Encounter (Signed)
 Chief Complaint: Cough Symptoms: fever 101.9 lowest/highest 103, non-productive cough w/congested cough, chest pain w/coughing Frequency: Onset Monday night Pertinent Negatives: Patient denies other symptoms Disposition: [] ED /[] Urgent Care (no appt availability in office) / [x] Appointment(In office/virtual)/ []  [facility]/ [] Home Care/ [] Refused Recommended Disposition /[] [facility]/ []  Follow-up with PCP Additional Notes: Patient says her husband had the same symptoms last week and she got sick on Monday night, highest temp 103, taking Tylenol, using inhaler for wheezing at times, promethazine cough medicine from a previous visit is not helping, robitussin DM not helping the cough. She says she's tried steam and vicks vaporub as well. Her chest hurts with coughing and there is congestion, but nothing is coming up, wet cough. Advised virtual visit, she agrees, nothing available with PCP until later in March, agreed to see a different provider, scheduled tomorrow.

## 2023-10-01 NOTE — Telephone Encounter (Signed)
 Copied from CRM [id number]. Topic: Clinical - Red Word Triage >> Oct 01, 2023  1:31 PM Ivette P wrote: [facility] that prompted transfer to Nurse Triage: Started Monday night fever from 103-101  Cough, congestion, coughing so hard will pee.   Taking over the counter. Reason for Disposition  Fever present > 3 days (72 hours)  Answer Assessment - Initial Assessment Questions 1. ONSET: "When did the cough begin?"      Monday night 2. SEVERITY: "How bad is the cough today?"      9 3. SPUTUM: "Describe the color of your sputum" (none, dry cough; clear, white, yellow, green)     Wet cough w/congestion 4. HEMOPTYSIS: "Are you coughing up any blood?" If so ask: "How much?" (flecks, streaks, tablespoons, etc.)     No 5. DIFFICULTY BREATHING: "Are you having difficulty breathing?" If Yes, ask: "How bad is it?" (e.g., mild, moderate, severe)    - MILD: No SOB at rest, mild SOB with walking, speaks normally in sentences, can lie down, no retractions, pulse < 100.    - MODERATE: SOB at rest, SOB with minimal exertion and prefers to sit, cannot lie down flat, speaks in phrases, mild retractions, audible wheezing, pulse 100-120.    - SEVERE: Very SOB at rest, speaks in single words, struggling to breathe, sitting hunched forward, retractions, pulse > 120      Mild 6. FEVER: "Do you have a fever?" If Yes, ask: "What is your temperature, how was it measured, and when did it start?"     101.9 7. CARDIAC HISTORY: "Do you have any history of heart disease?" (e.g., heart attack, congestive heart failure)      No 8. LUNG HISTORY: "Do you have any history of lung disease?"  (e.g., pulmonary embolus, asthma, emphysema)     No 9. PE RISK FACTORS: "Do you have a history of blood clots?" (or: recent major surgery, recent prolonged travel, bedridden)     Factor 5 blood clotting disorder 10. OTHER SYMPTOMS: "Do you have any other symptoms?" (e.g., runny nose, wheezing, chest pain)       Wheezing, cough, chest pain  w/coughing 11. TRAVEL: "Have you traveled out of the country in the last month?" (e.g., travel history, exposures)       Exposed to husband who had same symptoms  Protocols used: Cough - Acute Non-Productive-A-AH

## 2023-10-01 NOTE — Telephone Encounter (Signed)
 Requested Prescriptions  Pending Prescriptions Disp Refills   promethazine-dextromethorphan (PROMETHAZINE-DM) 6.25-15 MG/5ML syrup 118 mL 0    Sig: Take 5 mLs by mouth 4 (four) times daily as needed for cough.     Ear, Nose, and Throat:  Antitussives/Expectorants Passed - 10/01/2023  1:42 PM      Passed - Valid encounter within last 12 months    Recent Outpatient Visits           1 month ago Anxiety   [facility] Malva Limes, MD   2 months ago Upper respiratory tract infection, unspecified type   [facility] Malva Limes, MD   2 months ago Lumbar postlaminectomy syndrome   [facility] Malva Limes, MD   3 months ago Flu-like symptoms   [facility] Malva Limes, MD   6 months ago No-show for appointment   [facility] Malva Limes, MD

## 2023-10-02 ENCOUNTER — Telehealth: Payer: Self-pay | Admitting: Family Medicine

## 2023-10-02 ENCOUNTER — Encounter: Payer: Self-pay | Admitting: Family Medicine

## 2023-10-02 ENCOUNTER — Other Ambulatory Visit: Payer: Self-pay

## 2023-10-02 DIAGNOSIS — R509 Fever, unspecified: Secondary | ICD-10-CM

## 2023-10-02 DIAGNOSIS — J111 Influenza due to unidentified influenza virus with other respiratory manifestations: Secondary | ICD-10-CM

## 2023-10-02 MED ORDER — GUAIFENESIN-CODEINE 100-10 MG/5ML PO SOLN
10.0000 mL | Freq: Three times a day (TID) | ORAL | 0 refills | Status: DC | PRN
  Filled 2023-10-02: qty 120, 4d supply, fill #0

## 2023-10-02 NOTE — Telephone Encounter (Signed)
 Noted.

## 2023-10-02 NOTE — Progress Notes (Signed)
 MyChart Video Visit    Virtual Visit via Video Note   This format is felt to be most appropriate for this patient at this time. Physical exam was limited by quality of the video and audio technology used for the visit.    Patient location: home Provider location: [facility] Persons involved in the visit: patient, provider  I discussed the limitations of evaluation and management by telemedicine and the availability of in person appointments. The patient expressed understanding and agreed to proceed.  Patient: Michaela Jones   DOB: April 24, 1976   48 y.o. Female  MRN: [id number] Visit Date: 10/02/2023  PCP:  Malva Limes, MD   Today's healthcare provider: Shirlee Latch, MD   No chief complaint on file.  Subjective    HPI   Discussed the use of AI scribe software for clinical note transcription with the patient, who gave verbal consent to proceed.  History of Present Illness   Michaela Jones, a patient with a history of gastric bypass surgery and a factor five blood condition, presents with flu-like symptoms that have been persisting for several days. The symptoms began after her husband and nephew fell ill with similar symptoms. The patient reports a high fever, with temperatures reaching as high as 103 degrees. Despite taking Tylenol, the lowest she has been able to bring her fever down to is 100 degrees.  The patient also reports a persistent cough that has been causing discomfort in her chest and back. She describes the pain as extending from her chest down to her waist and feeling like she has been hit across the back. She has been using a leftover cough syrup, but it has not been effective in alleviating the cough.  In addition to the fever and cough, the patient has been experiencing wheezing. She has been using an inhaler to manage this symptom, which she typically only needs to use in the fall and spring. The patient has been trying to manage her  symptoms with over-the-counter medications, including Tylenol and Mucinex, and staying hydrated with tea and water.         Review of Systems      Objective    There were no vitals taken for this visit.      Physical Exam Constitutional:      General: She is not in acute distress.    Appearance: Normal appearance.  HENT:     Head: Normocephalic.  Pulmonary:     Effort: Pulmonary effort is normal. No respiratory distress.  Neurological:     Mental Status: She is alert and oriented to person, place, and time. Mental status is at baseline.        Assessment & Plan     Problem List Items Addressed This Visit   None Visit Diagnoses       Influenza-like illness    -  Primary     Fever, unspecified fever cause              Influenza-like illness Acute onset of symptoms consistent with influenza, including fever up to 103F, cough, and body aches. Symptoms began approximately 36 hours after her husband, who also exhibited similar symptoms. Influenza is suspected based on symptomatology and current community prevalence. She is outside the window for effective Tamiflu treatment, which is most beneficial within the first two days of symptom onset. - Continue symptomatic treatment with acetaminophen for fever management. - Prescribe codeine-containing cough syrup for cough relief, ensuring it is  not taken with hydrocodone or oxycodone. - Advise against antibiotics as the condition is viral. - Recommend obtaining pseudoephedrine for congestion relief, ensuring no hypertension.  Asthma (suspected) Reports wheezing and chest tightness when ill, suggesting possible asthma, though not formally diagnosed. Family history of asthma noted. Uses an inhaler during episodes of wheezing, particularly in fall and spring. - Continue use of inhaler as needed for wheezing.       Meds ordered this encounter  Medications   guaiFENesin-codeine 100-10 MG/5ML syrup    Sig: Take 10 mLs by  mouth 3 (three) times daily as needed for cough.    Dispense:  120 mL    Refill:  0     Return if symptoms worsen or fail to improve.     I discussed the assessment and treatment plan with the patient. The patient was provided an opportunity to ask questions and all were answered. The patient agreed with the plan and demonstrated an understanding of the instructions.   The patient was advised to call back or seek an in-person evaluation if the symptoms worsen or if the condition fails to improve as anticipated.  Total time spent on today's visit was greater than 30 minutes, including both face-to-face time (virtually) and nonface-to-face time personally spent on review of chart, treatment options, answering patient's questions, and coordinating care.    Shirlee Latch, MD [facility] [phone] (phone) [phone] (fax)  [facility]

## 2023-10-05 ENCOUNTER — Other Ambulatory Visit: Payer: Self-pay | Admitting: Family Medicine

## 2023-10-06 ENCOUNTER — Other Ambulatory Visit: Payer: Self-pay

## 2023-10-06 MED ORDER — ESCITALOPRAM OXALATE 10 MG PO TABS
10.0000 mg | ORAL_TABLET | Freq: Every day | ORAL | 0 refills | Status: DC
  Filled 2023-10-06: qty 30, 30d supply, fill #0

## 2023-10-06 MED ORDER — OXYCODONE-ACETAMINOPHEN 5-325 MG PO TABS
1.0000 | ORAL_TABLET | Freq: Every day | ORAL | 0 refills | Status: DC | PRN
  Filled 2023-10-06: qty 7, 30d supply, fill #0

## 2023-10-06 MED ORDER — ALPRAZOLAM 1 MG PO TABS
1.0000 mg | ORAL_TABLET | Freq: Three times a day (TID) | ORAL | 0 refills | Status: DC
Start: 2023-10-06 — End: 2024-04-28
  Filled 2023-10-06 (×2): qty 90, 30d supply, fill #0

## 2023-10-07 ENCOUNTER — Other Ambulatory Visit: Payer: Self-pay

## 2023-10-12 ENCOUNTER — Other Ambulatory Visit: Payer: Self-pay

## 2023-10-12 ENCOUNTER — Other Ambulatory Visit: Payer: Self-pay | Admitting: Family Medicine

## 2023-10-12 ENCOUNTER — Encounter: Payer: Self-pay | Admitting: Oncology

## 2023-10-12 DIAGNOSIS — M961 Postlaminectomy syndrome, not elsewhere classified: Secondary | ICD-10-CM

## 2023-10-12 DIAGNOSIS — F1190 Opioid use, unspecified, uncomplicated: Secondary | ICD-10-CM

## 2023-10-12 MED ORDER — CETIRIZINE HCL 10 MG PO TABS
10.0000 mg | ORAL_TABLET | Freq: Every day | ORAL | 2 refills | Status: AC | PRN
  Filled 2023-10-12: qty 90, 90d supply, fill #0

## 2023-10-12 MED ORDER — VITAMIN D 25 MCG (1000 UNIT) PO TABS
1000.0000 [IU] | ORAL_TABLET | Freq: Every day | ORAL | 4 refills | Status: AC
  Filled 2023-10-12: qty 90, 90d supply, fill #0

## 2023-10-12 MED ORDER — HYDROCODONE-ACETAMINOPHEN 10-325 MG PO TABS
1.0000 | ORAL_TABLET | Freq: Four times a day (QID) | ORAL | 0 refills | Status: DC | PRN
  Filled 2023-10-12 – 2023-10-17 (×4): qty 100, 25d supply, fill #0

## 2023-10-13 ENCOUNTER — Other Ambulatory Visit: Payer: Self-pay

## 2023-10-13 ENCOUNTER — Other Ambulatory Visit: Payer: Self-pay | Admitting: Family Medicine

## 2023-10-13 MED ORDER — MIRTAZAPINE 15 MG PO TABS
15.0000 mg | ORAL_TABLET | Freq: Every day | ORAL | 0 refills | Status: DC
  Filled 2023-10-13: qty 30, 30d supply, fill #0

## 2023-10-14 ENCOUNTER — Other Ambulatory Visit: Payer: Self-pay

## 2023-10-15 ENCOUNTER — Other Ambulatory Visit: Payer: Self-pay

## 2023-10-15 MED FILL — Guaifenesin-Codeine Soln 100-10 MG/5ML: ORAL | 4 days supply | Qty: 120 | Fill #0 | Status: AC

## 2023-10-16 ENCOUNTER — Other Ambulatory Visit: Payer: Self-pay

## 2023-10-17 ENCOUNTER — Other Ambulatory Visit: Payer: Self-pay

## 2023-10-17 ENCOUNTER — Encounter: Payer: Self-pay | Admitting: Oncology

## 2023-10-17 MED ORDER — LURASIDONE HCL 40 MG PO TABS
40.0000 mg | ORAL_TABLET | Freq: Every day | ORAL | 0 refills | Status: DC
  Filled 2023-10-17: qty 30, 30d supply, fill #0

## 2023-10-20 ENCOUNTER — Other Ambulatory Visit (HOSPITAL_COMMUNITY): Payer: Self-pay

## 2023-10-20 ENCOUNTER — Other Ambulatory Visit: Payer: Self-pay

## 2023-10-20 ENCOUNTER — Other Ambulatory Visit: Payer: Self-pay | Admitting: Family Medicine

## 2023-10-20 DIAGNOSIS — G43E09 Chronic migraine with aura, not intractable, without status migrainosus: Secondary | ICD-10-CM

## 2023-10-20 MED ORDER — LURASIDONE HCL 40 MG PO TABS
40.0000 mg | ORAL_TABLET | Freq: Every day | ORAL | 0 refills | Status: DC
  Filled 2023-10-20 – 2023-12-01 (×3): qty 30, 30d supply, fill #0

## 2023-10-21 ENCOUNTER — Other Ambulatory Visit: Payer: Self-pay | Admitting: Family Medicine

## 2023-10-21 ENCOUNTER — Other Ambulatory Visit: Payer: Self-pay

## 2023-10-21 DIAGNOSIS — G43E09 Chronic migraine with aura, not intractable, without status migrainosus: Secondary | ICD-10-CM

## 2023-10-21 MED FILL — Rimegepant Sulfate Tab Disint 75 MG: ORAL | 32 days supply | Qty: 16 | Fill #0 | Status: AC

## 2023-10-24 ENCOUNTER — Other Ambulatory Visit: Payer: Self-pay

## 2023-10-24 ENCOUNTER — Telehealth: Payer: Self-pay

## 2023-10-24 NOTE — Telephone Encounter (Signed)
 Copied from CRM [id number]. Topic: General - Other >> Oct 24, 2023  8:47 AM Patsy Lager T wrote: Reason for CRM: patients says she signed up for a rehab program and missed Wed, Thurs and Fri due to her constipation issues and she also had a fever. Patient is needing a note faxed to [employer] at [phone] to excuse her so she will not lose her spot in the program. Please f/u with patient

## 2023-10-25 ENCOUNTER — Other Ambulatory Visit: Payer: Self-pay

## 2023-10-27 ENCOUNTER — Other Ambulatory Visit: Payer: Self-pay

## 2023-10-27 MED ORDER — ESCITALOPRAM OXALATE 20 MG PO TABS
20.0000 mg | ORAL_TABLET | Freq: Every day | ORAL | 2 refills | Status: DC
  Filled 2023-10-27: qty 30, 30d supply, fill #0
  Filled 2023-12-01: qty 30, 30d supply, fill #1

## 2023-10-27 MED ORDER — MIRTAZAPINE 15 MG PO TABS
15.0000 mg | ORAL_TABLET | Freq: Every day | ORAL | 0 refills | Status: DC
  Filled 2023-10-27 – 2023-11-12 (×3): qty 30, 30d supply, fill #0

## 2023-10-27 MED ORDER — LURASIDONE HCL 20 MG PO TABS
20.0000 mg | ORAL_TABLET | Freq: Every evening | ORAL | 0 refills | Status: DC
  Filled 2023-10-27: qty 30, 30d supply, fill #0

## 2023-10-27 MED ORDER — ALPRAZOLAM 1 MG PO TABS
1.0000 mg | ORAL_TABLET | Freq: Three times a day (TID) | ORAL | 0 refills | Status: DC | PRN
  Filled 2023-10-28 – 2023-11-04 (×3): qty 90, 30d supply, fill #0

## 2023-10-28 ENCOUNTER — Other Ambulatory Visit: Payer: Self-pay

## 2023-10-28 ENCOUNTER — Encounter: Payer: Self-pay | Admitting: Family Medicine

## 2023-10-29 ENCOUNTER — Other Ambulatory Visit: Payer: Self-pay

## 2023-10-29 ENCOUNTER — Other Ambulatory Visit: Payer: Self-pay | Admitting: Family Medicine

## 2023-10-29 DIAGNOSIS — F1190 Opioid use, unspecified, uncomplicated: Secondary | ICD-10-CM

## 2023-10-29 DIAGNOSIS — M961 Postlaminectomy syndrome, not elsewhere classified: Secondary | ICD-10-CM

## 2023-10-30 ENCOUNTER — Other Ambulatory Visit: Payer: Self-pay

## 2023-10-30 ENCOUNTER — Other Ambulatory Visit: Payer: Self-pay | Admitting: Family Medicine

## 2023-10-30 DIAGNOSIS — M961 Postlaminectomy syndrome, not elsewhere classified: Secondary | ICD-10-CM

## 2023-10-30 DIAGNOSIS — F1190 Opioid use, unspecified, uncomplicated: Secondary | ICD-10-CM

## 2023-10-30 MED ORDER — TRIAZOLAM 0.125 MG PO TABS
0.3750 mg | ORAL_TABLET | Freq: Once | ORAL | 0 refills | Status: DC
  Filled 2023-10-30: qty 3, 1d supply, fill #0

## 2023-10-31 ENCOUNTER — Other Ambulatory Visit: Payer: Self-pay

## 2023-10-31 NOTE — Telephone Encounter (Signed)
 Requested medications are due for refill today.  A little too soon  Requested medications are on the active medications list.  yes  Last refill. 10/12/2023 #100 0 rf  Future visit scheduled.   yes  Notes to clinic.  Refill/refusal not delegated.    Requested Prescriptions  Pending Prescriptions Disp Refills   HYDROcodone-acetaminophen (NORCO) 10-325 MG tablet 100 tablet 0    Sig: Take 1 tablet by mouth 4 (four) times daily as needed.     Not Delegated - Analgesics:  Opioid Agonist Combinations Failed - 10/31/2023 12:18 PM      Failed - This refill cannot be delegated      Failed - Valid encounter within last 3 months    Recent Outpatient Visits           4 weeks ago Influenza-like illness   [facility] [place], Marzella Schlein, MD       Future Appointments             In 3 days Fisher, Demetrios Isaacs, MD [facility]            Passed - Urine Drug Screen completed in last 360 days

## 2023-11-02 ENCOUNTER — Other Ambulatory Visit: Payer: Self-pay

## 2023-11-02 MED FILL — Hydrocodone-Acetaminophen Tab 10-325 MG: ORAL | 25 days supply | Qty: 100 | Fill #0 | Status: CN

## 2023-11-03 ENCOUNTER — Other Ambulatory Visit: Payer: Self-pay

## 2023-11-03 ENCOUNTER — Encounter: Payer: Self-pay | Admitting: Oncology

## 2023-11-03 ENCOUNTER — Telehealth: Payer: Self-pay | Admitting: Family Medicine

## 2023-11-03 DIAGNOSIS — F419 Anxiety disorder, unspecified: Secondary | ICD-10-CM

## 2023-11-03 DIAGNOSIS — K5900 Constipation, unspecified: Secondary | ICD-10-CM | POA: Diagnosis not present

## 2023-11-03 DIAGNOSIS — Z713 Dietary counseling and surveillance: Secondary | ICD-10-CM | POA: Diagnosis not present

## 2023-11-03 DIAGNOSIS — M5417 Radiculopathy, lumbosacral region: Secondary | ICD-10-CM

## 2023-11-03 DIAGNOSIS — Z9884 Bariatric surgery status: Secondary | ICD-10-CM

## 2023-11-03 DIAGNOSIS — E669 Obesity, unspecified: Secondary | ICD-10-CM

## 2023-11-03 MED ORDER — PHENTERMINE HCL 15 MG PO CAPS
15.0000 mg | ORAL_CAPSULE | ORAL | 2 refills | Status: DC
  Filled 2023-11-03: qty 30, 30d supply, fill #0
  Filled 2024-01-26 – 2024-01-27 (×2): qty 30, 30d supply, fill #1
  Filled 2024-02-25: qty 30, 30d supply, fill #2

## 2023-11-03 MED FILL — Hydrocodone-Acetaminophen Tab 10-325 MG: ORAL | 25 days supply | Qty: 100 | Fill #0 | Status: CN

## 2023-11-03 NOTE — Patient Instructions (Signed)
 Marland Kitchen  Please review the attached list of medications and notify my office if there are any errors.   . Please bring all of your medications to every appointment so we can make sure that our medication list is the same as yours.

## 2023-11-04 ENCOUNTER — Other Ambulatory Visit: Payer: Self-pay

## 2023-11-04 MED ORDER — ALPRAZOLAM ER 1 MG PO TB24
1.0000 mg | ORAL_TABLET | Freq: Every day | ORAL | 0 refills | Status: DC
  Filled 2023-11-12: qty 30, 30d supply, fill #0

## 2023-11-04 MED ORDER — ALPRAZOLAM 0.25 MG PO TABS
0.7500 mg | ORAL_TABLET | ORAL | 0 refills | Status: DC
Start: 2023-11-04 — End: 2024-04-28
  Filled 2023-11-05: qty 3, 1d supply, fill #0

## 2023-11-04 MED FILL — Hydrocodone-Acetaminophen Tab 10-325 MG: ORAL | 25 days supply | Qty: 100 | Fill #0 | Status: CN

## 2023-11-05 ENCOUNTER — Encounter: Payer: Self-pay | Admitting: Oncology

## 2023-11-05 ENCOUNTER — Other Ambulatory Visit: Payer: Self-pay

## 2023-11-05 MED FILL — Hydrocodone-Acetaminophen Tab 10-325 MG: ORAL | 25 days supply | Qty: 100 | Fill #0 | Status: CN

## 2023-11-06 ENCOUNTER — Other Ambulatory Visit: Payer: Self-pay

## 2023-11-06 MED FILL — Hydrocodone-Acetaminophen Tab 10-325 MG: ORAL | 25 days supply | Qty: 100 | Fill #0 | Status: CN

## 2023-11-07 ENCOUNTER — Other Ambulatory Visit: Payer: Self-pay

## 2023-11-07 ENCOUNTER — Other Ambulatory Visit: Payer: Self-pay | Admitting: Family Medicine

## 2023-11-07 MED ORDER — OXYCODONE-ACETAMINOPHEN 5-325 MG PO TABS
1.0000 | ORAL_TABLET | Freq: Every day | ORAL | 0 refills | Status: DC | PRN
  Filled 2023-11-07: qty 7, 7d supply, fill #0

## 2023-11-11 ENCOUNTER — Other Ambulatory Visit: Payer: Self-pay

## 2023-11-11 MED FILL — Hydrocodone-Acetaminophen Tab 10-325 MG: ORAL | 25 days supply | Qty: 100 | Fill #0 | Status: AC

## 2023-11-12 ENCOUNTER — Other Ambulatory Visit: Payer: Self-pay

## 2023-11-21 NOTE — Progress Notes (Signed)
 MyChart Video Visit    Virtual Visit via Video Note   This format is felt to be most appropriate for this patient at this time. Physical exam was limited by quality of the video and audio technology used for the visit.   Patient location: home Provider location: [facility]  I discussed the limitations of evaluation and management by telemedicine and the availability of in person appointments. The patient expressed understanding and agreed to proceed.  Patient: Michaela Jones   DOB: [birth year]   48 y.o. Female  MRN: [id number] Visit Date: 11/03/2023  Today's healthcare provider: Jeralene Mom, MD   No chief complaint on file.  Subjective    Discussed the use of AI scribe software for clinical note transcription with the patient, who gave verbal consent to proceed.  History of Present Illness   Michaela Jones "Michaela Jones" is a 48 year old female who presents for medication management following discharge from therapy.  She is transitioning to part-time therapy after completing a four-week program and wants to ensure her primary care providers are updated on her current medication regimen. During therapy, her medications were adjusted to include Latuda , increased to 40 mg, Remeron  for sleep, and Xanax . She also started Lialda, which she believes is an antidepressant.  She is concerned about potential weight gain from these medications, particularly due to her history of gastric bypass surgery. She experiences constipation, which she attributes to her medications, and attempted to manage it with a laxative, resulting in overcompensation and a brief therapy stay. She is worried about the impact of her medications on her digestion and weight management.  Previously, she was started on Saxenda  by Michaela Jones at [facility] but did not fully utilize it before Michaela Jones left. She is considering options to manage her weight due to concerns about gaining weight from her current  medications.  She is currently managing her insurance coverage, which has been complicated by her employment status and recent changes. She is transitioning from state healthcare to the [facility] plan within the next 30 days.       Medications: Outpatient Medications Prior to Visit  Medication Sig   [DISCONTINUED] escitalopram  (LEXAPRO ) 10 MG tablet Take 1 tablet by mouth once a day   albuterol  (VENTOLIN  HFA) 108 (90 Base) MCG/ACT inhaler Inhale 2 puffs into the lungs every 6 (six) hours as needed for wheezing or shortness of breath.   ALPRAZolam  (XANAX  XR) 1 MG 24 hr tablet Take 1 tablet (1 mg total) by mouth at bedtime.   ALPRAZolam  (XANAX ) 1 MG tablet Take 1 tablet (1 mg total) by mouth 3 (three) times daily as needed for anxiety.   ALPRAZolam  (XANAX ) 1 MG tablet Take 1 tablet (1 mg total) by mouth 3 (three) times daily as needed for anxiety.   apixaban  (ELIQUIS ) 5 MG TABS tablet Take 1 tablet (5 mg total) by mouth 2 (two) times daily.   Armodafinil  150 MG tablet Take 1 tablet (150 mg total) by mouth daily.   Azelastine -Fluticasone  137-50 MCG/ACT SUSP Place 1 spray into the nose every 12 (twelve) hours.   celecoxib  (CELEBREX ) 100 MG capsule Take 1 capsule (100 mg total) by mouth 2 (two) times daily as needed.   cetirizine  (ZYRTEC ) 10 MG tablet Take 1 tablet (10 mg total) by mouth daily as needed for allergies.   cholecalciferol  (VITAMIN D3) 25 MCG (1000 UNIT) tablet Take 1 tablet (1,000 Units total) by mouth daily.   cyanocobalamin  (VITAMIN B12) 1000 MCG/ML injection Inject  1 mL (1,000 mcg total) into the skin every 14 (fourteen) days (2 weeks) for 3 months then 1ml once a month   escitalopram  (LEXAPRO ) 20 MG tablet Take 1 tablet (20 mg total) by mouth daily.   guaiFENesin -codeine  100-10 MG/5ML syrup Take 10 mLs by mouth 3 (three) times daily as needed for cough.   HYDROcodone -acetaminophen  (NORCO) 10-325 MG tablet Take 1 tablet by mouth 4 (four) times daily as needed.   levothyroxine   (SYNTHROID ) 150 MCG tablet Take 1 tablet (150 mcg total) by mouth daily. Please schedule an office visit before anymore refills.   lurasidone  (LATUDA ) 20 MG TABS tablet Take 1 tablet (20 mg total) by mouth every evening.   lurasidone  (LATUDA ) 40 MG TABS tablet Take 1 tablet by mouth once a day   methocarbamol  (ROBAXIN ) 500 MG tablet Take 1-2 tablets (500-1,000 mg total) by mouth every 6 (six) hours as needed for muscle spasms.   mirtazapine  (REMERON ) 15 MG tablet Take 1 tablet by mouth every night at bedtime   montelukast  (SINGULAIR ) 10 MG tablet Take 1 tablet (10 mg total) by mouth at bedtime.   Multiple Vitamin (MULTIVITAMIN) tablet Take 1 tablet by mouth daily.   Rimegepant Sulfate  (NURTEC) 75 MG TBDP Take 1 tablet (75 mg total) by mouth every other day.   promethazine  (PHENERGAN ) 25 MG tablet Take 1 tablet (25 mg total) by mouth every 8 (eight) hours as needed for nausea or vomiting.   [DISCONTINUED] ALPRAZolam  (XANAX  XR) 1 MG 24 hr tablet Take 1 tablet (1 mg total) by mouth at bedtime.   [DISCONTINUED] busPIRone  (BUSPAR ) 15 MG tablet Take 1/3 tablet p.o. twice daily for 1 week, then take 2/3 tablet p.o. twice daily for 1 week, then take 1 tablet p.o. twice daily   [DISCONTINUED] oxyCODONE -acetaminophen  (PERCOCET/ROXICET) 5-325 MG tablet Take 1 tablet by mouth daily as needed.   [DISCONTINUED] triazolam  (HALCION ) 0.125 MG tablet Take 3 tablets (0.375 mg total) by mouth once for 1 dose 1 hour prior to dental appointment. Do not drive while taking medication.   [DISCONTINUED] triazolam  (HALCION ) 0.25 MG tablet Take 1 tablet (0.25 mg total) by mouth 60 minutes prior to dental appointment. Do not drive while under the influence of this medication.   No facility-administered medications prior to visit.     Objective    There were no vitals taken for this visit. Wt Readings from Last 3 Encounters:  07/21/23 247 lb (112 kg)  02/04/23 236 lb (107 kg)  01/28/23 243 lb 6.2 oz (110.4 kg)        Physical Exam  Awake, alert, oriented x 3. In no apparent distress      Assessment & Plan       Medication Management Transitioning from inpatient to outpatient care with current medications: Latuda  40 mg, Remeron , Xanax , and an antidepressant. Concerns about side effects like constipation and weight gain. Advised to maintain Latuda  at 40 mg. - Update medication list in chart. - Coordinate with Camilo Cella for sleep medication management. - Monitor for side effects, including constipation and weight gain.  Constipation Experienced constipation likely due to medication, leading to laxative use and brief hospitalization. - Monitor bowel habits and adjust medications if necessary. - Provide letter for absence due to constipation-related issues.  Weight Management Concerned about weight gain from medications. Gastric bypass may predispose to GI side effects. Interested in weight management options that do not interfere with current medications. Phentermine  is viable; Zepbound may not be covered by insurance. - Prescribe phentermine  for  weight management. - Advise to check insurance coverage for Zepbound. - Provide letter for absence due to constipation-related issues.  Follow-up Requires follow-up for medication and weight management during outpatient transition. - Send phentermine  prescription to hospital pharmacy. - Provide letter for absence on last Wednesday, Thursday, and Friday of March. - Ensure medication list is updated in chart.    No follow-ups on file.     I discussed the assessment and treatment plan with the patient. The patient was provided an opportunity to ask questions and all were answered. The patient agreed with the plan and demonstrated an understanding of the instructions.   The patient was advised to call back or seek an in-person evaluation if the symptoms worsen or if the condition fails to improve as anticipated.  I provided 9 minutes of non-face-to-face  time during this encounter.   Jeralene Mom, MD [facility] [phone] (phone) [phone] (fax)  [facility]

## 2023-11-26 ENCOUNTER — Other Ambulatory Visit: Payer: Self-pay

## 2023-11-26 ENCOUNTER — Encounter: Payer: Self-pay | Admitting: Oncology

## 2023-11-26 ENCOUNTER — Other Ambulatory Visit: Payer: Self-pay | Admitting: Family Medicine

## 2023-11-26 MED ORDER — OXYCODONE-ACETAMINOPHEN 5-325 MG PO TABS
1.0000 | ORAL_TABLET | Freq: Every day | ORAL | 0 refills | Status: DC | PRN
  Filled 2023-11-26: qty 7, 7d supply, fill #0

## 2023-11-27 ENCOUNTER — Other Ambulatory Visit: Payer: Self-pay

## 2023-11-28 ENCOUNTER — Other Ambulatory Visit: Payer: Self-pay

## 2023-12-01 ENCOUNTER — Other Ambulatory Visit: Payer: Self-pay | Admitting: Family Medicine

## 2023-12-01 ENCOUNTER — Other Ambulatory Visit: Payer: Self-pay

## 2023-12-01 DIAGNOSIS — M961 Postlaminectomy syndrome, not elsewhere classified: Secondary | ICD-10-CM

## 2023-12-01 DIAGNOSIS — F1190 Opioid use, unspecified, uncomplicated: Secondary | ICD-10-CM

## 2023-12-02 ENCOUNTER — Other Ambulatory Visit: Payer: Self-pay

## 2023-12-02 ENCOUNTER — Other Ambulatory Visit: Payer: Self-pay | Admitting: Family Medicine

## 2023-12-02 MED ORDER — HYDROCODONE-ACETAMINOPHEN 10-325 MG PO TABS
1.0000 | ORAL_TABLET | Freq: Four times a day (QID) | ORAL | 0 refills | Status: DC | PRN
  Filled 2023-12-02 – 2023-12-04 (×3): qty 100, 25d supply, fill #0

## 2023-12-02 MED ORDER — CELECOXIB 100 MG PO CAPS
100.0000 mg | ORAL_CAPSULE | Freq: Two times a day (BID) | ORAL | 2 refills | Status: AC | PRN
  Filled 2023-12-02: qty 30, 15d supply, fill #0
  Filled 2024-04-24: qty 30, 15d supply, fill #1

## 2023-12-03 ENCOUNTER — Other Ambulatory Visit: Payer: Self-pay

## 2023-12-04 ENCOUNTER — Other Ambulatory Visit: Payer: Self-pay

## 2023-12-04 ENCOUNTER — Encounter: Payer: Self-pay | Admitting: Oncology

## 2023-12-08 ENCOUNTER — Other Ambulatory Visit: Payer: Self-pay

## 2023-12-09 ENCOUNTER — Other Ambulatory Visit: Payer: Self-pay

## 2023-12-10 ENCOUNTER — Other Ambulatory Visit: Payer: Self-pay

## 2023-12-10 MED ORDER — MIRTAZAPINE 15 MG PO TABS
15.0000 mg | ORAL_TABLET | Freq: Every day | ORAL | 0 refills | Status: DC
  Filled 2023-12-10 – 2023-12-24 (×3): qty 30, 30d supply, fill #0

## 2023-12-10 MED ORDER — ALPRAZOLAM ER 1 MG PO TB24
1.0000 mg | ORAL_TABLET | Freq: Every day | ORAL | 0 refills | Status: DC
  Filled 2023-12-10: qty 30, 30d supply, fill #0

## 2023-12-10 MED ORDER — ALPRAZOLAM 1 MG PO TABS
1.0000 mg | ORAL_TABLET | Freq: Three times a day (TID) | ORAL | 0 refills | Status: DC | PRN
  Filled 2023-12-10: qty 90, 30d supply, fill #0

## 2023-12-16 ENCOUNTER — Encounter (INDEPENDENT_AMBULATORY_CARE_PROVIDER_SITE_OTHER): Payer: Self-pay

## 2023-12-24 ENCOUNTER — Other Ambulatory Visit: Payer: Self-pay

## 2023-12-24 MED ORDER — LURASIDONE HCL 60 MG PO TABS
60.0000 mg | ORAL_TABLET | Freq: Every evening | ORAL | 1 refills | Status: DC
  Filled 2023-12-24: qty 30, 30d supply, fill #0

## 2023-12-24 MED ORDER — ALPRAZOLAM 1 MG PO TABS
1.0000 mg | ORAL_TABLET | Freq: Three times a day (TID) | ORAL | 1 refills | Status: DC | PRN
  Filled 2024-04-24 – 2024-04-27 (×3): qty 90, 30d supply, fill #0

## 2023-12-24 MED ORDER — ESCITALOPRAM OXALATE 20 MG PO TABS
20.0000 mg | ORAL_TABLET | Freq: Every day | ORAL | 0 refills | Status: DC
  Filled 2023-12-24: qty 90, 90d supply, fill #0

## 2023-12-24 MED ORDER — ALPRAZOLAM ER 1 MG PO TB24: 1.0000 mg | ORAL_TABLET | Freq: Every day | ORAL | 0 refills | Status: DC

## 2023-12-29 ENCOUNTER — Other Ambulatory Visit: Payer: Self-pay | Admitting: Family Medicine

## 2023-12-29 ENCOUNTER — Other Ambulatory Visit: Payer: Self-pay

## 2023-12-29 DIAGNOSIS — M961 Postlaminectomy syndrome, not elsewhere classified: Secondary | ICD-10-CM

## 2023-12-29 DIAGNOSIS — F1190 Opioid use, unspecified, uncomplicated: Secondary | ICD-10-CM

## 2023-12-29 MED ORDER — HYDROCODONE-ACETAMINOPHEN 10-325 MG PO TABS
1.0000 | ORAL_TABLET | Freq: Four times a day (QID) | ORAL | 0 refills | Status: DC | PRN
  Filled 2023-12-29: qty 100, 25d supply, fill #0

## 2024-01-06 ENCOUNTER — Other Ambulatory Visit: Payer: Self-pay

## 2024-01-06 MED ORDER — ALPRAZOLAM ER 1 MG PO TB24
1.0000 mg | ORAL_TABLET | Freq: Every day | ORAL | 0 refills | Status: DC
  Filled 2024-01-07 – 2024-01-08 (×2): qty 30, 30d supply, fill #0

## 2024-01-06 MED ORDER — ALPRAZOLAM 1 MG PO TABS
1.0000 mg | ORAL_TABLET | Freq: Three times a day (TID) | ORAL | 0 refills | Status: DC | PRN
  Filled 2024-01-07 – 2024-01-08 (×2): qty 90, 30d supply, fill #0

## 2024-01-07 ENCOUNTER — Other Ambulatory Visit: Payer: Self-pay

## 2024-01-08 ENCOUNTER — Other Ambulatory Visit: Payer: Self-pay

## 2024-01-19 ENCOUNTER — Other Ambulatory Visit: Payer: Self-pay

## 2024-01-19 MED ORDER — LURASIDONE HCL 60 MG PO TABS
60.0000 mg | ORAL_TABLET | Freq: Every evening | ORAL | 1 refills | Status: DC
  Filled 2024-01-19: qty 30, 30d supply, fill #0

## 2024-01-21 ENCOUNTER — Other Ambulatory Visit: Payer: Self-pay

## 2024-01-21 MED ORDER — ALPRAZOLAM 1 MG PO TABS
ORAL_TABLET | ORAL | 0 refills | Status: DC
  Filled 2024-02-03: qty 120, 30d supply, fill #0

## 2024-01-23 ENCOUNTER — Other Ambulatory Visit: Payer: Self-pay | Admitting: Family Medicine

## 2024-01-23 ENCOUNTER — Other Ambulatory Visit: Payer: Self-pay

## 2024-01-23 DIAGNOSIS — F1190 Opioid use, unspecified, uncomplicated: Secondary | ICD-10-CM

## 2024-01-23 DIAGNOSIS — M961 Postlaminectomy syndrome, not elsewhere classified: Secondary | ICD-10-CM

## 2024-01-25 ENCOUNTER — Other Ambulatory Visit: Payer: Self-pay

## 2024-01-25 MED FILL — Hydrocodone-Acetaminophen Tab 10-325 MG: ORAL | 25 days supply | Qty: 100 | Fill #0 | Status: AC

## 2024-01-26 ENCOUNTER — Encounter: Payer: Self-pay | Admitting: Oncology

## 2024-01-26 ENCOUNTER — Other Ambulatory Visit: Payer: Self-pay

## 2024-01-27 ENCOUNTER — Other Ambulatory Visit (HOSPITAL_COMMUNITY): Payer: Self-pay

## 2024-01-27 ENCOUNTER — Telehealth: Payer: Self-pay

## 2024-01-27 ENCOUNTER — Other Ambulatory Visit: Payer: Self-pay

## 2024-01-27 ENCOUNTER — Encounter: Payer: Self-pay | Admitting: Oncology

## 2024-01-27 NOTE — Telephone Encounter (Signed)
 Pharmacy Patient Advocate Encounter   Received notification from CoverMyMeds that prior authorization for Phentermine  HCl 15MG  capsules  is required/requested.   Insurance verification completed.   The patient is insured through [employer] [facility] .   Per test claim: PA required; PA submitted to above mentioned insurance via CoverMyMeds Key/confirmation #/EOC [facility] Status is pending

## 2024-01-28 NOTE — Telephone Encounter (Signed)
 Pharmacy Patient Advocate Encounter  Received notification from [employer] [facility] that Prior Authorization for Phentermine  HCl 15MG  capsules  has been DENIED.  See denial reason below. No denial letter attached in CMM. Will attach denial letter to Media tab once received.   PA #/Case ID/Reference #: [id number]

## 2024-02-02 ENCOUNTER — Other Ambulatory Visit: Payer: Self-pay

## 2024-02-03 ENCOUNTER — Other Ambulatory Visit: Payer: Self-pay

## 2024-02-15 ENCOUNTER — Other Ambulatory Visit: Payer: Self-pay | Admitting: Family Medicine

## 2024-02-15 DIAGNOSIS — F1190 Opioid use, unspecified, uncomplicated: Secondary | ICD-10-CM

## 2024-02-15 DIAGNOSIS — M961 Postlaminectomy syndrome, not elsewhere classified: Secondary | ICD-10-CM

## 2024-02-16 ENCOUNTER — Other Ambulatory Visit: Payer: Self-pay

## 2024-02-16 ENCOUNTER — Other Ambulatory Visit: Payer: Self-pay | Admitting: Family Medicine

## 2024-02-16 DIAGNOSIS — M961 Postlaminectomy syndrome, not elsewhere classified: Secondary | ICD-10-CM

## 2024-02-16 DIAGNOSIS — F1190 Opioid use, unspecified, uncomplicated: Secondary | ICD-10-CM

## 2024-02-16 MED ORDER — ESCITALOPRAM OXALATE 20 MG PO TABS
20.0000 mg | ORAL_TABLET | Freq: Every day | ORAL | 0 refills | Status: DC
  Filled 2024-02-16: qty 90, 90d supply, fill #0

## 2024-02-16 MED ORDER — ALPRAZOLAM 1 MG PO TABS
ORAL_TABLET | ORAL | 1 refills | Status: DC
  Filled 2024-03-02: qty 120, 30d supply, fill #0
  Filled 2024-03-29: qty 120, 30d supply, fill #1

## 2024-02-16 MED ORDER — LURASIDONE HCL 60 MG PO TABS
60.0000 mg | ORAL_TABLET | Freq: Every evening | ORAL | 1 refills | Status: DC
  Filled 2024-02-16: qty 30, 30d supply, fill #0

## 2024-02-16 MED ORDER — ARMODAFINIL 150 MG PO TABS
150.0000 mg | ORAL_TABLET | Freq: Every morning | ORAL | 2 refills | Status: DC
  Filled 2024-02-16: qty 30, 30d supply, fill #0

## 2024-02-17 ENCOUNTER — Other Ambulatory Visit: Payer: Self-pay

## 2024-02-17 MED FILL — Hydrocodone-Acetaminophen Tab 10-325 MG: ORAL | 25 days supply | Qty: 100 | Fill #0 | Status: CN

## 2024-02-18 ENCOUNTER — Other Ambulatory Visit: Payer: Self-pay

## 2024-02-18 MED FILL — Hydrocodone-Acetaminophen Tab 10-325 MG: ORAL | 25 days supply | Qty: 100 | Fill #0 | Status: CN

## 2024-02-19 ENCOUNTER — Other Ambulatory Visit: Payer: Self-pay

## 2024-02-19 MED FILL — Hydrocodone-Acetaminophen Tab 10-325 MG: ORAL | 25 days supply | Qty: 100 | Fill #0 | Status: AC

## 2024-02-25 ENCOUNTER — Encounter: Payer: Self-pay | Admitting: Oncology

## 2024-02-25 ENCOUNTER — Other Ambulatory Visit: Payer: Self-pay

## 2024-03-01 ENCOUNTER — Other Ambulatory Visit: Payer: Self-pay

## 2024-03-02 ENCOUNTER — Other Ambulatory Visit: Payer: Self-pay

## 2024-03-10 ENCOUNTER — Other Ambulatory Visit: Payer: Self-pay | Admitting: Family Medicine

## 2024-03-10 DIAGNOSIS — F1190 Opioid use, unspecified, uncomplicated: Secondary | ICD-10-CM

## 2024-03-10 DIAGNOSIS — M961 Postlaminectomy syndrome, not elsewhere classified: Secondary | ICD-10-CM

## 2024-03-11 ENCOUNTER — Other Ambulatory Visit: Payer: Self-pay | Admitting: Family Medicine

## 2024-03-11 ENCOUNTER — Other Ambulatory Visit: Payer: Self-pay

## 2024-03-11 DIAGNOSIS — M961 Postlaminectomy syndrome, not elsewhere classified: Secondary | ICD-10-CM

## 2024-03-11 DIAGNOSIS — F1190 Opioid use, unspecified, uncomplicated: Secondary | ICD-10-CM

## 2024-03-12 ENCOUNTER — Other Ambulatory Visit: Payer: Self-pay | Admitting: Family Medicine

## 2024-03-12 ENCOUNTER — Other Ambulatory Visit: Payer: Self-pay

## 2024-03-12 MED FILL — Hydrocodone-Acetaminophen Tab 10-325 MG: ORAL | 25 days supply | Qty: 100 | Fill #0 | Status: CN

## 2024-03-15 ENCOUNTER — Other Ambulatory Visit: Payer: Self-pay

## 2024-03-15 MED FILL — Hydrocodone-Acetaminophen Tab 10-325 MG: ORAL | 25 days supply | Qty: 100 | Fill #0 | Status: AC

## 2024-03-22 ENCOUNTER — Other Ambulatory Visit: Payer: Self-pay

## 2024-03-22 MED ORDER — LURASIDONE HCL 60 MG PO TABS
60.0000 mg | ORAL_TABLET | Freq: Every evening | ORAL | 1 refills | Status: DC
  Filled 2024-03-22: qty 30, 30d supply, fill #0

## 2024-03-22 MED ORDER — ESCITALOPRAM OXALATE 20 MG PO TABS
20.0000 mg | ORAL_TABLET | Freq: Every day | ORAL | 0 refills | Status: DC
  Filled 2024-03-22: qty 90, 90d supply, fill #0

## 2024-03-26 ENCOUNTER — Encounter: Payer: Self-pay | Admitting: Oncology

## 2024-03-26 ENCOUNTER — Other Ambulatory Visit: Payer: Self-pay

## 2024-03-27 ENCOUNTER — Other Ambulatory Visit: Payer: Self-pay

## 2024-03-29 ENCOUNTER — Other Ambulatory Visit: Payer: Self-pay

## 2024-03-30 ENCOUNTER — Other Ambulatory Visit: Payer: Self-pay

## 2024-03-31 ENCOUNTER — Encounter: Payer: Self-pay | Admitting: Psychiatry

## 2024-03-31 ENCOUNTER — Ambulatory Visit (INDEPENDENT_AMBULATORY_CARE_PROVIDER_SITE_OTHER): Admitting: Psychiatry

## 2024-03-31 ENCOUNTER — Encounter: Payer: Self-pay | Admitting: Oncology

## 2024-03-31 DIAGNOSIS — F411 Generalized anxiety disorder: Secondary | ICD-10-CM
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
## 2024-04-02 DIAGNOSIS — F410 Panic disorder [episodic paroxysmal anxiety] without agoraphobia: Secondary | ICD-10-CM | POA: Diagnosis not present

## 2024-04-02 DIAGNOSIS — F3164 Bipolar disorder, current episode mixed, severe, with psychotic features: Secondary | ICD-10-CM | POA: Diagnosis not present

## 2024-04-02 DIAGNOSIS — G4700 Insomnia, unspecified: Secondary | ICD-10-CM | POA: Diagnosis not present

## 2024-04-02 DIAGNOSIS — F4310 Post-traumatic stress disorder, unspecified: Secondary | ICD-10-CM | POA: Diagnosis not present

## 2024-04-02 DIAGNOSIS — F411 Generalized anxiety disorder: Secondary | ICD-10-CM | POA: Diagnosis not present

## 2024-04-08 ENCOUNTER — Other Ambulatory Visit: Payer: Self-pay

## 2024-04-08 ENCOUNTER — Other Ambulatory Visit: Payer: Self-pay | Admitting: Family Medicine

## 2024-04-08 DIAGNOSIS — M961 Postlaminectomy syndrome, not elsewhere classified: Secondary | ICD-10-CM

## 2024-04-08 DIAGNOSIS — F1190 Opioid use, unspecified, uncomplicated: Secondary | ICD-10-CM

## 2024-04-08 MED ORDER — HYDROCODONE-ACETAMINOPHEN 10-325 MG PO TABS
1.0000 | ORAL_TABLET | Freq: Four times a day (QID) | ORAL | 0 refills | Status: DC | PRN
  Filled 2024-04-08: qty 100, 25d supply, fill #0

## 2024-04-09 DIAGNOSIS — G4700 Insomnia, unspecified: Secondary | ICD-10-CM | POA: Diagnosis not present

## 2024-04-09 DIAGNOSIS — F4310 Post-traumatic stress disorder, unspecified: Secondary | ICD-10-CM | POA: Diagnosis not present

## 2024-04-09 DIAGNOSIS — F410 Panic disorder [episodic paroxysmal anxiety] without agoraphobia: Secondary | ICD-10-CM | POA: Diagnosis not present

## 2024-04-09 DIAGNOSIS — F3164 Bipolar disorder, current episode mixed, severe, with psychotic features: Secondary | ICD-10-CM | POA: Diagnosis not present

## 2024-04-09 DIAGNOSIS — F411 Generalized anxiety disorder: Secondary | ICD-10-CM | POA: Diagnosis not present

## 2024-04-12 ENCOUNTER — Telehealth: Payer: Self-pay | Admitting: Psychiatry

## 2024-04-12 ENCOUNTER — Ambulatory Visit (INDEPENDENT_AMBULATORY_CARE_PROVIDER_SITE_OTHER): Admitting: Psychiatry

## 2024-04-12 DIAGNOSIS — F411 Generalized anxiety disorder: Secondary | ICD-10-CM

## 2024-04-12 NOTE — Telephone Encounter (Signed)
       [name], sakamoto are scheduled for a virtual visit with your provider today.    Just as we do with appointments in the office, we must obtain your consent to participate.  Your consent will be active for this visit and any virtual visit you may have with one of our providers in the next 365 days.    If you have a MyChart account, I can also send a copy of this consent to you electronically.  All virtual visits are billed to your insurance company just like a traditional visit in the office.  As this is a virtual visit, video technology does not allow for your provider to perform a traditional examination.  This may limit your provider's ability to fully assess your condition.  If your provider identifies any concerns that need to be evaluated in person or the need to arrange testing such as labs, EKG, etc, we will make arrangements to do so.    Although advances in technology are sophisticated, we cannot ensure that it will always work on either your end or our end.  If the connection with a video visit is poor, we may have to switch to a telephone visit.  With either a video or telephone visit, we are not always able to ensure that we have a secure connection.   I need to obtain your verbal consent now.   Are you willing to proceed with your visit today?   Michaela Jones has provided verbal consent on 04/12/2024 for a virtual visit (video or telephone).   Silvano Pacini, [facility] 04/12/2024  12:43 PM
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
## 2024-04-16 DIAGNOSIS — G4700 Insomnia, unspecified: Secondary | ICD-10-CM | POA: Diagnosis not present

## 2024-04-16 DIAGNOSIS — F411 Generalized anxiety disorder: Secondary | ICD-10-CM | POA: Diagnosis not present

## 2024-04-16 DIAGNOSIS — F39 Unspecified mood [affective] disorder: Secondary | ICD-10-CM | POA: Diagnosis not present

## 2024-04-16 DIAGNOSIS — F4310 Post-traumatic stress disorder, unspecified: Secondary | ICD-10-CM | POA: Diagnosis not present

## 2024-04-16 DIAGNOSIS — F410 Panic disorder [episodic paroxysmal anxiety] without agoraphobia: Secondary | ICD-10-CM | POA: Diagnosis not present

## 2024-04-19 ENCOUNTER — Ambulatory Visit (INDEPENDENT_AMBULATORY_CARE_PROVIDER_SITE_OTHER): Admitting: Psychiatry

## 2024-04-19 DIAGNOSIS — F411 Generalized anxiety disorder: Secondary | ICD-10-CM | POA: Diagnosis not present

## 2024-04-19 NOTE — Progress Notes (Signed)
 [facility] Counselor/Therapist Progress Note  Patient ID: Michaela Jones, MRN: [id number],    Date: 04/19/2024  Time Spent: 53 minutes start time 3:00 PM end time 3:53 PM Virtual Visit via Video Note Connected with patient by a telemedicine/telehealth application, with their informed consent, and verified patient privacy and that I am speaking with the correct person using two identifiers. I discussed the limitations, risks, security and privacy concerns of performing psychotherapy and the availability of in person appointments. I also discussed with the patient that there may be a patient responsible charge related to this service. The patient expressed understanding and agreed to proceed. I discussed the treatment planning with the patient. The patient was provided an opportunity to ask questions and all were answered. The patient agreed with the plan and demonstrated an understanding of the instructions. The patient was advised to call  our office if  symptoms worsen or feel they are in a crisis state and need immediate contact.   Therapist Location: home Patient Location: home    Treatment Type: Individual Therapy  Reported Symptoms: anxiety, sadness, triggered responses, fatigue, rumination. Low motivation, poor hygiene, hallucinations  Mental Status Exam:  Appearance:   Casual and haven't showered in a week     Behavior:  Appropriate  Motor:  Normal  Speech/Language:   Pressured  Affect:  Appropriate and Tearful  Mood:  anxious and depressed  Thought process:  circumstantial  Thought content:    WNL  Sensory/Perceptual disturbances:    rumination  Orientation:  oriented to person, place, time/date, and situation  Attention:  fair  Concentration:  fair  Memory:  Immediate;   Fair  [employer] of knowledge:   Fair  Insight:    Fair  Judgment:   Good  Impulse Control:  Good   Risk Assessment: Danger to Self:  No Self-injurious Behavior: No Danger to Others: No Duty to  Warn:no Physical Aggression / Violence:No  Access to Firearms a concern: No  Gang Involvement:No   Subjective: Met with patient via virtual session. She shared she was having a reaction to the medication again so she is off of it again.  Patient stated that weaning herself off the medications has been very difficult.  She did share she was safe at this time.  Agreed to continue working with her provider on the issue.  Patient went on to share that she was not functioning and wanted to get back to doing the things that she knows she needs to do for herself and her family.  She shared she is thinking about trying to get back to work and her husband wants her to because he needs some relief as well but she cannot seem to get things together enough to even do that.  Patient stated she had not taken a shower in the week.  Patient developed treatment plan and set goals in session.  Had patient do breathing exercises and tried to get her to think through what were facts and truce in her different situation.  Discussed the importance of staying focused on what is true.  Patient was also encouraged to get outside and get some vitamin D .  Discussed even if she could not take a shower she could at least sit in the bathtub and try and reenergize some on her own.  Patient was reminded of DBT skills discussed in previous sessions.  Interventions: Dialectical Behavioral Therapy and Solution-Oriented/Positive Psychology  Diagnosis:   ICD-10-CM   1. Generalized anxiety disorder  F41.1       Plan: Patient is to practice DBT, CBT and coping skills to decrease her anxiety. Patient is to work on practicing breathing techniques and work on getting her brother-in-law and nephew out of her house. Patient is to see her provider Harlene Pepper, PMHNP for medication concerns. She is to see other providers for other medical issues.     Silvano Pacini, [facility]

## 2024-04-24 ENCOUNTER — Other Ambulatory Visit: Payer: Self-pay | Admitting: Family Medicine

## 2024-04-24 ENCOUNTER — Other Ambulatory Visit: Payer: Self-pay

## 2024-04-24 DIAGNOSIS — E039 Hypothyroidism, unspecified: Secondary | ICD-10-CM

## 2024-04-24 DIAGNOSIS — F1190 Opioid use, unspecified, uncomplicated: Secondary | ICD-10-CM

## 2024-04-24 DIAGNOSIS — M961 Postlaminectomy syndrome, not elsewhere classified: Secondary | ICD-10-CM

## 2024-04-25 ENCOUNTER — Other Ambulatory Visit: Payer: Self-pay

## 2024-04-25 MED ORDER — LEVOTHYROXINE SODIUM 150 MCG PO TABS
150.0000 ug | ORAL_TABLET | Freq: Every day | ORAL | 0 refills | Status: AC
Start: 2024-04-25 — End: ?
  Filled 2024-04-25: qty 30, 30d supply, fill #0

## 2024-04-25 MED ORDER — HYDROCODONE-ACETAMINOPHEN 10-325 MG PO TABS
1.0000 | ORAL_TABLET | Freq: Four times a day (QID) | ORAL | 0 refills | Status: DC | PRN
  Filled 2024-04-25 – 2024-05-02 (×4): qty 100, 25d supply, fill #0

## 2024-04-26 ENCOUNTER — Other Ambulatory Visit: Payer: Self-pay

## 2024-04-26 ENCOUNTER — Encounter: Payer: Self-pay | Admitting: Oncology

## 2024-04-26 ENCOUNTER — Ambulatory Visit: Admitting: Psychiatry

## 2024-04-26 DIAGNOSIS — F411 Generalized anxiety disorder: Secondary | ICD-10-CM
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
## 2024-04-27 ENCOUNTER — Other Ambulatory Visit: Payer: Self-pay

## 2024-04-28 ENCOUNTER — Other Ambulatory Visit: Payer: Self-pay

## 2024-04-28 ENCOUNTER — Other Ambulatory Visit: Payer: Self-pay | Admitting: Family Medicine

## 2024-04-28 DIAGNOSIS — J309 Allergic rhinitis, unspecified: Secondary | ICD-10-CM

## 2024-04-28 MED ORDER — AZELASTINE HCL 0.1 % NA SOLN
2.0000 | Freq: Two times a day (BID) | NASAL | 5 refills | Status: AC
  Filled 2024-04-28: qty 30, 50d supply, fill #0

## 2024-04-28 MED ORDER — FLUTICASONE PROPIONATE 50 MCG/ACT NA SUSP
2.0000 | Freq: Every day | NASAL | 5 refills | Status: AC
  Filled 2024-04-28: qty 16, 30d supply, fill #0

## 2024-05-02 ENCOUNTER — Other Ambulatory Visit: Payer: Self-pay

## 2024-05-03 ENCOUNTER — Ambulatory Visit: Admitting: Psychiatry

## 2024-05-10 ENCOUNTER — Ambulatory Visit: Admitting: Psychiatry

## 2024-05-10 DIAGNOSIS — F411 Generalized anxiety disorder: Secondary | ICD-10-CM | POA: Diagnosis not present

## 2024-05-10 NOTE — Progress Notes (Unsigned)
 [facility] Counselor/Therapist Progress Note  Patient ID: Michaela Jones, MRN: [id number],    Date: 05/10/2024  Time Spent: 50 minutes start time 3:03 PM end time 3:53 PM Virtual Visit via Video Note Connected with patient by a telemedicine/telehealth application, with their informed consent, and verified patient privacy and that I am speaking with the correct person using two identifiers. I discussed the limitations, risks, security and privacy concerns of performing psychotherapy and the availability of in person appointments. I also discussed with the patient that there may be a patient responsible charge related to this service. The patient expressed understanding and agreed to proceed. I discussed the treatment planning with the patient. The patient was provided an opportunity to ask questions and all were answered. The patient agreed with the plan and demonstrated an understanding of the instructions. The patient was advised to call  our office if  symptoms worsen or feel they are in a crisis state and need immediate contact.   Therapist Location: home Patient Location: home    Treatment Type: Individual Therapy  Reported Symptoms: anxiety, sadness, triggered responses, crying spells, racing thoughts, panic, memory issues  Mental Status Exam:  Appearance:   Casual     Behavior:  Appropriate  Motor:  Normal  Speech/Language:   Normal Rate  Affect:  Appropriate  Mood:  anxious  Thought process:  circumstantial  Thought content:    WNL  Sensory/Perceptual disturbances:    WNL  Orientation:  oriented to person, place, time/date, and situation  Attention:  Good  Concentration:  Good  Memory:  Fair  Fund of knowledge:   Good  Insight:    Good  Judgment:   Good  Impulse Control:  Good   Risk Assessment: Danger to Self:  No Self-injurious Behavior: No Danger to Others: No Duty to Warn:no Physical Aggression / Violence:No  Access to Firearms a concern: No  Gang  Involvement:No   Subjective: Met with patient via virtual session.  She shared that they went to [facility] due to having to get her father in law's dog. She went on to share that it was a stressful trip due to her step mother in law stating that she wanted to kill herself after they took the dog. She also said she wanted her to go into a nursing home. She shared she is decreasing her Xanax  and working with her provider on medication. She shared she was having panic due to her brother in law not moving everything to his new house. Discussed importance of telling him that he is moving out not giving him an option. She shared that the breathing exercises from last session have helped her. She shared that the talk of suicide was very upsetting to her. She was able to ground herself which was good. She was also able to focus on the facts. Discussed how important that is and the need to focus on the truth. She shared her other negative thing that happened last week was an interaction with her cousin that was difficult.  Patient discussed the situation she was encouraged to use her tools to remind herself of what the facts are.  Patient stated she has been trying to do her DBT skills and has found them helpful when she uses them.  Encouraged patient to recognize that the biggest thing creating the anxiety is that her brother-in-law is still living with her and that they have to focus on just getting him out of the house since a  lot of her anxiety is situational due to having her brother-in-law living there and not being able to set appropriate limits with his child.  Patient agreed to work on trying to get him out of their home.  Interventions: Solution-Oriented/Positive Psychology and Insight-Oriented  Diagnosis:   ICD-10-CM   1. Generalized anxiety disorder  F41.1       Plan: Patient is to practice DBT, CBT and coping skills to decrease her anxiety.  Patient is to practice visuals from session to help be able to  set boundaries and not take on other peoples stuff.  Patient is to work on practicing breathing techniques and work on getting her brother-in-law and nephew out of her house. Patient is to see her provider Harlene Pepper, PMHNP for medication concerns. She is to see other providers for other medical issues.   Silvano Pacini, [facility]

## 2024-05-13 ENCOUNTER — Other Ambulatory Visit: Payer: Self-pay

## 2024-05-13 DIAGNOSIS — F410 Panic disorder [episodic paroxysmal anxiety] without agoraphobia: Secondary | ICD-10-CM | POA: Diagnosis not present

## 2024-05-13 DIAGNOSIS — F411 Generalized anxiety disorder: Secondary | ICD-10-CM | POA: Diagnosis not present

## 2024-05-13 DIAGNOSIS — F39 Unspecified mood [affective] disorder: Secondary | ICD-10-CM | POA: Diagnosis not present

## 2024-05-13 DIAGNOSIS — F4310 Post-traumatic stress disorder, unspecified: Secondary | ICD-10-CM | POA: Diagnosis not present

## 2024-05-13 DIAGNOSIS — G4700 Insomnia, unspecified: Secondary | ICD-10-CM | POA: Diagnosis not present

## 2024-05-13 MED ORDER — ALPRAZOLAM 1 MG PO TABS
4.0000 mg | ORAL_TABLET | Freq: Every day | ORAL | 0 refills | Status: AC
  Filled 2024-05-15 – 2024-05-26 (×5): qty 120, 30d supply, fill #0

## 2024-05-15 ENCOUNTER — Other Ambulatory Visit: Payer: Self-pay

## 2024-05-16 ENCOUNTER — Other Ambulatory Visit: Payer: Self-pay

## 2024-05-17 ENCOUNTER — Other Ambulatory Visit: Payer: Self-pay

## 2024-05-17 ENCOUNTER — Ambulatory Visit: Admitting: Psychiatry

## 2024-05-17 DIAGNOSIS — F411 Generalized anxiety disorder: Secondary | ICD-10-CM
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
## 2024-05-20 ENCOUNTER — Other Ambulatory Visit: Payer: Self-pay | Admitting: Family Medicine

## 2024-05-20 DIAGNOSIS — F1190 Opioid use, unspecified, uncomplicated: Secondary | ICD-10-CM

## 2024-05-20 DIAGNOSIS — M961 Postlaminectomy syndrome, not elsewhere classified: Secondary | ICD-10-CM

## 2024-05-21 ENCOUNTER — Other Ambulatory Visit: Payer: Self-pay

## 2024-05-21 MED ORDER — HYDROCODONE-ACETAMINOPHEN 10-325 MG PO TABS
1.0000 | ORAL_TABLET | Freq: Four times a day (QID) | ORAL | 0 refills | Status: DC | PRN
  Filled 2024-05-21 – 2024-05-26 (×3): qty 100, 25d supply, fill #0

## 2024-05-22 ENCOUNTER — Other Ambulatory Visit: Payer: Self-pay

## 2024-05-24 ENCOUNTER — Ambulatory Visit (INDEPENDENT_AMBULATORY_CARE_PROVIDER_SITE_OTHER): Admitting: Psychiatry

## 2024-05-24 DIAGNOSIS — F411 Generalized anxiety disorder: Secondary | ICD-10-CM
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
## 2024-05-26 ENCOUNTER — Other Ambulatory Visit: Payer: Self-pay

## 2024-05-31 ENCOUNTER — Ambulatory Visit (INDEPENDENT_AMBULATORY_CARE_PROVIDER_SITE_OTHER): Payer: Self-pay | Admitting: Psychiatry

## 2024-05-31 DIAGNOSIS — F411 Generalized anxiety disorder: Secondary | ICD-10-CM

## 2024-05-31 NOTE — Progress Notes (Signed)
 Patient did not connect for session. Sent link and left message stayed on line until 3:21 PM

## 2024-06-01 ENCOUNTER — Other Ambulatory Visit: Payer: Self-pay

## 2024-06-01 ENCOUNTER — Encounter: Payer: Self-pay | Admitting: Oncology

## 2024-06-01 ENCOUNTER — Telehealth: Payer: Self-pay | Admitting: Family Medicine

## 2024-06-01 ENCOUNTER — Ambulatory Visit: Payer: Self-pay | Admitting: Family Medicine

## 2024-06-01 ENCOUNTER — Encounter: Payer: Self-pay | Admitting: Family Medicine

## 2024-06-01 DIAGNOSIS — R051 Acute cough: Secondary | ICD-10-CM

## 2024-06-01 DIAGNOSIS — J309 Allergic rhinitis, unspecified: Secondary | ICD-10-CM

## 2024-06-01 DIAGNOSIS — J4521 Mild intermittent asthma with (acute) exacerbation: Secondary | ICD-10-CM

## 2024-06-01 MED ORDER — ALBUTEROL SULFATE HFA 108 (90 BASE) MCG/ACT IN AERS
2.0000 | INHALATION_SPRAY | Freq: Four times a day (QID) | RESPIRATORY_TRACT | 2 refills | Status: AC | PRN
  Filled 2024-06-01: qty 6.7, 25d supply, fill #0

## 2024-06-01 MED ORDER — HYDROCOD POLI-CHLORPHE POLI ER 10-8 MG/5ML PO SUER
5.0000 mL | Freq: Two times a day (BID) | ORAL | 0 refills | Status: AC | PRN
  Filled 2024-06-01: qty 70, 7d supply, fill #0

## 2024-06-01 MED ORDER — PREDNISONE 10 MG PO TABS
ORAL_TABLET | ORAL | 0 refills | Status: AC
  Filled 2024-06-01: qty 21, 6d supply, fill #0

## 2024-06-01 NOTE — Progress Notes (Deleted)
   Acute Office Visit  Introduced to nurse practitioner role and practice setting.  All questions answered.  Discussed provider/patient relationship and expectations.   Subjective:     Patient ID: Michaela Jones, female    DOB: 02/17/1976, 48 y.o.   MRN: [id number]  No chief complaint on file.   Discussed the use of AI scribe software for clinical note transcription with the patient, who gave verbal consent to proceed.  History of Present Illness     HPI  ROS      Objective:    There were no vitals taken for this visit. {Vitals History (Optional):23777}  Physical Exam  No results found for any visits on 06/01/24.      Assessment & Plan:  Assessment and Plan Assessment & Plan      Problem List Items Addressed This Visit   None   No orders of the defined types were placed in this encounter.   No follow-ups on file.  Curtis DELENA Boom, FNP  I, Curtis DELENA Boom, FNP, have reviewed all documentation for this visit. The documentation on 06/01/24 for the exam, diagnosis, procedures, and orders are all accurate and complete.

## 2024-06-01 NOTE — Progress Notes (Signed)
 Virtual Visit via Video Note  I connected with Michaela Jones on 06/01/24 at 10:00 AM EST by a video enabled telemedicine application and verified that I am speaking with the correct person using two identifiers.  Patient Location: Home Provider Location: Office/Clinic  I discussed the limitations, risks, security, and privacy concerns of performing an evaluation and management service by video and the availability of in person appointments. I also discussed with the patient that there may be a patient responsible charge related to this service. The patient expressed understanding and agreed to proceed.  Subjective: PCP: Michaela Nancyann BRAVO, MD  Chief Complaint  Patient presents with   Cough   HPI  Discussed the use of AI scribe software for clinical note transcription with the patient, who gave verbal consent to proceed.  History of Present Illness Michaela Jones is a 48 year old female who presents with symptoms of a severe cold and respiratory issues.  Symptoms began around Friday after likely contracting a cold from her nephew and children. Initially, she experienced fatigue and nasal congestion that varied with her position. By Saturday, she developed lethargy and chest tightness, using her rescue inhaler for relief. She has been coughing up mucus and has tried over-the-counter medications, including Delsym  and Robitussin DM, without significant relief.  She has a history of using a rescue inhaler for chest tightness, which is nearing expiration. She also has a small prescription of cough medication with codeine  from last year, which she used recently without much improvement. Her symptoms include nasal discharge, post-nasal drip causing stomach upset, and a scratchy throat. No symptoms of strep throat, but she notes drainage into her stomach.  She reports a pattern of similar symptoms occurring in the fall and winter, including headaches, body aches, fluctuating temperature,  and a scratchy throat. In the past, Tussionex and antibiotics have been effective in resolving her symptoms. She avoids using norce for her back pain while on cough med.  She is currently taking Singulair  and Zyrtec  for allergies and has been prescribed Flonase  and azelastine  nasal sprays. She reports that there is a new long-haired dog in the household and wonders if her symptoms could be related to allergies, as she has a history of allergies to cats and mold. She has been allergy tested in the past and is aware of her allergies to cats and mold.  Endorses - She is due for blood work for her thyroid . She has been off antidepressants and antipsychotics, focusing on minimizing medication use. She is concerned about her symptoms affecting her new job, as she has been coughing excessively, impacting her sleep and causing her husband to sleep in another room.   ROS: Per HPI  Current Outpatient Medications:    albuterol  (VENTOLIN  HFA) 108 (90 Base) MCG/ACT inhaler, Inhale 2 puffs into the lungs every 6 (six) hours as needed for wheezing or shortness of breath., Disp: 6.7 g, Rfl: 2   chlorpheniramine-HYDROcodone  (TUSSIONEX) 10-8 MG/5ML, Take 5 mLs by mouth every 12 (twelve) hours as needed for up to 7 days for cough., Disp: 70 mL, Rfl: 0   predniSONE  (DELTASONE ) 10 MG tablet, Take 6 tablets (60 mg total) by mouth daily for 1 day, THEN 5 tablets (50 mg total) daily for 1 day, THEN 4 tablets (40 mg total) daily for 1 day, THEN 3 tablets (30 mg total) daily for 1 day, THEN 2 tablets (20 mg total) daily for 1 day, THEN 1 tablet (10 mg total) daily for 1  day., Disp: 21 tablet, Rfl: 0   albuterol  (VENTOLIN  HFA) 108 (90 Base) MCG/ACT inhaler, Inhale 2 puffs into the lungs every 6 (six) hours as needed for wheezing or shortness of breath., Disp: 6.7 g, Rfl: 1   ALPRAZolam  (XANAX ) 1 MG tablet, Take 1 tablet (1 mg total) by mouth 3 (three) times daily as needed for anxiety, Disp: 90 tablet, Rfl: 1   ALPRAZolam   (XANAX ) 1 MG tablet, Take 2 tablets (2 mg total) by mouth at bedtime and take 1 tablet (1 mg total) up to 2 times daily as needed., Disp: 120 tablet, Rfl: 0   apixaban  (ELIQUIS ) 5 MG TABS tablet, Take 1 tablet (5 mg total) by mouth 2 (two) times daily., Disp: 60 tablet, Rfl: 11   Armodafinil  150 MG tablet, Take 1 tablet (150 mg total) by mouth in the morning., Disp: 30 tablet, Rfl: 2   azelastine  (ASTELIN ) 0.1 % nasal spray, Place 2 sprays into both nostrils 2 (two) times daily. Use in each nostril as directed, Disp: 30 mL, Rfl: 5   celecoxib  (CELEBREX ) 100 MG capsule, Take 1 capsule (100 mg total) by mouth 2 (two) times daily as needed., Disp: 30 capsule, Rfl: 2   cetirizine  (ZYRTEC ) 10 MG tablet, Take 1 tablet (10 mg total) by mouth daily as needed for allergies., Disp: 90 tablet, Rfl: 2   cholecalciferol  (VITAMIN D3) 25 MCG (1000 UNIT) tablet, Take 1 tablet (1,000 Units total) by mouth daily., Disp: 90 tablet, Rfl: 4   cyanocobalamin  (VITAMIN B12) 1000 MCG/ML injection, Inject 1 mL (1,000 mcg total) into the skin every 14 (fourteen) days (2 weeks) for 3 months then 1ml once a month, Disp: 6 mL, Rfl: 4   escitalopram  (LEXAPRO ) 20 MG tablet, Take 1 tablet (20 mg total) by mouth daily., Disp: 90 tablet, Rfl: 0   fluticasone  (FLONASE ) 50 MCG/ACT nasal spray, Place 2 sprays into both nostrils daily., Disp: 16 g, Rfl: 5   HYDROcodone -acetaminophen  (NORCO) 10-325 MG tablet, Take 1 tablet by mouth 4 (four) times daily as needed., Disp: 100 tablet, Rfl: 0   levothyroxine  (SYNTHROID ) 150 MCG tablet, Take 1 tablet (150 mcg total) by mouth daily. PATIENT NEEDS TO SCHEDULE OFFICE VISIT FOR FOLLOW UP, Disp: 30 tablet, Rfl: 0   Lurasidone  HCl 60 MG TABS, Take 1 tablet (60 mg total) by mouth every evening., Disp: 30 tablet, Rfl: 1   montelukast  (SINGULAIR ) 10 MG tablet, Take 1 tablet (10 mg total) by mouth at bedtime., Disp: 90 tablet, Rfl: 2   Rimegepant Sulfate  (NURTEC) 75 MG TBDP, Take 1 tablet (75 mg total) by  mouth every other day., Disp: 20 tablet, Rfl: 3   promethazine  (PHENERGAN ) 25 MG tablet, Take 1 tablet (25 mg total) by mouth every 8 (eight) hours as needed for nausea or vomiting., Disp: 30 tablet, Rfl: 2  Observations/Objective: There were no vitals filed for this visit. Physical Exam Constitutional:      General: She is not in acute distress.    Appearance: Normal appearance. She is normal weight. She is not ill-appearing, toxic-appearing or diaphoretic.  Eyes:     Extraocular Movements: Extraocular movements intact.     Pupils: Pupils are equal, round, and reactive to light.  Pulmonary:     Effort: Pulmonary effort is normal.     Comments: Coughing throughout virtual visit. No audible wheezing heard or distressed visualized Musculoskeletal:        General: Normal range of motion.     Cervical back: Normal range of motion.  Neurological:     General: No focal deficit present.     Mental Status: She is alert and oriented to person, place, and time. Mental status is at baseline.     GCS: GCS eye subscore is 4. GCS verbal subscore is 5. GCS motor subscore is 6.     Cranial Nerves: No cranial nerve deficit.     Gait: Gait normal.  Psychiatric:        Attention and Perception: Perception normal. She is inattentive.        Mood and Affect: Mood and affect normal.        Speech: Speech is rapid and pressured and tangential.        Behavior: Behavior normal. Behavior is cooperative.        Thought Content: Thought content normal.        Cognition and Memory: Cognition and memory normal.        Judgment: Judgment normal.     Assessment and Plan: Acute cough -     Albuterol  Sulfate HFA; Inhale 2 puffs into the lungs every 6 (six) hours as needed for wheezing or shortness of breath.  Dispense: 6.7 g; Refill: 2 -     predniSONE ; Take 6 tablets (60 mg total) by mouth daily for 1 day, THEN 5 tablets (50 mg total) daily for 1 day, THEN 4 tablets (40 mg total) daily for 1 day, THEN 3  tablets (30 mg total) daily for 1 day, THEN 2 tablets (20 mg total) daily for 1 day, THEN 1 tablet (10 mg total) daily for 1 day.  Dispense: 21 tablet; Refill: 0 -     Hydrocod Poli-Chlorphe Poli ER; Take 5 mLs by mouth every 12 (twelve) hours as needed for up to 7 days for cough.  Dispense: 70 mL; Refill: 0  Mild intermittent asthma with acute exacerbation  Allergic rhinitis, unspecified seasonality, unspecified trigger  Assessment and Plan Assessment & Plan Acute upper respiratory infection with cough Symptoms began approximately five days ago, including nasal congestion, post-nasal drip, and cough. Symptoms are consistent with a viral upper respiratory infection. No indication for antibiotics at this time as symptoms given onset. Virtual visit, able to test patient for flu or covid.  Consideration of potential allergic component due to recent exposure to a long-haired dog. - Refilled albuterol  inhaler for cough management. - Prescribed a tapered steroid to reduce inflammation. - pt has tried robitussin, delsym , mucinex  - cough if keeping patient up at night and worse when she lays flat, causing throat discomfort. Has used Tussinex in past for severe  cough which helped. Pt is requesting as has tried several various OTC medications.  PDMP reviewed - okay to give as long as pt stops xanax  and holds use of norce medication for back. Pt is agreeable.  - cough medication only to be used for severe cough w/ pain - continue use of flonase  and azelastin for congestion - continue mucinex  for losing secretions - Recommended symptomatic management with hot tea with honey and salt water gargles. - Advised to monitor symptoms, if persists for longer than one week, may consider use of antibiotics.   Mild intermittent Asthma - associated with acute exacerbation - increased coughing and wheezing - may be related to current viral infection or allergic response to dog in home - Refilled albuterol  inhaler  for asthma management. - prednisone  pack - if coughing, SOB, DOB, wheezing worsen recommend seeking care through in person via urgent or emergent care  Allergic rhinitis  Potential exacerbation due to recent exposure to a long-haired dog.  Current medications include Singulair  and Zyrtec .  - Recommended rotating antihistamine nasal sprays to manage allergic symptoms. - Suggested changing Zyrtec  to Claritin or Allegra if needed.  Recommend pt has IN person visit with PCP, Dr. Gasper for chronic disease mgmt. She has active labs ordered from January 2025 that she can get drawn, which were ordered by her PCP.    Follow Up Instructions: Return in about 4 weeks (around 06/29/2024) for Needs an office visit with PCP.   I discussed the assessment and treatment plan with the patient. The patient was provided an opportunity to ask questions, and all were answered. The patient agreed with the plan and demonstrated an understanding of the instructions.   The patient was advised to call back or seek an in-person evaluation if the symptoms worsen or if the condition fails to improve as anticipated.  The above assessment and management plan was discussed with the patient. The patient verbalized understanding of and has agreed to the management plan.   Curtis DELENA Boom, FNP

## 2024-06-01 NOTE — Telephone Encounter (Signed)
 FYI Only or Action Required?: FYI only for provider: appointment scheduled on virtual on 06/01/2024.  Patient was last seen in primary care on 11/03/2023 by Gasper Nancyann BRAVO, MD.  Called Nurse Triage reporting Cough.  Symptoms began several days ago.  Interventions attempted: Other: Robitussin DM, Delsym , and leftover codeine  cough, rescue inhaler, Meloxicam , Tylenol .    Triage Disposition: See HCP Within 4 Hours (Or PCP Triage)  Patient/caregiver understands and will follow disposition?: Yes        Copied from CRM #[id number]. Topic: Clinical - Red Word Triage >> Jun 01, 2024  8:29 AM Amy B wrote: Red Word that prompted transfer to Nurse Triage: shortness of breath, chest tightness, headache, nausea Reason for Disposition  [1] MILD difficulty breathing (e.g., minimal/no SOB at rest, SOB with walking, pulse < 100) AND [2] still present when not coughing  Answer Assessment - Initial Assessment Questions Pt unable to come in for in-person appt due to starting new job. Virtual visit scheduled for 10 AM today. This RN educated pt on when to call back. Pt verbalized understanding and agrees to plan.  Pt reports intermittent productive cough, fatigue, chest pressure and runny nose since Sat; chest tightens up until uses inhaler, then improves Using rescue inhaler Fever 99-102F, taking Tylenol  and meloxicam  Robitussin DM, Delsym , and leftover codeine  cough syrup provided no relief   Requests cough syrup, antibiotic, and/or steroid. States inhaler is about to expire.  Protocols used: Cough - Acute Productive-A-AH

## 2024-06-03 ENCOUNTER — Telehealth: Payer: Self-pay

## 2024-06-03 NOTE — Telephone Encounter (Unsigned)
 Copied from CRM #[id number]. Topic: Clinical - Medical Advice >> Jun 03, 2024  4:23 PM Amy B wrote: Reason for CRM: Patient states prednisone  is not working and she requests a prescription for an antibiotic as well as a refill of cough medicine.  She is following instructions as directed by Curtis Boom.  Symptoms are the same, fever up and down.  Please advise.

## 2024-06-04 ENCOUNTER — Other Ambulatory Visit: Payer: Self-pay | Admitting: Family Medicine

## 2024-06-04 ENCOUNTER — Other Ambulatory Visit: Payer: Self-pay

## 2024-06-04 DIAGNOSIS — J019 Acute sinusitis, unspecified: Secondary | ICD-10-CM

## 2024-06-04 MED ORDER — DOXYCYCLINE HYCLATE 100 MG PO TABS
100.0000 mg | ORAL_TABLET | Freq: Two times a day (BID) | ORAL | 0 refills | Status: DC
  Filled 2024-06-04: qty 14, 7d supply, fill #0

## 2024-06-04 NOTE — Telephone Encounter (Signed)
 Sent in antibiotic. Too soon for cough medication., received 7 days worth starting 06/01/24.  She can follow up with PCP for any further concerns.

## 2024-06-04 NOTE — Telephone Encounter (Signed)
 Detailed VM left per DPR. OK for E2C2 and Triage nurse to advise per Kellie if call is returned

## 2024-06-05 ENCOUNTER — Other Ambulatory Visit: Payer: Self-pay

## 2024-06-08 ENCOUNTER — Other Ambulatory Visit: Payer: Self-pay | Admitting: Family Medicine

## 2024-06-08 ENCOUNTER — Other Ambulatory Visit: Payer: Self-pay

## 2024-06-08 DIAGNOSIS — R051 Acute cough: Secondary | ICD-10-CM

## 2024-06-11 ENCOUNTER — Other Ambulatory Visit: Payer: Self-pay

## 2024-06-11 DIAGNOSIS — G4700 Insomnia, unspecified: Secondary | ICD-10-CM | POA: Diagnosis not present

## 2024-06-11 DIAGNOSIS — F39 Unspecified mood [affective] disorder: Secondary | ICD-10-CM | POA: Diagnosis not present

## 2024-06-11 DIAGNOSIS — F410 Panic disorder [episodic paroxysmal anxiety] without agoraphobia: Secondary | ICD-10-CM | POA: Diagnosis not present

## 2024-06-11 DIAGNOSIS — F4310 Post-traumatic stress disorder, unspecified: Secondary | ICD-10-CM | POA: Diagnosis not present

## 2024-06-11 DIAGNOSIS — F411 Generalized anxiety disorder: Secondary | ICD-10-CM | POA: Diagnosis not present

## 2024-06-11 MED ORDER — CLONIDINE HCL 0.1 MG PO TABS
0.1000 mg | ORAL_TABLET | Freq: Every day | ORAL | 1 refills | Status: DC
  Filled 2024-06-11: qty 30, 30d supply, fill #0
  Filled 2024-07-07: qty 30, 30d supply, fill #1

## 2024-06-11 MED ORDER — ALPRAZOLAM 1 MG PO TABS
ORAL_TABLET | ORAL | 0 refills | Status: DC
  Filled 2024-06-23 (×2): qty 120, 30d supply, fill #0

## 2024-06-16 ENCOUNTER — Other Ambulatory Visit: Payer: Self-pay | Admitting: Family Medicine

## 2024-06-16 ENCOUNTER — Other Ambulatory Visit: Payer: Self-pay

## 2024-06-16 DIAGNOSIS — M961 Postlaminectomy syndrome, not elsewhere classified: Secondary | ICD-10-CM

## 2024-06-16 DIAGNOSIS — F1190 Opioid use, unspecified, uncomplicated: Secondary | ICD-10-CM

## 2024-06-17 ENCOUNTER — Other Ambulatory Visit: Payer: Self-pay | Admitting: Family Medicine

## 2024-06-17 ENCOUNTER — Other Ambulatory Visit: Payer: Self-pay

## 2024-06-17 DIAGNOSIS — F1190 Opioid use, unspecified, uncomplicated: Secondary | ICD-10-CM

## 2024-06-17 DIAGNOSIS — M961 Postlaminectomy syndrome, not elsewhere classified: Secondary | ICD-10-CM

## 2024-06-17 MED FILL — Hydrocodone-Acetaminophen Tab 10-325 MG: ORAL | 25 days supply | Qty: 100 | Fill #0 | Status: CN

## 2024-06-18 ENCOUNTER — Encounter: Payer: Self-pay | Admitting: Oncology

## 2024-06-18 ENCOUNTER — Encounter: Payer: Self-pay | Admitting: Family Medicine

## 2024-06-18 ENCOUNTER — Other Ambulatory Visit: Payer: Self-pay

## 2024-06-18 ENCOUNTER — Ambulatory Visit (INDEPENDENT_AMBULATORY_CARE_PROVIDER_SITE_OTHER): Payer: Self-pay | Admitting: Family Medicine

## 2024-06-18 VITALS — BP 132/77 | HR 90 | Ht 64.0 in | Wt 257.0 lb

## 2024-06-18 DIAGNOSIS — G894 Chronic pain syndrome: Secondary | ICD-10-CM | POA: Diagnosis not present

## 2024-06-18 DIAGNOSIS — F1190 Opioid use, unspecified, uncomplicated: Secondary | ICD-10-CM | POA: Diagnosis not present

## 2024-06-18 DIAGNOSIS — Z124 Encounter for screening for malignant neoplasm of cervix: Secondary | ICD-10-CM

## 2024-06-18 DIAGNOSIS — E559 Vitamin D deficiency, unspecified: Secondary | ICD-10-CM

## 2024-06-18 DIAGNOSIS — D509 Iron deficiency anemia, unspecified: Secondary | ICD-10-CM

## 2024-06-18 DIAGNOSIS — E039 Hypothyroidism, unspecified: Secondary | ICD-10-CM

## 2024-06-18 DIAGNOSIS — E538 Deficiency of other specified B group vitamins: Secondary | ICD-10-CM

## 2024-06-18 DIAGNOSIS — F430 Acute stress reaction: Secondary | ICD-10-CM | POA: Diagnosis not present

## 2024-06-18 DIAGNOSIS — E7800 Pure hypercholesterolemia, unspecified: Secondary | ICD-10-CM

## 2024-06-18 MED FILL — Hydrocodone-Acetaminophen Tab 10-325 MG: ORAL | 25 days supply | Qty: 100 | Fill #0 | Status: CN

## 2024-06-18 MED FILL — Hydrocodone-Acetaminophen Tab 10-325 MG: ORAL | 25 days supply | Qty: 100 | Fill #0 | Status: AC

## 2024-06-18 NOTE — Progress Notes (Signed)
 Established patient visit   Patient: Michaela Jones   DOB: 11/28/1975   48 y.o. Female  MRN: [id number] Visit Date: 06/18/2024  Today's healthcare provider: Nancyann Perry, MD   Chief Complaint  Patient presents with   Medical Management of Chronic Issues    Follow-up Thyroid  and anxiety   Subjective    Discussed the use of AI scribe software for clinical note transcription with the patient, who gave verbal consent to proceed.  History of Present Illness   Michaela Jones is a 48 year old female who presents for a follow-up on her medications and mental health management.  She is experiencing significant stress due to her brother-in-law and autistic nephew living in her home, which has led to a loss of her job and a decline in her mental health. 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 She is working with a therapist on sleep issues and high stress levels, engaging in journaling and cognitive-based techniques, and has participated in both inpatient and outpatient programs.  She has discontinued several medications due to adverse effects, including hallucinations similar to those experienced with trazodone . She is currently taking hydrocodone  for back pain, Xanax  for anxiety and sleep, Synthroid , and vitamins. She has stopped all antidepressants, mood stabilizers, and ADHD medications. She reports weight gain and dissatisfaction with her current state, marking 'threes' on everything because she is 'not happy'.  There is a significant family history of stress, including the loss of three family members last year, which has compounded her current situation. Her mother died at 40 from heart issues, but she does not believe there is a significant cancer risk in her family.  She reports that she has not had a mammogram. She has not had a Pap smear since her previous provider was dismissed. She has not eaten much  today, only consuming a cheese stick and a Diet Sprite with strawberries, and plans to have blood work done to check her thyroid , vitamin D , and B12 levels.     Lab Results  Component Value Date   TSH 2.311 01/28/2023   Lab Results  Component Value Date   NA 138 01/28/2023   K 3.6 01/28/2023   CREATININE 0.54 01/28/2023   GFRNONAA >60 01/28/2023   GLUCOSE 102 (H) 01/28/2023     Medications: Outpatient Medications Prior to Visit  Medication Sig Note   albuterol  (VENTOLIN  HFA) 108 (90 Base) MCG/ACT inhaler Inhale 2 puffs into the lungs every 6 (six) hours as needed for wheezing or shortness of breath.    [START ON 06/23/2024] ALPRAZolam  (XANAX ) 1 MG tablet Take 2 tablets (2 mg total) by mouth at bedtime. May also take 1 tablet (1 mg total) 2 (two) times daily as needed.    apixaban  (ELIQUIS ) 5 MG TABS tablet Take 1 tablet (5 mg total) by mouth 2 (two) times daily.    azelastine  (ASTELIN ) 0.1 % nasal spray Place 2 sprays into both nostrils 2 (two) times daily. Use in each nostril as directed    celecoxib  (CELEBREX ) 100 MG capsule Take 1 capsule (100 mg total) by mouth 2 (two) times daily as needed.    cetirizine  (ZYRTEC ) 10 MG tablet Take 1 tablet (10 mg total) by mouth daily as needed for allergies.    cholecalciferol  (VITAMIN D3) 25 MCG (1000 UNIT) tablet Take 1 tablet (1,000 Units total) by mouth daily.    cloNIDine  (CATAPRES ) 0.1 MG tablet  Take 1/2-1 tablet by mouth at bedtime.    cyanocobalamin  (VITAMIN B12) 1000 MCG/ML injection Inject 1 mL (1,000 mcg total) into the skin every 14 (fourteen) days (2 weeks) for 3 months then 1ml once a month    fluticasone  (FLONASE ) 50 MCG/ACT nasal spray Place 2 sprays into both nostrils daily.    HYDROcodone -acetaminophen  (NORCO) 10-325 MG tablet Take 1 tablet by mouth 4 (four) times daily as needed.    levothyroxine  (SYNTHROID ) 150 MCG tablet Take 1 tablet (150 mcg total) by mouth daily. PATIENT NEEDS TO SCHEDULE OFFICE VISIT FOR FOLLOW UP     montelukast  (SINGULAIR ) 10 MG tablet Take 1 tablet (10 mg total) by mouth at bedtime.    promethazine  (PHENERGAN ) 25 MG tablet Take 1 tablet (25 mg total) by mouth every 8 (eight) hours as needed for nausea or vomiting.    ALPRAZolam  (XANAX ) 1 MG tablet Take 2 tablets (2 mg total) by mouth at bedtime and take 1 tablet (1 mg total) up to 2 times daily as needed.    [DISCONTINUED] albuterol  (VENTOLIN  HFA) 108 (90 Base) MCG/ACT inhaler Inhale 2 puffs into the lungs every 6 (six) hours as needed for wheezing or shortness of breath.    [DISCONTINUED] Armodafinil  150 MG tablet Take 1 tablet (150 mg total) by mouth in the morning.    [DISCONTINUED] escitalopram  (LEXAPRO ) 20 MG tablet Take 1 tablet (20 mg total) by mouth daily.    [DISCONTINUED] Lurasidone  HCl 60 MG TABS Take 1 tablet (60 mg total) by mouth every evening. 06/18/2024: stopped by prescribing provider   [DISCONTINUED] Rimegepant Sulfate  (NURTEC) 75 MG TBDP Take 1 tablet (75 mg total) by mouth every other day.    No facility-administered medications prior to visit.      Objective    BP 132/77 (BP Location: Left Arm, Patient Position: Sitting, Cuff Size: Normal)   Pulse 90   Ht 5' 4 (1.626 m)   Wt 257 lb (116.6 kg)   SpO2 99%   BMI 44.11 kg/m   Physical Exam   General: Appearance:    Severely obese female in no acute distress  Eyes:    PERRL, conjunctiva/corneas clear, EOM's intact       Lungs:     Clear to auscultation bilaterally, respirations unlabored  Heart:    Normal heart rate. Normal rhythm. No murmurs, rubs, or gallops.    MS:   All extremities are intact.    Neurologic:   Awake, alert, oriented x 3. No apparent focal neurological defect.          Assessment & Plan        Depression and Anxiety Disorder Chronic depression and anxiety exacerbated by life stressors. Not on medication due to adverse effects. Engaged in therapy with improvement noted. - Continue therapy with [facility] for goal setting and cognitive  behavioral strategies. - Continue journaling and grounding techniques for anxiety management.  Hypothyroidism Managed with Synthroid . Blood work planned to monitor thyroid  function. - Ordered blood work to check thyroid  function.  Vitamin D  and B Vitamin Deficiency Due for vitamin D  and B12 level checks. - Ordered blood work to check vitamin D  and B12 levels.  General Health Maintenance Due for follow-up Pap smear. Previous issues with provider changes. - Set up a referral for a follow-up Pap smear.   Elevated LDL cholesterol level  - CBC - Comprehensive metabolic panel with GFR - Lipid panel  Iron  deficiency anemia, unspecified iron  deficiency anemia type  - Vitamin B12 - Iron ,  TIBC and Ferritin Panel  Vitamin D  deficiency  - VITAMIN D  25 Hydroxy (Vit-D Deficiency, Fractures)  Chronic pain disorder Adequately controlled on current medication. No sign of abuse or diversion  Chronic, continuous use of opioids  Cervical cancer screening  - Ambulatory referral to Obstetrics / Gynecology          Nancyann Perry, MD  [facility] [phone] (phone) [phone] (fax)  [facility]

## 2024-06-19 LAB — CBC
Hematocrit: 40.1 % (ref 34.0–46.6)
Hemoglobin: 13.2 g/dL (ref 11.1–15.9)
MCH: 31.8 pg (ref 26.6–33.0)
MCHC: 32.9 g/dL (ref 31.5–35.7)
MCV: 97 fL (ref 79–97)
Platelets: 335 x10E3/uL (ref 150–450)
RBC: 4.15 x10E6/uL (ref 3.77–5.28)
RDW: 13.4 % (ref 11.7–15.4)
WBC: 8.2 x10E3/uL (ref 3.4–10.8)

## 2024-06-19 LAB — COMPREHENSIVE METABOLIC PANEL WITH GFR
ALT: 18 IU/L (ref 0–32)
AST: 26 IU/L (ref 0–40)
Albumin: 4.6 g/dL (ref 3.9–4.9)
Alkaline Phosphatase: 79 IU/L (ref 41–116)
BUN/Creatinine Ratio: 20 (ref 9–23)
BUN: 16 mg/dL (ref 6–24)
Bilirubin Total: 0.4 mg/dL (ref 0.0–1.2)
CO2: 19 mmol/L — ABNORMAL LOW (ref 20–29)
Calcium: 9.3 mg/dL (ref 8.7–10.2)
Chloride: 104 mmol/L (ref 96–106)
Creatinine, Ser: 0.79 mg/dL (ref 0.57–1.00)
Globulin, Total: 2.6 g/dL (ref 1.5–4.5)
Glucose: 79 mg/dL (ref 70–99)
Potassium: 4.8 mmol/L (ref 3.5–5.2)
Sodium: 142 mmol/L (ref 134–144)
Total Protein: 7.2 g/dL (ref 6.0–8.5)
eGFR: 92 mL/min/1.73 (ref >59)

## 2024-06-19 LAB — TSH+FREE T4
Free T4: 0.94 ng/dL (ref 0.82–1.77)
TSH: 4.96 u[IU]/mL — ABNORMAL HIGH (ref 0.450–4.500)

## 2024-06-19 LAB — LIPID PANEL
Chol/HDL Ratio: 3.2 ratio (ref 0.0–4.4)
Cholesterol, Total: 308 mg/dL — ABNORMAL HIGH (ref 100–199)
HDL: 96 mg/dL (ref >39)
LDL Chol Calc (NIH): 160 mg/dL — ABNORMAL HIGH (ref 0–99)
Triglycerides: 283 mg/dL — ABNORMAL HIGH (ref 0–149)
VLDL Cholesterol Cal: 52 mg/dL — ABNORMAL HIGH (ref 5–40)

## 2024-06-19 LAB — IRON,TIBC AND FERRITIN PANEL
Ferritin: 18 ng/mL (ref 15–150)
Iron Saturation: 9 % — CL (ref 15–55)
Iron: 50 ug/dL (ref 27–159)
Total Iron Binding Capacity: 546 ug/dL — ABNORMAL HIGH (ref 250–450)
UIBC: 496 ug/dL — ABNORMAL HIGH (ref 131–425)

## 2024-06-19 LAB — VITAMIN B12: Vitamin B-12: 253 pg/mL (ref 232–1245)

## 2024-06-19 LAB — VITAMIN D 25 HYDROXY (VIT D DEFICIENCY, FRACTURES): Vit D, 25-Hydroxy: 7.7 ng/mL — ABNORMAL LOW (ref 30.0–100.0)

## 2024-06-21 ENCOUNTER — Other Ambulatory Visit: Payer: Self-pay | Admitting: Family Medicine

## 2024-06-22 ENCOUNTER — Ambulatory Visit: Payer: Self-pay | Admitting: Family Medicine

## 2024-06-22 ENCOUNTER — Other Ambulatory Visit: Payer: Self-pay

## 2024-06-22 DIAGNOSIS — E785 Hyperlipidemia, unspecified: Secondary | ICD-10-CM | POA: Insufficient documentation

## 2024-06-22 MED ORDER — PROMETHAZINE HCL 25 MG PO TABS
25.0000 mg | ORAL_TABLET | Freq: Three times a day (TID) | ORAL | 2 refills | Status: AC | PRN
  Filled 2024-06-22: qty 30, 10d supply, fill #0
  Filled 2024-08-25: qty 30, 10d supply, fill #1

## 2024-06-23 ENCOUNTER — Other Ambulatory Visit: Payer: Self-pay

## 2024-07-01 ENCOUNTER — Telehealth

## 2024-07-01 ENCOUNTER — Ambulatory Visit: Admitting: Psychiatry

## 2024-07-01 DIAGNOSIS — F411 Generalized anxiety disorder: Secondary | ICD-10-CM | POA: Diagnosis not present

## 2024-07-01 NOTE — Progress Notes (Signed)
 [facility] Counselor/Therapist Progress Note  Patient ID: Michaela Jones, MRN: [id number],    Date: 07/01/2024  Time Spent: 43 minutes start time 4:04 PM end time 4:47 PM Virtual Visit via Video Note Connected with patient by a telemedicine/telehealth application, with their informed consent, and verified patient privacy and that I am speaking with the correct person using two identifiers. I discussed the limitations, risks, security and privacy concerns of performing psychotherapy and the availability of in person appointments. I also discussed with the patient that there may be a patient responsible charge related to this service. The patient expressed understanding and agreed to proceed. I discussed the treatment planning with the patient. The patient was provided an opportunity to ask questions and all were answered. The patient agreed with the plan and demonstrated an understanding of the instructions. The patient was advised to call  our office if  symptoms worsen or feel they are in a crisis state and need immediate contact.   Therapist Location: office Patient Location: home    Treatment Type: Individual Therapy  Reported Symptoms: anxiety, sadness, panic, health issues, sleep issues, focusing issues  Mental Status Exam:  Appearance:   Casual     Behavior:  Appropriate  Motor:  Normal  Speech/Language:   Normal Rate  Affect:  Congruent  Mood:  anxious  Thought process:  circumstantial  Thought content:    WNL  Sensory/Perceptual disturbances:    WNL  Orientation:  oriented to person, place, time/date, and situation  Attention:  Good  Concentration:  Good  Memory:  WNL  Fund of knowledge:   Fair  Insight:    Fair  Judgment:   Good  Impulse Control:  Good   Risk Assessment: Danger to Self:  No Self-injurious Behavior: No Danger to Others: No Duty to Warn:no Physical Aggression / Violence:No  Access to Firearms a concern: No  Gang Involvement:No   Subjective:  Met with patient via virtual session. She shared that nothing has changed at her home. She went on to share that they have had another death in the family and that is hard. She went on to share that her brother in law has moved a few things out but he is still there.  She went on to share that her husband has the flu and now her son has it. She went on to share that there are still lots of issues with her nephew and brother in law. She went on to share there are issues with her daughter that are concerning for her. She went on to share that she is 76 and has her own therapist. She shared she is feeling better about the court issue and it got continued until January. She shared that her blood work came back bad and she has been concerned about the information. Her vitamin D  is very low as well as her iron . Her thyroid  was borderline even though she is on medication. She shared she has been trying to go for walks some times. She went on to share she is trying to implement plans from previous sessions. She reported that they are going to try and have family meetings once Michaela Jones is out of the house and start building what they used to do prior to him moving in with them. 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  Encouraged patient to continue taking things 1 step  at a time.  Reminded her she still has to see what happens when her brother-in-law finally does move out.  Encouraged her to continue working with her husband on their relationship and being able to set good limits with brother-in-law.  Interventions: Solution-Oriented/Positive Psychology  Diagnosis:   ICD-10-CM   1. Generalized anxiety disorder  F41.1       Plan: Patient is to practice DBT, CBT and coping skills to decrease her anxiety.  Patient is to talk with her husband about the importance of getting his brother and his son out of their home and not take on other  peoples stuff.  Patient is to work on practicing breathing techniques and trying to get out in the sun on a regular basis. Patient is to see her provider Michaela Jones, PMHNP for medication concerns. She is to see other providers for other medical issues.   Michaela Jones, [facility]

## 2024-07-05 ENCOUNTER — Telehealth: Admitting: Family Medicine

## 2024-07-05 ENCOUNTER — Other Ambulatory Visit: Payer: Self-pay

## 2024-07-05 DIAGNOSIS — Z20828 Contact with and (suspected) exposure to other viral communicable diseases: Secondary | ICD-10-CM | POA: Diagnosis not present

## 2024-07-05 DIAGNOSIS — R051 Acute cough: Secondary | ICD-10-CM

## 2024-07-05 DIAGNOSIS — R6889 Other general symptoms and signs: Secondary | ICD-10-CM

## 2024-07-05 MED ORDER — BENZONATATE 100 MG PO CAPS
100.0000 mg | ORAL_CAPSULE | Freq: Three times a day (TID) | ORAL | 0 refills | Status: AC | PRN
  Filled 2024-07-05: qty 30, 10d supply, fill #0

## 2024-07-05 MED ORDER — OSELTAMIVIR PHOSPHATE 75 MG PO CAPS
75.0000 mg | ORAL_CAPSULE | Freq: Two times a day (BID) | ORAL | 0 refills | Status: AC
  Filled 2024-07-05: qty 10, 5d supply, fill #0

## 2024-07-05 NOTE — Progress Notes (Signed)
 E visit for Flu like symptoms   We are sorry that you are not feeling well.  Here is how we plan to help! Based on what you have shared with me it looks like you may have possible exposure to a virus that causes influenza.  Influenza or "the flu" is  an infection caused by a respiratory virus. The flu virus is highly contagious and persons who did not receive their yearly flu vaccination may "catch" the flu from close contact.  We have anti-viral medications to treat the viruses that cause this infection. They are not a "cure" and only shorten the course of the infection. These prescriptions are most effective when they are given within the first 2 days of "flu" symptoms. Antiviral medications are indicated if you have a high risk of complications from the flu. You should  also consider an antiviral medication if you are in close contact with someone who is at risk. These medications can help patients avoid complications from the flu but have side effects that you should know.   Possible side effects from Tamiflu  or oseltamivir  include nausea, vomiting, diarrhea, dizziness, headaches, eye redness, sleep problems or other respiratory symptoms. You should not take Tamiflu  if you have an allergy to oseltamivir  or any to the ingredients in Tamiflu .  Based upon your symptoms and potential risk factors I have prescribed Oseltamivir  (Tamiflu ).  It has been sent to your designated pharmacy.  You will take one 75 mg capsule orally twice a day for the next 5 days.   For nasal congestion, you may use an oral decongestant such as Mucinex  D or if you have glaucoma or high blood pressure use plain Mucinex .  Saline nasal spray or nasal drops can help and can safely be used as often as needed for congestion.  If you have a sore or scratchy throat, use a saltwater gargle-  to  teaspoon of salt dissolved in a 4-ounce to 8-ounce glass of warm water.  Gargle the solution for approximately 15-30 seconds and then spit.   It is important not to swallow the solution.  You can also use throat lozenges/cough drops and Chloraseptic spray to help with throat pain or discomfort.  Warm or cold liquids can also be helpful in relieving throat pain.  For headache, pain or general discomfort, you can use Ibuprofen or Tylenol  as directed.   Some authorities believe that zinc sprays or the use of Echinacea may shorten the course of your symptoms.  I have prescribed the following medications to help lessen symptoms: I have prescribed Tessalon  Perles 100 mg. You may take 1-2 capsules every 8 hours as needed for cough  You are to isolate at home until you have been fever-free for at least 24 hours without a fever-reducing medication, and symptoms have been steadily improving for 24 hours.  If you must be around other household members who do not have symptoms, you need to make sure that both you and the family members are masking consistently with a high-quality mask.  If you note any worsening of symptoms despite treatment, please seek an in-person evaluation ASAP. If you note any significant shortness of breath or any chest pain, please seek ED evaluation. Please do not delay care!  ANYONE WHO HAS FLU SYMPTOMS SHOULD: Stay home. The flu is highly contagious and going out or to work exposes others! Be sure to drink plenty of fluids. Water is fine as well as fruit juices, sodas and electrolyte beverages. You may want to  stay away from caffeine or alcohol . If you are nauseated, try taking small sips of liquids. How do you know if you are getting enough fluid? Your urine should be a pale yellow or almost colorless. Get rest. Taking a steamy shower or using a humidifier may help nasal congestion and ease sore throat pain. Using a saline nasal spray works much the same way. Cough drops, hard candies and sore throat lozenges may ease your cough. Line up a caregiver. Have someone check on you regularly.  GET HELP RIGHT AWAY IF: You  cannot keep down liquids or your medications. You become short of breath Your fell like you are going to pass out or loose consciousness. Your symptoms persist after you have completed your treatment plan  MAKE SURE YOU  Understand these instructions. Will watch your condition. Will get help right away if you are not doing well or get worse.  Your e-visit answers were reviewed by a board certified advanced clinical practitioner to complete your personal care plan.  Depending on the condition, your plan could have included both over the counter or prescription medications.  If there is a problem please reply  once you have received a response from your provider.  Your safety is important to us .  If you have drug allergies check your prescription carefully.    You can use MyChart to ask questions about today's visit, request a non-urgent call back, or ask for a work or school excuse for 24 hours related to this e-Visit. If it has been greater than 24 hours you will need to follow up with your provider, or enter a new e-Visit to address those concerns.  You will get an e-mail in the next two days asking about your experience.  I hope that your e-visit has been valuable and will speed your recovery. Thank you for using e-visits.   I have spent 5 minutes in review of e-visit questionnaire, review and updating patient chart, medical decision making and response to patient.   Chiquita CHRISTELLA Barefoot, NP

## 2024-07-06 ENCOUNTER — Telehealth: Payer: Self-pay

## 2024-07-06 ENCOUNTER — Other Ambulatory Visit: Payer: Self-pay

## 2024-07-06 MED ORDER — PROMETHAZINE-DM 6.25-15 MG/5ML PO SYRP
5.0000 mL | ORAL_SOLUTION | Freq: Four times a day (QID) | ORAL | 0 refills | Status: AC | PRN
  Filled 2024-07-06: qty 118, 6d supply, fill #0

## 2024-07-06 NOTE — Addendum Note (Signed)
 Addended by: GLADIS ELSIE BROCKS on: 07/06/2024 02:43 PM   Modules accepted: Orders

## 2024-07-06 NOTE — Telephone Encounter (Signed)
 Patient was advised to check with the provider that was treating her and if she had any difficulty to call us  back

## 2024-07-06 NOTE — Telephone Encounter (Signed)
 Copied from CRM [id number]. Topic: Clinical - Medical Advice >> Jul 05, 2024  2:14 PM Amber H wrote: Reason for CRM: Patient called and stated she was seen at an e-visit with [facility] via telehealth today and was prescribed oseltamivir  (TAMIFLU ) 75 MG capsule and benzonatate  (TESSALON ) 100 MG capsule however, she stated she wanted something else for her symptoms due to Tessalon  not working for her in the past. Has flu-like symps.   [phone]

## 2024-07-11 ENCOUNTER — Other Ambulatory Visit: Payer: Self-pay | Admitting: Family Medicine

## 2024-07-11 ENCOUNTER — Other Ambulatory Visit: Payer: Self-pay

## 2024-07-11 DIAGNOSIS — M961 Postlaminectomy syndrome, not elsewhere classified: Secondary | ICD-10-CM

## 2024-07-11 DIAGNOSIS — F1190 Opioid use, unspecified, uncomplicated: Secondary | ICD-10-CM

## 2024-07-11 MED ORDER — HYDROCODONE-ACETAMINOPHEN 10-325 MG PO TABS
1.0000 | ORAL_TABLET | Freq: Four times a day (QID) | ORAL | 0 refills | Status: DC | PRN
  Filled 2024-07-11: qty 100, 25d supply, fill #0

## 2024-07-12 ENCOUNTER — Other Ambulatory Visit: Payer: Self-pay

## 2024-07-16 ENCOUNTER — Other Ambulatory Visit: Payer: Self-pay

## 2024-07-16 DIAGNOSIS — F411 Generalized anxiety disorder: Secondary | ICD-10-CM | POA: Diagnosis not present

## 2024-07-16 DIAGNOSIS — F4310 Post-traumatic stress disorder, unspecified: Secondary | ICD-10-CM | POA: Diagnosis not present

## 2024-07-16 DIAGNOSIS — G4700 Insomnia, unspecified: Secondary | ICD-10-CM | POA: Diagnosis not present

## 2024-07-16 DIAGNOSIS — F410 Panic disorder [episodic paroxysmal anxiety] without agoraphobia: Secondary | ICD-10-CM | POA: Diagnosis not present

## 2024-07-16 DIAGNOSIS — F39 Unspecified mood [affective] disorder: Secondary | ICD-10-CM | POA: Diagnosis not present

## 2024-07-16 MED ORDER — ALPRAZOLAM 1 MG PO TABS
4.0000 mg | ORAL_TABLET | Freq: Every day | ORAL | 1 refills | Status: DC
  Filled 2024-07-21: qty 120, 30d supply, fill #0

## 2024-07-16 MED ORDER — CLONIDINE HCL 0.1 MG PO TABS
0.0500 mg | ORAL_TABLET | Freq: Every day | ORAL | 0 refills | Status: DC
  Filled 2024-07-16 – 2024-08-03 (×4): qty 90, 90d supply, fill #0

## 2024-07-19 ENCOUNTER — Other Ambulatory Visit: Payer: Self-pay

## 2024-07-21 ENCOUNTER — Other Ambulatory Visit: Payer: Self-pay

## 2024-07-21 ENCOUNTER — Encounter: Payer: Self-pay | Admitting: Oncology

## 2024-07-24 ENCOUNTER — Encounter: Payer: Self-pay | Admitting: Nurse Practitioner

## 2024-07-24 ENCOUNTER — Telehealth: Admitting: Nurse Practitioner

## 2024-07-25 ENCOUNTER — Other Ambulatory Visit: Payer: Self-pay | Admitting: Family Medicine

## 2024-07-25 DIAGNOSIS — M961 Postlaminectomy syndrome, not elsewhere classified: Secondary | ICD-10-CM

## 2024-07-25 DIAGNOSIS — F1190 Opioid use, unspecified, uncomplicated: Secondary | ICD-10-CM

## 2024-07-26 ENCOUNTER — Other Ambulatory Visit: Payer: Self-pay

## 2024-07-26 ENCOUNTER — Other Ambulatory Visit: Payer: Self-pay | Admitting: Family Medicine

## 2024-07-26 DIAGNOSIS — F1190 Opioid use, unspecified, uncomplicated: Secondary | ICD-10-CM

## 2024-07-26 DIAGNOSIS — M961 Postlaminectomy syndrome, not elsewhere classified: Secondary | ICD-10-CM

## 2024-07-27 ENCOUNTER — Ambulatory Visit: Payer: Self-pay

## 2024-07-27 NOTE — Telephone Encounter (Signed)
 FYI Only or Action Required?: FYI only for provider: ED advised.  Patient was last seen in primary care on 06/18/2024 by Gasper Nancyann BRAVO, MD.  Called Nurse Triage reporting Leg Pain.  Symptoms began about a month ago.  Interventions attempted: Prescription medications: prescribed hydrocodone -acetaminophen  (not helping), Rest, hydration, or home remedies, and Ice/heat application.  Symptoms are: gradually worsening.  Triage Disposition: See HCP Within 4 Hours (Or PCP Triage)  Patient/caregiver understands and will follow disposition?: Yes           Copied from CRM #[id number]. Topic: Clinical - Red Word Triage >> Jul 27, 2024  2:44 PM Joesph B wrote: Red Word that prompted transfer to Nurse Triage:   Blood clot on back of leg, can only walk on her left leg. Causing severe pain. Reason for Disposition  [1] Thigh or calf pain AND [2] only 1 side AND [3] present > 1 hour  (Exception: Chronic unchanged pain.)  Answer Assessment - Initial Assessment Questions Patient states that she has Factor 5 Patient states that she is having pain in her left calf--patient states there is some heat in this area. Patient states it hurts to put weight on it. Patient denies any known injuries. She states she can walk on it but she is limping. She states that she had a series of blood clots in her inner thighs. Patient denies any chance of pregnancy. She denies any difficulty breathing or chest pain at this time during triage. Patient states that she is on Eliquis . Patient is advised that it is recommended that she be seen and evaluated today within the next four hours---No openings at PCP office or any offices in the surrounding region so patient is advised Urgent Care or the Emergency Room.  She is advised that the Emergency Room is preferred due to the imaging capabilities. Patient is agreeable with this due to past history with blood clots. She states she will get her husband to take her to the  Emergency Room.  Patient is advised to call us  back if anything changes or with any further questions/concerns. Patient is advised that if anything worsens to go to call 911. Patient verbalized understanding.  Protocols used: Leg Pain-A-AH

## 2024-07-28 ENCOUNTER — Other Ambulatory Visit: Payer: Self-pay | Admitting: Family Medicine

## 2024-07-28 ENCOUNTER — Other Ambulatory Visit: Payer: Self-pay

## 2024-07-28 DIAGNOSIS — M961 Postlaminectomy syndrome, not elsewhere classified: Secondary | ICD-10-CM

## 2024-07-28 DIAGNOSIS — F1190 Opioid use, unspecified, uncomplicated: Secondary | ICD-10-CM

## 2024-07-28 NOTE — Telephone Encounter (Signed)
 Requested medication (s) are due for refill today: Yes  Requested medication (s) are on the active medication list: Yes  Last refill:  07/11/24  Future visit scheduled: No  Notes to clinic:  Unable to refuse due to non-delegated medicine. Routing to office for refusal.       Requested Prescriptions  Pending Prescriptions Disp Refills   HYDROcodone -acetaminophen  (NORCO) 10-325 MG tablet 100 tablet 0    Sig: Take 1 tablet by mouth 4 (four) times daily as needed.     Not Delegated - Analgesics:  Opioid Agonist Combinations Failed - 07/28/2024  4:46 PM      Failed - This refill cannot be delegated      Failed - Urine Drug Screen completed in last 360 days      Passed - Valid encounter within last 3 months    Recent Outpatient Visits           1 month ago B12 deficiency   [facility] Gasper Nancyann BRAVO, MD   1 month ago Acute cough   [facility] [place], Curtis A, FNP   8 months ago Obesity, unspecified class, unspecified obesity type, unspecified whether serious comorbidity present   [facility] Gasper Nancyann BRAVO, MD   10 months ago Influenza-like illness   [facility] [place], Jon HERO, MD

## 2024-07-30 ENCOUNTER — Other Ambulatory Visit: Payer: Self-pay

## 2024-07-30 MED FILL — Hydrocodone-Acetaminophen Tab 10-325 MG: ORAL | 25 days supply | Qty: 100 | Fill #0 | Status: CN

## 2024-08-03 ENCOUNTER — Ambulatory Visit: Payer: Self-pay

## 2024-08-03 ENCOUNTER — Emergency Department
Admission: EM | Admit: 2024-08-03 | Discharge: 2024-08-03 | Disposition: A | Discharge status: Home / Self Care | Attending: Emergency Medicine | Admitting: Emergency Medicine

## 2024-08-03 ENCOUNTER — Emergency Department

## 2024-08-03 ENCOUNTER — Other Ambulatory Visit: Payer: Self-pay

## 2024-08-03 DIAGNOSIS — R079 Chest pain, unspecified: Secondary | ICD-10-CM | POA: Diagnosis present

## 2024-08-03 DIAGNOSIS — R109 Unspecified abdominal pain: Secondary | ICD-10-CM | POA: Insufficient documentation

## 2024-08-03 DIAGNOSIS — Z7901 Long term (current) use of anticoagulants: Secondary | ICD-10-CM | POA: Diagnosis not present

## 2024-08-03 DIAGNOSIS — E039 Hypothyroidism, unspecified: Secondary | ICD-10-CM | POA: Insufficient documentation

## 2024-08-03 DIAGNOSIS — R197 Diarrhea, unspecified: Secondary | ICD-10-CM | POA: Insufficient documentation

## 2024-08-03 LAB — DIFFERENTIAL
Abs Immature Granulocytes: 0.02 K/uL (ref 0.00–0.07)
Basophils Absolute: 0.1 K/uL (ref 0.0–0.1)
Basophils Relative: 1 %
Eosinophils Absolute: 0.2 K/uL (ref 0.0–0.5)
Eosinophils Relative: 3 %
Immature Granulocytes: 0 %
Lymphocytes Relative: 45 %
Lymphs Abs: 3.5 K/uL (ref 0.7–4.0)
Monocytes Absolute: 0.5 K/uL (ref 0.1–1.0)
Monocytes Relative: 7 %
Neutro Abs: 3.6 K/uL (ref 1.7–7.7)
Neutrophils Relative %: 44 %

## 2024-08-03 LAB — BASIC METABOLIC PANEL WITH GFR
Anion gap: 13 (ref 5–15)
BUN: 18 mg/dL (ref 6–20)
CO2: 22 mmol/L (ref 22–32)
Calcium: 8.7 mg/dL — ABNORMAL LOW (ref 8.9–10.3)
Chloride: 107 mmol/L (ref 98–111)
Creatinine, Ser: 0.65 mg/dL (ref 0.44–1.00)
GFR, Estimated: >60 mL/min
Glucose, Bld: 98 mg/dL (ref 70–99)
Potassium: 3.8 mmol/L (ref 3.5–5.1)
Sodium: 142 mmol/L (ref 135–145)

## 2024-08-03 LAB — HEPATIC FUNCTION PANEL
ALT: 21 U/L (ref 0–44)
AST: 35 U/L (ref 15–41)
Albumin: 4.2 g/dL (ref 3.5–5.0)
Alkaline Phosphatase: 82 U/L (ref 38–126)
Bilirubin, Direct: 0.1 mg/dL (ref 0.0–0.2)
Indirect Bilirubin: 0.2 mg/dL — ABNORMAL LOW (ref 0.3–0.9)
Total Bilirubin: 0.4 mg/dL (ref 0.0–1.2)
Total Protein: 7.1 g/dL (ref 6.5–8.1)

## 2024-08-03 LAB — LIPASE, BLOOD: Lipase: 17 U/L (ref 11–51)

## 2024-08-03 LAB — CBC
HCT: 38.3 % (ref 36.0–46.0)
Hemoglobin: 12.6 g/dL (ref 12.0–15.0)
MCH: 31.9 pg (ref 26.0–34.0)
MCHC: 32.9 g/dL (ref 30.0–36.0)
MCV: 97 fL (ref 80.0–100.0)
Platelets: 302 K/uL (ref 150–400)
RBC: 3.95 MIL/uL (ref 3.87–5.11)
RDW: 13.9 % (ref 11.5–15.5)
WBC: 7.9 K/uL (ref 4.0–10.5)
nRBC: 0 % (ref 0.0–0.2)

## 2024-08-03 LAB — POC URINE PREG, ED: Preg Test, Ur: NEGATIVE

## 2024-08-03 LAB — TROPONIN T, HIGH SENSITIVITY: Troponin T High Sensitivity: <15 ng/L (ref 0–19)

## 2024-08-03 MED ORDER — IOHEXOL 350 MG/ML SOLN
100.0000 mL | Freq: Once | INTRAVENOUS | Status: AC | PRN
  Administered 2024-08-03: 100 mL via INTRAVENOUS

## 2024-08-03 NOTE — ED Notes (Signed)
 See triage note  Presents with some SOB  States this started 2 days ago   Denies any fever  Also has had some disocmfort in her legs  She is worried about blood clots   Currently taking blood thiners

## 2024-08-03 NOTE — Telephone Encounter (Signed)
 FYI Only or Action Required?: FYI only for provider: ED advised.  Patient was last seen in primary care on 06/18/2024 by Gasper Nancyann BRAVO, MD.  Called Nurse Triage reporting Chest Pain.  Symptoms began several days ago.  Interventions attempted: Nothing.  Symptoms are: unchanged.  Triage Disposition: Go to ED Now (Notify PCP)  Patient/caregiver understands and will follow disposition?: Yes   Copied from CRM [id number]. Topic: Clinical - Red Word Triage >> Aug 03, 2024 11:18 AM Olam RAMAN wrote: Red Word that prompted transfer to Nurse Triage: pain under rib (R) For about 3 days   Reason for Disposition  History of inherited increased risk of blood clots (e.g., Factor 5 Leiden, Anti-thrombin 3, Protein C or Protein S deficiency, Prothrombin mutation)  Answer Assessment - Initial Assessment Questions Advised ED now and someone needs to drive. Patient reports will have someone to drive or wait until tonight when husband gets off. Advised ED now, offered to call 911, patient declines and reports I have 3 drivers that could take me.  Advised 911 if symptoms worsen: severe diff breathing, faint, chest pain >5 min. Patient verbalized understanding.  1. LOCATION: Where does it hurt?       Right rib, deep breath makes worse, diarrhea Feels like gas bubble; tried Tums 2. RADIATION: Does the pain go anywhere else? (e.g., into neck, jaw, arms, back)     Under ribs, does not radiate 3. ONSET: When did the chest pain begin? (Minutes, hours or days)      08/01/24 4. PATTERN: Does the pain come and go, or has it been constant since it started?  Does it get worse with exertion?      Comes and goes with breathing 5. DURATION: How long does it last (e.g., seconds, minutes, hours)     ongoing 6. SEVERITY: How bad is the pain?  (e.g., Scale 1-10; mild, moderate, or severe)     8/10 when breathing in, only hurts when breathing in 7. CARDIAC RISK FACTORS: Do you have any history of heart  problems or risk factors for heart disease? (e.g., angina, prior heart attack; diabetes, high blood pressure, high cholesterol, smoker, or strong family history of heart disease)     no 8. PULMONARY RISK FACTORS: Do you have any history of lung disease?  (e.g., blood clots in lung, asthma, emphysema, birth control pills)     Factor V, blood clots in leg; takes Eliquis  9. CAUSE: What do you think is causing the chest pain?     Blood clot 10. OTHER SYMPTOMS: Do you have any other symptoms? (e.g., dizziness, nausea, vomiting, sweating, fever, difficulty breathing, cough) Pain in left leg weeks ago, [place] few days ago, reports fell on knees only, leg hurting, 08/01/24; pain under rib.  Denies diff breathing, chest pain, faint, weakness, fever chills n/v  Protocols used: Chest Pain-A-AH

## 2024-08-03 NOTE — ED Provider Notes (Signed)
 "  [facility] Provider Note    Event Date/Time   First MD Initiated Contact with Patient 08/03/24 1307     (approximate)   History   Shortness of Breath   HPI  Michaela Jones is a 49 y.o. female with a past medical history of gastric bypass, morbid obesity, factor V Leiden on Eliquis  on anticoagulation who presents today for evaluation of chest pain.  Patient reports that her symptoms began several days ago.  She reports that her pain is right-sided and worse with deep inspiration.  Her symptoms began on 08/01/24.  She also reports abdominal cramping and diarrhea.  She has had a history of a cholecystectomy.  No change in her pain with eating.  Patient Active Problem List   Diagnosis Date Noted   Hyperlipidemia 06/22/2024   Auditory hallucination 02/06/2023   Morbid obesity (HCC) 12/04/2022   Sciatica of left side 11/11/2022   B12 deficiency 05/27/2022   DVT of deep femoral vein (HCC) 05/26/2020   Chronic pain disorder 05/16/2020   Chronic, continuous use of opioids 05/16/2020   Anxiety 06/17/2018   Lumbar postlaminectomy syndrome 10/23/2017   Lumbosacral radiculopathy 10/23/2017   Vitamin D  deficiency 03/01/2015   Acquired spondylolisthesis 03/01/2015   Elevated LDL cholesterol level 03/01/2015   Chronic constipation 03/01/2015   Dysthymia 11/11/2013   Absolute anemia 10/15/2012   Gastric bypass status for obesity 10/15/2012   Insomnia 09/03/2007   Allergic rhinitis 06/15/2007   Iron  deficiency anemia 01/22/2007   Restless leg 09/23/2006   Headache, migraine 08/27/2006   Acquired lymphedema 07/29/1998   Adult hypothyroidism 07/29/1992          Physical Exam   Triage Vital Signs: ED Triage Vitals  Encounter Vitals Group     BP 08/03/24 1255 120/86     Girls Systolic BP Percentile --      Girls Diastolic BP Percentile --      Boys Systolic BP Percentile --      Boys Diastolic BP Percentile --      Pulse Rate 08/03/24 1255 86     Resp  08/03/24 1255 18     Temp 08/03/24 1255 97.8 F (36.6 C)     Temp Source 08/03/24 1255 Oral     SpO2 08/03/24 1255 96 %     Weight 08/03/24 1256 250 lb (113.4 kg)     Height 08/03/24 1256 5' 4 (1.626 m)     Head Circumference --      Peak Flow --      Pain Score 08/03/24 1256 9     Pain Loc --      Pain Education --      Exclude from Growth Chart --     Most recent vital signs: Vitals:   08/03/24 1255 08/03/24 1705  BP: 120/86 122/80  Pulse: 86 80  Resp: 18 18  Temp: 97.8 F (36.6 C) 98 F (36.7 C)  SpO2: 96% 96%    Physical Exam Vitals and nursing note reviewed.  Constitutional:      General: Awake and alert. No acute distress.    Appearance: Normal appearance. The patient is normal weight.  HENT:     Head: Normocephalic and atraumatic.     Mouth: Mucous membranes are moist.  Eyes:     General: PERRL. Normal EOMs        Right eye: No discharge.        Left eye: No discharge.     Conjunctiva/sclera: Conjunctivae  normal.  Cardiovascular:     Rate and Rhythm: Normal rate and regular rhythm.     Pulses: Normal pulses.  Pulmonary:     Effort: Pulmonary effort is normal. No respiratory distress.     Breath sounds: Normal breath sounds.  Mild chest wall tenderness Abdominal:     Abdomen is soft. There is mild epigastric/lower rib tenderness. No rebound or guarding. No distention. Musculoskeletal:        General: No swelling. Normal range of motion.     Cervical back: Normal range of motion and neck supple.  Lymphedema bilaterally, no skin changes Skin:    General: Skin is warm and dry.     Capillary Refill: Capillary refill takes less than 2 seconds.     Findings: No rash.  Neurological:     Mental Status: The patient is awake and alert.      ED Results / Procedures / Treatments   Labs (all labs ordered are listed, but only abnormal results are displayed) Labs Reviewed  BASIC METABOLIC PANEL WITH GFR - Abnormal; Notable for the following components:       Result Value   Calcium 8.7 (*)    All other components within normal limits  HEPATIC FUNCTION PANEL - Abnormal; Notable for the following components:   Indirect Bilirubin 0.2 (*)    All other components within normal limits  CBC  DIFFERENTIAL  LIPASE, BLOOD  URINALYSIS, ROUTINE W REFLEX MICROSCOPIC  POC URINE PREG, ED  TROPONIN T, HIGH SENSITIVITY  TROPONIN T, HIGH SENSITIVITY     EKG     RADIOLOGY I independently reviewed and interpreted imaging and agree with radiologists findings.     PROCEDURES:  Critical Care performed:   Procedures   MEDICATIONS ORDERED IN ED: Medications  iohexol  (OMNIPAQUE ) 350 MG/ML injection 100 mL (100 mLs Intravenous Contrast Given 08/03/24 1541)     IMPRESSION / MDM / ASSESSMENT AND PLAN / ED COURSE  I reviewed the triage vital signs and the nursing notes.   Differential diagnosis includes, but is not limited to, pulmonary embolism, pleurisy, pancreatitis, marginal ulcer.  Patient is awake and alert, hemodynamically stable and afebrile.  She is nontoxic in appearance.  I reviewed the patient's chart.  She has a history of factor V Leiden and is on Eliquis .  Follows closely with her outpatient provider  Further workup is indicated.  Labs obtained are overall reassuring including negative troponin, normal LFTs and lipase, normal H&H, no leukocytosis.  Given her pleurisy and her known clotting disorder, CT angiogram obtained for evaluation of pulmonary embolism.  CT of her abdomen and pelvis was also obtained given her upper abdominal pain.  CT scans are overall reassuring.  Patient was reevaluated multiple times throughout her emergency department stay with improvement of her symptoms.  She did not require any analgesia.  We discussed symptomatic management and return precautions.  Patient understands and agrees with plan.  She was discharged in stable condition.   Patient's presentation is most consistent with acute presentation with  potential threat to life or bodily function.   FINAL CLINICAL IMPRESSION(S) / ED DIAGNOSES   Final diagnoses:  Abdominal pain, unspecified abdominal location  Chest pain, unspecified type     Rx / DC Orders   ED Discharge Orders     None        Note:  This document was prepared using Dragon voice recognition software and may include unintentional dictation errors.   Lacresia Darwish E, PA-C 08/03/24 [phone]  Bradler, Evan K, MD 08/03/24 2317  "

## 2024-08-03 NOTE — ED Triage Notes (Signed)
 Pt presents to the ED via POV from home with [facility] x2 days. Pt reports known DVT's and reports that she is on a blood thinner. Pt reports right sided chest pain. Pt A&Ox4. Ambulatory to triage room.

## 2024-08-03 NOTE — Discharge Instructions (Addendum)
 Please follow-up with your outpatient provider.  Please return for any new, worsening, or changing symptoms or other concerns.  It was a pleasure caring for you today.

## 2024-08-04 ENCOUNTER — Other Ambulatory Visit: Payer: Self-pay

## 2024-08-04 MED FILL — Hydrocodone-Acetaminophen Tab 10-325 MG: ORAL | 25 days supply | Qty: 100 | Fill #0 | Status: AC

## 2024-08-05 ENCOUNTER — Other Ambulatory Visit: Payer: Self-pay

## 2024-08-05 MED ORDER — CLONIDINE HCL 0.1 MG PO TABS
0.0500 mg | ORAL_TABLET | Freq: Every day | ORAL | 0 refills | Status: AC
  Filled 2024-08-05: qty 90, 90d supply, fill #0

## 2024-08-06 ENCOUNTER — Other Ambulatory Visit: Payer: Self-pay

## 2024-08-06 ENCOUNTER — Ambulatory Visit: Payer: Self-pay | Admitting: Family Medicine

## 2024-08-06 DIAGNOSIS — M5417 Radiculopathy, lumbosacral region: Secondary | ICD-10-CM

## 2024-08-06 MED ORDER — METHOCARBAMOL 500 MG PO TABS
500.0000 mg | ORAL_TABLET | Freq: Four times a day (QID) | ORAL | 0 refills | Status: AC | PRN
  Filled 2024-08-06: qty 60, 8d supply, fill #0

## 2024-08-06 NOTE — Telephone Encounter (Signed)
 Copied from CRM [id number]. Topic: Clinical - Red Word Triage >> Aug 06, 2024 12:47 PM Michaela Jones wrote: Calling for follow up, needing something for back. She realized she threw the back by picking up several packs of pop at once.

## 2024-08-06 NOTE — Addendum Note (Signed)
 Addended by: GASPER NANCYANN BRAVO on: 08/06/2024 04:23 PM   Modules accepted: Orders

## 2024-08-06 NOTE — Telephone Encounter (Signed)
 Pt called again and spoke with Reena regarding if Dr. Gasper will be sending in something for her back by the end of today? As she not able to walk due to her pain.   Message was routed to Dr Gasper he advised I can send in a prescription for a muscle relaxer, but she needs to go to ER if that doesn't help.  Called pt and advised pt stated she went to ER already but not for injury. Pt advised and verbalized understanding.

## 2024-08-06 NOTE — Telephone Encounter (Signed)
 FYI Only or Action Required?: Action required by provider: clinical question for provider, update on patient condition, and patient requesting PCP advice and/or medication similar to a muscle relaxer.  Patient was last seen in primary care on 06/18/2024 by Michaela Nancyann BRAVO, MD.  Called Nurse Triage reporting Back Pain.  Symptoms began yesterday.  Interventions attempted: Prescription medications: hydrocodone -acetaminophen  10-325mg , Rest, hydration, or home remedies, and Ice/heat application.  Symptoms are: rapidly worsening.  Triage Disposition: See HCP Within 4 Hours (Or PCP Triage)  Patient/caregiver understands and will follow disposition?: No, wishes to speak with PCP                     Copied from CRM [id number]. Topic: Clinical - Red Word Triage >> Aug 06, 2024  7:51 AM Emylou G wrote: [facility] that prompted transfer to Nurse Triage: back is seized up - spasms bad.. can't drive Reason for Disposition  [1] SEVERE back pain (e.g., excruciating, unable to do any normal activities) AND [2] not improved 2 hours after pain medicine  Answer Assessment - Initial Assessment Questions Patient states she has an upcoming office visit scheduled with her PCP [name] She states that she was helping someone move and since then her back has been hurting and having spasms. She is not sure if she may have overdone it or lifted something incorrectly. Patient noticed last night when she got ready for bed that she was feeling sore in her back. Patient states that she was just in the ER. Patient states history of blood clots--abdominal pain and chest pain were noted at that ER visit. Patient states she is trying to get something like a muscle relaxer. Patient states she cannot stand up straight or walk without any discomfort. Patient has a history of back surgeries and today her pain is isolated to her middle/lower back. Patient has been taking hydrocodone -acetaminophen  10-325mg  as  prescribed, used a heating pad, tried different positions. Patient is advised that it is recommended that she gets seen and evaluated in the next four hours. No chance of pregnancy per patient.  Patient is offered an appointment with another provider at another location within the region. Patient states that she only wants to see [name]. She states that she has had a flare up in the past and [name] has prescribed a muscle relaxer and a steroid pack and she states she is not trying to get any strong pain medications--she states she would just like something like a muscle relaxer to help with the back spasms she believes she is feeling.  She denies any new urinary problems, bowel movement changes, having severe weakness in one lower extremity to a point where she has to drag that limb, abdominal pain, or chest pain at this time. Patient is advised Urgent Care also but she states that the last time she went to Urgent Care they did not have the equipment needed to help her. She just wants a message sent to [name] at this point.  Patient is advised to call us  back if anything changes or with any further questions/concerns. Patient is advised that if anything worsens to go to the Emergency Room. Patient verbalized understanding.  Protocols used: Back Pain-A-AH

## 2024-08-10 ENCOUNTER — Other Ambulatory Visit: Payer: Self-pay

## 2024-08-11 ENCOUNTER — Emergency Department
Admission: EM | Admit: 2024-08-11 | Discharge: 2024-08-11 | Disposition: A | Discharge status: Home / Self Care | Attending: Emergency Medicine | Admitting: Emergency Medicine

## 2024-08-11 ENCOUNTER — Other Ambulatory Visit: Payer: Self-pay

## 2024-08-11 ENCOUNTER — Emergency Department

## 2024-08-11 ENCOUNTER — Encounter: Payer: Self-pay | Admitting: Emergency Medicine

## 2024-08-11 DIAGNOSIS — R197 Diarrhea, unspecified: Secondary | ICD-10-CM | POA: Insufficient documentation

## 2024-08-11 DIAGNOSIS — S01319A Laceration without foreign body of unspecified ear, initial encounter: Secondary | ICD-10-CM

## 2024-08-11 DIAGNOSIS — X58XXXA Exposure to other specified factors, initial encounter: Secondary | ICD-10-CM | POA: Diagnosis not present

## 2024-08-11 DIAGNOSIS — S0991XA Unspecified injury of ear, initial encounter: Secondary | ICD-10-CM | POA: Diagnosis present

## 2024-08-11 DIAGNOSIS — I82409 Acute embolism and thrombosis of unspecified deep veins of unspecified lower extremity: Secondary | ICD-10-CM | POA: Insufficient documentation

## 2024-08-11 DIAGNOSIS — Z7901 Long term (current) use of anticoagulants: Secondary | ICD-10-CM | POA: Insufficient documentation

## 2024-08-11 DIAGNOSIS — R55 Syncope and collapse: Secondary | ICD-10-CM | POA: Insufficient documentation

## 2024-08-11 DIAGNOSIS — S01311A Laceration without foreign body of right ear, initial encounter: Secondary | ICD-10-CM | POA: Insufficient documentation

## 2024-08-11 LAB — CBC WITH DIFFERENTIAL/PLATELET
Abs Immature Granulocytes: 0.17 K/uL — ABNORMAL HIGH (ref 0.00–0.07)
Basophils Absolute: 0.1 K/uL (ref 0.0–0.1)
Basophils Relative: 1 %
Eosinophils Absolute: 0 K/uL (ref 0.0–0.5)
Eosinophils Relative: 0 %
HCT: 36.5 % (ref 36.0–46.0)
Hemoglobin: 11.7 g/dL — ABNORMAL LOW (ref 12.0–15.0)
Immature Granulocytes: 2 %
Lymphocytes Relative: 12 %
Lymphs Abs: 0.9 K/uL (ref 0.7–4.0)
MCH: 31.7 pg (ref 26.0–34.0)
MCHC: 32.1 g/dL (ref 30.0–36.0)
MCV: 98.9 fL (ref 80.0–100.0)
Monocytes Absolute: 0.6 K/uL (ref 0.1–1.0)
Monocytes Relative: 8 %
Neutro Abs: 5.8 K/uL (ref 1.7–7.7)
Neutrophils Relative %: 77 %
Platelets: 285 K/uL (ref 150–400)
RBC: 3.69 MIL/uL — ABNORMAL LOW (ref 3.87–5.11)
RDW: 14.7 % (ref 11.5–15.5)
WBC: 7.5 K/uL (ref 4.0–10.5)
nRBC: 0.4 % — ABNORMAL HIGH (ref 0.0–0.2)

## 2024-08-11 LAB — COMPREHENSIVE METABOLIC PANEL WITH GFR
ALT: 49 U/L — ABNORMAL HIGH (ref 0–44)
AST: 53 U/L — ABNORMAL HIGH (ref 15–41)
Albumin: 3.9 g/dL (ref 3.5–5.0)
Alkaline Phosphatase: 94 U/L (ref 38–126)
Anion gap: 16 — ABNORMAL HIGH (ref 5–15)
BUN: 8 mg/dL (ref 6–20)
CO2: 19 mmol/L — ABNORMAL LOW (ref 22–32)
Calcium: 9 mg/dL (ref 8.9–10.3)
Chloride: 103 mmol/L (ref 98–111)
Creatinine, Ser: 0.61 mg/dL (ref 0.44–1.00)
GFR, Estimated: >60 mL/min
Glucose, Bld: 143 mg/dL — ABNORMAL HIGH (ref 70–99)
Potassium: 3.6 mmol/L (ref 3.5–5.1)
Sodium: 138 mmol/L (ref 135–145)
Total Bilirubin: 0.4 mg/dL (ref 0.0–1.2)
Total Protein: 6.9 g/dL (ref 6.5–8.1)

## 2024-08-11 LAB — MAGNESIUM: Magnesium: 2.5 mg/dL — ABNORMAL HIGH (ref 1.7–2.4)

## 2024-08-11 MED ORDER — CEPHALEXIN 500 MG PO CAPS
500.0000 mg | ORAL_CAPSULE | Freq: Once | ORAL | Status: AC
  Administered 2024-08-11: 500 mg via ORAL
  Filled 2024-08-11: qty 1

## 2024-08-11 MED ORDER — LIDOCAINE HCL (PF) 1 % IJ SOLN
10.0000 mL | Freq: Once | INTRAMUSCULAR | Status: AC
  Administered 2024-08-11: 10 mL
  Filled 2024-08-11: qty 10

## 2024-08-11 MED ORDER — DIAZEPAM 5 MG/ML IJ SOLN
10.0000 mg | Freq: Once | INTRAMUSCULAR | Status: AC
  Administered 2024-08-11: 10 mg via INTRAVENOUS
  Filled 2024-08-11: qty 2

## 2024-08-11 MED ORDER — CEPHALEXIN 500 MG PO CAPS
500.0000 mg | ORAL_CAPSULE | Freq: Four times a day (QID) | ORAL | 0 refills | Status: AC
  Filled 2024-08-11: qty 20, 5d supply, fill #0

## 2024-08-11 MED ORDER — DIAZEPAM 5 MG PO TABS
5.0000 mg | ORAL_TABLET | Freq: Three times a day (TID) | ORAL | 0 refills | Status: AC | PRN
  Filled 2024-08-11: qty 9, 3d supply, fill #0

## 2024-08-11 MED ORDER — LORAZEPAM 2 MG/ML IJ SOLN: 2.0000 mg | Freq: Once | INTRAMUSCULAR | Status: DC

## 2024-08-11 NOTE — Discharge Instructions (Addendum)
 To check on your ear healing, you may call Dr. Herminio who is an ear nose and throat specialist OR  Dr. Lowery who is a engineer, petroleum.  The type of stitches that you got are dissolvable meaning they will dissolve and come out on their own after approximately 1 week.  Please keep your wound clean by washing at least daily with soap and water.  Apply antibiotic ointment daily. If you see any signs of infection like spreading redness, pus coming from the wound, extreme pain, fevers, chills or any other worsening doctor right away or come back to the emergency department   Take antibiotics as prescribed.  It appears that you ran out of your Xanax  early.  You must talk with your prescriber about getting a refill.  I do not think your symptoms are due to a seizure due to withdrawal because you have been about 7 days out from your last Xanax .  Take the prescribed Valium  to help with sleep.  I gave you a very short course of this medication because I want you to meet with your primary provider for further prescriptions as needed.  Do not take this medication with any other benzodiazepines like Xanax  or alcohol .  I am not sure what caused you to pass out in the bathroom but it may be the result of a vasovagal reaction-we checked other things like your electrolytes and your blood, your EKG heart rhythms, and these were normal.  Thank you for choosing us  for your health care today!  Please see your primary doctor this week for a follow up appointment.   If you have any new, worsening, or unexpected symptoms call your doctor right away or come back to the emergency department for reevaluation.  It was my pleasure to care for you today.   Ginnie EDISON Cyrena, MD

## 2024-08-11 NOTE — ED Provider Notes (Signed)
 "  [facility] Provider Note    Event Date/Time   First MD Initiated Contact with Patient 08/11/24 0413     (approximate)   History   Laceration   HPI  Michaela Jones is a 49 y.o. female   Past medical history of chronic back pain chronic opioids, clotting disorder/DVT/factor V Leiden on Eliquis , anxiety on Xanax  here with syncopal episode and ear injury.  She states that she has been constipated over the last couple of days and has been straining on the toilet trying to make bowel movements and last night was able to make 2 liquid bowel movements.  During the second bowel movement she recalls straining on the toilet and then waking up on the ground with blood all around her.  She noticed that her right ear had a significant laceration.  She has never had a seizure.  Of note she takes Xanax  for anxiety but has run out early due to taking more than she is prescribed but her last dose was over 1 week ago.  She denies drinking alcohol  or using any other drugs in the interim.  Due to not having her Xanax  she has had difficulty sleeping over the last several days.  When she awoke from her syncopal episode she did not note tongue bite or incontinence.  She denies abdominal pain.  No chest pain or shortness of breath.  No palpitations.  She has pain in her right ear, right side of the head, and right shoulder.   External Medical Documents Reviewed: Previous outpatient notes      Physical Exam   Triage Vital Signs: ED Triage Vitals  Encounter Vitals Group     BP 08/11/24 0332 (!) 144/95     Girls Systolic BP Percentile --      Girls Diastolic BP Percentile --      Boys Systolic BP Percentile --      Boys Diastolic BP Percentile --      Pulse Rate 08/11/24 0332 (!) 127     Resp 08/11/24 0332 17     Temp 08/11/24 0332 98.3 F (36.8 C)     Temp Source 08/11/24 0332 Oral     SpO2 08/11/24 0332 99 %     Weight 08/11/24 0332 250 lb (113.4 kg)     Height  08/11/24 0332 5' 7 (1.702 m)     Head Circumference --      Peak Flow --      Pain Score 08/11/24 0345 7     Pain Loc --      Pain Education --      Exclude from Growth Chart --     Most recent vital signs: Vitals:   08/11/24 0332 08/11/24 0626  BP: (!) 144/95 138/71  Pulse: (!) 127 79  Resp: 17 16  Temp: 98.3 F (36.8 C) 98 F (36.7 C)  SpO2: 99% 97%    General: Awake, no distress.  CV:  Good peripheral perfusion.  Resp:  Normal effort.  Abd:  No distention.  Other:  Complicated laceration of the right ear with cartilage exposure.  No hemotympanum.  No significant signs of head trauma otherwise.  Neck supple full range of motion.  Able to range at the affected right shoulder neurovascular intact.  Remainder of head to toe examination reveals no acute traumatic injuries.  Her abdomen is soft and nontender.  She has no intraoral lesion  She has no tongue fasciculation or hand tremors but is  slightly tachycardic.  Awake alert oriented cooperative mentation is sharp.   ED Results / Procedures / Treatments   Labs (all labs ordered are listed, but only abnormal results are displayed) Labs Reviewed  COMPREHENSIVE METABOLIC PANEL WITH GFR - Abnormal; Notable for the following components:      Result Value   CO2 19 (*)    Glucose, Bld 143 (*)    AST 53 (*)    ALT 49 (*)    Anion gap 16 (*)    All other components within normal limits  CBC WITH DIFFERENTIAL/PLATELET - Abnormal; Notable for the following components:   RBC 3.69 (*)    Hemoglobin 11.7 (*)    nRBC 0.4 (*)    Abs Immature Granulocytes 0.17 (*)    All other components within normal limits  MAGNESIUM - Abnormal; Notable for the following components:   Magnesium 2.5 (*)    All other components within normal limits     I ordered and reviewed the above labs they are notable for cell count and electrolytes largely unremarkable.  EKG  ED ECG REPORT I, Ginnie Shams, the attending physician, personally viewed and  interpreted this ECG.   Date: 08/11/2024  EKG Time: 0430  Rate: 71  Rhythm: sinus  Axis: nl  Intervals:nl  ST&T Change: no stemi    RADIOLOGY I independently reviewed and interpreted CT of the head and see no obvious bleeding or midline shift I also reviewed radiologist's formal read.   PROCEDURES:  Critical Care performed: No  .Laceration Repair  Date/Time: 08/11/2024 6:29 AM  Performed by: Shams Ginnie, MD Authorized by: Shams Ginnie, MD   Consent:    Consent obtained:  Verbal   Consent given by:  Patient   Risks discussed:  Infection, poor wound healing, poor cosmetic result and need for additional repair   Alternatives discussed:  No treatment Universal protocol:    Procedure explained and questions answered to patient or proxy's satisfaction: yes     Patient identity confirmed:  Verbally with patient Anesthesia:    Anesthesia method:  Local infiltration   Local anesthetic:  Lidocaine  1% w/o epi Laceration details:    Location:  Ear   Ear location:  R ear   Length (cm):  6   Depth (mm):  2 Exploration:    Hemostasis achieved with:  Direct pressure   Wound extent: no foreign body     Wound extent comment:  Cartilage damage, cartilage exposure   Contaminated: no   Treatment:    Area cleansed with:  Soap and water   Irrigation solution:  Sterile saline   Irrigation method:  Syringe   Debridement:  Minimal Skin repair:    Repair method:  Sutures   Suture size:  5-0   Wound skin closure material used: Vicryl Rapide.   Suture technique:  Running   Number of sutures:  12 Approximation:    Approximation:  Close Repair type:    Repair type:  Simple Post-procedure details:    Dressing:  Open (no dressing)   Procedure completion:  Tolerated    MEDICATIONS ORDERED IN ED: Medications  diazepam  (VALIUM ) injection 10 mg (10 mg Intravenous Given 08/11/24 0439)  lidocaine  (PF) (XYLOCAINE ) 1 % injection 10 mL (10 mLs Other Given by Other 08/11/24 0600)  cephALEXin   (KEFLEX ) capsule 500 mg (500 mg Oral Given 08/11/24 0524)    IMPRESSION / MDM / ASSESSMENT AND PLAN / ED COURSE  I reviewed the triage vital signs and the nursing notes.  Patient's presentation is most consistent with acute presentation with potential threat to life or bodily function.  Differential diagnosis includes, but is not limited to, syncope, vasovagal syncope, orthostatic hypotension due to dehydration in the setting of diarrhea, seizure, withdrawal, head injury, ear injury, ICH or skull fracture, shoulder dislocation or fracture, contusion, dysrhythmia or electrolyte derangements    MDM:    In terms of her syncopal episode most likely vasovagal in the setting of straining on the toilet.  I considered orthostatic hypotension in the setting of diarrheal illness, but she  appears euvolemic overall, no significant renal dysfunction, and electrolytes are unremarkable.  Considered dysrhythmia but no palpitations reported by patient, normal EKG.  Also considered seizure but no history of the same.  Considered Xanax  withdrawal but outside of the window for most acute symptoms including seizure given over 1 week without her last dose.  She has been dealing with more anxiety in the setting of not having her Xanax .  Given her anxiety today, will give a dose of Valium  and a very short prescription course of a few pills to tide her over until she is able to meet with her prescriber to discuss her benzodiazepine prescription.  for her traumatic injuries, complex ear repair as above.  Check CT head given her anticoagulated status with head strike fortunately looks negative CT head and neck.  Right shoulder pain likely contusion or ligamentous injury as she is able to fully range and x-ray is negative.  In terms of her constipation/diarrheal illness, benign abdominal exam rules against surgical abdominal pathologies.  She has been stable and comfortable during her  stay in the ED.    I considered hospitalization for admission or observation however given unremarkable workup as above, patient with adequate follow-up, plan will be for discharge now with return precautions and follow-up with PMD this week.        FINAL CLINICAL IMPRESSION(S) / ED DIAGNOSES   Final diagnoses:  Complex laceration of ear, initial encounter  Syncope, unspecified syncope type  Diarrhea, unspecified type     Rx / DC Orders   ED Discharge Orders          Ordered    cephALEXin  (KEFLEX ) 500 MG capsule  4 times daily        08/11/24 0455    diazepam  (VALIUM ) 5 MG tablet  Every 8 hours PRN        08/11/24 0455             Note:  This document was prepared using Dragon voice recognition software and may include unintentional dictation errors.    Cyrena Mylar, MD 08/11/24 [phone]  "

## 2024-08-11 NOTE — ED Notes (Signed)
 Overheard patient husband say to wife ill do the talking.

## 2024-08-11 NOTE — ED Triage Notes (Addendum)
 Pt sitting in triage room; initially pt's son is speaking for pt stating we don't know what happened over and over; pt's son told by this nurse to let pt speak for herself to explain her symptoms; pt st she cut her ear but st doesn't know how it happened; pt with large laceration with avulsion of rt earlobe; saline soaked 4x4 gauze given to pt to hold to site; pt st she woke to go to BR and then there was blood everywhere; asked pt if she had passed out and st I don't think so; pt c/o pain to rt shoulder as well but again st doesn't know why; pt st she was here 2 days ago for not feeling well but does not know what testing was done or what dx she received; upon review of pt's chart, hx of factor V Leiden and currently taking eliquis 

## 2024-08-12 ENCOUNTER — Other Ambulatory Visit: Payer: Self-pay

## 2024-08-12 MED ORDER — ASENAPINE MALEATE 5 MG SL SUBL
5.0000 mg | SUBLINGUAL_TABLET | Freq: Every day | SUBLINGUAL | 0 refills | Status: AC
  Filled 2024-08-12: qty 30, 30d supply, fill #0

## 2024-08-13 ENCOUNTER — Other Ambulatory Visit: Payer: Self-pay

## 2024-08-16 ENCOUNTER — Other Ambulatory Visit: Payer: Self-pay

## 2024-08-18 ENCOUNTER — Other Ambulatory Visit: Payer: Self-pay

## 2024-08-18 MED ORDER — GENTAMICIN SULFATE 0.1 % EX OINT
1.0000 | TOPICAL_OINTMENT | Freq: Three times a day (TID) | CUTANEOUS | 0 refills | Status: AC
  Filled 2024-08-18: qty 30, 10d supply, fill #0

## 2024-08-23 ENCOUNTER — Other Ambulatory Visit: Payer: Self-pay

## 2024-08-23 MED ORDER — RAMELTEON 8 MG PO TABS
8.0000 mg | ORAL_TABLET | Freq: Every day | ORAL | 1 refills | Status: AC
  Filled 2024-08-23 – 2024-08-24 (×2): qty 30, 30d supply, fill #0

## 2024-08-24 ENCOUNTER — Other Ambulatory Visit: Payer: Self-pay | Admitting: Family Medicine

## 2024-08-24 ENCOUNTER — Other Ambulatory Visit: Payer: Self-pay

## 2024-08-24 ENCOUNTER — Ambulatory Visit: Admitting: Psychiatry

## 2024-08-24 DIAGNOSIS — M961 Postlaminectomy syndrome, not elsewhere classified: Secondary | ICD-10-CM

## 2024-08-24 DIAGNOSIS — F1190 Opioid use, unspecified, uncomplicated: Secondary | ICD-10-CM

## 2024-08-24 MED ORDER — HYDROCODONE-ACETAMINOPHEN 10-325 MG PO TABS
1.0000 | ORAL_TABLET | Freq: Four times a day (QID) | ORAL | 0 refills | Status: AC | PRN
  Filled 2024-08-24 – 2024-08-28 (×9): qty 100, 25d supply, fill #0

## 2024-08-25 ENCOUNTER — Other Ambulatory Visit: Payer: Self-pay

## 2024-08-26 ENCOUNTER — Other Ambulatory Visit: Payer: Self-pay

## 2024-08-27 ENCOUNTER — Other Ambulatory Visit: Payer: Self-pay

## 2024-08-28 ENCOUNTER — Other Ambulatory Visit: Payer: Self-pay

## 2024-08-31 ENCOUNTER — Ambulatory Visit: Admitting: Psychiatry

## 2024-08-31 DIAGNOSIS — F411 Generalized anxiety disorder: Secondary | ICD-10-CM

## 2024-08-31 NOTE — Progress Notes (Signed)
 "       [facility] Counselor/Therapist Progress Note  Patient ID: Michaela Jones, MRN: [id number],    Date: 08/31/2024  Time Spent: 61 minutes start time 11:52 PM end time 12:53 PM Virtual Visit via Video Note Connected with patient by a telemedicine/telehealth application, with their informed consent, and verified patient privacy and that I am speaking with the correct person using two identifiers. I discussed the limitations, risks, security and privacy concerns of performing psychotherapy and the availability of in person appointments. I also discussed with the patient that there may be a patient responsible charge related to this service. The patient expressed understanding and agreed to proceed. I discussed the treatment planning with the patient. The patient was provided an opportunity to ask questions and all were answered. The patient agreed with the plan and demonstrated an understanding of the instructions. The patient was advised to call  our office if  symptoms worsen or feel they are in a crisis state and need immediate contact.   Therapist Location: home Patient Location: home    Treatment Type: Individual Therapy  Reported Symptoms: anxiety, panic, sleep issue, triggered responses, rumination, physical issues, focusing issues, low motivation, fatigue  Mental Status Exam:  Appearance:   Casual     Behavior:  Sharing  Motor:  Restlestness  Speech/Language:   Normal Rate  Affect:  Appropriate  Mood:  anxious  Thought process:  circumstantial  Thought content:    WNL  Sensory/Perceptual disturbances:    WNL  Orientation:  oriented to person, place, time/date, and situation  Attention:  Good  Concentration:  Good  Memory:  Immediate;   Fair  [employer] of knowledge:   Fair  Insight:    Fair  Judgment:   Fair  Impulse Control:  Good   Risk Assessment: Danger to Self:  No Self-injurious Behavior: No Danger to Others: No Duty to Warn:no Physical Aggression / Violence:No   Access to Firearms a concern: No  Gang Involvement:No   Subjective: Met with patient via virtual session. She shared that she did get her brother in law and nephew out of their home. She went on to share that her daughter and husband got in a fight and she moved out of the house. She went on to share that she is running out of money and they are finding out that there are some concerns about what she is doing. She has been to the ER twice. She went on to share that it took until the end of December to get them out.  She shared she had to have a tooth pulled which was triggering for her. She went on to explain she cut her ear that night and went to the ER due to a cut. She also had to go due to dehydration. Patient shared she is trying to keep her ear clean and putting ointment on it.She is also doing better with drinking water. She went on to share that she is having a hard time getting things done since the house seems overwhelming to her. She has lost things and is upset about that.  She shared she has been trying to use some of her coping skills like TIPP and focus on the facts. Patient stated that the issues with her daughter was what was bothering the most. Did processing set on the issue. Hearing them yelling SUDS 9 negative belief I'm becoming my mom, felt anxiety and sadness in her chest and feet. Patient was able to reduce SUDS  level to 4.  Through processing patient was able to realize she has to let go of other people's choices and realize that she has to work on herself and give herself permission not to fix other people.  Discussed the importance of practicing no in the mirror knowing that her brother-in-law is going to ask her for money help something in the near future and her job is to practicing no.  Interventions: Eye Movement Desensitization and Reprocessing (EMDR) and Insight-Oriented  Diagnosis:   ICD-10-CM   1. Generalized anxiety disorder  F41.1       Plan: Patient is to  practice DBT, CBT and coping skills to decrease her anxiety.  Patient is to practice saying no and the mirror.  Patient is to remind herself she needs to focus on herself and what she can control fix and change and she does not need to fix other people.  Patient is to work on practicing breathing techniques and trying to get out in the sun on a regular basis. Patient is to see her provider Harlene Pepper, PMHNP for medication concerns. She is to see other providers for other medical issues.     Silvano Pacini, [facility]                   "

## 2024-09-07 ENCOUNTER — Ambulatory Visit: Admitting: Psychiatry

## 2024-09-14 ENCOUNTER — Ambulatory Visit: Admitting: Psychiatry

## 2024-09-21 ENCOUNTER — Ambulatory Visit: Admitting: Psychiatry

## 2024-09-27 ENCOUNTER — Encounter: Admitting: Registered Nurse

## 2024-09-28 ENCOUNTER — Ambulatory Visit: Admitting: Psychiatry

## 2024-10-05 ENCOUNTER — Ambulatory Visit: Admitting: Psychiatry

## 2024-10-12 ENCOUNTER — Ambulatory Visit: Admitting: Psychiatry
